# Patient Record
Sex: Female | Born: 1935 | Race: Black or African American | Hispanic: No | Smoking: Never smoker
Health system: Southern US, Community
[De-identification: ages and names within clinical notes are randomized; demographics above are authoritative.]

## PROBLEM LIST (undated history)

## (undated) DIAGNOSIS — I4891 Unspecified atrial fibrillation: Principal | ICD-10-CM

## (undated) DIAGNOSIS — I10 Essential (primary) hypertension: Secondary | ICD-10-CM

## (undated) DIAGNOSIS — E7800 Pure hypercholesterolemia, unspecified: Secondary | ICD-10-CM

## (undated) DIAGNOSIS — H409 Unspecified glaucoma: Secondary | ICD-10-CM

## (undated) DIAGNOSIS — I739 Peripheral vascular disease, unspecified: Secondary | ICD-10-CM

## (undated) DIAGNOSIS — I5022 Chronic systolic (congestive) heart failure: Secondary | ICD-10-CM

## (undated) DIAGNOSIS — E119 Type 2 diabetes mellitus without complications: Secondary | ICD-10-CM

## (undated) HISTORY — PX: EYE SURGERY: SHX253

## (undated) MED ORDER — ZOLEDRONIC ACID 5 MG/100ML IV SOLN
5.0000 mg | Freq: Once | INTRAVENOUS | Status: AC
Start: 2022-11-11 — End: 2022-11-11

## (undated) MED ORDER — SODIUM CHLORIDE 0.9 % IV BOLUS
250.0000 mL | INJECTION | Freq: Once | INTRAVENOUS | Status: AC
Start: 2022-11-11 — End: 2022-11-11

---

## 2012-07-04 ENCOUNTER — Encounter (INDEPENDENT_AMBULATORY_CARE_PROVIDER_SITE_OTHER): Payer: Medicare Other | Admitting: Ophthalmology

## 2012-07-04 DIAGNOSIS — H4010X0 Unspecified open-angle glaucoma, stage unspecified: Secondary | ICD-10-CM

## 2012-07-04 DIAGNOSIS — E1165 Type 2 diabetes mellitus with hyperglycemia: Secondary | ICD-10-CM

## 2012-07-04 DIAGNOSIS — E11319 Type 2 diabetes mellitus with unspecified diabetic retinopathy without macular edema: Secondary | ICD-10-CM

## 2012-07-04 DIAGNOSIS — H43819 Vitreous degeneration, unspecified eye: Secondary | ICD-10-CM

## 2012-07-04 DIAGNOSIS — H3581 Retinal edema: Secondary | ICD-10-CM

## 2012-07-04 DIAGNOSIS — I10 Essential (primary) hypertension: Secondary | ICD-10-CM

## 2012-07-04 DIAGNOSIS — H35039 Hypertensive retinopathy, unspecified eye: Secondary | ICD-10-CM

## 2012-11-01 ENCOUNTER — Ambulatory Visit (INDEPENDENT_AMBULATORY_CARE_PROVIDER_SITE_OTHER): Payer: Self-pay | Admitting: Ophthalmology

## 2013-03-30 ENCOUNTER — Emergency Department (HOSPITAL_COMMUNITY): Payer: Medicare Other

## 2013-03-30 ENCOUNTER — Inpatient Hospital Stay (HOSPITAL_COMMUNITY)
Admission: EM | Admit: 2013-03-30 | Discharge: 2013-04-04 | DRG: 308 | Disposition: A | Discharge status: Home Health | Payer: Medicare Other | Attending: Cardiology | Admitting: Cardiology

## 2013-03-30 ENCOUNTER — Encounter (HOSPITAL_COMMUNITY): Payer: Self-pay | Admitting: Emergency Medicine

## 2013-03-30 DIAGNOSIS — I4729 Other ventricular tachycardia: Secondary | ICD-10-CM | POA: Diagnosis present

## 2013-03-30 DIAGNOSIS — I4891 Unspecified atrial fibrillation: Principal | ICD-10-CM | POA: Diagnosis present

## 2013-03-30 DIAGNOSIS — Z87891 Personal history of nicotine dependence: Secondary | ICD-10-CM

## 2013-03-30 DIAGNOSIS — I509 Heart failure, unspecified: Secondary | ICD-10-CM | POA: Diagnosis present

## 2013-03-30 DIAGNOSIS — Z794 Long term (current) use of insulin: Secondary | ICD-10-CM

## 2013-03-30 DIAGNOSIS — E119 Type 2 diabetes mellitus without complications: Secondary | ICD-10-CM | POA: Diagnosis present

## 2013-03-30 DIAGNOSIS — I472 Ventricular tachycardia, unspecified: Secondary | ICD-10-CM | POA: Diagnosis present

## 2013-03-30 DIAGNOSIS — E876 Hypokalemia: Secondary | ICD-10-CM | POA: Diagnosis not present

## 2013-03-30 DIAGNOSIS — I5021 Acute systolic (congestive) heart failure: Secondary | ICD-10-CM

## 2013-03-30 DIAGNOSIS — I429 Cardiomyopathy, unspecified: Secondary | ICD-10-CM

## 2013-03-30 DIAGNOSIS — I10 Essential (primary) hypertension: Secondary | ICD-10-CM | POA: Diagnosis present

## 2013-03-30 DIAGNOSIS — Z79899 Other long term (current) drug therapy: Secondary | ICD-10-CM

## 2013-03-30 DIAGNOSIS — I428 Other cardiomyopathies: Secondary | ICD-10-CM | POA: Diagnosis present

## 2013-03-30 DIAGNOSIS — E785 Hyperlipidemia, unspecified: Secondary | ICD-10-CM | POA: Diagnosis present

## 2013-03-30 DIAGNOSIS — I739 Peripheral vascular disease, unspecified: Secondary | ICD-10-CM | POA: Diagnosis present

## 2013-03-30 DIAGNOSIS — H409 Unspecified glaucoma: Secondary | ICD-10-CM | POA: Diagnosis present

## 2013-03-30 DIAGNOSIS — I4821 Permanent atrial fibrillation: Secondary | ICD-10-CM

## 2013-03-30 HISTORY — DX: Peripheral vascular disease, unspecified: I73.9

## 2013-03-30 HISTORY — DX: Pure hypercholesterolemia, unspecified: E78.00

## 2013-03-30 HISTORY — DX: Unspecified atrial fibrillation: I48.91

## 2013-03-30 HISTORY — DX: Unspecified glaucoma: H40.9

## 2013-03-30 HISTORY — DX: Essential (primary) hypertension: I10

## 2013-03-30 HISTORY — DX: Type 2 diabetes mellitus without complications: E11.9

## 2013-03-30 HISTORY — DX: Chronic systolic (congestive) heart failure: I50.22

## 2013-03-30 LAB — CBC
HCT: 32.6 % — ABNORMAL LOW (ref 36.0–46.0)
MCH: 26.8 pg (ref 26.0–34.0)
MCV: 80.9 fL (ref 78.0–100.0)
Platelets: 213 10*3/uL (ref 150–400)
RBC: 4.03 MIL/uL (ref 3.87–5.11)
WBC: 4.3 10*3/uL (ref 4.0–10.5)

## 2013-03-30 LAB — HEPARIN LEVEL (UNFRACTIONATED): Heparin Unfractionated: 0.51 IU/mL (ref 0.30–0.70)

## 2013-03-30 LAB — TROPONIN I
Troponin I: <0.3 ng/mL (ref <0.30)
Troponin I: <0.3 ng/mL (ref <0.30)

## 2013-03-30 LAB — MAGNESIUM: Magnesium: 0.9 mg/dL — CL (ref 1.5–2.5)

## 2013-03-30 LAB — COMPREHENSIVE METABOLIC PANEL
ALT: 46 U/L — ABNORMAL HIGH (ref 0–35)
AST: 28 U/L (ref 0–37)
Albumin: 3.3 g/dL — ABNORMAL LOW (ref 3.5–5.2)
BUN: 22 mg/dL (ref 6–23)
CO2: 19 mEq/L (ref 19–32)
Calcium: 8.4 mg/dL (ref 8.4–10.5)
Creatinine, Ser: 1.1 mg/dL (ref 0.50–1.10)
GFR calc Af Amer: 53 mL/min — ABNORMAL LOW (ref >90)
GFR calc non Af Amer: 45 mL/min — ABNORMAL LOW (ref >90)
Sodium: 143 mEq/L (ref 135–145)
Total Bilirubin: 0.6 mg/dL (ref 0.3–1.2)
Total Protein: 6.3 g/dL (ref 6.0–8.3)

## 2013-03-30 LAB — GLUCOSE, CAPILLARY: Glucose-Capillary: 110 mg/dL — ABNORMAL HIGH (ref 70–99)

## 2013-03-30 LAB — POCT I-STAT TROPONIN I: Troponin i, poc: 0.01 ng/mL (ref 0.00–0.08)

## 2013-03-30 LAB — PROTIME-INR: INR: 1.1 (ref 0.00–1.49)

## 2013-03-30 LAB — TSH: TSH: 1.19 u[IU]/mL (ref 0.350–4.500)

## 2013-03-30 MED ORDER — ASPIRIN EC 81 MG PO TBEC
81.0000 mg | DELAYED_RELEASE_TABLET | Freq: Every day | ORAL | Status: DC
  Administered 2013-03-30: 17:00:00 81 mg via ORAL
  Filled 2013-03-30 (×3): qty 1

## 2013-03-30 MED ORDER — DEXTROSE 5 % IV SOLN
5.0000 mg/h | INTRAVENOUS | Status: DC
  Administered 2013-03-30: 5 mg/h via INTRAVENOUS
  Filled 2013-03-30: qty 100

## 2013-03-30 MED ORDER — METOPROLOL TARTRATE 50 MG PO TABS
50.0000 mg | ORAL_TABLET | Freq: Two times a day (BID) | ORAL | Status: DC
  Administered 2013-03-30 (×2): 50 mg via ORAL
  Filled 2013-03-30 (×4): qty 1

## 2013-03-30 MED ORDER — FUROSEMIDE 10 MG/ML IJ SOLN
80.0000 mg | Freq: Two times a day (BID) | INTRAMUSCULAR | Status: DC
  Administered 2013-03-30: 80 mg via INTRAVENOUS
  Filled 2013-03-30: qty 8

## 2013-03-30 MED ORDER — LISINOPRIL 20 MG PO TABS
20.0000 mg | ORAL_TABLET | Freq: Every day | ORAL | Status: DC
  Administered 2013-03-30 – 2013-04-04 (×6): 20 mg via ORAL
  Filled 2013-03-30 (×6): qty 1

## 2013-03-30 MED ORDER — ACETAMINOPHEN 325 MG PO TABS: 650.0000 mg | ORAL_TABLET | ORAL | Status: DC | PRN

## 2013-03-30 MED ORDER — HEPARIN BOLUS VIA INFUSION
3200.0000 [IU] | Freq: Once | INTRAVENOUS | Status: AC
  Administered 2013-03-30: 3200 [IU] via INTRAVENOUS
  Filled 2013-03-30: qty 3200

## 2013-03-30 MED ORDER — SIMVASTATIN 20 MG PO TABS
20.0000 mg | ORAL_TABLET | Freq: Every day | ORAL | Status: DC
  Administered 2013-03-30 – 2013-04-03 (×5): 20 mg via ORAL
  Filled 2013-03-30 (×6): qty 1

## 2013-03-30 MED ORDER — GLIMEPIRIDE 1 MG PO TABS
1.0000 mg | ORAL_TABLET | Freq: Every day | ORAL | Status: DC
  Administered 2013-03-31 – 2013-04-04 (×4): 1 mg via ORAL
  Filled 2013-03-30 (×6): qty 1

## 2013-03-30 MED ORDER — FUROSEMIDE 10 MG/ML IJ SOLN
40.0000 mg | Freq: Three times a day (TID) | INTRAMUSCULAR | Status: DC
  Administered 2013-03-30 – 2013-03-31 (×3): 40 mg via INTRAVENOUS
  Filled 2013-03-30 (×6): qty 4

## 2013-03-30 MED ORDER — METOPROLOL TARTRATE 1 MG/ML IV SOLN
2.5000 mg | Freq: Once | INTRAVENOUS | Status: AC
  Administered 2013-03-30: 2.5 mg via INTRAVENOUS
  Filled 2013-03-30: qty 5

## 2013-03-30 MED ORDER — LATANOPROST 0.005 % OP SOLN
1.0000 [drp] | Freq: Every day | OPHTHALMIC | Status: DC
  Administered 2013-03-30 – 2013-04-03 (×4): 1 [drp] via OPHTHALMIC
  Filled 2013-03-30 (×2): qty 2.5

## 2013-03-30 MED ORDER — ASPIRIN 325 MG PO TABS
325.0000 mg | ORAL_TABLET | ORAL | Status: AC
  Administered 2013-03-30: 325 mg via ORAL
  Filled 2013-03-30: qty 1

## 2013-03-30 MED ORDER — INSULIN ASPART 100 UNIT/ML ~~LOC~~ SOLN
0.0000 [IU] | Freq: Three times a day (TID) | SUBCUTANEOUS | Status: DC
Start: 2013-03-30 — End: 2013-04-04
  Administered 2013-03-31: 09:00:00 2 [IU] via SUBCUTANEOUS
  Administered 2013-03-31: 3 [IU] via SUBCUTANEOUS
  Administered 2013-04-01: 09:00:00 2 [IU] via SUBCUTANEOUS
  Administered 2013-04-01: 3 [IU] via SUBCUTANEOUS
  Administered 2013-04-02: 12:00:00 2 [IU] via SUBCUTANEOUS
  Administered 2013-04-03 – 2013-04-04 (×2): 3 [IU] via SUBCUTANEOUS

## 2013-03-30 MED ORDER — HEPARIN (PORCINE) IN NACL 100-0.45 UNIT/ML-% IJ SOLN
700.0000 [IU]/h | INTRAMUSCULAR | Status: AC
  Administered 2013-03-30: 15:00:00 900 [IU]/h via INTRAVENOUS
  Administered 2013-03-31: 12:00:00 800 [IU]/h via INTRAVENOUS
  Administered 2013-04-01: 700 [IU]/h via INTRAVENOUS
  Filled 2013-03-30 (×3): qty 250

## 2013-03-30 MED ORDER — ONDANSETRON HCL 4 MG/2ML IJ SOLN: 4.0000 mg | Freq: Four times a day (QID) | INTRAMUSCULAR | Status: DC | PRN

## 2013-03-30 NOTE — Progress Notes (Signed)
Patient's Diltiazem drip was stopped by ED nurse; PA notified and orders given to restart drip and monitor patient, patient is currently still in atrial fibrillation; will continue to monitor patient. Ruben Im RN

## 2013-03-30 NOTE — ED Notes (Signed)
Cardiology at the bedside.

## 2013-03-30 NOTE — H&P (Signed)
Patient ID: Patricia Randall MRN: [id number], DOB/AGE: 10/15/1928   Admit date: 03/30/2013  Primary Physician: Dr. Arelia Sneddon Primary Cardiologist: new - seen by Einar Crow, MD   Pt. Profile:  79 year old female without prior cardiac history who presented to the ED today with progressive dyspnea and orthopnea has been found to be in atrial fibrillation.  Problem List  Past Medical History  Diagnosis Date  . Diabetes mellitus without complication   . Hypertension   . High cholesterol   . Claudication   . Glaucoma     Past Surgical History  Procedure Laterality Date  . Eye surgery       Allergies  No Known Allergies  HPI  78 year old female without prior cardiac history. Risk factors include advanced age, diabetes, hypertension, and hyperlipidemia. She reports that since the late spring to early summer, she's been experiencing bilateral leg fatigue with walking. She denies pain or burning but has had to change her walking routine secondary to progressive fatigue in her legs. Over the past one to 2 months, she is also been experiencing progressive dyspnea on exertion and over the past 2 weeks, this is been occurring with minimal activity. Also over the past 2 weeks, she has been noting nightly orthopnea , PND, mild lower extremity edema, and orthopnea. Because of these symptoms, she was seen in her primary care provider's office yesterday. Apparently a chest x-ray was performed and she was told she had heart failure as well as cardiomegaly and her home dose of HCTZ was discontinued in favor of furosemide. Last night, she was restless throughout the night secondary to orthopnea and as result came in to the ED this morning. Here, she was found to be in atrial fibrillation with rates in the 1 teens. ProBNP is elevated at 4714 and chest x-ray shows pulmonary edema. She is currently comfortable. She denies ever experiencing chest pain/tightness/pressure, palpitations, presyncope, or syncope.  She  denies noticeable weight gain does not weigh herself regularly. Her close of not having any more tightly.  Home Medications  Prior to Admission medications   Medication Sig Start Date End Date Taking? Authorizing Provider  atenolol (TENORMIN) 100 MG tablet Take 100 mg by mouth daily.   Yes Historical Provider, MD  furosemide (LASIX) 40 MG tablet Take 40 mg by mouth daily.   Yes Historical Provider, MD  glimepiride (AMARYL) 1 MG tablet Take 1 mg by mouth daily before breakfast.   Yes Historical Provider, MD  hydrochlorothiazide (HYDRODIURIL) 25 MG tablet Take 25 mg by mouth daily.   Yes Historical Provider, MD  latanoprost (XALATAN) 0.005 % ophthalmic solution Place 1 drop into both eyes at bedtime.   Yes Historical Provider, MD  lisinopril (PRINIVIL,ZESTRIL) 20 MG tablet Take 20 mg by mouth daily.   Yes Historical Provider, MD  lovastatin (MEVACOR) 20 MG tablet Take 40 mg by mouth at bedtime.   Yes Historical Provider, MD  metFORMIN (GLUCOPHAGE) 1000 MG tablet Take 500-1,000 mg by mouth 2 (two) times daily with a meal. Take 1 tablet every morning and take 1/2 tablet at supper   Yes Historical Provider, MD   Family History  Family History  Problem Relation Age of Onset  . Other Mother     died in her 7's - 'just got sick.'  . Other Father     died @ 32, unknown cause.  . Diabetes Brother   . Other      12 siblings + 7 more 1/2 siblings.   Social History  History   Social History  . Marital Status: Widowed    Spouse Name: N/A    Number of Children: N/A  . Years of Education: N/A   Occupational History  . Not on file.   Social History Main Topics  . Smoking status: Never Smoker   . Smokeless tobacco: Never Used  . Alcohol Use: No  . Drug Use: No  . Sexual Activity: Not on file   Other Topics Concern  . Not on file   Social History Narrative   Lives in [place] with her son.    Review of Systems General:  No chills, fever, night sweats or weight changes.    Cardiovascular:  No chest pain or palpitations, +++ dyspnea on exertion, +++ edema, +++ orthopnea, +++ paroxysmal nocturnal dyspnea. Dermatological: No rash, lesions/masses Respiratory: No cough,+++ dyspnea Urologic: No hematuria, dysuria Abdominal:   No nausea, vomiting, diarrhea, bright red blood per rectum, melena, or hematemesis Neurologic:  No visual changes, wkns, changes in mental status. All other systems reviewed and are otherwise negative except as noted above.  Physical Exam  Blood pressure 132/87, pulse 97, temperature 97.9 F (36.6 C), resp. rate 27, height 5\' 6"  (1.676 m), weight 141 lb (63.957 kg), SpO2 99.00%.  General: Pleasant, NAD Psych: Normal affect. Neuro: Alert and oriented X 3. Moves all extremities spontaneously. HEENT: Normal  Neck: Supple without bruits.  JVP to ear. Lungs:  Resp regular and unlabored, bibasilar crackles. Heart: ir ir no s3, s4, or murmurs. Abdomen: Soft, non-tender, non-distended, BS + x 4.  Extremities: No clubbing, cyanosis.  Trace to 1+ bilat LE edema. DP/PT/Radials 2+ and equal bilaterally.  Labs   Recent Labs  03/30/13 0735  TROPONINI <0.30   Lab Results  Component Value Date   WBC 4.3 03/30/2013   HGB 10.8* 03/30/2013   HCT 32.6* 03/30/2013   MCV 80.9 03/30/2013   PLT 213 03/30/2013   Radiology/Studies  Dg Chest Port 1 View  03/30/2013   CLINICAL DATA:  Short of breath  EXAM: PORTABLE CHEST - 1 VIEW  COMPARISON:  None.  FINDINGS: The cardiac silhouette is enlarged. The mediastinum is normal in contour. No hilar masses.  There is mild central vascular congestion. Coarse reticular opacities at the bases is likely due to atelectasis. No convincing infiltrate. There may be a small left effusion.  No pneumothorax.  IMPRESSION: Cardiomegaly. Vascular congestion without overt pulmonary edema. Mild lung base opacity likely atelectasis. Possible small left effusion. Mild congestive heart failure is suspected.   Electronically  Signed   By: Lajean Manes M.D.   On: 03/30/2013 08:04   ECG  Coarse afib, 112, lad, poor r prog, lvh with repol abnl (st dep V5-6). - no old ecg for comparison.  ASSESSMENT AND PLAN  1.  Acute congestive heart failure: Question systolic versus diastolic. She's been having progressive dyspnea for several weeks with orthopnea, PND, lower extremity edema, and early satiety. She was not told yesterday at her PCPs office that she was tachycardic, irregular, more in A. fib. Question duration and contribution to current symptoms. We'll admit and diuresis. Check 2-D echocardiogram. We'll have IV diltiazem for rate control and switch her home dose of beta blocker from atenolol to metoprolol. Check TSH. Electrolytes pending. His LV function is reduced or troponin elevates, will pursue ischemic evaluation.  2. Atrial fibrillation with rapid ventricular response: Duration unknown though may have come on after her primary care providers visit yesterday. Will initiate rate control with IV diltiazem and continue  beta blocker therapy. Suspect that we will be able to rate control her, thus we may be able to defer cardioversion (provided that she doesn't convert on her own) until after 3 wks of therapeutic anticoagulation.  Will add IV heparin for the time being as it is not clear at this point if she will require invasive cardiac evaluation. She has a CHADS2 of 4 and a CHA2DS2VASc of 6, thus she will need longterm anticoagulation.  She has no bleeding history.  She will likely be a candidate for eliquis and we can initiate this either once it's clear that she doesn't require invasive eval, or after invasive eval.   3.  HTN:  BP elevated in ED.  Follow on IV dilt.  Cont bb/acei.  4.  HL:  Cont statin.  5.  Claudication:  Pt reports a several month h/o progressive exertional leg fatigue w/o pain.  She has good distal pulses.  Will plan outpt ABI's.  6.  Glaucoma:  Cont eye drops.  Signed, Murray Hodgkins,  NP 03/30/2013, 11:19 AM  Patient seen with NP, agree with the above note.  1. Acute CHF: Suspect triggered by atrial fibrillation.  She will need echo today.  Will control rate and diurese with Lasix 40 mg IV every 8 hrs.  Follow BMET closely.  If EF is down or troponin elevates, would consider ischemic evaluation with cardiac cath.  2. Atrial fibrillation: Of uncertain duration.  Given symptoms, could have been for a few weeks.  Mild RVR.  Suspect that atrial fibrillation may be the trigger for her CHF.  - Heparin gtt for now in case cardiac cath is needed. If EF is normal and negative troponin, would stop heparin gtt and use Eliquis or Xarelto.  - HR control for now with diltiazem gtt, will also stop atenolol and use metoprolol instead (can transition to Toprol XL eventually).  - Given CHF triggered by atrial fibrillation, would favor early TEE-guided DCCV.  If cardiac cath is needed, this should be afterwards.   [name] 03/30/2013

## 2013-03-30 NOTE — Progress Notes (Signed)
Patient report received from ED nurse, patient has arrived to unit and CMT notified; will assess and continue to monitor patient. Ruben Im RN

## 2013-03-30 NOTE — Progress Notes (Signed)
ANTICOAGULATION CONSULT NOTE - Initial Consult  Pharmacy Consult for heparin Indication: atrial fibrillation  No Known Allergies  Patient Measurements: Height: 5\' 6"  (167.6 cm) Weight: 141 lb (63.957 kg) IBW/kg (Calculated) : 59.3 Heparin Dosing Weight: 64kg  Vital Signs: Temp: 97.9 F (36.6 C) (10/10 0708) BP: 134/99 mmHg (10/10 1130) Pulse Rate: 114 (10/10 1130)  Labs:  Recent Labs  03/30/13 0735  HGB 10.8*  HCT 32.6*  PLT 213  LABPROT 14.0  INR 1.10  TROPONINI <0.30    CrCl is unknown because no creatinine reading has been taken.   Medical History: Past Medical History  Diagnosis Date  . Diabetes mellitus without complication   . Hypertension   . High cholesterol   . Claudication   . Glaucoma     Medications:  Infusions:  . diltiazem (CARDIZEM) infusion    . heparin    . heparin      Assessment: 35 yof presented to the ED with SOB x 2 weeks. To start IV heparin for afib. Baseline H/H is slightly low at 10.8/32.6 and plts are WNL at 213. She is not on any anticoagulation PTA and INR is WNL.   Goal of Therapy:  Heparin level 0.3-0.7 units/ml Monitor platelets by anticoagulation protocol: Yes   Plan:  1. Heparin bolus 3200 units IV x 1 2. Heparin gtt 900 units/hr 3. Check an 8 hour heparin level 4. Daily heparin level and CBC 5. F/u plans for oral anticoag  Patricia Randall, Rande Lawman 03/30/2013,11:34 AM

## 2013-03-30 NOTE — ED Notes (Signed)
Xray at the bedside.

## 2013-03-30 NOTE — ED Provider Notes (Signed)
CSN: [id number]     Arrival date & time 03/30/13  [id number] History   First MD Initiated Contact with Patient 03/30/13 0654     Chief Complaint  Patient presents with  . Shortness of Breath   (Consider location/radiation/quality/duration/timing/severity/associated sxs/prior Treatment) HPI Comments: 79 year old aggregate female presents emergency bar with chief complaint of shortness of breath. Onset of symptoms was approximately 2 weeks ago. Symptoms are worsened by activity or exertion. Patient was seen by her primary care provider yesterday and her Lasix dose was increased.    Patient denies fever, chills, chest pain, abdominal pain, nausea vomiting diarrhea, neurologic symptoms to include slurred speech facial droop paralysis, she denies recent infection.  She has no known drug allergies, she takes medications for diabetes and hypertension. Patient denies history of atrial fibrillation or abnormal heart rate. She denies history of heart attack. Patient has never been seen in this facility and has limited past medical history in the system.  Patient is a 79 y.o. female presenting with shortness of breath. The history is provided by the patient and the spouse.  Shortness of Breath Severity:  Mild Onset quality:  Gradual Duration:  2 weeks Timing:  Constant Progression:  Waxing and waning Chronicity:  Recurrent Context: activity   Context: not animal exposure, not emotional upset, not fumes, not known allergens, not occupational exposure, not pollens, not smoke exposure, not strong odors, not URI and not weather changes   Relieved by:  Diuretics Worsened by:  Activity Associated symptoms: no abdominal pain, no chest pain, no claudication, no cough, no diaphoresis, no fever, no headaches, no hemoptysis, no neck pain, no PND, no sore throat, no sputum production, no syncope, no swollen glands, no vomiting and no wheezing     Past Medical History  Diagnosis Date  . Diabetes mellitus without  complication   . Hypertension   . High cholesterol    Past Surgical History  Procedure Laterality Date  . Eye surgery     No family history on file. History  Substance Use Topics  . Smoking status: Former Research scientist (life sciences)  . Smokeless tobacco: Never Used  . Alcohol Use: No   OB History   Grav Para Term Preterm Abortions TAB SAB Ect Mult Living                 Review of Systems  Constitutional: Positive for activity change and fatigue. Negative for fever, chills, diaphoresis, appetite change and unexpected weight change.  HENT: Negative.  Negative for sore throat.   Eyes: Negative.   Respiratory: Positive for shortness of breath. Negative for apnea, cough, hemoptysis, sputum production, choking, chest tightness, wheezing and stridor.   Cardiovascular: Negative for chest pain, palpitations, claudication, leg swelling, syncope and PND.  Gastrointestinal: Negative.  Negative for nausea, vomiting, abdominal pain, diarrhea and constipation.  Endocrine: Negative.   Genitourinary: Negative.   Musculoskeletal: Negative.  Negative for neck pain.  Neurological: Negative for dizziness, tremors, seizures, syncope, speech difficulty and headaches.  Hematological: Negative.   Psychiatric/Behavioral: Negative.     Allergies  Review of patient's allergies indicates no known allergies.  Home Medications   Current Outpatient Rx  Name  Route  Sig  Dispense  Refill  . atenolol (TENORMIN) 100 MG tablet   Oral   Take 100 mg by mouth daily.         . furosemide (LASIX) 40 MG tablet   Oral   Take 40 mg by mouth daily.         Marland Kitchen  glimepiride (AMARYL) 1 MG tablet   Oral   Take 1 mg by mouth daily before breakfast.         . hydrochlorothiazide (HYDRODIURIL) 25 MG tablet   Oral   Take 25 mg by mouth daily.         Marland Kitchen latanoprost (XALATAN) 0.005 % ophthalmic solution   Both Eyes   Place 1 drop into both eyes at bedtime.         Marland Kitchen lisinopril (PRINIVIL,ZESTRIL) 20 MG tablet   Oral    Take 20 mg by mouth daily.         Marland Kitchen lovastatin (MEVACOR) 20 MG tablet   Oral   Take 40 mg by mouth at bedtime.         . metFORMIN (GLUCOPHAGE) 1000 MG tablet   Oral   Take 500-1,000 mg by mouth 2 (two) times daily with a meal. Take 1 tablet every morning and take 1/2 tablet at supper          BP 134/84  Pulse 102  Temp(Src) 97.9 F (36.6 C)  Resp 15  Ht 5\' 6"  (1.676 m)  Wt 141 lb (63.957 kg)  BMI 22.77 kg/m2  SpO2 100% Physical Exam  Constitutional: She is oriented to person, place, and time. She appears well-developed and well-nourished. No distress.  HENT:  Head: Normocephalic and atraumatic.  Eyes: Conjunctivae are normal. Right eye exhibits no discharge. Left eye exhibits no discharge.  Neck: Normal range of motion. No JVD present. No tracheal deviation present. No thyromegaly present.  Cardiovascular: An irregular rhythm present.  Extrasystoles are present. Tachycardia present.  PMI is not displaced.  Exam reveals no S3.   Murmur heard.  Systolic murmur is present with a grade of 2/6  Pulmonary/Chest: Effort normal and breath sounds normal. No stridor. No respiratory distress. She has no wheezes. She has no rales. She exhibits no tenderness.  Abdominal: Soft. Bowel sounds are normal. She exhibits no distension and no mass. There is no tenderness. There is no rebound and no guarding.  Musculoskeletal: Normal range of motion. She exhibits no edema and no tenderness.  No clubbing cyanosis, minimal lower extremity edema, moving all extremities, no tenderness to palpation lower extremities, negative Homans  Neurological: She is alert and oriented to person, place, and time. She has normal reflexes. No cranial nerve deficit. Coordination normal.  Skin: Skin is warm and dry. She is not diaphoretic.    ED Course  Procedures (including critical care time) Labs Review Labs Reviewed  CBC - Abnormal; Notable for the following:    Hemoglobin 10.8 (*)    HCT 32.6 (*)    RDW  17.7 (*)    All other components within normal limits  PRO B NATRIURETIC PEPTIDE  TROPONIN I  PROTIME-INR  POCT I-STAT TROPONIN I   Imaging Review Dg Chest Port 1 View  03/30/2013   CLINICAL DATA:  Short of breath  EXAM: PORTABLE CHEST - 1 VIEW  COMPARISON:  None.  FINDINGS: The cardiac silhouette is enlarged. The mediastinum is normal in contour. No hilar masses.  There is mild central vascular congestion. Coarse reticular opacities at the bases is likely due to atelectasis. No convincing infiltrate. There may be a small left effusion.  No pneumothorax.  IMPRESSION: Cardiomegaly. Vascular congestion without overt pulmonary edema. Mild lung base opacity likely atelectasis. Possible small left effusion. Mild congestive heart failure is suspected.   Electronically Signed   By: Lajean Manes M.D.   On: 03/30/2013 08:04  EKG Interpretation   None       Date: 03/30/2013  Rate: 112  Rhythm: atrial fibrillation  QRS Axis: right  Intervals: normal  ST/T Wave abnormalities: normal  Conduction Disutrbances:left anterior fascicular block  Narrative Interpretation: Atrial fibrillation with ventricular rate varying from 90s to 120s, no clear signs of ischemia.  Old EKG Reviewed: none available  CRITICAL CARE Performed by: Curly Rim, DAVID   Total critical care time: 37min  Critical care time was exclusive of separately billable procedures and treating other patients.  Critical care was necessary to treat or prevent imminent or life-threatening deterioration.  Critical care was time spent personally by me on the following activities: development of treatment plan with patient and/or surrogate as well as nursing, discussions with consultants, evaluation of patient's response to treatment, examination of patient, obtaining history from patient or surrogate, ordering and performing treatments and interventions, ordering and review of laboratory studies, ordering and review of radiographic  studies, pulse oximetry and re-evaluation of patient's condition.  MDM  No diagnosis found. 79 year old female presents emergency department with chief complaint of shortness of breath x2 weeks. Patient has a past medical history significant for diabetes mellitus non-insulin-dependent, hypertension, and questionable CHF. Patient is resting comfortably in the bed, has no increased work of breathing, has clear lungs on auscultation. Stat EKG obtained revealing atrial fibrillation. Plan to provide full dose aspirin and perform cardiac workup.     New Onset Atrial Fibrillation Shortness of breath  CHADS score 6; Pt will need c/s to cardiology and likely anticoagulation.  Given ASA in ER.  HR in 90s-110s.  Will give one time dose of lopressor 5mg .    8:20 AM Istat trop negative.  CXR with cardiomegaly w/o overt pulmonary edema.  Consult placed to cardiology. Spoke with cardiology at 8:28 AM who will evaluate in ER.  Hold on anticoagulation until cards evaluates.    1100 - cardiology at bedside, pt in nad  Admit to cardiology.  VSS.  No issues in ER.    Elmer Sow, MD 03/30/13 2216

## 2013-03-30 NOTE — ED Notes (Signed)
Patient presents to ED via POV. Patient states that she has been "short of breath" for about 2 weeks. Shortness of breath is worse with exertion. Pt states that she went to her PCP yesterday for the same symptoms and her PCP told her "its my heart, not my lungs, but I still feel short of breath." Upon arrival to ED patient is A&Ox4. O2 is 99% on RA.

## 2013-03-30 NOTE — Progress Notes (Signed)
Utilization Review Completed.   Lavanda Nevels, RN, BSN Nurse Case Manager  [phone]  

## 2013-03-30 NOTE — Progress Notes (Signed)
Patient education discussed with patient concerning new onset Heart Failure, diet, medication and self care reviewed with patient.  Heart failure booklet given to patient as well as atrial fibrillation handout; will continue to monitor and answer any further questions the patient may have. Ruben Im RN

## 2013-03-30 NOTE — Progress Notes (Signed)
ANTICOAGULATION CONSULT NOTE - Follow up  Pharmacy Consult for heparin Indication: atrial fibrillation  No Known Allergies  Patient Measurements: Height: 5\' 5"  (165.1 cm) Weight: 136 lb 14.5 oz (62.1 kg) IBW/kg (Calculated) : 57 Heparin Dosing Weight: 62kg  Vital Signs: Temp: 97 F (36.1 C) (10/10 2002) Temp src: Oral (10/10 2002) BP: 111/86 mmHg (10/10 2002) Pulse Rate: 78 (10/10 2002)  Labs:  Recent Labs  03/30/13 0735 03/30/13 1218 03/30/13 1824 03/30/13 2000  HGB 10.8*  --   --   --   HCT 32.6*  --   --   --   PLT 213  --   --   --   LABPROT 14.0  --   --   --   INR 1.10  --   --   --   HEPARINUNFRC  --   --   --  0.51  CREATININE  --  1.10  --   --   TROPONINI <0.30 <0.30 <0.30  --     Estimated Creatinine Clearance: 35.5 ml/min (by C-G formula based on Cr of 1.1).   Medical History: Past Medical History  Diagnosis Date  . Hypertension   . High cholesterol   . Claudication   . Glaucoma   . Shortness of breath     "got so it was all the time last couple weekst" (03/30/2013)  . Type II diabetes mellitus   . CHF (congestive heart failure)     acute onset/notes 03/30/2013  . Atrial fibrillation with rapid ventricular response     /notes 03/30/2013    Medications:  Infusions:  . diltiazem (CARDIZEM) infusion 5 mg/hr (03/30/13 1157)  . heparin 900 Units/hr (03/30/13 1458)    Assessment: The 8 hr heparin level is 0.51, therapeutic on IV heparin infusion 900 units/hr for afib in this 79 yo female.  She was not on any anticoagulation PTA and INR is WNL. No bleeding reported.   Goal of Therapy:  Heparin level 0.3-0.7 units/ml Monitor platelets by anticoagulation protocol: Yes   Plan:  Continue IV Heparin gtt 900 units/hr Daily heparin level and CBC F/u plans for oral anticoag  Nicole Cella, RPh Clinical Pharmacist Pager: [phone] 03/30/2013,9:32 PM

## 2013-03-31 ENCOUNTER — Encounter (HOSPITAL_COMMUNITY): Payer: Self-pay | Admitting: Internal Medicine

## 2013-03-31 DIAGNOSIS — I4821 Permanent atrial fibrillation: Secondary | ICD-10-CM

## 2013-03-31 LAB — BASIC METABOLIC PANEL
CO2: 24 mEq/L (ref 19–32)
Calcium: 8.8 mg/dL (ref 8.4–10.5)
Creatinine, Ser: 1.16 mg/dL — ABNORMAL HIGH (ref 0.50–1.10)
Glucose, Bld: 150 mg/dL — ABNORMAL HIGH (ref 70–99)
Sodium: 144 mEq/L (ref 135–145)

## 2013-03-31 LAB — GLUCOSE, CAPILLARY: Glucose-Capillary: 162 mg/dL — ABNORMAL HIGH (ref 70–99)

## 2013-03-31 LAB — LIPID PANEL
Cholesterol: 126 mg/dL (ref 0–200)
HDL: 62 mg/dL (ref >39)
Triglycerides: 64 mg/dL (ref <150)

## 2013-03-31 LAB — CBC
MCV: 78.5 fL (ref 78.0–100.0)
Platelets: 244 10*3/uL (ref 150–400)
RDW: 17.1 % — ABNORMAL HIGH (ref 11.5–15.5)
WBC: 5.4 10*3/uL (ref 4.0–10.5)

## 2013-03-31 LAB — HEPARIN LEVEL (UNFRACTIONATED)
Heparin Unfractionated: 0.71 IU/mL — ABNORMAL HIGH (ref 0.30–0.70)
Heparin Unfractionated: 0.47 IU/mL (ref 0.30–0.70)

## 2013-03-31 MED ORDER — METOPROLOL TARTRATE 50 MG PO TABS
50.0000 mg | ORAL_TABLET | Freq: Three times a day (TID) | ORAL | Status: DC
  Administered 2013-03-31 – 2013-04-02 (×6): 50 mg via ORAL
  Filled 2013-03-31 (×9): qty 1

## 2013-03-31 MED ORDER — FUROSEMIDE 10 MG/ML IJ SOLN
40.0000 mg | Freq: Two times a day (BID) | INTRAMUSCULAR | Status: DC
  Administered 2013-03-31 – 2013-04-01 (×4): 40 mg via INTRAVENOUS
  Filled 2013-03-31 (×4): qty 4

## 2013-03-31 NOTE — Progress Notes (Signed)
ANTICOAGULATION CONSULT NOTE - Follow Up Consult  Pharmacy Consult for Heparin Indication: atrial fibrillation  Labs:  Recent Labs  03/30/13 0735 03/30/13 1218 03/30/13 1824 03/30/13 2000 03/31/13 0245 03/31/13 1325  HGB 10.8*  --   --   --  12.9  --   HCT 32.6*  --   --   --  35.7*  --   PLT 213  --   --   --  244  --   LABPROT 14.0  --   --   --   --   --   INR 1.10  --   --   --   --   --   HEPARINUNFRC  --   --   --  0.51 0.71* 0.47  CREATININE  --  1.10  --   --  1.16*  --   TROPONINI <0.30 <0.30 <0.30  --  <0.30  --     Assessment: 79yo female now therapeutic on heparin for Afib.  No bleeding noted.  Goal of Therapy:  Heparin level 0.3-0.7 units/ml   Plan:  1) Continue heparin at 800 units / hr 2) Follow up AM heparin level, CBC  Thank you. Anette Guarneri, PharmD [phone]  03/31/2013,3:09 PM

## 2013-03-31 NOTE — Progress Notes (Addendum)
Nutrition Brief Note  Patient identified on the Malnutrition Screening Tool (MST) Report for recent weight lost without trying.  Per chart review, patient does not weigh herself regularly.  Wt Readings from Last 15 Encounters:  03/31/13 131 lb 1.6 oz (59.467 kg)    Body mass index is 21.82 kg/(m^2). Patient meets criteria for Normal based on current BMI.   Current diet order is Carbohydrate Modified Medium Calorie, patient is consuming approximately 75% of meals at this time. Labs and medications reviewed.   No nutrition interventions warranted at this time. If nutrition issues arise, please consult RD.   Arthur Holms, RD, LDN Pager #: [phone] After-Hours Pager #: [phone]

## 2013-03-31 NOTE — Progress Notes (Signed)
Patient Name: Patricia Randall      SUBJECTIVE 79 yo female admitted 10/10 with SOB orhtopnea and found to be in afib   Less SOB today  CHADSVAS age-12, HTN, DM  Echo pending;  Tn -X3 Past Medical History  Diagnosis Date  . Hypertension   . High cholesterol   . Claudication   . Glaucoma   . Shortness of breath     "got so it was all the time last couple weekst" (03/30/2013)  . Type II diabetes mellitus   . CHF (congestive heart failure)     acute onset/notes 03/30/2013  . Atrial fibrillation with rapid ventricular response     Archie Endo 03/30/2013  . Atrial fibrillation 03/31/2013    Scheduled Meds:  Scheduled Meds: . aspirin EC  81 mg Oral Daily  . furosemide  40 mg Intravenous Q8H  . glimepiride  1 mg Oral QAC breakfast  . insulin aspart  0-15 Units Subcutaneous TID WC  . latanoprost  1 drop Both Eyes QHS  . lisinopril  20 mg Oral Daily  . metoprolol tartrate  50 mg Oral BID  . simvastatin  20 mg Oral q1800   Continuous Infusions: . diltiazem (CARDIZEM) infusion 5 mg/hr (03/30/13 1157)  . heparin 800 Units/hr (03/31/13 0406)    PHYSICAL EXAM Filed Vitals:   03/30/13 1813 03/30/13 2002 03/31/13 0600 03/31/13 0927  BP: 113/68 111/86 126/72 116/72  Pulse: 63 78 74 62  Temp: 98 F (36.7 C) 97 F (36.1 C) 97 F (36.1 C) 97.8 F (36.6 C)  TempSrc: Oral Oral Oral Oral  Resp: 20 18 18 24   Height:      Weight:   131 lb 1.6 oz (59.467 kg)   SpO2: 99% 100% 100% 100%    General appearance: alert, cooperative and no distress Neck: no JVD and supple, symmetrical, trachea midline Lungs: clear to auscultation bilaterally Heart: irregularly irregular rhythm Abdomen: soft, non-tender; bowel sounds normal; no masses,  no organomegaly Extremities: extremities normal, atraumatic, no cyanosis or edema Skin: Skin color, texture, turgor normal. No rashes or lesions Neurologic: Grossly normal  TELEMETRY: Reviewed telemetry pt in Afib HR 50-130:    Intake/Output Summary  (Last 24 hours) at 03/31/13 0948 Last data filed at 03/31/13 0100  Gross per 24 hour  Intake    480 ml  Output   3150 ml  Net  -2670 ml    LABS: Basic Metabolic Panel:  Recent Labs Lab 03/30/13 1218 03/31/13 0245  NA 143 144  K 3.5 3.6  CL 107 104  CO2 19 24  GLUCOSE 111* 150*  BUN 22 24*  CREATININE 1.10 1.16*  CALCIUM 8.4 8.8  MG 0.9*  --    Cardiac Enzymes:  Recent Labs  03/30/13 1218 03/30/13 1824 03/31/13 0245  TROPONINI <0.30 <0.30 <0.30   CBC:  Recent Labs Lab 03/30/13 0735 03/31/13 0245  WBC 4.3 5.4  HGB 10.8* 12.9  HCT 32.6* 35.7*  MCV 80.9 78.5  PLT 213 244   PROTIME:  Recent Labs  03/30/13 0735  LABPROT 14.0  INR 1.10   Liver Function Tests:  Recent Labs  03/30/13 1218  AST 28  ALT 46*  ALKPHOS 47  BILITOT 0.6  PROT 6.3  ALBUMIN 3.3*   No results found for this basename: LIPASE, AMYLASE,  in the last 72 hours BNP: BNP (last 3 results)  Recent Labs  03/30/13 0735  PROBNP 4714.0*   D-Dimer: No results found for this basename: DDIMER,  in the last  72 hours Hemoglobin A1C: No results found for this basename: HGBA1C,  in the last 72 hours Fasting Lipid Panel:  Recent Labs  03/31/13 0638  CHOL 126  HDL 62  LDLCALC 51  TRIG 64  CHOLHDL 2.0   Thyroid Function Tests:  Recent Labs  03/30/13 1218  TSH 1.190    ASSESSMENT AND PLAN:  Active Problems:   Atrial fibrillation   Acute congestive heart failure  Vigorous diuresis  Continue one more day but will ease off a little with change in Bun/cr D/c dilt  Increase BB Await echo  Signed, Virl Axe MD  03/31/2013

## 2013-03-31 NOTE — Progress Notes (Signed)
ANTICOAGULATION CONSULT NOTE - Follow Up Consult  Pharmacy Consult for heparin Indication: atrial fibrillation  Labs:  Recent Labs  03/30/13 0735 03/30/13 1218 03/30/13 1824 03/30/13 2000 03/31/13 0245  HGB 10.8*  --   --   --  12.9  HCT 32.6*  --   --   --  35.7*  PLT 213  --   --   --  244  LABPROT 14.0  --   --   --   --   INR 1.10  --   --   --   --   HEPARINUNFRC  --   --   --  0.51 0.71*  CREATININE  --  1.10  --   --  1.16*  TROPONINI <0.30 <0.30 <0.30  --  <0.30    Assessment: 79yo female now slightly supratherapeutic on heparin after one level at goal, seems to be accumulating.  Goal of Therapy:  Heparin level 0.3-0.7 units/ml   Plan:  Will decrease heparin gtt slightly to 800 units/hr and check level in [place], PharmD, BCPS  03/31/2013,4:03 AM

## 2013-03-31 NOTE — Progress Notes (Signed)
Cardizem drip d/c'd at 10:15. Pt remains in a-fib with rate 80's-90's.

## 2013-04-01 DIAGNOSIS — I429 Cardiomyopathy, unspecified: Secondary | ICD-10-CM

## 2013-04-01 DIAGNOSIS — I472 Ventricular tachycardia: Secondary | ICD-10-CM

## 2013-04-01 DIAGNOSIS — I369 Nonrheumatic tricuspid valve disorder, unspecified: Secondary | ICD-10-CM

## 2013-04-01 LAB — CBC
HCT: 37 % (ref 36.0–46.0)
MCHC: 35.1 g/dL (ref 30.0–36.0)
MCV: 78.2 fL (ref 78.0–100.0)
Platelets: 250 10*3/uL (ref 150–400)
RDW: 16.7 % — ABNORMAL HIGH (ref 11.5–15.5)

## 2013-04-01 LAB — BASIC METABOLIC PANEL
BUN: 27 mg/dL — ABNORMAL HIGH (ref 6–23)
Calcium: 8.3 mg/dL — ABNORMAL LOW (ref 8.4–10.5)
Chloride: 101 mEq/L (ref 96–112)
Creatinine, Ser: 1.2 mg/dL — ABNORMAL HIGH (ref 0.50–1.10)
GFR calc Af Amer: 47 mL/min — ABNORMAL LOW (ref >90)
GFR calc non Af Amer: 41 mL/min — ABNORMAL LOW (ref >90)
Potassium: 3 mEq/L — ABNORMAL LOW (ref 3.5–5.1)
Sodium: 140 mEq/L (ref 135–145)

## 2013-04-01 LAB — GLUCOSE, CAPILLARY
Glucose-Capillary: 188 mg/dL — ABNORMAL HIGH (ref 70–99)
Glucose-Capillary: 124 mg/dL — ABNORMAL HIGH (ref 70–99)
Glucose-Capillary: 116 mg/dL — ABNORMAL HIGH (ref 70–99)
Glucose-Capillary: 168 mg/dL — ABNORMAL HIGH (ref 70–99)
Glucose-Capillary: 163 mg/dL — ABNORMAL HIGH (ref 70–99)

## 2013-04-01 LAB — HEPARIN LEVEL (UNFRACTIONATED): Heparin Unfractionated: 0.73 IU/mL — ABNORMAL HIGH (ref 0.30–0.70)

## 2013-04-01 MED ORDER — SODIUM CHLORIDE 0.9 % IV SOLN: INTRAVENOUS | Status: DC

## 2013-04-01 MED ORDER — POTASSIUM CHLORIDE CRYS ER 20 MEQ PO TBCR
40.0000 meq | EXTENDED_RELEASE_TABLET | ORAL | Status: AC
  Administered 2013-04-01 (×3): 40 meq via ORAL
  Filled 2013-04-01 (×3): qty 2

## 2013-04-01 MED ORDER — POTASSIUM CHLORIDE CRYS ER 20 MEQ PO TBCR
40.0000 meq | EXTENDED_RELEASE_TABLET | Freq: Once | ORAL | Status: AC
  Administered 2013-04-01: 40 meq via ORAL
  Filled 2013-04-01: qty 2

## 2013-04-01 MED ORDER — MAGNESIUM SULFATE 40 MG/ML IJ SOLN
2.0000 g | Freq: Once | INTRAMUSCULAR | Status: AC
  Administered 2013-04-01: 13:00:00 2 g via INTRAVENOUS
  Filled 2013-04-01: qty 50

## 2013-04-01 MED ORDER — APIXABAN 2.5 MG PO TABS
2.5000 mg | ORAL_TABLET | Freq: Two times a day (BID) | ORAL | Status: DC
  Administered 2013-04-01 – 2013-04-04 (×7): 2.5 mg via ORAL
  Filled 2013-04-01 (×9): qty 1

## 2013-04-01 MED ORDER — DIGOXIN 125 MCG PO TABS
0.1250 mg | ORAL_TABLET | Freq: Every day | ORAL | Status: DC
  Filled 2013-04-01: qty 1

## 2013-04-01 MED ORDER — DIGOXIN 250 MCG PO TABS
0.2500 mg | ORAL_TABLET | Freq: Three times a day (TID) | ORAL | Status: AC
  Administered 2013-04-01 – 2013-04-02 (×2): 0.25 mg via ORAL
  Filled 2013-04-01 (×3): qty 1

## 2013-04-01 NOTE — Progress Notes (Signed)
Pt ambulated down entire hallway(400 ft) without distress or 02

## 2013-04-01 NOTE — Progress Notes (Addendum)
Patient Name: Patricia Randall      SUBJECTIVE 79 yo female admitted 10/10 with SOB orhtopnea and found to be in afib   Less SOB today  CHADSVAS age-35, HTN, DM CHF  Prelim Echo severe LV dysfunction and global to my eye ;  Tn -X3 Past Medical History  Diagnosis Date  . Hypertension   . High cholesterol   . Claudication   . Glaucoma   . Shortness of breath     "got so it was all the time last couple weekst" (03/30/2013)  . Type II diabetes mellitus   . CHF (congestive heart failure)     acute onset/notes 03/30/2013  . Atrial fibrillation 03/31/2013    Scheduled Meds:  Scheduled Meds: . furosemide  40 mg Intravenous BID  . glimepiride  1 mg Oral QAC breakfast  . insulin aspart  0-15 Units Subcutaneous TID WC  . latanoprost  1 drop Both Eyes QHS  . lisinopril  20 mg Oral Daily  . metoprolol tartrate  50 mg Oral Q8H  . simvastatin  20 mg Oral q1800   Continuous Infusions: . heparin 700 Units/hr (04/01/13 0533)    PHYSICAL EXAM Filed Vitals:   03/31/13 0927 03/31/13 1330 03/31/13 2010 04/01/13 0609  BP: 116/72 113/94 101/63 115/71  Pulse: 62 84 86 76  Temp: 97.8 F (36.6 C) 98 F (36.7 C) 98 F (36.7 C) 97.4 F (36.3 C)  TempSrc: Oral Oral Oral Oral  Resp: 24 22 18 18   Height:      Weight:    128 lb 4.9 oz (58.2 kg)  SpO2: 100% 97% 99% 98%    General appearance: alert, cooperative and no distress Neck: no JVD and supple, symmetrical, trachea midline Lungs: clear to auscultation bilaterally Heart: irregularly irregular rhythm 2/6 murmure  pmi displaced Abdomen: soft, non-tender; bowel sounds normal; no masses,  no organomegaly Extremities: extremities normal, atraumatic, no cyanosis or edema Skin: Skin color, texture, turgor normal. No rashes or lesions Neurologic: Grossly normal  TELEMETRY: Reviewed telemetry pt in Afib HR 50-130:    Intake/Output Summary (Last 24 hours) at 04/01/13 1055 Last data filed at 04/01/13 0500  Gross per 24 hour  Intake    480  ml  Output   1425 ml  Net   -945 ml    LABS: Basic Metabolic Panel:  Recent Labs Lab 03/30/13 1218 03/31/13 0245 04/01/13 0425  NA 143 144 140  K 3.5 3.6 3.0*  CL 107 104 101  CO2 19 24 23   GLUCOSE 111* 150* 132*  BUN 22 24* 27*  CREATININE 1.10 1.16* 1.20*  CALCIUM 8.4 8.8 8.3*  MG 0.9*  --   --    Cardiac Enzymes:  Recent Labs  03/30/13 1218 03/30/13 1824 03/31/13 0245  TROPONINI <0.30 <0.30 <0.30   CBC:  Recent Labs Lab 03/30/13 0735 03/31/13 0245 04/01/13 0425  WBC 4.3 5.4 5.8  HGB 10.8* 12.9 13.0  HCT 32.6* 35.7* 37.0  MCV 80.9 78.5 78.2  PLT 213 244 250   PROTIME:  Recent Labs  03/30/13 0735  LABPROT 14.0  INR 1.10   Liver Function Tests:  Recent Labs  03/30/13 1218  AST 28  ALT 46*  ALKPHOS 47  BILITOT 0.6  PROT 6.3  ALBUMIN 3.3*   No results found for this basename: LIPASE, AMYLASE,  in the last 72 hours BNP: BNP (last 3 results)  Recent Labs  03/30/13 0735  PROBNP 4714.0*   D-Dimer: No results found for this basename: DDIMER,  in the last 72 hours Hemoglobin A1C: No results found for this basename: HGBA1C,  in the last 72 hours Fasting Lipid Panel:  Recent Labs  03/31/13 0638  CHOL 126  HDL 62  LDLCALC 51  TRIG 64  CHOLHDL 2.0   Thyroid Function Tests:  Recent Labs  03/30/13 1218  TSH 1.190    ASSESSMENT AND PLAN:  Active Problems:   Atrial fibrillation- still with RVR   Acute congestive heart failure- improved   Hypokalemia   Cardiomyopathy    Ventricular tachycardia -nonsustained   Change diuretics to po Replete K D/c dilt  Increase BB Add dig given LV dysfunction but will wait until after K repletion so as to minimize toxicity issues Will transition to apixoban and anticpate DCCV-TEE in am  Rate control is problematic  With severe LV dysfunction present would pursue TEEDCCV i To pursue SR first and reassess LV function later Follow VT  Mag was very low 2 days ago and i missed it Reviewed with  pt and sister    Signed, Virl Axe MD  04/01/2013

## 2013-04-01 NOTE — Progress Notes (Signed)
Echocardiogram 2D Echocardiogram has been performed.  Joelene Millin 04/01/2013, 11:31 AM

## 2013-04-01 NOTE — Progress Notes (Signed)
ANTICOAGULATION CONSULT NOTE - Follow Up Consult  Pharmacy Consult for apixaban Indication: atrial fibrillation  Labs:  Recent Labs  03/30/13 0735 03/30/13 1218 03/30/13 1824  03/31/13 0245 03/31/13 1325 04/01/13 0425  HGB 10.8*  --   --   --  12.9  --  13.0  HCT 32.6*  --   --   --  35.7*  --  37.0  PLT 213  --   --   --  244  --  250  LABPROT 14.0  --   --   --   --   --   --   INR 1.10  --   --   --   --   --   --   HEPARINUNFRC  --   --   --   < > 0.71* 0.47 0.73*  CREATININE  --  1.10  --   --  1.16*  --  1.20*  TROPONINI <0.30 <0.30 <0.30  --  <0.30  --   --   < > = values in this interval not displayed.  Assessment: 79yo female formerly on a heparin infusion to be transitioned to apixaban.  Goal of Therapy:  Heparin level 0.3-0.7 units/ml   Plan:  Apixaban 2.5mg  po BID (adjusted for age > 67 and weight <60kg) Stop heparin drip after the first apixaban dosage is given.  Heide Guile, PharmD, BCPS Clinical Pharmacist Pager [phone]   04/01/2013,1:17 PM

## 2013-04-01 NOTE — Progress Notes (Signed)
ANTICOAGULATION CONSULT NOTE - Follow Up Consult  Pharmacy Consult for heparin Indication: atrial fibrillation  Labs:  Recent Labs  03/30/13 0735 03/30/13 1218 03/30/13 1824  03/31/13 0245 03/31/13 1325 04/01/13 0425  HGB 10.8*  --   --   --  12.9  --  13.0  HCT 32.6*  --   --   --  35.7*  --  37.0  PLT 213  --   --   --  244  --  250  LABPROT 14.0  --   --   --   --   --   --   INR 1.10  --   --   --   --   --   --   HEPARINUNFRC  --   --   --   < > 0.71* 0.47 0.73*  CREATININE  --  1.10  --   --  1.16*  --   --   TROPONINI <0.30 <0.30 <0.30  --  <0.30  --   --   < > = values in this interval not displayed.  Assessment: 79yo female now slightly supratherapeutic on heparin after one level at goal after rate decrease, seems to be accumulating.  Goal of Therapy:  Heparin level 0.3-0.7 units/ml   Plan:  Will decrease heparin gtt slightly to 700 units/hr and check level in [place], PharmD, BCPS  04/01/2013,5:30 AM

## 2013-04-02 ENCOUNTER — Encounter (HOSPITAL_COMMUNITY)
Admission: EM | Disposition: A | Discharge status: Home Health | Payer: Medicare Other | Source: Home / Self Care | Attending: Cardiology

## 2013-04-02 ENCOUNTER — Encounter (HOSPITAL_COMMUNITY): Payer: Self-pay | Admitting: Certified Registered Nurse Anesthetist

## 2013-04-02 ENCOUNTER — Inpatient Hospital Stay (HOSPITAL_COMMUNITY): Payer: Medicare Other | Admitting: Certified Registered Nurse Anesthetist

## 2013-04-02 ENCOUNTER — Encounter (HOSPITAL_COMMUNITY): Payer: Medicare Other | Admitting: Certified Registered Nurse Anesthetist

## 2013-04-02 DIAGNOSIS — I5021 Acute systolic (congestive) heart failure: Secondary | ICD-10-CM

## 2013-04-02 DIAGNOSIS — I4891 Unspecified atrial fibrillation: Secondary | ICD-10-CM

## 2013-04-02 HISTORY — PX: TEE WITHOUT CARDIOVERSION: SHX5443

## 2013-04-02 HISTORY — PX: CARDIOVERSION: SHX1299

## 2013-04-02 LAB — CBC
HCT: 37.7 % (ref 36.0–46.0)
MCV: 80.7 fL (ref 78.0–100.0)
Platelets: 252 10*3/uL (ref 150–400)
RBC: 4.67 MIL/uL (ref 3.87–5.11)
WBC: 6.7 10*3/uL (ref 4.0–10.5)

## 2013-04-02 LAB — BASIC METABOLIC PANEL
BUN: 30 mg/dL — ABNORMAL HIGH (ref 6–23)
CO2: 20 mEq/L (ref 19–32)
Chloride: 103 mEq/L (ref 96–112)
Creatinine, Ser: 1.24 mg/dL — ABNORMAL HIGH (ref 0.50–1.10)
Sodium: 138 mEq/L (ref 135–145)

## 2013-04-02 LAB — GLUCOSE, CAPILLARY: Glucose-Capillary: 124 mg/dL — ABNORMAL HIGH (ref 70–99)

## 2013-04-02 LAB — MAGNESIUM: Magnesium: 1.5 mg/dL (ref 1.5–2.5)

## 2013-04-02 SURGERY — ECHOCARDIOGRAM, TRANSESOPHAGEAL
Anesthesia: General

## 2013-04-02 MED ORDER — MIDAZOLAM HCL 10 MG/2ML IJ SOLN
INTRAMUSCULAR | Status: DC | PRN
  Administered 2013-04-02: 2 mg via INTRAVENOUS
  Administered 2013-04-02: 1 mg via INTRAVENOUS

## 2013-04-02 MED ORDER — FENTANYL CITRATE 0.05 MG/ML IJ SOLN
INTRAMUSCULAR | Status: AC
  Filled 2013-04-02: qty 2

## 2013-04-02 MED ORDER — METOPROLOL SUCCINATE ER 25 MG PO TB24
25.0000 mg | ORAL_TABLET | Freq: Two times a day (BID) | ORAL | Status: DC
  Administered 2013-04-02 – 2013-04-04 (×4): 25 mg via ORAL
  Filled 2013-04-02 (×6): qty 1

## 2013-04-02 MED ORDER — FENTANYL CITRATE 0.05 MG/ML IJ SOLN
INTRAMUSCULAR | Status: DC | PRN
  Administered 2013-04-02 (×2): 25 ug via INTRAVENOUS

## 2013-04-02 MED ORDER — MAGNESIUM SULFATE 40 MG/ML IJ SOLN
2.0000 g | Freq: Once | INTRAMUSCULAR | Status: AC
  Administered 2013-04-02: 09:00:00 2 g via INTRAVENOUS
  Filled 2013-04-02: qty 50

## 2013-04-02 MED ORDER — SODIUM CHLORIDE 0.9 % IJ SOLN
3.0000 mL | Freq: Two times a day (BID) | INTRAMUSCULAR | Status: DC
  Administered 2013-04-02 – 2013-04-04 (×3): 3 mL via INTRAVENOUS

## 2013-04-02 MED ORDER — SODIUM CHLORIDE 0.9 % IJ SOLN: 3.0000 mL | INTRAMUSCULAR | Status: DC | PRN

## 2013-04-02 MED ORDER — MAGNESIUM SULFATE 50 % IJ SOLN: 2.0000 g | Freq: Once | INTRAMUSCULAR | Status: DC

## 2013-04-02 MED ORDER — METOPROLOL SUCCINATE ER 50 MG PO TB24
50.0000 mg | ORAL_TABLET | Freq: Two times a day (BID) | ORAL | Status: DC
  Filled 2013-04-02 (×3): qty 1

## 2013-04-02 MED ORDER — SODIUM CHLORIDE 0.9 % IV SOLN
INTRAVENOUS | Status: DC
  Administered 2013-04-02: 18:00:00 10 mL via INTRAVENOUS
  Administered 2013-04-03: 250 mL via INTRAVENOUS

## 2013-04-02 MED ORDER — BUTAMBEN-TETRACAINE-BENZOCAINE 2-2-14 % EX AERO
INHALATION_SPRAY | CUTANEOUS | Status: DC | PRN
  Administered 2013-04-02: 14:00:00 2 via TOPICAL

## 2013-04-02 MED ORDER — MIDAZOLAM HCL 5 MG/ML IJ SOLN
INTRAMUSCULAR | Status: AC
  Filled 2013-04-02: qty 2

## 2013-04-02 MED ORDER — SODIUM CHLORIDE 0.9 % IV SOLN
250.0000 mL | INTRAVENOUS | Status: DC
  Administered 2013-04-02: 09:00:00 250 mL via INTRAVENOUS

## 2013-04-02 NOTE — Interval H&P Note (Signed)
History and Physical Interval Note:  04/02/2013 1:42 PM  Patricia Randall  has presented today for surgery, with the diagnosis of a-fib  The various methods of treatment have been discussed with the patient and family. After consideration of risks, benefits and other options for treatment, the patient has consented to  Procedure(s) with comments: TRANSESOPHAGEAL ECHOCARDIOGRAM (TEE) (N/A) CARDIOVERSION (N/A) - Spoke with Tom  as a surgical intervention .  The patient's history has been reviewed, patient examined, no change in status, stable for surgery.  I have reviewed the patient's chart and labs.  Questions were answered to the patient's satisfaction.     Darden Amber.

## 2013-04-02 NOTE — Progress Notes (Signed)
Call received from [facility] in endo requesting travel info.  Also stated pt will be picked up at 1230pm today.  Pt and granddaughter at bedside informed.  Karie Kirks, Therapist, sports.

## 2013-04-02 NOTE — Anesthesia Preprocedure Evaluation (Signed)
Anesthesia Evaluation  Patient identified by MRN, date of birth, ID band Patient awake    Reviewed: Allergy & Precautions, H&P , NPO status , Patient's Chart, lab work & pertinent test results, reviewed documented beta blocker date and time   Airway Mallampati: II TM Distance: >3 FB Neck ROM: full    Dental   Pulmonary shortness of breath and with exertion,  breath sounds clear to auscultation        Cardiovascular hypertension, +CHF + dysrhythmias Atrial Fibrillation Rhythm:regular     Neuro/Psych negative neurological ROS  negative psych ROS   GI/Hepatic negative GI ROS, Neg liver ROS,   Endo/Other  diabetes, Oral Hypoglycemic Agents  Renal/GU negative Renal ROS  negative genitourinary   Musculoskeletal   Abdominal   Peds  Hematology negative hematology ROS (+)   Anesthesia Other Findings See surgeon's H&P   Reproductive/Obstetrics negative OB ROS                           Anesthesia Physical Anesthesia Plan  ASA: III  Anesthesia Plan: General   Post-op Pain Management:    Induction: Intravenous  Airway Management Planned: Mask  Additional Equipment:   Intra-op Plan:   Post-operative Plan:   Informed Consent: I have reviewed the patients History and Physical, chart, labs and discussed the procedure including the risks, benefits and alternatives for the proposed anesthesia with the patient or authorized representative who has indicated his/her understanding and acceptance.   Dental Advisory Given  Plan Discussed with: CRNA and Surgeon  Anesthesia Plan Comments:         Anesthesia Quick Evaluation

## 2013-04-02 NOTE — Progress Notes (Signed)
Instructed by Dr. Aundra Dubin to go ahead and give pt her eliquis now that is due at 1000 am.  Missing med done and pharm. Called to send as per MD instruction.  Awaiting pharm. To send med.  Will continue to monitor.  Karie Kirks, Therapist, sports.

## 2013-04-02 NOTE — Anesthesia Postprocedure Evaluation (Signed)
Anesthesia Post Note  Patient: Patricia Randall  Procedure(s) Performed: Procedure(s) (LRB): TRANSESOPHAGEAL ECHOCARDIOGRAM (TEE) (N/A) CARDIOVERSION (N/A)  Anesthesia type: General  Patient location: PACU  Post pain: Pain level controlled  Post assessment: Patient's Cardiovascular Status Stable  Last Vitals:  Filed Vitals:   04/02/13 1510  BP: 117/70  Pulse: 53  Temp:   Resp: 11    Post vital signs: Reviewed and stable  Level of consciousness: alert  Complications: No apparent anesthesia complications

## 2013-04-02 NOTE — Progress Notes (Signed)
Patient ID: Patricia Randall, female   DOB: April 24, 1929, 79 y.o.   MRN: [id number]   SUBJECTIVE:  Patient remains in atrial fibrillation with controlled rate.  She was diuresed over weekend and breathing is much better.  No dyspnea currently, no longer orthopneic.   Marland Kitchen apixaban  2.5 mg Oral BID  . digoxin  0.25 mg Oral Q8H   Followed by  . [START ON 04/03/2013] digoxin  0.125 mg Oral Daily  . glimepiride  1 mg Oral QAC breakfast  . insulin aspart  0-15 Units Subcutaneous TID WC  . latanoprost  1 drop Both Eyes QHS  . lisinopril  20 mg Oral Daily  . magnesium sulfate 1 - 4 g bolus IVPB  2 g Intravenous Once  . metoprolol succinate  50 mg Oral BID  . simvastatin  20 mg Oral q1800  . sodium chloride  3 mL Intravenous Q12H      Filed Vitals:   04/01/13 1300 04/01/13 2124 04/02/13 0337 04/02/13 0545  BP: 115/84 120/85  116/80  Pulse: 103 97 78 102  Temp: 97.4 F (36.3 C) 98.2 F (36.8 C)  97.8 F (36.6 C)  TempSrc: Oral Oral  Oral  Resp: 20 18  16   Height:      Weight:    59.421 kg (131 lb)  SpO2: 98% 100%  98%    Intake/Output Summary (Last 24 hours) at 04/02/13 0804 Last data filed at 04/02/13 0733  Gross per 24 hour  Intake    720 ml  Output   1500 ml  Net   -780 ml    LABS: Basic Metabolic Panel:  Recent Labs  03/30/13 1218  04/01/13 0425 04/02/13 0405  NA 143  < > 140 138  K 3.5  < > 3.0* 4.8  CL 107  < > 101 103  CO2 19  < > 23 20  GLUCOSE 111*  < > 132* 103*  BUN 22  < > 27* 30*  CREATININE 1.10  < > 1.20* 1.24*  CALCIUM 8.4  < > 8.3* 8.2*  MG 0.9*  --   --  1.5  < > = values in this interval not displayed. Liver Function Tests:  Recent Labs  03/30/13 1218  AST 28  ALT 46*  ALKPHOS 47  BILITOT 0.6  PROT 6.3  ALBUMIN 3.3*   No results found for this basename: LIPASE, AMYLASE,  in the last 72 hours CBC:  Recent Labs  04/01/13 0425 04/02/13 0405  WBC 5.8 6.7  HGB 13.0 13.0  HCT 37.0 37.7  MCV 78.2 80.7  PLT 250 252   Cardiac  Enzymes:  Recent Labs  03/30/13 1218 03/30/13 1824 03/31/13 0245  TROPONINI <0.30 <0.30 <0.30   BNP: No components found with this basename: POCBNP,  D-Dimer: No results found for this basename: DDIMER,  in the last 72 hours Hemoglobin A1C: No results found for this basename: HGBA1C,  in the last 72 hours Fasting Lipid Panel:  Recent Labs  03/31/13 0638  CHOL 126  HDL 62  LDLCALC 51  TRIG 64  CHOLHDL 2.0   Thyroid Function Tests:  Recent Labs  03/30/13 1218  TSH 1.190   Anemia Panel: No results found for this basename: VITAMINB12, FOLATE, FERRITIN, TIBC, IRON, RETICCTPCT,  in the last 72 hours  RADIOLOGY: Dg Chest Port 1 View  03/30/2013   CLINICAL DATA:  Short of breath  EXAM: PORTABLE CHEST - 1 VIEW  COMPARISON:  None.  FINDINGS: The cardiac silhouette is  enlarged. The mediastinum is normal in contour. No hilar masses.  There is mild central vascular congestion. Coarse reticular opacities at the bases is likely due to atelectasis. No convincing infiltrate. There may be a small left effusion.  No pneumothorax.  IMPRESSION: Cardiomegaly. Vascular congestion without overt pulmonary edema. Mild lung base opacity likely atelectasis. Possible small left effusion. Mild congestive heart failure is suspected.   Electronically Signed   By: Lajean Manes M.D.   On: 03/30/2013 08:04    PHYSICAL EXAM General: NAD Neck: No JVD, no thyromegaly or thyroid nodule.  Lungs: Clear to auscultation bilaterally with normal respiratory effort. CV: Nondisplaced PMI.  Heart regular S1/S2, no S3/S4, no murmur.  No peripheral edema.  No carotid bruit.  Normal pedal pulses.  Abdomen: Soft, nontender, no hepatosplenomegaly, no distention.  Neurologic: Alert and oriented x 3.  Psych: Normal affect. Extremities: No clubbing or cyanosis.   TELEMETRY: Reviewed telemetry pt in atrial fibrillation, rate in 80s  ASSESSMENT AND PLAN: 79 yo presented with atrial fibrillation/RVR and acute systolic  CHF.  1. Atrial fibrillation: Rate is now controlled and she is on Eliquis at 2.5 mg bid dosing (age > 13, weight < 60 kg).  Reviewed notes from weekend, planned for TEE-guided DCCV today given decreased LV systolic function (concern for tachy-mediated CMP).  Stop metoprolol tartrate and start Toprol XL 50 mg bid today.  She is on digoxin. Replete Mg again this morning.  2. Acute systolic CHF: EF 0000000 on echo (somewhat difficult as HR elevated when echo was done). Troponin negative x 3.  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  Given negative troponin and lack of chest pain, think reasonable to cardiovert to NSR and anticoagulate for 1 month with repeat echo.  If EF still low, will need cardiac cath. Continue lisinopril and digoxin, will transition from metoprolol tartrate to Toprol XL.  She appears euvolemic this morning and dyspnea has resolved.  With mild creatinine rise, will hold Lasix today and restart it po tomorrow.  3. Disposition: Anticipate home in the morning.   [name] 04/02/2013 8:10 AM

## 2013-04-02 NOTE — Op Note (Signed)
Anesthesia in for cardioversion.

## 2013-04-02 NOTE — Preoperative (Signed)
Beta Blockers   Reason not to administer Beta Blockers:Not Applicable 

## 2013-04-02 NOTE — Progress Notes (Signed)
Echocardiogram Echocardiogram Transesophageal has been performed.  Bachmann, Ava Tangney 04/02/2013, 2:27 PM

## 2013-04-02 NOTE — CV Procedure (Addendum)
    Transesophageal Echocardiogram Note  Chanley Legault [id number] [birth year]  Procedure: Transesophageal Echocardiogram Indications: atrial fibrillation, chronic systolic chf  Procedure Details Consent: Obtained Time Out: Verified patient identification, verified procedure, site/side was marked, verified correct patient position, special equipment/implants available, Radiology Safety Procedures followed,  medications/allergies/relevent history reviewed, required imaging and test results available.  Performed  Medications: Fentanyl: 50 mcg IV Versed: 3 mg IV  Left Ventrical:  Severe LV dysfunction.  EF 30%  Mitral Valve: normal  Aortic Valve: normal  Tricuspid Valve: trace TR  Pulmonic Valve: trivial PI  Left Atrium/ Left atrial appendage: large, no thrombus,  There is extensive "smoke" ( spontaneous contrast ) in the left atrium  Atrial septum: intact by color doppler  Aorta: mild calcification.  There is spontaneous contrast in the descending throacic aorta.    Complications: No apparent complications Patient did tolerate procedure well.   Will continue with cardioversion.     Cardioversion Note  Ashlinn Bean [id number] 1929-01-24  Procedure: DC Cardioversion Indications: atiall fib  Procedure Details Consent: Obtained Time Out: Verified patient identification, verified procedure, site/side was marked, verified correct patient position, special equipment/implants available, Radiology Safety Procedures followed,  medications/allergies/relevent history reviewed, required imaging and test results available.  Performed  The patient has been on adequate anticoagulation.  The patient received mo further medications for sedation.  Synchronous cardioversion was performed at 120  joules.  The cardioversion was successful    Complications: No apparent complications Patient did tolerate procedure well.  She was bradycardic after cardioversion.  Will decrease metoprolol  to 25 bid (from 50 bid)  Thayer Headings, Brooke Bonito., MD, [facility] 04/02/2013, 2:07 PM

## 2013-04-02 NOTE — Progress Notes (Signed)
Noted pt with order 275ml of NS at 39ml/h.  Clarified by Angelica Ran , PA to run IVF at [facility].  Karie Kirks, Therapist, sports.

## 2013-04-02 NOTE — Transfer of Care (Signed)
Immediate Anesthesia Transfer of Care Note  Patient: Patricia Randall  Procedure(s) Performed: Procedure(s) with comments: TRANSESOPHAGEAL ECHOCARDIOGRAM (TEE) (N/A) CARDIOVERSION (N/A) - Spoke with Tom   Patient Location: PACU  Anesthesia Type:MAC  Level of Consciousness: lethargic and responds to stimulation  Airway & Oxygen Therapy: Patient Spontanous Breathing and Patient connected to nasal cannula oxygen  Post-op Assessment: Report given to PACU RN and Post -op Vital signs reviewed and stable  Post vital signs: Reviewed and stable  Complications: No apparent anesthesia complications

## 2013-04-02 NOTE — H&P (View-Only) (Signed)
Patient ID: Patricia Randall, female   DOB: 06/03/1929, 79 y.o.   MRN: [id number]   SUBJECTIVE:  Patient remains in atrial fibrillation with controlled rate.  She was diuresed over weekend and breathing is much better.  No dyspnea currently, no longer orthopneic.   Marland Kitchen apixaban  2.5 mg Oral BID  . digoxin  0.25 mg Oral Q8H   Followed by  . [START ON 04/03/2013] digoxin  0.125 mg Oral Daily  . glimepiride  1 mg Oral QAC breakfast  . insulin aspart  0-15 Units Subcutaneous TID WC  . latanoprost  1 drop Both Eyes QHS  . lisinopril  20 mg Oral Daily  . magnesium sulfate 1 - 4 g bolus IVPB  2 g Intravenous Once  . metoprolol succinate  50 mg Oral BID  . simvastatin  20 mg Oral q1800  . sodium chloride  3 mL Intravenous Q12H      Filed Vitals:   04/01/13 1300 04/01/13 2124 04/02/13 0337 04/02/13 0545  BP: 115/84 120/85  116/80  Pulse: 103 97 78 102  Temp: 97.4 F (36.3 C) 98.2 F (36.8 C)  97.8 F (36.6 C)  TempSrc: Oral Oral  Oral  Resp: 20 18  16   Height:      Weight:    59.421 kg (131 lb)  SpO2: 98% 100%  98%    Intake/Output Summary (Last 24 hours) at 04/02/13 0804 Last data filed at 04/02/13 0733  Gross per 24 hour  Intake    720 ml  Output   1500 ml  Net   -780 ml    LABS: Basic Metabolic Panel:  Recent Labs  03/30/13 1218  04/01/13 0425 04/02/13 0405  NA 143  < > 140 138  K 3.5  < > 3.0* 4.8  CL 107  < > 101 103  CO2 19  < > 23 20  GLUCOSE 111*  < > 132* 103*  BUN 22  < > 27* 30*  CREATININE 1.10  < > 1.20* 1.24*  CALCIUM 8.4  < > 8.3* 8.2*  MG 0.9*  --   --  1.5  < > = values in this interval not displayed. Liver Function Tests:  Recent Labs  03/30/13 1218  AST 28  ALT 46*  ALKPHOS 47  BILITOT 0.6  PROT 6.3  ALBUMIN 3.3*   No results found for this basename: LIPASE, AMYLASE,  in the last 72 hours CBC:  Recent Labs  04/01/13 0425 04/02/13 0405  WBC 5.8 6.7  HGB 13.0 13.0  HCT 37.0 37.7  MCV 78.2 80.7  PLT 250 252   Cardiac  Enzymes:  Recent Labs  03/30/13 1218 03/30/13 1824 03/31/13 0245  TROPONINI <0.30 <0.30 <0.30   BNP: No components found with this basename: POCBNP,  D-Dimer: No results found for this basename: DDIMER,  in the last 72 hours Hemoglobin A1C: No results found for this basename: HGBA1C,  in the last 72 hours Fasting Lipid Panel:  Recent Labs  03/31/13 0638  CHOL 126  HDL 62  LDLCALC 51  TRIG 64  CHOLHDL 2.0   Thyroid Function Tests:  Recent Labs  03/30/13 1218  TSH 1.190   Anemia Panel: No results found for this basename: VITAMINB12, FOLATE, FERRITIN, TIBC, IRON, RETICCTPCT,  in the last 72 hours  RADIOLOGY: Dg Chest Port 1 View  03/30/2013   CLINICAL DATA:  Short of breath  EXAM: PORTABLE CHEST - 1 VIEW  COMPARISON:  None.  FINDINGS: The cardiac silhouette is  enlarged. The mediastinum is normal in contour. No hilar masses.  There is mild central vascular congestion. Coarse reticular opacities at the bases is likely due to atelectasis. No convincing infiltrate. There may be a small left effusion.  No pneumothorax.  IMPRESSION: Cardiomegaly. Vascular congestion without overt pulmonary edema. Mild lung base opacity likely atelectasis. Possible small left effusion. Mild congestive heart failure is suspected.   Electronically Signed   By: Lajean Manes M.D.   On: 03/30/2013 08:04    PHYSICAL EXAM General: NAD Neck: No JVD, no thyromegaly or thyroid nodule.  Lungs: Clear to auscultation bilaterally with normal respiratory effort. CV: Nondisplaced PMI.  Heart regular S1/S2, no S3/S4, no murmur.  No peripheral edema.  No carotid bruit.  Normal pedal pulses.  Abdomen: Soft, nontender, no hepatosplenomegaly, no distention.  Neurologic: Alert and oriented x 3.  Psych: Normal affect. Extremities: No clubbing or cyanosis.   TELEMETRY: Reviewed telemetry pt in atrial fibrillation, rate in 80s  ASSESSMENT AND PLAN: 79 yo presented with atrial fibrillation/RVR and acute systolic  CHF.  1. Atrial fibrillation: Rate is now controlled and she is on Eliquis at 2.5 mg bid dosing (age > 51, weight < 60 kg).  Reviewed notes from weekend, planned for TEE-guided DCCV today given decreased LV systolic function (concern for tachy-mediated CMP).  Stop metoprolol tartrate and start Toprol XL 50 mg bid today.  She is on digoxin. Replete Mg again this morning.  2. Acute systolic CHF: EF 0000000 on echo (somewhat difficult as HR elevated when echo was done). Troponin negative x 3.  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  Given negative troponin and lack of chest pain, think reasonable to cardiovert to NSR and anticoagulate for 1 month with repeat echo.  If EF still low, will need cardiac cath. Continue lisinopril and digoxin, will transition from metoprolol tartrate to Toprol XL.  She appears euvolemic this morning and dyspnea has resolved.  With mild creatinine rise, will hold Lasix today and restart it po tomorrow.  3. Disposition: Anticipate home in the morning.   [name] 04/02/2013 8:10 AM

## 2013-04-02 NOTE — Progress Notes (Signed)
Pt back form endo A&0x3.  Denies pain or discomfort.  Granddaughter  at bedside.  Will continue to monitor.  Karie Kirks, Therapist, sports.

## 2013-04-03 LAB — BASIC METABOLIC PANEL
BUN: 27 mg/dL — ABNORMAL HIGH (ref 6–23)
CO2: 26 mEq/L (ref 19–32)
Chloride: 103 mEq/L (ref 96–112)
Creatinine, Ser: 1.07 mg/dL (ref 0.50–1.10)
Glucose, Bld: 98 mg/dL (ref 70–99)
Potassium: 4.9 mEq/L (ref 3.5–5.1)

## 2013-04-03 LAB — GLUCOSE, CAPILLARY
Glucose-Capillary: 153 mg/dL — ABNORMAL HIGH (ref 70–99)
Glucose-Capillary: 70 mg/dL (ref 70–99)
Glucose-Capillary: 143 mg/dL — ABNORMAL HIGH (ref 70–99)

## 2013-04-03 LAB — CBC
HCT: 37 % (ref 36.0–46.0)
MCH: 27.6 pg (ref 26.0–34.0)
MCHC: 33.8 g/dL (ref 30.0–36.0)
MCV: 81.7 fL (ref 78.0–100.0)
RDW: 17.5 % — ABNORMAL HIGH (ref 11.5–15.5)

## 2013-04-03 MED ORDER — FUROSEMIDE 20 MG PO TABS
20.0000 mg | ORAL_TABLET | Freq: Every day | ORAL | Status: DC
  Administered 2013-04-03 – 2013-04-04 (×2): 20 mg via ORAL
  Filled 2013-04-03 (×3): qty 1

## 2013-04-03 NOTE — Care Management Note (Addendum)
  Page 2 of 2   04/03/2013     1:54:06 PM   CARE MANAGEMENT NOTE 04/03/2013  Patient:  Patricia Randall, Patricia Randall   Account Number:  [id number]  Date Initiated:  04/03/2013  Documentation initiated by:  Llana Aliment  Subjective/Objective Assessment:   79yo female admitted with AFIB.  Pt. lives with son.     Action/Plan:   discharge planning   Anticipated DC Date:  04/03/2013   Anticipated DC Plan:  [place]  CM consult      [facility] Choice  HOME HEALTH   Choice offered to / List presented to:  C-1 Patient        [place] arranged  HH-1 RN      [place].   Status of service:  Completed, signed off Medicare Important Message given?   (If response is "NO", the following Medicare IM given date fields will be blank) Date Medicare IM given:   Date Additional Medicare IM given:    Discharge Disposition:  [name]  Per UR Regulation:  Reviewed for med. necessity/level of care/duration of stay  If discussed at [place] of Stay Meetings, dates discussed:    Comments:  04/03/13 1330 In to speak with pt. about home health services.  After looking at list, pt. chose [name].  TC to [place], with [facility], to give referral for [facility] RN. Pt. to dc home today with son. Llana Aliment, RN, BSN NCM [phone]   In addition, pt. Co-pay for Eliquis is $45.00, and does NOT require a prior authorization. Llana Aliment, RN, BSN NCM

## 2013-04-03 NOTE — Progress Notes (Signed)
The patient did not have any complaints overnight other than sneezing.  She states that she is feeling well this morning and remained in SB-NSR overnight.  Her HR ranged from the 50s-70s.

## 2013-04-03 NOTE — Progress Notes (Addendum)
Pt had 8 beats run of VT and asymptomatic.  BP 96/60 P 62.  Marcello Fennel informed and also mentioned that pt stated that her son is unable to pick her up today if d/c, until tomorrow.  No new orders given.    Karie Kirks, Therapist, sports.

## 2013-04-03 NOTE — Progress Notes (Signed)
Patient ID: Patricia Randall, female   DOB: Jun 04, 1929, 79 y.o.   MRN: [id number]    SUBJECTIVE:  Patient was cardioverted yesterday, now in NSR.  No complaints this morning, no dyspnea.   Marland Kitchen apixaban  2.5 mg Oral BID  . furosemide  20 mg Oral Daily  . glimepiride  1 mg Oral QAC breakfast  . insulin aspart  0-15 Units Subcutaneous TID WC  . latanoprost  1 drop Both Eyes QHS  . lisinopril  20 mg Oral Daily  . metoprolol succinate  25 mg Oral BID  . simvastatin  20 mg Oral q1800  . sodium chloride  3 mL Intravenous Q12H      Filed Vitals:   04/02/13 1600 04/02/13 1746 04/02/13 2059 04/03/13 0500  BP: 106/62 110/60 111/49 124/80  Pulse: 55 61 61 68  Temp:   97.8 F (36.6 C) 97.4 F (36.3 C)  TempSrc:   Oral Oral  Resp: 18 18 20 20   Height:      Weight:    132 lb 15 oz (60.3 kg)  SpO2:   100% 100%    Intake/Output Summary (Last 24 hours) at 04/03/13 0735 Last data filed at 04/03/13 0707  Gross per 24 hour  Intake    770 ml  Output   1100 ml  Net   -330 ml    LABS: Basic Metabolic Panel:  Recent Labs  04/02/13 0405 04/03/13 0350  NA 138 139  K 4.8 4.9  CL 103 103  CO2 20 26  GLUCOSE 103* 98  BUN 30* 27*  CREATININE 1.24* 1.07  CALCIUM 8.2* 8.4  MG 1.5 2.1   Liver Function Tests: No results found for this basename: AST, ALT, ALKPHOS, BILITOT, PROT, ALBUMIN,  in the last 72 hours No results found for this basename: LIPASE, AMYLASE,  in the last 72 hours CBC:  Recent Labs  04/02/13 0405 04/03/13 0350  WBC 6.7 6.4  HGB 13.0 12.5  HCT 37.7 37.0  MCV 80.7 81.7  PLT 252 248   Cardiac Enzymes: No results found for this basename: CKTOTAL, CKMB, CKMBINDEX, TROPONINI,  in the last 72 hours BNP: No components found with this basename: POCBNP,  D-Dimer: No results found for this basename: DDIMER,  in the last 72 hours Hemoglobin A1C: No results found for this basename: HGBA1C,  in the last 72 hours Fasting Lipid Panel: No results found for this basename: CHOL,  HDL, LDLCALC, TRIG, CHOLHDL, LDLDIRECT,  in the last 72 hours Thyroid Function Tests: No results found for this basename: TSH, T4TOTAL, FREET3, T3FREE, THYROIDAB,  in the last 72 hours Anemia Panel: No results found for this basename: VITAMINB12, FOLATE, FERRITIN, TIBC, IRON, RETICCTPCT,  in the last 72 hours  RADIOLOGY: Dg Chest Port 1 View  03/30/2013   CLINICAL DATA:  Short of breath  EXAM: PORTABLE CHEST - 1 VIEW  COMPARISON:  None.  FINDINGS: The cardiac silhouette is enlarged. The mediastinum is normal in contour. No hilar masses.  There is mild central vascular congestion. Coarse reticular opacities at the bases is likely due to atelectasis. No convincing infiltrate. There may be a small left effusion.  No pneumothorax.  IMPRESSION: Cardiomegaly. Vascular congestion without overt pulmonary edema. Mild lung base opacity likely atelectasis. Possible small left effusion. Mild congestive heart failure is suspected.   Electronically Signed   By: Lajean Manes M.D.   On: 03/30/2013 08:04    PHYSICAL EXAM General: NAD Neck: No JVD, no thyromegaly or thyroid nodule.  Lungs:  Clear to auscultation bilaterally with normal respiratory effort. CV: Nondisplaced PMI.  Heart regular S1/S2, no S3/S4, no murmur.  No peripheral edema.  No carotid bruit.  Normal pedal pulses.  Abdomen: Soft, nontender, no hepatosplenomegaly, no distention.  Neurologic: Alert and oriented x 3.  Psych: Normal affect. Extremities: No clubbing or cyanosis.   TELEMETRY: Reviewed telemetry pt in NSR  ASSESSMENT AND PLAN: 79 yo presented with atrial fibrillation/RVR and acute systolic CHF.  1. Atrial fibrillation: Patient was cardioverted yesterday to NSR.  She is on Eliquis at 2.5 mg bid dosing (age > 80, weight < 60 kg).  I think that we can stop digoxin now that she is in NSR, continue Toprol XL.  2. Acute systolic CHF: EF 0000000 on echo (somewhat difficult as HR elevated when echo was done) and 30% on TEE. Troponin  negative x 3.  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  Given negative troponin and lack of chest pain, think reasonable to cardiovert to NSR and anticoagulate for 1 month with repeat echo.  If EF still low, will need cardiac cath. Continue lisinopril.  Toprol XL decreased to 25 mg bid yesterday given HR in 50s at times.  She appears euvolemic this morning and dyspnea has resolved.  Restart Lasix at 20 mg daily.   3. Disposition: Home today.  Will get home health to follow her.  Needs appointment in CHF clinic on a day that I am there in about 1 week with BMET. Meds: Toprol XL 25 mg bid, apixaban 2.5 mg bid,  Lisinopril 20 daily, Lasix 20 daily, Zocor 20 daily.   [name] 04/03/2013 7:35 AM

## 2013-04-03 NOTE — Progress Notes (Signed)
ANTICOAGULATION CONSULT NOTE - Follow Up Consult  Pharmacy Consult for apixaban Indication: atrial fibrillation  Labs:  Recent Labs  03/31/13 1325  04/01/13 0425 04/02/13 0405 04/03/13 0350  HGB  --   < > 13.0 13.0 12.5  HCT  --   --  37.0 37.7 37.0  PLT  --   --  250 252 248  HEPARINUNFRC 0.47  --  0.73*  --   --   CREATININE  --   --  1.20* 1.24* 1.07  < > = values in this interval not displayed.  Assessment: 79yo female formerly on a heparin infusion transitioned to apixaban. CBC stable on current apixaban dose. Dose not likely to change since weight and age were determining factors for lower dose.   Goal of Therapy:  Stroke prevention    Plan:  Apixaban 2.5mg  po BID (adjusted for age > 29 and weight <60kg) Pharmacy to sign off Will monitor patient CBC daily   Albertina Parr, PharmD.  [place] License [id number] Application for [place] reciprocity pending  Clinical Pharmacist Pager [phone]

## 2013-04-03 NOTE — Progress Notes (Signed)
Agree with the above assessment and plan. No adjustments needed for apixaban, will continue to follow peripherally. Discharge education has been completed.  Erin Hearing PharmD., BCPS Clinical Pharmacist Pager [phone] 04/03/2013 11:46 AM

## 2013-04-04 ENCOUNTER — Encounter (HOSPITAL_COMMUNITY): Payer: Self-pay | Admitting: Cardiovascular Disease

## 2013-04-04 LAB — BASIC METABOLIC PANEL
CO2: 24 mEq/L (ref 19–32)
Chloride: 103 mEq/L (ref 96–112)
GFR calc Af Amer: 56 mL/min — ABNORMAL LOW (ref >90)
Potassium: 4.2 mEq/L (ref 3.5–5.1)
Sodium: 136 mEq/L (ref 135–145)

## 2013-04-04 LAB — GLUCOSE, CAPILLARY
Glucose-Capillary: 151 mg/dL — ABNORMAL HIGH (ref 70–99)
Glucose-Capillary: 107 mg/dL — ABNORMAL HIGH (ref 70–99)

## 2013-04-04 MED ORDER — APIXABAN 2.5 MG PO TABS: 2.5000 mg | ORAL_TABLET | Freq: Two times a day (BID) | ORAL | Status: DC

## 2013-04-04 MED ORDER — METOPROLOL SUCCINATE ER 25 MG PO TB24: 25.0000 mg | ORAL_TABLET | Freq: Two times a day (BID) | ORAL | Status: DC

## 2013-04-04 MED ORDER — FUROSEMIDE 20 MG PO TABS: 20.0000 mg | ORAL_TABLET | Freq: Every day | ORAL | Status: DC

## 2013-04-04 NOTE — Progress Notes (Signed)
Patient ID: Patricia Randall, female   DOB: [birth year], 79 y.o.   MRN: [id number]    SUBJECTIVE:  Patient remains in NSR.  Unable to get ride home yesterday.   Marland Kitchen apixaban  2.5 mg Oral BID  . furosemide  20 mg Oral Daily  . glimepiride  1 mg Oral QAC breakfast  . insulin aspart  0-15 Units Subcutaneous TID WC  . latanoprost  1 drop Both Eyes QHS  . lisinopril  20 mg Oral Daily  . metoprolol succinate  25 mg Oral BID  . simvastatin  20 mg Oral q1800  . sodium chloride  3 mL Intravenous Q12H      Filed Vitals:   04/03/13 1454 04/03/13 1837 04/03/13 2032 04/04/13 0629  BP: 111/68 104/60 108/52 129/68  Pulse: 67 62 60 53  Temp: 98.6 F (37 C)  98 F (36.7 C)   TempSrc: Oral  Oral   Resp: 20  20 20   Height:      Weight:    60.646 kg (133 lb 11.2 oz)  SpO2: 100%  99% 100%    Intake/Output Summary (Last 24 hours) at 04/04/13 0749 Last data filed at 04/04/13 0653  Gross per 24 hour  Intake 842.33 ml  Output   2023 ml  Net -1180.67 ml    LABS: Basic Metabolic Panel:  Recent Labs  04/02/13 0405 04/03/13 0350 04/04/13 0510  NA 138 139 136  K 4.8 4.9 4.2  CL 103 103 103  CO2 20 26 24   GLUCOSE 103* 98 120*  BUN 30* 27* 26*  CREATININE 1.24* 1.07 1.05  CALCIUM 8.2* 8.4 8.7  MG 1.5 2.1  --    Liver Function Tests: No results found for this basename: AST, ALT, ALKPHOS, BILITOT, PROT, ALBUMIN,  in the last 72 hours No results found for this basename: LIPASE, AMYLASE,  in the last 72 hours CBC:  Recent Labs  04/02/13 0405 04/03/13 0350  WBC 6.7 6.4  HGB 13.0 12.5  HCT 37.7 37.0  MCV 80.7 81.7  PLT 252 248   Cardiac Enzymes: No results found for this basename: CKTOTAL, CKMB, CKMBINDEX, TROPONINI,  in the last 72 hours BNP: No components found with this basename: POCBNP,  D-Dimer: No results found for this basename: DDIMER,  in the last 72 hours Hemoglobin A1C: No results found for this basename: HGBA1C,  in the last 72 hours Fasting Lipid Panel: No results found  for this basename: CHOL, HDL, LDLCALC, TRIG, CHOLHDL, LDLDIRECT,  in the last 72 hours Thyroid Function Tests: No results found for this basename: TSH, T4TOTAL, FREET3, T3FREE, THYROIDAB,  in the last 72 hours Anemia Panel: No results found for this basename: VITAMINB12, FOLATE, FERRITIN, TIBC, IRON, RETICCTPCT,  in the last 72 hours  RADIOLOGY: Dg Chest Port 1 View  03/30/2013   CLINICAL DATA:  Short of breath  EXAM: PORTABLE CHEST - 1 VIEW  COMPARISON:  None.  FINDINGS: The cardiac silhouette is enlarged. The mediastinum is normal in contour. No hilar masses.  There is mild central vascular congestion. Coarse reticular opacities at the bases is likely due to atelectasis. No convincing infiltrate. There may be a small left effusion.  No pneumothorax.  IMPRESSION: Cardiomegaly. Vascular congestion without overt pulmonary edema. Mild lung base opacity likely atelectasis. Possible small left effusion. Mild congestive heart failure is suspected.   Electronically Signed   By: Lajean Manes M.D.   On: 03/30/2013 08:04    PHYSICAL EXAM General: NAD Neck: No JVD, no thyromegaly  or thyroid nodule.  Lungs: Clear to auscultation bilaterally with normal respiratory effort. CV: Nondisplaced PMI.  Heart regular S1/S2, no S3/S4, no murmur.  No peripheral edema.  No carotid bruit.  Normal pedal pulses.  Abdomen: Soft, nontender, no hepatosplenomegaly, no distention.  Neurologic: Alert and oriented x 3.  Psych: Normal affect. Extremities: No clubbing or cyanosis.   TELEMETRY: Reviewed telemetry pt in NSR  ASSESSMENT AND PLAN: 79 yo presented with atrial fibrillation/RVR and acute systolic CHF.  1. Atrial fibrillation: Patient was cardioverted to NSR.  She is on Eliquis at 2.5 mg bid dosing (age > 80, weight < 60 kg).  I think that we can stop digoxin now that she is in NSR, continue Toprol XL.  2. Acute systolic CHF: EF 0000000 on echo (somewhat difficult as HR elevated when echo was done) and 30% on TEE.  Troponin negative x 3.  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  Given negative troponin and lack of chest pain, think reasonable to cardiovert to NSR and anticoagulate for 1 month with repeat echo.  If EF still low, will need cardiac cath. Continue lisinopril.  Toprol XL decreased to 25 mg bid given HR in 50s at times.  She appears euvolemic this morning and dyspnea has resolved.  Restarted Lasix at 20 mg daily.   3. Disposition: Home today.  Will get home health to follow her.  Needs appointment in CHF clinic on a day that I am there in about 1 week with BMET. Meds: Toprol XL 25 mg bid, apixaban 2.5 mg bid,  Lisinopril 20 daily, Lasix 20 daily, Zocor 20 daily.   [name] 04/04/2013 7:49 AM

## 2013-04-04 NOTE — Discharge Summary (Signed)
Discharge Summary   Patient ID: Patricia Randall,  MRN: [id number], DOB/AGE: [birth year] 79 y.o.  Admit date: 03/30/2013 Discharge date: 04/04/2013  Primary Care Provider: Leonard Downing Primary Cardiologist: Einar Crow, MD ([facility])  Discharge Diagnoses Principal Problem:   Atrial fibrillation  **s/p TEE and DCCV this admission.  Active Problems:   Cardiomyopathy (presumably non-ischemic and possibly tachycardia mediated)  **EF 20-25% on echo   Acute systolic CHF (congestive heart failure), NYHA class 2  **net negative diuresis of 6.975 Liters with reduction in weight from 141 lbs on admission to 133 lbs at discharge.   Hypokalemia   Ventricular tachycardia, non-sustained   Hypomagnesemia   Type II DM Allergies No Known Allergies  Procedures  2D Echocardiogram 10.12.2014  Study Conclusions  - Study data: Interpretation is limited by tachycardia and   irregular rhythm. - Left ventricle: The cavity size was normal. Wall thickness   was normal. The estimated ejection fraction was in the   range of 20% to 25%. Systolic function may be   underestimated in the setting of severe tachycardia and an   irregular rhythm. Diffuse hypokinesis. The study is not   technically sufficient to allow evaluation of LV diastolic   function. - Regional wall motion abnormality: Akinesis of the mid   anterior, mid anteroseptal, mid inferoseptal, and apical   septal myocardium. - Aortic valve: Mildly calcified annulus. Trileaflet; mildly   thickened leaflets. Valve area: 1.99cm^2(VTI). Valve area:   2.52cm^2 (Vmax). - Mitral valve: Mild to moderate regurgitation. The MR vena   contracta is 0.4 cm. - Left atrium: The atrium was severely dilated. - Right ventricle: The cavity size was mildly dilated.   Systolic function was mildly reduced. RV TAPSE is 1.4 cm. - Right atrium: The atrium was severely dilated. - Tricuspid valve: Moderate regurgitation. The TR vena   contracta is 0.4 cm  . - Pulmonary arteries: Systolic pressure was moderately   increased. PA peak pressure: 25mm Hg (S). _____________   Transesophageal Echocardiogram and Cardioversion 10.13.2014  Procedure: Transesophageal Echocardiogram Indications: atrial fibrillation, chronic systolic chf  Procedure Details  Left Ventrical:  Severe LV dysfunction.  EF 30% Mitral Valve: normal Aortic Valve: normal Tricuspid Valve: trace TR Pulmonic Valve: trivial PI Left Atrium/ Left atrial appendage: large, no thrombus,  There is extensive "smoke" ( spontaneous contrast ) in the left atrium Atrial septum: intact by color doppler Aorta: mild calcification.  There is spontaneous contrast in the descending throacic aorta.    Cardioversion Note  Procedure Details  Synchronous cardioversion was performed at 120  joules. _____________   History of Present Illness  79 year old female without prior cardiac history. She was in her usual state of health until approximately 2-3 months prior to admission when she began to experience progressive dyspnea on exertion that became acutely worse over a two-week period prior to admission. She also began to experience PND, orthopnea, and mild lower extremity edema. She was told by her primary care provider that she had evidence of congestive heart failure and was started on Lasix therapy. On the evening prior to admission, she had significant dyspnea and orthopnea along with restlessness, which prompted [facility]. There, she was found to be in atrial fibrillation with rapid ventricular response and also was noted to have evidence of volume overload and pulmonary edema on chest x-ray. She was admitted for further evaluation and diuresis.  Hospital Course  Patient ruled out for myocardial infarction. With IV Lasix,  she had brisk diuresis and for this admission, had a net negative diuresis of 6.9 L with 8 pound weight loss. A  2-D echocardiogram was undertaken on October 12 and this showed systolic dysfunction with an EF of 20-25%. She remained in atrial fibrillation and rates were somewhat difficult to control. In the setting of reduced LV function, intravenous diltiazem was discontinued in favor of oral beta blocker therapy. Oral digoxin therapy was also added. She was initially placed on heparin therapy in case she were to require any invasive procedures but in the absence of chest pain and with normal cardiac markers, heparin was transitioned to eliquis 2.5mg  bid.  In light of LV dysfunction, decision was made to pursue transesophageal echocardiogram and cardioversion. This was performed on October 13 with transesophageal echocardiogram showing no evidence of left atrial appendage thrombus. She was then successfully cardioverted and has since maintained sinus rhythm. Her Lasix has been transitioned from IV to by mouth and even required holding secondary to a mild bump in creatinine. We have resumed Lasix 20 mg daily. In the setting of diuresis, she did have intermittent hypokalemia, hypomagnesemia, and was also noted to have asymptomatic nonsustained ventricular tachycardia. With supplementation of electrolytes and beta blocker therapy, she has not had any further ventricular arrhythmias.  [name] will be discharged home today in good condition. We have arranged for followup next week in congestive heart failure clinic at which time a basic metabolic panel will be reevaluated. We will plan to reevaluate LV function in the next month and if his ejection fraction remains depressed, we will likely plan diagnostic catheterization to evaluate for ischemic causes of LV dysfunction.  Discharge Vitals Blood pressure 113/75, pulse 62, temperature 98 F (36.7 C), temperature source Oral, resp. rate 18, height 5\' 5"  (1.651 m), weight 133 lb 11.2 oz (60.646 kg), SpO2 100.00%.  Filed Weights   04/02/13 0545 04/03/13 0500 04/04/13 0629    Weight: 131 lb (59.421 kg) 132 lb 15 oz (60.3 kg) 133 lb 11.2 oz (60.646 kg)   Labs  CBC  Recent Labs  04/02/13 0405 04/03/13 0350  WBC 6.7 6.4  HGB 13.0 12.5  HCT 37.7 37.0  MCV 80.7 81.7  PLT 252 [id number]   Basic Metabolic Panel  Recent Labs  04/02/13 0405 04/03/13 0350 04/04/13 0510  NA 138 139 136  K 4.8 4.9 4.2  CL 103 103 103  CO2 20 26 24   GLUCOSE 103* 98 120*  BUN 30* 27* 26*  CREATININE 1.24* 1.07 1.05  CALCIUM 8.2* 8.4 8.7  MG 1.5 2.1  --    Liver Function Tests Lab Results  Component Value Date   ALT 46* 03/30/2013   AST 28 03/30/2013   ALKPHOS 47 03/30/2013   BILITOT 0.6 03/30/2013   Cardiac Enzymes Lab Results  Component Value Date   TROPONINI <0.30 03/31/2013   Fasting Lipid Panel Lab Results  Component Value Date   CHOL 126 03/31/2013   HDL 62 03/31/2013   LDLCALC 51 03/31/2013   TRIG 64 03/31/2013   CHOLHDL 2.0 03/31/2013   Thyroid Function Tests Lab Results  Component Value Date   TSH 1.190 03/30/2013    Disposition  Pt is being discharged home today in good condition.  Follow-up Plans & Appointments      Follow-up Information   Follow up with [facility] On 04/12/2013. (Dr. Aundra Dubin - 3:40 Garage Code: [id number])    Contact information:   [address] [place] [address] [phone]  Follow up with Leonard Downing, MD. (as scheduled.)    Specialty:  [facility]   Contact information:   [place] [address] [phone]      Discharge Medications    Medication List    STOP taking these medications       atenolol 100 MG tablet  Commonly known as:  TENORMIN     hydrochlorothiazide 25 MG tablet  Commonly known as:  HYDRODIURIL      TAKE these medications       apixaban 2.5 MG Tabs tablet  Commonly known as:  ELIQUIS  Take 1 tablet (2.5 mg total) by mouth 2 (two) times daily.     furosemide 20 MG tablet  Commonly known as:  LASIX  Take 1 tablet (20 mg total)  by mouth daily.     glimepiride 1 MG tablet  Commonly known as:  AMARYL  Take 1 mg by mouth daily before breakfast.     latanoprost 0.005 % ophthalmic solution  Commonly known as:  XALATAN  Place 1 drop into both eyes at bedtime.     lisinopril 20 MG tablet  Commonly known as:  PRINIVIL,ZESTRIL  Take 20 mg by mouth daily.     lovastatin 20 MG tablet  Commonly known as:  MEVACOR  Take 40 mg by mouth at bedtime.     metFORMIN 1000 MG tablet  Commonly known as:  GLUCOPHAGE  Take 500-1,000 mg by mouth 2 (two) times daily with a meal. Take 1 tablet every morning and take 1/2 tablet at supper     metoprolol succinate 25 MG 24 hr tablet  Commonly known as:  TOPROL-XL  Take 1 tablet (25 mg total) by mouth 2 (two) times daily.       Outstanding Labs/Studies  bmet upon follow-up in CHF clinic.  Duration of Discharge Encounter   Greater than 30 minutes including physician time.  Signed, Murray Hodgkins NP 04/04/2013, 11:05 AM

## 2013-04-12 ENCOUNTER — Ambulatory Visit (HOSPITAL_COMMUNITY)
Admission: RE | Admit: 2013-04-12 | Discharge: 2013-04-12 | Disposition: A | Discharge status: Home / Self Care | Payer: Medicare Other | Source: Ambulatory Visit | Attending: Internal Medicine | Admitting: Internal Medicine

## 2013-04-12 VITALS — BP 152/84 | HR 50 | Wt 139.2 lb

## 2013-04-12 DIAGNOSIS — I509 Heart failure, unspecified: Secondary | ICD-10-CM | POA: Insufficient documentation

## 2013-04-12 DIAGNOSIS — I5032 Chronic diastolic (congestive) heart failure: Secondary | ICD-10-CM | POA: Insufficient documentation

## 2013-04-12 DIAGNOSIS — I4891 Unspecified atrial fibrillation: Secondary | ICD-10-CM | POA: Insufficient documentation

## 2013-04-12 DIAGNOSIS — I5022 Chronic systolic (congestive) heart failure: Secondary | ICD-10-CM | POA: Insufficient documentation

## 2013-04-12 MED ORDER — LISINOPRIL 20 MG PO TABS: ORAL_TABLET | ORAL | Status: DC

## 2013-04-12 NOTE — Patient Instructions (Signed)
Increase Lisinopril to 20 mg (1 tab) in AM and 10 mg (1/2 tab) in PM  Your physician recommends that you schedule a follow-up appointment in: 1 month with echocardiogram

## 2013-04-12 NOTE — Progress Notes (Signed)
Patient ID: Patricia Randall, female   DOB: 1928/10/05, 79 y.o.   MRN: [id number] PCP: Dr. Arelia Sneddon  79 yo with history of DM and HTN was admitted earlier this month with atrial fibrillation/RVR and acute systolic CHF.  Patient had been short of breath and orthopneic for several days prior to the admission.  She did not feel tachypalptitations.  In the ER, she was noted to be in atrial fibrillation with RVR.  Rate was controlled and she was diuresed.  EF 20-25% on echo with anterior/anteroseptal wall motion abnormalities.  She did not have chest pain and troponin was not elevated.  She started on apixaban and TEE-guided DCCV was done.  She remains in NSR today.   Since getting home, she has been doing well.  She feels back to normal.  No orthopnea or PND.  No dyspnea: she is able to walk around her house and in stores without problems.  No lightheadedness/syncope.  No tachypalpitations.  No chest pain.  HR is mildly decreased today, around 50 bpm.   ECG: Sinus brady, left axis deviation, inferior and anterolateral TWIs  Labs (10/14): K 4.2, creatinine 1.05, LDL 57, HDL 62  PMH: 1. Type II diabetes 2. HTN 3. Hyperlipidemia 4. Atrial fibrillation: First noted in 10/14 (admitted with CHF, afib RVR).  TEE-guided DCCV back to NSR.   5. Chronic systolic CHF: CHF exacerbation in 10/14 in setting of atrial fibrillation with RVR.  Troponin negative.  Echo (10/14) with EF 20-25% in setting of tachycardia, mid-apical anterior and anteroseptal akinesis, mild to moderate MR, mildly dilated RV with mildly decreased RV systolic function, moderate TR, PA systolic pressure 41 mmHg.  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   SH: Widow, lives with son in [place], nonsmoker.   FH: Diabetes.  ROS: All systems reviewed and negative except as per HPI.   Current Outpatient Prescriptions  Medication Sig Dispense Refill  . apixaban (ELIQUIS) 2.5 MG TABS tablet Take 1 tablet (2.5 mg total) by mouth 2 (two) times daily.  60  tablet  6  . furosemide (LASIX) 20 MG tablet Take 1 tablet (20 mg total) by mouth daily.  30 tablet  6  . glimepiride (AMARYL) 1 MG tablet Take 1 mg by mouth daily before breakfast.      . latanoprost (XALATAN) 0.005 % ophthalmic solution Place 1 drop into both eyes at bedtime.      Marland Kitchen lisinopril (PRINIVIL,ZESTRIL) 20 MG tablet Take 1 tab in AM and 1/2 tab in PM  45 tablet  3  . lovastatin (MEVACOR) 20 MG tablet Take 40 mg by mouth at bedtime.      . metFORMIN (GLUCOPHAGE) 1000 MG tablet Take 1 tablet every morning and take 1/2 tablet at supper      . metoprolol succinate (TOPROL-XL) 25 MG 24 hr tablet Take 1 tablet (25 mg total) by mouth 2 (two) times daily.  60 tablet  6   No current facility-administered medications for this encounter.    BP 152/84  Pulse 50  Wt 139 lb 4 oz (63.163 kg)  BMI 23.17 kg/m2  SpO2 100% General: NAD Neck: No JVD, no thyromegaly or thyroid nodule.  Lungs: Clear to auscultation bilaterally with normal respiratory effort. CV: Nondisplaced PMI.  Heart regular S1/S2, no S3/S4, no murmur.  No peripheral edema.  No carotid bruit.  Normal pedal pulses.  Abdomen: Soft, nontender, no hepatosplenomegaly, no distention.  Skin: Intact without lesions or rashes.  Neurologic: Alert and oriented x 3.  Psych: Normal affect. Extremities: No  clubbing or cyanosis.   Assessment/Plan: 1. Atrial fibrillation: She remains in NSR after cardioversion.  Continue low dose Toprol XL.  Will not titrate up due to bradycardia.  Continue reduced dose apixaban (advanced age and low weight).  2. Chronic systolic CHF: EF 0000000 on echo while in afib with RVR.  It is possible that this was a tachy-mediated cardiomyopathy.  No MI-like event in the past, troponin was negative in the hospital.  However, she did have regional wall motion abnormalities.  Currently NYHA class II and euvolemic.   - Continue current Toprol XL - Increase lisinopril to 20 qpm, 10 qpm with BMET in 10 days.  - Continue  current Lasix dose.  - Repeat echo in 1 month while in NSR.  If EF back to normal, probably no further workup.  If EF depressed and no ischemic symptoms, would do Cardiolite.  If EF depressed with ischemic symptoms, cardiac cath.   [name] 04/12/2013 1:43 PM

## 2013-04-13 NOTE — Addendum Note (Signed)
Encounter addended by: Evalee Mutton on: 04/13/2013  8:36 AM<BR>     Documentation filed: Charges VN

## 2013-05-14 ENCOUNTER — Other Ambulatory Visit (HOSPITAL_COMMUNITY): Payer: Self-pay | Admitting: Internal Medicine

## 2013-05-14 ENCOUNTER — Ambulatory Visit (HOSPITAL_COMMUNITY)
Admission: RE | Admit: 2013-05-14 | Discharge: 2013-05-14 | Disposition: A | Discharge status: Home / Self Care | Payer: Medicare Other | Source: Ambulatory Visit | Attending: Cardiology | Admitting: Cardiology

## 2013-05-14 ENCOUNTER — Ambulatory Visit (HOSPITAL_BASED_OUTPATIENT_CLINIC_OR_DEPARTMENT_OTHER)
Admission: RE | Admit: 2013-05-14 | Discharge: 2013-05-14 | Disposition: A | Discharge status: Home / Self Care | Payer: Medicare Other | Source: Ambulatory Visit | Attending: Internal Medicine | Admitting: Internal Medicine

## 2013-05-14 VITALS — BP 150/88 | HR 58 | Wt 144.0 lb

## 2013-05-14 DIAGNOSIS — I509 Heart failure, unspecified: Secondary | ICD-10-CM | POA: Insufficient documentation

## 2013-05-14 DIAGNOSIS — I4891 Unspecified atrial fibrillation: Secondary | ICD-10-CM | POA: Insufficient documentation

## 2013-05-14 DIAGNOSIS — Z79899 Other long term (current) drug therapy: Secondary | ICD-10-CM | POA: Insufficient documentation

## 2013-05-14 DIAGNOSIS — I5022 Chronic systolic (congestive) heart failure: Secondary | ICD-10-CM

## 2013-05-14 DIAGNOSIS — I059 Rheumatic mitral valve disease, unspecified: Secondary | ICD-10-CM

## 2013-05-14 DIAGNOSIS — E785 Hyperlipidemia, unspecified: Secondary | ICD-10-CM | POA: Insufficient documentation

## 2013-05-14 DIAGNOSIS — I10 Essential (primary) hypertension: Secondary | ICD-10-CM | POA: Insufficient documentation

## 2013-05-14 DIAGNOSIS — E119 Type 2 diabetes mellitus without complications: Secondary | ICD-10-CM | POA: Insufficient documentation

## 2013-05-14 LAB — BASIC METABOLIC PANEL
CO2: 28 mEq/L (ref 19–32)
Calcium: 9.9 mg/dL (ref 8.4–10.5)
Creatinine, Ser: 1.1 mg/dL (ref 0.50–1.10)
GFR calc Af Amer: 53 mL/min — ABNORMAL LOW (ref >90)
GFR calc non Af Amer: 45 mL/min — ABNORMAL LOW (ref >90)
Glucose, Bld: 70 mg/dL (ref 70–99)

## 2013-05-14 MED ORDER — CARVEDILOL 6.25 MG PO TABS: 6.2500 mg | ORAL_TABLET | Freq: Two times a day (BID) | ORAL | Status: DC

## 2013-05-14 NOTE — Progress Notes (Signed)
Echo Lab  2D Echocardiogram completed.  Destine Zirkle L Carroll Ranney, RDCS 05/14/2013 11:18 AM

## 2013-05-14 NOTE — Progress Notes (Signed)
Patient ID: Patricia Randall, female   DOB: [birth year], 79 y.o.   MRN: [id number] PCP: Dr. Arelia Sneddon  79 yo with history of DM and HTN was admitted earlier this month with atrial fibrillation/RVR and acute systolic CHF.  Patient had been short of breath and orthopneic for several days prior to the admission.  She did not feel tachypalptitations.  In the ER, she was noted to be in atrial fibrillation with RVR.  Rate was controlled and she was diuresed.  EF 20-25% on echo with anterior/anteroseptal wall motion abnormalities.  She did not have chest pain and troponin was not elevated.  She started on apixaban and 04/02/13 TEE-guided DCCV was done.    She returns for follow up. Last visit lisinopril was increased to 20 mg in am and  Continued on 10 mg in pm. Feels much better.  Denies CP/SOB/PND/Orthopnea or palpitations. Able to walk up steps.  Weight at 142-143 pounds. Taking all medications. Having a hard time paying for Toprol XL and eliquis.    ECHO 05/14/13 EF 25-30% Lateral wall moves well. All other hypokinetic  Labs (10/14): K 4.2, creatinine 1.05, LDL 57, HDL 62 Labs (04/04/13) K 4.2 Creatinine 1.0   PMH: 1. Type II diabetes 2. HTN 3. Hyperlipidemia 4. Atrial fibrillation: First noted in 10/14 (admitted with CHF, afib RVR).  TEE-guided DCCV back to NSR.   5. Chronic systolic CHF: CHF exacerbation in 10/14 in setting of atrial fibrillation with RVR.  Troponin negative.  Echo (10/14) with EF 20-25% in setting of tachycardia, mid-apical anterior and anteroseptal akinesis, mild to moderate MR, mildly dilated RV with mildly decreased RV systolic function, moderate TR, PA systolic pressure 41 mmHg.  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   SH: Widow, lives with son in [place], nonsmoker.   FH: Diabetes.  ROS: All systems reviewed and negative except as per HPI.   Current Outpatient Prescriptions  Medication Sig Dispense Refill  . apixaban (ELIQUIS) 2.5 MG TABS tablet Take 1 tablet (2.5 mg total) by  mouth 2 (two) times daily.  60 tablet  6  . furosemide (LASIX) 20 MG tablet Take 1 tablet (20 mg total) by mouth daily.  30 tablet  6  . glimepiride (AMARYL) 1 MG tablet Take 1 mg by mouth daily before breakfast.      . latanoprost (XALATAN) 0.005 % ophthalmic solution Place 1 drop into both eyes at bedtime.      Marland Kitchen lisinopril (PRINIVIL,ZESTRIL) 20 MG tablet Take 1 tab in AM and 1/2 tab in PM  45 tablet  3  . lovastatin (MEVACOR) 20 MG tablet Take 40 mg by mouth at bedtime.      . metFORMIN (GLUCOPHAGE) 1000 MG tablet Take 1 tablet every morning and take 1/2 tablet at supper      . metoprolol succinate (TOPROL-XL) 25 MG 24 hr tablet Take 1 tablet (25 mg total) by mouth 2 (two) times daily.  60 tablet  6   No current facility-administered medications for this encounter.    Wt 144 lb (65.318 kg) General: NAD Neck: No JVD, no thyromegaly or thyroid nodule.  Lungs: Clear to auscultation bilaterally with normal respiratory effort. CV: Nondisplaced PMI.  Heart regular S1/S2, no S3/S4, no murmur.  No peripheral edema.  No carotid bruit.  Normal pedal pulses.  Abdomen: Soft, nontender, no hepatosplenomegaly, no distention.  Skin: Intact without lesions or rashes.  Neurologic: Alert and oriented x 3.  Psych: Normal affect. Extremities: No clubbing or cyanosis.   Assessment/Plan: 1. Atrial fibrillation: She  remains in NSR after cardioversion.   Will need to switch from Toprol XL 25 mg twice a day to carvedilol 6.25 mg twice a day due to cost. Continue reduced dose apixaban (advanced age and low weight).  2. Chronic systolic CHF: EF 0000000 on echo while in afib with RVR.  ? tachy-mediated cardiomyopathy.  No MI-like event in the past, troponin was negative in the hospital. ECHO today EF remains 25-30% . Would benefit from will need LHC but she prefers stress test. to evaluated coronaries.  NYHA I.  -Volume status stable.  Continue lasix 20 mg daily  - Switch to carvedilol due to cost. Start carvedilol  6.25 mg twice a day.  - Continue lisinopril to 20 mg in am and 10 mg in pm.    Follow up 1 month.    CLEGG,[name] 05/14/2013 11:16 AM   Patient seen and examined with Darrick Grinder, NP. We discussed all aspects of the encounter. I agree with the assessment and plan as stated above.   She is much improved symptomatically with restoration of NSR. Volume status looks good. Echo reviewed personally. EF still depressed with multiple wall motion abnormalities. Given long h/o DM2 she will need evaluation for underlying CAD. I have suggested cardiac cath and we discussed the risks and indications but she says she is not interested in this. Will risk stratify with Lexiscan Myoview.  Can switch Toprol to carvedilol due to cost. Continue Eliquis.   Kalai Baca,MD 12:05 PM

## 2013-05-14 NOTE — Patient Instructions (Signed)
Follow up in 1 month  Stop taking toprol   Take carvedilol 6.25 mg twice a day   Do the following things EVERYDAY: 1) Weigh yourself in the morning before breakfast. Write it down and keep it in a log. 2) Take your medicines as prescribed 3) Eat low salt foods-Limit salt (sodium) to 2000 mg per day.  4) Stay as active as you can everyday 5) Limit all fluids for the day to less than 2 liters

## 2013-05-22 ENCOUNTER — Telehealth: Payer: Self-pay | Admitting: Physician Assistant

## 2013-05-22 NOTE — Telephone Encounter (Signed)
Patient w/ question re: stress test tomorrow and medications to take. Advised to hold BB. She is going to hold AM meds altogether so as to not get confused. Nuclear stress planned. Advised to avoid cigarettes, food and caffeine after midnight. She understood and was appreciative.    Jacquelynn Cree, PA-C 05/22/2013 7:55 PM

## 2013-05-23 ENCOUNTER — Ambulatory Visit (HOSPITAL_COMMUNITY): Payer: Medicare Other | Attending: Internal Medicine | Admitting: Radiology

## 2013-05-23 ENCOUNTER — Encounter: Payer: Self-pay | Admitting: Cardiology

## 2013-05-23 VITALS — BP 151/76 | Ht 62.0 in | Wt 142.0 lb

## 2013-05-23 DIAGNOSIS — R0602 Shortness of breath: Secondary | ICD-10-CM | POA: Insufficient documentation

## 2013-05-23 DIAGNOSIS — R0609 Other forms of dyspnea: Secondary | ICD-10-CM | POA: Insufficient documentation

## 2013-05-23 DIAGNOSIS — E785 Hyperlipidemia, unspecified: Secondary | ICD-10-CM | POA: Insufficient documentation

## 2013-05-23 DIAGNOSIS — R002 Palpitations: Secondary | ICD-10-CM | POA: Insufficient documentation

## 2013-05-23 DIAGNOSIS — I4891 Unspecified atrial fibrillation: Secondary | ICD-10-CM | POA: Insufficient documentation

## 2013-05-23 DIAGNOSIS — R0989 Other specified symptoms and signs involving the circulatory and respiratory systems: Secondary | ICD-10-CM | POA: Insufficient documentation

## 2013-05-23 DIAGNOSIS — I5022 Chronic systolic (congestive) heart failure: Secondary | ICD-10-CM

## 2013-05-23 DIAGNOSIS — E119 Type 2 diabetes mellitus without complications: Secondary | ICD-10-CM | POA: Insufficient documentation

## 2013-05-23 DIAGNOSIS — I10 Essential (primary) hypertension: Secondary | ICD-10-CM | POA: Insufficient documentation

## 2013-05-23 MED ORDER — TECHNETIUM TC 99M SESTAMIBI GENERIC - CARDIOLITE
30.0000 | Freq: Once | INTRAVENOUS | Status: AC | PRN
  Administered 2013-05-23: 30 via INTRAVENOUS

## 2013-05-23 MED ORDER — REGADENOSON 0.4 MG/5ML IV SOLN
0.4000 mg | Freq: Once | INTRAVENOUS | Status: AC
  Administered 2013-05-23: 0.4 mg via INTRAVENOUS

## 2013-05-23 MED ORDER — TECHNETIUM TC 99M SESTAMIBI GENERIC - CARDIOLITE
10.0000 | Freq: Once | INTRAVENOUS | Status: AC | PRN
  Administered 2013-05-23: 10 via INTRAVENOUS

## 2013-05-23 NOTE — Progress Notes (Signed)
[facility] [address] [place], [place] [address] [phone]    Cardiology Nuclear Med Study  Patricia Randall is a 79 y.o. female     MRN : [id number]     DOB: [birth year]  Procedure Date: 05/23/2013  Nuclear Med Background Indication for Stress Test:  Evaluation for Ischemia History:   AFIB 05/14/13 ECHO: EF: 25-30% Cardiac Risk Factors: Hypertension, Lipids and NIDDM  Symptoms: DOE, Palpitations and SOB   Nuclear Pre-Procedure Caffeine/Decaff Intake:  None > 12 hrs NPO After: 10:00pm   Lungs:  clear O2 Sat: 97% on room air. IV 0.9% NS with Angio Cath:  22g  IV Site: R Antecubital x , tolerated well IV Started by:  Irven Baltimore, RN  Chest Size (in):  36 Cup Size: C  Height: 5\' 2"  (1.575 m)  Weight:  142 lb (64.411 kg)  BMI:  Body mass index is 25.97 kg/(m^2). Tech Comments:  Held coreg this am; no medications today    Nuclear Med Study 1 or 2 day study: 1 day  Stress Test Type:  Lexiscan  Reading MD: Darlin Coco, MD  Order Authorizing Provider:  Glori Bickers, MD  Resting Radionuclide: Technetium 25m Sestamibi  Resting Radionuclide Dose: 11.0 mCi   Stress Radionuclide:  Technetium 1m Sestamibi  Stress Radionuclide Dose: 33.0 mCi           Stress Protocol Rest HR: 60 Stress HR: 76  Rest BP: 151/76 Stress BP: 152/76  Exercise Time (min): n/a METS: n/a   Predicted Max HR: 138 bpm % Max HR: 67.39 bpm Rate Pressure Product: 14229   Dose of Adenosine (mg):  n/a Dose of Lexiscan: 0.4 mg  Dose of Atropine (mg): n/a Dose of Dobutamine: n/a mcg/kg/min (at max HR)  Stress Test Technologist: Perrin Maltese, EMT-P  Nuclear Technologist:  Jennelle Human, CNMT     Rest Procedure:  Myocardial perfusion imaging was performed at rest 45 minutes following the intravenous administration of Technetium 76m Sestamibi. Rest ECG: NSR with deep inferolateral T wave inversions  Stress Procedure:  The patient received IV Lexiscan 0.4 mg over  15-seconds.  Technetium 45m Sestamibi injected at 30-seconds. This patient was sob and abdominal pain with the Lexiscan injection. Quantitative spect images were obtained after a 45 minute delay. Stress ECG: No significant change from baseline ECG  QPS Raw Data Images:  Normal; no motion artifact; normal heart/lung ratio. Stress Images:  There is mild apical thinning with normal uptake in other regions. Rest Images:  There is mild apical thinning with normal uptake in other regions. Subtraction (SDS):  No reversibility is appreciated. Transient Ischemic Dilatation (Normal <1.22):  1.10 Lung/Heart Ratio (Normal <0.45):  0.28  Quantitative Gated Spect Images QGS EDV:  146 ml QGS ESV:  84 ml  Impression Exercise Capacity:  Lexiscan with no exercise. BP Response:  Normal blood pressure response. Clinical Symptoms:  No chest pain. ECG Impression: No change from resting EKG Comparison with Prior Nuclear Study: No previous nuclear study performed  Overall Impression:  Low risk stress nuclear study.  There is a small apical defect seen on stress and rest images. This appears to represent physiologic apical thinning although a small old apical infarct cannot be excluded. There is no reversible ischemia. LV systolic function is depressed with EF 42%. Prior echo 05/14/13 had shown EF 25-30 %.  LV Ejection Fraction: 42%.  LV Wall Motion:  Mild global hypokinesis.  Darlin Coco

## 2013-05-29 ENCOUNTER — Ambulatory Visit (HOSPITAL_COMMUNITY)
Admission: RE | Admit: 2013-05-29 | Discharge: 2013-05-29 | Disposition: A | Discharge status: Home / Self Care | Payer: Medicare Other | Source: Ambulatory Visit | Attending: Internal Medicine | Admitting: Internal Medicine

## 2013-05-29 VITALS — BP 142/78 | HR 54 | Wt 144.2 lb

## 2013-05-29 DIAGNOSIS — I5022 Chronic systolic (congestive) heart failure: Secondary | ICD-10-CM

## 2013-05-29 DIAGNOSIS — I509 Heart failure, unspecified: Secondary | ICD-10-CM | POA: Insufficient documentation

## 2013-05-29 DIAGNOSIS — I4891 Unspecified atrial fibrillation: Secondary | ICD-10-CM

## 2013-05-29 NOTE — Progress Notes (Signed)
Patient ID: Patricia Randall, female   DOB: [birth year], 79 y.o.   MRN: [id number] PCP: Dr. Arelia Sneddon  79 yo with history of DM and HTN was admitted in October 2014  with atrial fibrillation/RVR and acute systolic CHF.  October 2014 EF 20-25% on echo with anterior/anteroseptal wall motion abnormalities. She was started on apixaban and had 04/02/13 TEE-guided DCCV.     She returns for follow up. Last visit Toprol XL was switched to carvedilol due to cost. Denies SOB/PND/Orthopnea/CP. No bleeding problems. Able to walk up steps. Weight at home has been 139-141 pounds. Taking all medications.   ECHO 05/14/13 EF 25-30% Lateral wall moves well. All other hypokinetic  Myoview: Low risk stress test  Small apical defect. EF 42% Mild global hypokinesis  Labs (10/14): K 4.2, creatinine 1.05, LDL 57, HDL 62 Labs (04/04/13) K 4.2 Creatinine 1.0  Labs (05/14/13) K 4.9 Creatinine 1.1  PMH: 1. Type II diabetes 2. HTN 3. Hyperlipidemia 4. Atrial fibrillation: First noted in 10/14 (admitted with CHF, afib RVR).  TEE-guided DCCV back to NSR.   5. Chronic systolic CHF: CHF exacerbation in 10/14 in setting of atrial fibrillation with RVR.  Troponin negative.  Echo (10/14) with EF 20-25% in setting of tachycardia, mid-apical anterior and anteroseptal akinesis, mild to moderate MR, mildly dilated RV with mildly decreased RV systolic function, moderate TR, PA systolic pressure 41 mmHg.  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   SH: Widow, lives with son in [place], nonsmoker.   FH: Diabetes.  ROS: All systems reviewed and negative except as per HPI.   Current Outpatient Prescriptions  Medication Sig Dispense Refill  . apixaban (ELIQUIS) 2.5 MG TABS tablet Take 1 tablet (2.5 mg total) by mouth 2 (two) times daily.  60 tablet  6  . carvedilol (COREG) 6.25 MG tablet Take 1 tablet (6.25 mg total) by mouth 2 (two) times daily with a meal.  60 tablet  6  . furosemide (LASIX) 20 MG tablet Take 1 tablet (20 mg total) by mouth  daily.  30 tablet  6  . glimepiride (AMARYL) 1 MG tablet Take 1 mg by mouth daily before breakfast.      . latanoprost (XALATAN) 0.005 % ophthalmic solution Place 1 drop into both eyes at bedtime.      Marland Kitchen lisinopril (PRINIVIL,ZESTRIL) 20 MG tablet Take 1 tab in AM and 1/2 tab in PM  45 tablet  3  . lovastatin (MEVACOR) 20 MG tablet Take 40 mg by mouth at bedtime.      . metFORMIN (GLUCOPHAGE) 1000 MG tablet Take 1 tablet every morning and take 1/2 tablet at supper       No current facility-administered medications for this encounter.    BP 142/78  Pulse 54  Wt 144 lb 4 oz (65.431 kg)  SpO2 100% General: NAD Neck: No JVD, no thyromegaly or thyroid nodule.  Lungs: Clear to auscultation bilaterally with normal respiratory effort. CV: Nondisplaced PMI.  Heart regular S1/S2, no S3/S4, no murmur.  No peripheral edema.  No carotid bruit.  Normal pedal pulses.  Abdomen: Soft, nontender, no hepatosplenomegaly, no distention.  Skin: Intact without lesions or rashes.  Neurologic: Alert and oriented x 3.  Psych: Normal affect. Extremities: No clubbing or cyanosis.   Assessment/Plan: 1. Atrial fibrillation: Maintaining SR after DC-CV. Continue carvedilol 6.25 mg twice a day.  Continue reduced dose apixaban (advanced age and low weight).   2. Chronic systolic CHF: ECHO EF 0000000 on echo while in afib with RVR.  ? tachy-mediated cardiomyopathy.  Myoview :  low risk EF 42%    NYHA I.  -Volume status stable.  Continue lasix 20 mg daily  - Continue  carvedilol 6.25 mg twice a day will not increase due to HR < 60  - Continue lisinopril to 20 mg in am and 10 mg in pm. Will not increase increase with recent elevated poptassium noted on last BMET 05/14/13  Follow up 2-3 months     CLEGG,[name]  NP-C  05/29/2013 10:26 AM  Patient seen and examined with Darrick Grinder, NP. We discussed all aspects of the encounter. I agree with the assessment and plan as stated above.   Much improved after cardioversion. EF  improving on Myoview - which was low risk. Suspect tachy-induced CM. NYHA I. Volume status looks good. Unable to titrate meds at this point. Tolerating apixaban without bleeding.   Selmer Adduci,MD 11:45 AM

## 2013-05-29 NOTE — Patient Instructions (Signed)
Follow up in 2-3 months  Do the following things EVERYDAY: 1. Weigh yourself in the morning before breakfast. Write it down and keep it in a log. 2. Take your medicines as prescribed 3. Eat low salt foods-Limit salt (sodium) to 2000 mg per day.  4. Stay as active as you can everyday 5. Limit all fluids for the day to less than 2 liters 

## 2013-08-25 ENCOUNTER — Other Ambulatory Visit (HOSPITAL_COMMUNITY): Payer: Self-pay | Admitting: Cardiology

## 2013-09-18 ENCOUNTER — Encounter (HOSPITAL_COMMUNITY): Payer: Self-pay | Admitting: Cardiology

## 2013-09-27 ENCOUNTER — Telehealth: Payer: Self-pay | Admitting: *Deleted

## 2013-09-27 ENCOUNTER — Other Ambulatory Visit: Payer: Self-pay | Admitting: *Deleted

## 2013-09-27 ENCOUNTER — Ambulatory Visit (HOSPITAL_COMMUNITY)
Admission: RE | Admit: 2013-09-27 | Discharge: 2013-09-27 | Disposition: A | Discharge status: Home / Self Care | Payer: Medicare Other | Source: Ambulatory Visit | Attending: Internal Medicine | Admitting: Internal Medicine

## 2013-09-27 VITALS — BP 124/66 | HR 78 | Wt 148.0 lb

## 2013-09-27 DIAGNOSIS — I509 Heart failure, unspecified: Secondary | ICD-10-CM

## 2013-09-27 DIAGNOSIS — E785 Hyperlipidemia, unspecified: Secondary | ICD-10-CM | POA: Insufficient documentation

## 2013-09-27 DIAGNOSIS — I5022 Chronic systolic (congestive) heart failure: Secondary | ICD-10-CM

## 2013-09-27 DIAGNOSIS — I4891 Unspecified atrial fibrillation: Secondary | ICD-10-CM

## 2013-09-27 LAB — LIPID PANEL
CHOL/HDL RATIO: 2 ratio
CHOLESTEROL: 120 mg/dL (ref 0–200)
HDL: 60 mg/dL (ref >39)
LDL Cholesterol: 44 mg/dL (ref 0–99)
Triglycerides: 79 mg/dL (ref <150)
VLDL: 16 mg/dL (ref 0–40)

## 2013-09-27 LAB — BASIC METABOLIC PANEL
BUN: 33 mg/dL — ABNORMAL HIGH (ref 6–23)
CO2: 23 meq/L (ref 19–32)
CREATININE: 1.3 mg/dL — AB (ref 0.50–1.10)
Calcium: 9.7 mg/dL (ref 8.4–10.5)
Chloride: 102 mEq/L (ref 96–112)
GFR calc Af Amer: 43 mL/min — ABNORMAL LOW (ref >90)
GFR calc non Af Amer: 37 mL/min — ABNORMAL LOW (ref >90)
Glucose, Bld: 64 mg/dL — ABNORMAL LOW (ref 70–99)
Potassium: 4.2 mEq/L (ref 3.7–5.3)
Sodium: 142 mEq/L (ref 137–147)

## 2013-09-27 LAB — PRO B NATRIURETIC PEPTIDE: PRO B NATRI PEPTIDE: 24.2 pg/mL (ref 0–450)

## 2013-09-27 MED ORDER — RIVAROXABAN 15 MG PO TABS: 15.0000 mg | ORAL_TABLET | Freq: Two times a day (BID) | ORAL | Status: DC

## 2013-09-27 MED ORDER — RIVAROXABAN 15 MG PO TABS: 15.0000 mg | ORAL_TABLET | Freq: Every day | ORAL | Status: DC

## 2013-09-27 MED ORDER — CARVEDILOL 6.25 MG PO TABS: 9.3750 mg | ORAL_TABLET | Freq: Two times a day (BID) | ORAL | Status: DC

## 2013-09-27 NOTE — Telephone Encounter (Signed)
PA to [facility] rx for xarelto

## 2013-09-27 NOTE — Patient Instructions (Signed)
Increase Carvedilol to 9.375 mg (1 & 1/2 tabs) Twice daily   Start Xarelto 15 mg daily (blood thinnner) please call and activate the card we gave you today, it should cost you any money, if it does and you can not afford medication please call us back  Labs today  Your physician has requested that you have an echocardiogram. Echocardiography is a painless test that uses sound waves to create images of your heart. It provides your doctor with information about the size and shape of your heart and how well your heart's chambers and valves are working. This procedure takes approximately one hour. There are no restrictions for this procedure.  We will contact you in 3 months to schedule your next appointment.

## 2013-09-27 NOTE — Telephone Encounter (Signed)
PA to [facility] for xarelto

## 2013-09-29 NOTE — Progress Notes (Signed)
Patient ID: Patricia Randall, female   DOB: December 02, 1928, 80 y.o.   MRN: [id number] PCP: Dr. Arelia Sneddon  80 yo with history of DM and HTN was admitted in October 2014  with atrial fibrillation/RVR and acute systolic CHF.  October 2014 EF 20-25% on echo with anterior/anteroseptal wall motion abnormalities. She was started on apixaban and had 04/02/13 TEE-guided DCCV.     ECHO 05/14/13 EF 25-30% Lateral wall moves well. All other hypokinetic  Cardiolite 12/14: Low risk stress test  Small apical defect. EF 42% Mild global hypokinesis  Since last appointment, she has been doing well.  She says that she could walk a mile without problems.  She can walk up a flight of steps.  No chest pain.  No tachypalpitations. She is not taking Eliquis because it is too expensive.  She is in NSR today.   Labs (10/14): K 4.2, creatinine 1.05, LDL 57, HDL 62 Labs (04/04/13) K 4.2 Creatinine 1.0  Labs (05/14/13) K 4.9 Creatinine 1.1  PMH: 1. Type II diabetes 2. HTN 3. Hyperlipidemia 4. Atrial fibrillation: First noted in 10/14 (admitted with CHF, afib RVR).  TEE-guided DCCV back to NSR.   5. Chronic systolic CHF: CHF exacerbation in 10/14 in setting of atrial fibrillation with RVR.  Troponin negative.  Echo (10/14) with EF 20-25% in setting of tachycardia, mid-apical anterior and anteroseptal akinesis, mild to moderate MR, mildly dilated RV with mildly decreased RV systolic function, moderate TR, PA systolic pressure 41 mmHg.  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  Echo (11/14) with EF 25-30%, all walls except lateral were hypokinetic.  Lexiscan Cardiolite (12/14): EF 42%, global hypokinesis, small fixed apical defect with no ischemia (low risk).   SH: Widow, lives with son in [place], nonsmoker.   FH: Diabetes.  ROS: All systems reviewed and negative except as per HPI.   Current Outpatient Prescriptions  Medication Sig Dispense Refill  . carvedilol (COREG) 6.25 MG tablet Take 1.5 tablets (9.375 mg total) by mouth 2  (two) times daily with a meal.  90 tablet  6  . furosemide (LASIX) 20 MG tablet Take 1 tablet (20 mg total) by mouth daily.  30 tablet  6  . glimepiride (AMARYL) 1 MG tablet Take 1 mg by mouth daily before breakfast.      . latanoprost (XALATAN) 0.005 % ophthalmic solution Place 1 drop into both eyes at bedtime.      Marland Kitchen lisinopril (PRINIVIL,ZESTRIL) 20 MG tablet TAKE 1 TABLET EVERY MORNING AND 1/2 TABLET EVERY EVENING  45 tablet  3  . lovastatin (MEVACOR) 20 MG tablet Take 40 mg by mouth at bedtime.      . metFORMIN (GLUCOPHAGE) 1000 MG tablet Take 1 tablet every morning and take 1/2 tablet at supper      . Rivaroxaban (XARELTO) 15 MG TABS tablet Take 1 tablet (15 mg total) by mouth daily with supper.  30 tablet  6   No current facility-administered medications for this encounter.    BP 124/66  Pulse 78  Wt 148 lb (67.132 kg)  SpO2 99% General: NAD Neck: No JVD, no thyromegaly or thyroid nodule.  Lungs: Clear to auscultation bilaterally with normal respiratory effort. CV: Nondisplaced PMI.  Heart regular S1/S2, no S3/S4, no murmur.  No peripheral edema.  No carotid bruit.  Normal pedal pulses.  Abdomen: Soft, nontender, no hepatosplenomegaly, no distention.  Skin: Intact without lesions or rashes.  Neurologic: Alert and oriented x 3.  Psych: Normal affect. Extremities: No clubbing or cyanosis.   Assessment/Plan: 1. Atrial  fibrillation: Maintaining SR after DC-CV in 10/14. Continue carvedilol 6.25 mg twice a day.  She needs to be anticoagulated.  Eliquis is going to be too expensive.  We are going to transition her to Xarelto 15 mg daily.  2. 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  NYHA I-II symptoms.  EF 25-30% on echo in 11/14, EF 42% on Cardiolite in 12/14.  She looks euvolemic.  No ischemia on Cardiolite.  - Continue lasix 20 mg daily  - Increase Coreg to 9.375 mg bid - Continue lisinopril to 20 mg in am and 10 mg in pm.  - BMET/BNP today.  - Repeat  echocardiogram to see if EF has recovered in NSR.  3. Hyperlipidemia: Will check lipids on lovastatin.   Larey Dresser 09/29/2013

## 2013-10-01 NOTE — Telephone Encounter (Signed)
[facility] approved xarelto 15 mg tablet through 09/27/2014, PA PO:[id number]

## 2013-10-04 ENCOUNTER — Ambulatory Visit (INDEPENDENT_AMBULATORY_CARE_PROVIDER_SITE_OTHER): Payer: Medicare Other | Admitting: Ophthalmology

## 2013-10-04 DIAGNOSIS — E1139 Type 2 diabetes mellitus with other diabetic ophthalmic complication: Secondary | ICD-10-CM

## 2013-10-04 DIAGNOSIS — H2510 Age-related nuclear cataract, unspecified eye: Secondary | ICD-10-CM

## 2013-10-04 DIAGNOSIS — E11319 Type 2 diabetes mellitus with unspecified diabetic retinopathy without macular edema: Secondary | ICD-10-CM

## 2013-10-04 DIAGNOSIS — H43819 Vitreous degeneration, unspecified eye: Secondary | ICD-10-CM

## 2013-10-04 DIAGNOSIS — I10 Essential (primary) hypertension: Secondary | ICD-10-CM

## 2013-10-04 DIAGNOSIS — H35039 Hypertensive retinopathy, unspecified eye: Secondary | ICD-10-CM

## 2013-10-04 DIAGNOSIS — E1165 Type 2 diabetes mellitus with hyperglycemia: Secondary | ICD-10-CM

## 2013-10-10 ENCOUNTER — Ambulatory Visit (HOSPITAL_COMMUNITY)
Admission: RE | Admit: 2013-10-10 | Discharge: 2013-10-10 | Disposition: A | Discharge status: Home / Self Care | Payer: Medicare Other | Source: Ambulatory Visit | Attending: Internal Medicine | Admitting: Internal Medicine

## 2013-10-10 DIAGNOSIS — I428 Other cardiomyopathies: Secondary | ICD-10-CM | POA: Insufficient documentation

## 2013-10-10 DIAGNOSIS — I5022 Chronic systolic (congestive) heart failure: Secondary | ICD-10-CM

## 2013-10-10 DIAGNOSIS — I517 Cardiomegaly: Secondary | ICD-10-CM | POA: Insufficient documentation

## 2013-10-10 DIAGNOSIS — I509 Heart failure, unspecified: Secondary | ICD-10-CM | POA: Insufficient documentation

## 2013-10-10 DIAGNOSIS — I4891 Unspecified atrial fibrillation: Secondary | ICD-10-CM | POA: Insufficient documentation

## 2013-10-10 NOTE — Progress Notes (Signed)
*  PRELIMINARY RESULTS* Echocardiogram 2D Echocardiogram has been performed.  Elvia Collum 10/10/2013, 10:33 AM

## 2013-12-21 ENCOUNTER — Other Ambulatory Visit (HOSPITAL_COMMUNITY): Payer: Self-pay | Admitting: Cardiology

## 2013-12-21 ENCOUNTER — Telehealth (HOSPITAL_COMMUNITY): Payer: Self-pay | Admitting: Anesthesiology

## 2013-12-21 NOTE — Telephone Encounter (Signed)
Called patient about ECHO. EF is much improved and is normal 60-65% with no abnormalities.   Junie Bame B NP-C 9:45 AM

## 2014-01-18 ENCOUNTER — Encounter (HOSPITAL_COMMUNITY): Payer: Self-pay | Admitting: Vascular Surgery

## 2014-03-01 ENCOUNTER — Other Ambulatory Visit (HOSPITAL_COMMUNITY): Payer: Self-pay | Admitting: Cardiology

## 2014-03-04 ENCOUNTER — Other Ambulatory Visit (HOSPITAL_COMMUNITY): Payer: Self-pay | Admitting: Cardiology

## 2014-03-06 ENCOUNTER — Other Ambulatory Visit (HOSPITAL_COMMUNITY): Payer: Self-pay | Admitting: Cardiology

## 2014-03-13 ENCOUNTER — Telehealth (HOSPITAL_COMMUNITY): Payer: Self-pay | Admitting: Cardiology

## 2014-03-13 ENCOUNTER — Ambulatory Visit (HOSPITAL_COMMUNITY)
Admission: RE | Admit: 2014-03-13 | Discharge: 2014-03-13 | Disposition: A | Discharge status: Home / Self Care | Payer: Medicare Other | Source: Ambulatory Visit | Attending: Internal Medicine | Admitting: Internal Medicine

## 2014-03-13 VITALS — BP 168/76 | HR 54 | Wt 138.5 lb

## 2014-03-13 DIAGNOSIS — I4891 Unspecified atrial fibrillation: Secondary | ICD-10-CM | POA: Insufficient documentation

## 2014-03-13 DIAGNOSIS — I509 Heart failure, unspecified: Secondary | ICD-10-CM | POA: Insufficient documentation

## 2014-03-13 DIAGNOSIS — Z79899 Other long term (current) drug therapy: Secondary | ICD-10-CM | POA: Insufficient documentation

## 2014-03-13 DIAGNOSIS — Z91199 Patient's noncompliance with other medical treatment and regimen due to unspecified reason: Secondary | ICD-10-CM | POA: Diagnosis not present

## 2014-03-13 DIAGNOSIS — I5022 Chronic systolic (congestive) heart failure: Secondary | ICD-10-CM | POA: Diagnosis not present

## 2014-03-13 DIAGNOSIS — Z9119 Patient's noncompliance with other medical treatment and regimen: Secondary | ICD-10-CM | POA: Diagnosis not present

## 2014-03-13 DIAGNOSIS — E785 Hyperlipidemia, unspecified: Secondary | ICD-10-CM | POA: Insufficient documentation

## 2014-03-13 DIAGNOSIS — I480 Paroxysmal atrial fibrillation: Secondary | ICD-10-CM

## 2014-03-13 DIAGNOSIS — E119 Type 2 diabetes mellitus without complications: Secondary | ICD-10-CM | POA: Diagnosis not present

## 2014-03-13 DIAGNOSIS — I10 Essential (primary) hypertension: Secondary | ICD-10-CM | POA: Diagnosis not present

## 2014-03-13 MED ORDER — AMLODIPINE BESYLATE 5 MG PO TABS: 5.0000 mg | ORAL_TABLET | Freq: Every day | ORAL | Status: DC

## 2014-03-13 NOTE — Telephone Encounter (Signed)
Message copied by Jalyiah Shelley, Sharlot Gowda on Wed Mar 13, 2014  3:26 PM ------      Message from: Glori Bickers R      Created: Wed Mar 13, 2014 12:58 PM       pls fax to Dr. Arelia Sneddon office ------

## 2014-03-13 NOTE — Patient Instructions (Addendum)
Follow up   Start coumadin per Dr Welton Flakes.   Take 5 mg amlodipine daily

## 2014-03-13 NOTE — Progress Notes (Signed)
Patient ID: Patricia Randall, female   DOB: Aug 27, 1928, 80 y.o.   MRN: [id number] PCP: Dr. Arelia Sneddon  80 yo with history of DM and HTN was admitted in October 2014  with atrial fibrillation/RVR and acute systolic CHF.  October 2014 EF 20-25% on echo with anterior/anteroseptal wall motion abnormalities. She was started on apixaban and had 04/02/13 TEE-guided DCCV.     She returns for follow up. Last visit carvedilol 9.375 mg twice a day. ECHO improved to 60-65%.Has not been taking Xarelto or Eliquis  due to cost. Denies SOB/PND/Orthopnea. Not weighing at home. Able to walk 1/2 mile about once a week.    ECHO 10/10/13 EF 60-65%  ECHO 05/14/13 EF 25-30% Lateral wall moves well. All other hypokinetic  Cardiolite 12/14: Low risk stress test  Small apical defect. EF 42% Mild global hypokinesis  Labs (10/14): K 4.2, creatinine 1.05, LDL 57, HDL 62 Labs (04/04/13) K 4.2 Creatinine 1.0  Labs (05/14/13) K 4.9 Creatinine 1.1 Labs (03/07/14) Creatinine 1.5  PMH: 1. Type II diabetes 2. HTN 3. Hyperlipidemia 4. Atrial fibrillation: CHADS = 4. (HTN, AGE, HF, DM) First noted in 10/14 (admitted with CHF, afib RVR).  TEE-guided DCCV back to NSR.   5. Chronic systolic CHF: CHF exacerbation in 10/14 in setting of atrial fibrillation with RVR.  Troponin negative.  Echo (10/14) with EF 20-25% in setting of tachycardia, mid-apical anterior and anteroseptal akinesis, mild to moderate MR, mildly dilated RV with mildly decreased RV systolic function, moderate TR, PA systolic pressure 41 mmHg.  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  Echo (11/14) with EF 25-30%, all walls except lateral were hypokinetic.  Lexiscan Cardiolite (12/14): EF 42%, global hypokinesis, small fixed apical defect with no ischemia (low risk).   SH: Widow, lives with son in [place], nonsmoker.   FH: Diabetes.  ROS: All systems reviewed and negative except as per HPI.   Current Outpatient Prescriptions  Medication Sig Dispense Refill  . carvedilol  (COREG) 6.25 MG tablet Take 1.5 tablets (9.375 mg total) by mouth 2 (two) times daily with a meal.  90 tablet  6  . furosemide (LASIX) 40 MG tablet Take 20 mg by mouth daily.      Marland Kitchen glimepiride (AMARYL) 1 MG tablet Take 1 mg by mouth daily before breakfast.      . latanoprost (XALATAN) 0.005 % ophthalmic solution Place 1 drop into both eyes at bedtime.      Marland Kitchen lisinopril (PRINIVIL,ZESTRIL) 20 MG tablet TAKE 1 TABLET EVERY MORNING AND 1/2 TABLET EVERY EVENING  45 tablet  1  . lovastatin (MEVACOR) 20 MG tablet Take 40 mg by mouth at bedtime.       No current facility-administered medications for this encounter.    BP 168/76  Pulse 54  Wt 138 lb 8 oz (62.823 kg)  SpO2 100% General: NAD Neck: No JVD, no thyromegaly or thyroid nodule.  Lungs: Clear to auscultation bilaterally with normal respiratory effort. CV: Nondisplaced PMI.  Heart regular S1/S2, no S3/S4, no murmur.  No peripheral edema.  No carotid bruit.  Normal pedal pulses.  Abdomen: Soft, nontender, no hepatosplenomegaly, no distention.  Skin: Intact without lesions or rashes.  Neurologic: Alert and oriented x 3.  Psych: Normal affect. Extremities: No clubbing or cyanosis.   Assessment/Plan: 1. Atrial fibrillation: Maintaining SR after DC-CV in 10/14. On carvedilol 9.375 mg twice a day. Not taking Xarelto due to cost. Chads Vasc Score 4 with 8.5 % risk of CVA. She needs to be on anticoagulant. Will start coumadin. Refer  to coumadin clinic 2. 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 No ischemia on Cardiolite.  09/2013 EF 60-65%. NYHA I.  She looks euvolemic.  - Continue lasix 20 mg daily  - Continue  Coreg to 9.375 mg bid - Continue lisinopril to 20 mg in am and 10 mg in pm.  Reviewed BMEt from Dr Welton Flakes 02/2014   3. Hyperlipidemia: Will check lipids on lovastatin.   4. HTN: Elevated. Will add amlodipine 5 daily. Continue current dose of coreg and lisinopril.  5. DMII: Per Dr Welton Flakes. Metoformin stopped per  Dr Welton Flakes. On glimepride  CLEGG,[name] NP-C  03/13/2014   Patient seen and examined with Darrick Grinder, NP. We discussed all aspects of the encounter. I agree with the assessment and plan as stated above. She is doing well. EF recovered. Maintaining NSR. However BP remains elevated. Also unable to afford NOAC. Will start amlodipine 5mg  daily for HTN. Discussed risk of stroke with CHADS = 4 (8.5% per year). Have advised starting warfarin. We contacted Dr. Arelia Sneddon and he will initiate. Will see back 4 months.  Daniel Bensimhon,MD 12:57 PM

## 2014-03-13 NOTE — Telephone Encounter (Signed)
As requested Fax/routing sent to [place] office

## 2014-05-06 ENCOUNTER — Other Ambulatory Visit (HOSPITAL_COMMUNITY): Payer: Self-pay | Admitting: Cardiology

## 2014-05-15 ENCOUNTER — Other Ambulatory Visit (HOSPITAL_COMMUNITY): Payer: Self-pay | Admitting: Cardiology

## 2014-05-30 ENCOUNTER — Emergency Department (HOSPITAL_COMMUNITY)
Admission: EM | Admit: 2014-05-30 | Discharge: 2014-05-30 | Disposition: A | Discharge status: Home / Self Care | Payer: Medicare Other | Attending: Emergency Medicine | Admitting: Emergency Medicine

## 2014-05-30 ENCOUNTER — Emergency Department (HOSPITAL_COMMUNITY): Payer: Medicare Other

## 2014-05-30 DIAGNOSIS — Y998 Other external cause status: Secondary | ICD-10-CM | POA: Insufficient documentation

## 2014-05-30 DIAGNOSIS — R52 Pain, unspecified: Secondary | ICD-10-CM

## 2014-05-30 DIAGNOSIS — S99812A Other specified injuries of left ankle, initial encounter: Secondary | ICD-10-CM | POA: Diagnosis not present

## 2014-05-30 DIAGNOSIS — X58XXXA Exposure to other specified factors, initial encounter: Secondary | ICD-10-CM | POA: Insufficient documentation

## 2014-05-30 DIAGNOSIS — I10 Essential (primary) hypertension: Secondary | ICD-10-CM | POA: Diagnosis not present

## 2014-05-30 DIAGNOSIS — E780 Pure hypercholesterolemia: Secondary | ICD-10-CM | POA: Insufficient documentation

## 2014-05-30 DIAGNOSIS — E119 Type 2 diabetes mellitus without complications: Secondary | ICD-10-CM | POA: Insufficient documentation

## 2014-05-30 DIAGNOSIS — I5022 Chronic systolic (congestive) heart failure: Secondary | ICD-10-CM | POA: Insufficient documentation

## 2014-05-30 DIAGNOSIS — Z79899 Other long term (current) drug therapy: Secondary | ICD-10-CM | POA: Diagnosis not present

## 2014-05-30 DIAGNOSIS — Y929 Unspecified place or not applicable: Secondary | ICD-10-CM | POA: Diagnosis not present

## 2014-05-30 DIAGNOSIS — Y9389 Activity, other specified: Secondary | ICD-10-CM | POA: Insufficient documentation

## 2014-05-30 DIAGNOSIS — M79672 Pain in left foot: Secondary | ICD-10-CM

## 2014-05-30 DIAGNOSIS — Z8669 Personal history of other diseases of the nervous system and sense organs: Secondary | ICD-10-CM | POA: Diagnosis not present

## 2014-05-30 LAB — CBG MONITORING, ED: GLUCOSE-CAPILLARY: 166 mg/dL — AB (ref 70–99)

## 2014-05-30 MED ORDER — TRAMADOL HCL 50 MG PO TABS: 50.0000 mg | ORAL_TABLET | Freq: Four times a day (QID) | ORAL | Status: DC | PRN

## 2014-05-30 NOTE — ED Notes (Signed)
Twisted left ankle last night while carrying wood in to the house. Unable to bear weight on ankle. Swollen on top of foot and malleolus . No other injuries.

## 2014-05-30 NOTE — Discharge Instructions (Signed)
Call for a follow up appointment with a Family or Primary Care Provider. If symptoms persists call an orthopedic specialist for further evaluation of your foot pain, and possible repeated X-ray.  Return if Symptoms worsen.   Take medication as prescribed.

## 2014-05-30 NOTE — ED Provider Notes (Signed)
Medical screening examination/treatment/procedure(s) were conducted as a shared visit with non-physician practitioner(s) and myself.  I personally evaluated the patient during the encounter.   EKG Interpretation None      Results for orders placed or performed during the hospital encounter of [id number]  Basic metabolic panel  Result Value Ref Range   Sodium 142 137 - 147 mEq/L   Potassium 4.2 3.7 - 5.3 mEq/L   Chloride 102 96 - 112 mEq/L   CO2 23 19 - 32 mEq/L   Glucose, Bld 64 (L) 70 - 99 mg/dL   BUN 33 (H) 6 - 23 mg/dL   Creatinine, Ser 1.30 (H) 0.50 - 1.10 mg/dL   Calcium 9.7 8.4 - 10.5 mg/dL   GFR calc non Af Amer 37 (L) >90 mL/min   GFR calc Af Amer 43 (L) >90 mL/min  Pro b natriuretic peptide  Result Value Ref Range   Pro B Natriuretic peptide (BNP) 24.2 0 - 450 pg/mL  Lipid panel  Result Value Ref Range   Cholesterol 120 0 - 200 mg/dL   Triglycerides 79 <150 mg/dL   HDL 60 >39 mg/dL   Total CHOL/HDL Ratio 2.0 RATIO   VLDL 16 0 - 40 mg/dL   LDL Cholesterol 44 0 - 99 mg/dL   Dg Foot Complete Left  05/30/2014   CLINICAL DATA:  Left foot pain. Symptoms x1 day. No known injury. Initial evaluation.  EXAM: LEFT FOOT - COMPLETE 3+ VIEW  COMPARISON:  None.  FINDINGS: Vascular calcification consistent with atherosclerotic vascular disease. Diffuse degenerative change and osteopenia. No acute abnormality identified. No evidence of fracture or dislocation.  IMPRESSION: Diffuse osteopenia and degenerative change.  No acute abnormality.   Electronically Signed   By: Marcello Moores  Register   On: 05/30/2014 11:41    Patient seen by me. Patient without any direct injury to her ankle. Patient denies any twisting. Was carrying wood into the house. Develop pain and swelling on the top of her left foot. No swelling or tenderness to the malleolus. No proximal fibular tenderness. Cap refill to the toes is 1 second. Sensation is intact. X-ray of the foot is negative for any bony injury.  Most likely  an inflammatory type pain response based on the the redness and increased warmth over the top of the forefoot. However stress fracture not completely ruled out. If symptoms persist repeat x-ray in 2 weeks would be important to identify stress fracture. Patient will be treated with tramadol.  Fredia Sorrow, MD 05/30/14 1200

## 2014-05-30 NOTE — ED Provider Notes (Signed)
CSN: [id number]     Arrival date & time 05/30/14  1003 History   First MD Initiated Contact with Patient 05/30/14 1014     Chief Complaint  Patient presents with  . Ankle Injury     (Consider location/radiation/quality/duration/timing/severity/associated sxs/prior Treatment) HPI Comments: The patient is a 80 year old female past mental history of hypertension, claudication, type 2 diabetes presenting to emergency room chief complaint of left foot pain since last night. Patient reports wearing snug shoes last night, after she noticed pain to the dorsum worsened with pressure.  Patient denies injury, fall, dropping heavy object on foot. Denies history of gout.  Patient is a 80 y.o. female presenting with lower extremity injury. The history is provided by the patient. No language interpreter was used.  Ankle Injury Associated symptoms include arthralgias and joint swelling.    Past Medical History  Diagnosis Date  . Hypertension   . High cholesterol   . Claudication   . Glaucoma   . Type II diabetes mellitus   . Chronic systolic CHF (congestive heart failure)     a. 03/2013 Echo: EF 20-25%, diff HK, mild to mod MR, sev dil LA, mild RV dysfxn, sev dil RA, mod TR.  Marland Kitchen Atrial fibrillation     a. 03/2013 s/p TEE/DCCV;  b. 03/2013 Eliquis initiated.   Past Surgical History  Procedure Laterality Date  . Eye surgery Right     "had blood in it" (03/30/2013)  . Tee without cardioversion N/A 04/02/2013    Procedure: TRANSESOPHAGEAL ECHOCARDIOGRAM (TEE);  Surgeon: Thayer Headings, MD;  Location: [facility];  Service: Cardiovascular;  Laterality: N/A;  . Cardioversion N/A 04/02/2013    Procedure: CARDIOVERSION;  Surgeon: Thayer Headings, MD;  Location: [facility] ENDOSCOPY;  Service: Cardiovascular;  Laterality: N/A;  Spoke with Tom    Family History  Problem Relation Age of Onset  . Other Mother     died in her 28's - 'just got sick.'  . Other Father     died @ 24, unknown cause.  . Diabetes  Brother   . Other      12 siblings + 7 more 1/2 siblings.   History  Substance Use Topics  . Smoking status: Never Smoker   . Smokeless tobacco: Never Used  . Alcohol Use: No   OB History    No data available     Review of Systems  Musculoskeletal: Positive for joint swelling, arthralgias and gait problem.  Skin: Negative for color change.      Allergies  Review of patient's allergies indicates no known allergies.  Home Medications   Prior to Admission medications   Medication Sig Start Date End Date Taking? Authorizing Provider  amLODipine (NORVASC) 5 MG tablet Take 1 tablet (5 mg total) by mouth daily. 03/13/14   Jolaine Artist, MD  carvedilol (COREG) 6.25 MG tablet TAKE 1 AND 1/2 TABLETS BY MOUTH TWICE A DAY WITH A MEAL 05/17/14   Larey Dresser, MD  furosemide (LASIX) 40 MG tablet Take 20 mg by mouth daily.    Historical Provider, MD  glimepiride (AMARYL) 1 MG tablet Take 1 mg by mouth daily before breakfast.    Historical Provider, MD  latanoprost (XALATAN) 0.005 % ophthalmic solution Place 1 drop into both eyes at bedtime.    Historical Provider, MD  lisinopril (PRINIVIL,ZESTRIL) 20 MG tablet TAKE 1 TABLET BY MOUTH EVERY MORNING AND 1/2 TABLET EVERY EVENING 05/06/14   Jolaine Artist, MD  lovastatin (MEVACOR) 20 MG tablet  Take 40 mg by mouth at bedtime.    Historical Provider, MD   BP 152/73 mmHg  Pulse 54  Temp(Src) 97.6 F (36.4 C) (Oral)  Resp 16  Wt 150 lb (68.04 kg)  SpO2 100% Physical Exam  Constitutional: She is oriented to person, place, and time. She appears well-developed and well-nourished. No distress.  HENT:  Head: Normocephalic and atraumatic.  Neck: Neck supple.  Pulmonary/Chest: Effort normal. No respiratory distress.  Musculoskeletal:       Left ankle: She exhibits swelling. Tenderness.       Feet:  Left dorsum of foot, tender to palpation, minimal swelling, no obvious skin lesion, no ecchymosis, no erythema.   Neurological: She is  alert and oriented to person, place, and time.  Skin: Skin is warm and dry. She is not diaphoretic.  Psychiatric: She has a normal mood and affect. Her behavior is normal.  Nursing note and vitals reviewed.   ED Course  Procedures (including critical care time) Labs Review Labs Reviewed - No data to display  Imaging Review Dg Foot Complete Left  05/30/2014   CLINICAL DATA:  Left foot pain. Symptoms x1 day. No known injury. Initial evaluation.  EXAM: LEFT FOOT - COMPLETE 3+ VIEW  COMPARISON:  None.  FINDINGS: Vascular calcification consistent with atherosclerotic vascular disease. Diffuse degenerative change and osteopenia. No acute abnormality identified. No evidence of fracture or dislocation.  IMPRESSION: Diffuse osteopenia and degenerative change.  No acute abnormality.   Electronically Signed   By: Marcello Moores  Register   On: 05/30/2014 11:41     EKG Interpretation None      MDM   Final diagnoses:  Pain  Left foot pain   Patient presents with nontraumatic pain to left foot. X-ray negative for acute findings. No obvious sign of infection. Plan to discharge home with walker, pain medication, orthopedics follow-up as needed. Dr. Rogene Houston also evaluated the patient in this encounter Reevaluation patient sleeping in room discussed imaging results, and treatment plan with the patient. Return precautions given. Reports understanding and no other concerns at this time.  Patient is stable for discharge at this time.  Meds given in ED:  Medications - No data to display  Discharge Medication List as of 05/30/2014 12:36 PM    START taking these medications   Details  traMADol (ULTRAM) 50 MG tablet Take 1 tablet (50 mg total) by mouth every 6 (six) hours as needed., Starting 05/30/2014, Until Discontinued, Print           Harvie Heck, PA-C 05/30/14 1537

## 2014-09-30 ENCOUNTER — Other Ambulatory Visit (HOSPITAL_COMMUNITY): Payer: Self-pay | Admitting: Internal Medicine

## 2014-10-14 ENCOUNTER — Ambulatory Visit (INDEPENDENT_AMBULATORY_CARE_PROVIDER_SITE_OTHER): Payer: Medicare Other | Admitting: Ophthalmology

## 2014-10-14 DIAGNOSIS — E11319 Type 2 diabetes mellitus with unspecified diabetic retinopathy without macular edema: Secondary | ICD-10-CM

## 2014-10-14 DIAGNOSIS — I10 Essential (primary) hypertension: Secondary | ICD-10-CM

## 2014-10-14 DIAGNOSIS — H35033 Hypertensive retinopathy, bilateral: Secondary | ICD-10-CM

## 2014-10-14 DIAGNOSIS — H43813 Vitreous degeneration, bilateral: Secondary | ICD-10-CM

## 2014-10-14 DIAGNOSIS — H2513 Age-related nuclear cataract, bilateral: Secondary | ICD-10-CM | POA: Diagnosis not present

## 2014-10-14 DIAGNOSIS — E11329 Type 2 diabetes mellitus with mild nonproliferative diabetic retinopathy without macular edema: Secondary | ICD-10-CM

## 2014-11-01 ENCOUNTER — Other Ambulatory Visit (HOSPITAL_COMMUNITY): Payer: Self-pay | Admitting: Internal Medicine

## 2014-11-14 ENCOUNTER — Other Ambulatory Visit (HOSPITAL_COMMUNITY): Payer: Self-pay | Admitting: Internal Medicine

## 2014-12-19 ENCOUNTER — Other Ambulatory Visit (HOSPITAL_COMMUNITY): Payer: Self-pay | Admitting: Cardiology

## 2014-12-27 ENCOUNTER — Other Ambulatory Visit (HOSPITAL_COMMUNITY): Payer: Self-pay | Admitting: Internal Medicine

## 2015-03-30 ENCOUNTER — Other Ambulatory Visit (HOSPITAL_COMMUNITY): Payer: Self-pay | Admitting: Internal Medicine

## 2015-07-03 ENCOUNTER — Encounter (HOSPITAL_COMMUNITY): Payer: Self-pay | Admitting: Emergency Medicine

## 2015-07-03 ENCOUNTER — Inpatient Hospital Stay (HOSPITAL_COMMUNITY)
Admission: EM | Admit: 2015-07-03 | Discharge: 2015-07-05 | DRG: 683 | Disposition: A | Discharge status: Home / Self Care | Payer: Medicare Other | Attending: Family Medicine | Admitting: Family Medicine

## 2015-07-03 DIAGNOSIS — Z7901 Long term (current) use of anticoagulants: Secondary | ICD-10-CM

## 2015-07-03 DIAGNOSIS — I130 Hypertensive heart and chronic kidney disease with heart failure and stage 1 through stage 4 chronic kidney disease, or unspecified chronic kidney disease: Secondary | ICD-10-CM | POA: Diagnosis not present

## 2015-07-03 DIAGNOSIS — I5042 Chronic combined systolic (congestive) and diastolic (congestive) heart failure: Secondary | ICD-10-CM | POA: Diagnosis not present

## 2015-07-03 DIAGNOSIS — N183 Chronic kidney disease, stage 3 unspecified: Secondary | ICD-10-CM | POA: Diagnosis present

## 2015-07-03 DIAGNOSIS — I739 Peripheral vascular disease, unspecified: Secondary | ICD-10-CM | POA: Diagnosis present

## 2015-07-03 DIAGNOSIS — E1165 Type 2 diabetes mellitus with hyperglycemia: Secondary | ICD-10-CM | POA: Diagnosis present

## 2015-07-03 DIAGNOSIS — E43 Unspecified severe protein-calorie malnutrition: Secondary | ICD-10-CM | POA: Diagnosis present

## 2015-07-03 DIAGNOSIS — D631 Anemia in chronic kidney disease: Secondary | ICD-10-CM | POA: Diagnosis present

## 2015-07-03 DIAGNOSIS — Z7984 Long term (current) use of oral hypoglycemic drugs: Secondary | ICD-10-CM | POA: Diagnosis not present

## 2015-07-03 DIAGNOSIS — I480 Paroxysmal atrial fibrillation: Secondary | ICD-10-CM | POA: Diagnosis present

## 2015-07-03 DIAGNOSIS — E1122 Type 2 diabetes mellitus with diabetic chronic kidney disease: Secondary | ICD-10-CM | POA: Diagnosis present

## 2015-07-03 DIAGNOSIS — D638 Anemia in other chronic diseases classified elsewhere: Secondary | ICD-10-CM | POA: Diagnosis present

## 2015-07-03 DIAGNOSIS — Z2239 Carrier of other specified bacterial diseases: Secondary | ICD-10-CM | POA: Diagnosis not present

## 2015-07-03 DIAGNOSIS — I959 Hypotension, unspecified: Secondary | ICD-10-CM

## 2015-07-03 DIAGNOSIS — N179 Acute kidney failure, unspecified: Secondary | ICD-10-CM | POA: Diagnosis not present

## 2015-07-03 DIAGNOSIS — E785 Hyperlipidemia, unspecified: Secondary | ICD-10-CM | POA: Diagnosis present

## 2015-07-03 DIAGNOSIS — R112 Nausea with vomiting, unspecified: Secondary | ICD-10-CM | POA: Diagnosis present

## 2015-07-03 DIAGNOSIS — R4702 Dysphasia: Secondary | ICD-10-CM | POA: Diagnosis present

## 2015-07-03 DIAGNOSIS — R1310 Dysphagia, unspecified: Secondary | ICD-10-CM

## 2015-07-03 DIAGNOSIS — N189 Chronic kidney disease, unspecified: Secondary | ICD-10-CM | POA: Diagnosis present

## 2015-07-03 LAB — URINALYSIS, ROUTINE W REFLEX MICROSCOPIC
Glucose, UA: NEGATIVE mg/dL
KETONES UR: 15 mg/dL — AB
Nitrite: NEGATIVE
PROTEIN: 100 mg/dL — AB
Specific Gravity, Urine: 1.016 (ref 1.005–1.030)
pH: 5 (ref 5.0–8.0)

## 2015-07-03 LAB — COMPREHENSIVE METABOLIC PANEL
ALK PHOS: 54 U/L (ref 38–126)
ALT: 18 U/L (ref 14–54)
AST: 24 U/L (ref 15–41)
Albumin: 2.9 g/dL — ABNORMAL LOW (ref 3.5–5.0)
Anion gap: 15 (ref 5–15)
BUN: 57 mg/dL — ABNORMAL HIGH (ref 6–20)
CALCIUM: 9 mg/dL (ref 8.9–10.3)
CO2: 24 mmol/L (ref 22–32)
CREATININE: 4.44 mg/dL — AB (ref 0.44–1.00)
Chloride: 101 mmol/L (ref 101–111)
GFR calc non Af Amer: 8 mL/min — ABNORMAL LOW (ref >60)
GFR, EST AFRICAN AMERICAN: 10 mL/min — AB (ref >60)
Glucose, Bld: 283 mg/dL — ABNORMAL HIGH (ref 65–99)
Potassium: 4 mmol/L (ref 3.5–5.1)
Sodium: 140 mmol/L (ref 135–145)
Total Bilirubin: 0.8 mg/dL (ref 0.3–1.2)
Total Protein: 6.8 g/dL (ref 6.5–8.1)

## 2015-07-03 LAB — CBC
HCT: 34.4 % — ABNORMAL LOW (ref 36.0–46.0)
Hemoglobin: 11.1 g/dL — ABNORMAL LOW (ref 12.0–15.0)
MCH: 27.3 pg (ref 26.0–34.0)
MCHC: 32.3 g/dL (ref 30.0–36.0)
MCV: 84.7 fL (ref 78.0–100.0)
PLATELETS: 286 10*3/uL (ref 150–400)
RBC: 4.06 MIL/uL (ref 3.87–5.11)
RDW: 14.5 % (ref 11.5–15.5)
WBC: 6 10*3/uL (ref 4.0–10.5)

## 2015-07-03 LAB — URINE MICROSCOPIC-ADD ON

## 2015-07-03 LAB — LIPASE, BLOOD: LIPASE: 37 U/L (ref 11–51)

## 2015-07-03 LAB — GLUCOSE, CAPILLARY: GLUCOSE-CAPILLARY: 147 mg/dL — AB (ref 65–99)

## 2015-07-03 LAB — CBG MONITORING, ED: Glucose-Capillary: 247 mg/dL — ABNORMAL HIGH (ref 65–99)

## 2015-07-03 LAB — PROTIME-INR
INR: 3.93 — ABNORMAL HIGH (ref 0.00–1.49)
PROTHROMBIN TIME: 37.5 s — AB (ref 11.6–15.2)

## 2015-07-03 MED ORDER — BOOST / RESOURCE BREEZE PO LIQD: 1.0000 | Freq: Three times a day (TID) | ORAL | Status: DC

## 2015-07-03 MED ORDER — WARFARIN - PHARMACIST DOSING INPATIENT: Freq: Every day | Status: DC

## 2015-07-03 MED ORDER — INSULIN ASPART 100 UNIT/ML ~~LOC~~ SOLN
0.0000 [IU] | Freq: Three times a day (TID) | SUBCUTANEOUS | Status: DC
  Administered 2015-07-04: 2 [IU] via SUBCUTANEOUS
  Administered 2015-07-05 (×2): 1 [IU] via SUBCUTANEOUS

## 2015-07-03 MED ORDER — ONDANSETRON HCL 4 MG/2ML IJ SOLN
4.0000 mg | Freq: Once | INTRAMUSCULAR | Status: AC
  Administered 2015-07-03: 4 mg via INTRAVENOUS
  Filled 2015-07-03: qty 2

## 2015-07-03 MED ORDER — INSULIN ASPART 100 UNIT/ML ~~LOC~~ SOLN: 0.0000 [IU] | Freq: Every day | SUBCUTANEOUS | Status: DC

## 2015-07-03 MED ORDER — ONDANSETRON 4 MG PO TBDP: 8.0000 mg | ORAL_TABLET | Freq: Three times a day (TID) | ORAL | Status: DC | PRN

## 2015-07-03 MED ORDER — SODIUM CHLORIDE 0.9 % IV SOLN
INTRAVENOUS | Status: DC
Start: 2015-07-03 — End: 2015-07-05
  Administered 2015-07-03 – 2015-07-05 (×4): via INTRAVENOUS

## 2015-07-03 MED ORDER — SODIUM CHLORIDE 0.9 % IV BOLUS (SEPSIS)
1000.0000 mL | Freq: Once | INTRAVENOUS | Status: AC
  Administered 2015-07-03: 1000 mL via INTRAVENOUS

## 2015-07-03 MED ORDER — CARVEDILOL 6.25 MG PO TABS
6.2500 mg | ORAL_TABLET | Freq: Two times a day (BID) | ORAL | Status: DC
Start: 2015-07-04 — End: 2015-07-05
  Administered 2015-07-04 – 2015-07-05 (×3): 6.25 mg via ORAL
  Filled 2015-07-03 (×3): qty 1

## 2015-07-03 MED ORDER — SODIUM CHLORIDE 0.9 % IV SOLN
INTRAVENOUS | Status: DC
  Administered 2015-07-04: 02:00:00 via INTRAVENOUS

## 2015-07-03 MED ORDER — LATANOPROST 0.005 % OP SOLN
1.0000 [drp] | Freq: Every day | OPHTHALMIC | Status: DC
  Administered 2015-07-03 – 2015-07-04 (×2): 1 [drp] via OPHTHALMIC
  Filled 2015-07-03 (×2): qty 2.5

## 2015-07-03 NOTE — Progress Notes (Signed)
ANTICOAGULATION CONSULT NOTE - Initial Consult   Pharmacy Consult for warfarin Indication: atrial fibrillation  No Known Allergies  Patient Measurements: Total Body Weight: 68kg  Vital Signs: Temp: 98 F (36.7 C) (01/12 1712) Temp Source: Oral (01/12 1712) BP: 100/75 mmHg (01/12 1845) Pulse Rate: 61 (01/12 1845)  Labs:  Recent Labs  07/03/15 1425  HGB 11.1*  HCT 34.4*  PLT 286  LABPROT 37.5*  INR 3.93*  CREATININE 4.44*    CrCl cannot be calculated (Unknown ideal weight.).   Medical History: Past Medical History  Diagnosis Date  . Hypertension   . High cholesterol   . Claudication ([place])   . Glaucoma   . Type II diabetes mellitus ([place])   . Chronic systolic CHF (congestive heart failure) ([place])     a. 03/2013 Echo: EF 20-25%, diff HK, mild to mod MR, sev dil LA, mild RV dysfxn, sev dil RA, mod TR.  Marland Kitchen Atrial fibrillation ([place])     a. 03/2013 s/p TEE/DCCV;  b. 03/2013 Eliquis initiated.    Medications:   (Not in a hospital admission) Scheduled:   Infusions:  . sodium chloride 125 mL/hr at 07/03/15 1838    Assessment: 82yo F w/ hx of Afib and N/V. Presents w/ sickness over last 5 days, everytime she eats gets sick and vomits, fatigue due to unable to eat or drink. Pharmacy consulted to dose warfarin for Afib. sCr 4.44, CrCl ~13, Hgb 11.1, Plts 286, No s/sx of bleeding INR 3.93 (supratherapeutic)  PTA: 4mg  SMTWFS, 2mg  Th (last dose "in past month")  Goal of Therapy:  INR 2-3 Monitor platelets by anticoagulation protocol: Yes   Plan:  Hold dose of warfarin today Follow up daily INR, H&H, monitor s/sx of bleeding,   Georgiann Mohs, PharmD Student

## 2015-07-03 NOTE — ED Provider Notes (Signed)
CSN: [id number]     Arrival date & time 07/03/15  1328 History   First MD Initiated Contact with Patient 07/03/15 1823     Chief Complaint  Patient presents with  . Emesis  . Fatigue     (Consider location/radiation/quality/duration/timing/severity/associated sxs/prior Treatment) The history is provided by the patient.     Patricia Randall is a 82 y.o. female who presents for evaluation of nausea and vomiting. Vomiting occurs immediately after eating. Symptoms present for 5 days. She had a small bowel movement, today. Denies fever, chills, back pain, abdominal pain, weakness or dizziness. No prior similar problem. She saw her PCP, last week, for the problem, was treated with Zofran, without relief.   Past Medical History  Diagnosis Date  . Hypertension   . High cholesterol   . Claudication ([place])   . Glaucoma   . Type II diabetes mellitus ([place])   . Chronic systolic CHF (congestive heart failure) ([place])     a. 03/2013 Echo: EF 20-25%, diff HK, mild to mod MR, sev dil LA, mild RV dysfxn, sev dil RA, mod TR.  Marland Kitchen Atrial fibrillation ([place])     a. 03/2013 s/p TEE/DCCV;  b. 03/2013 Eliquis initiated.   Past Surgical History  Procedure Laterality Date  . Eye surgery Right     "had blood in it" (03/30/2013)  . Tee without cardioversion N/A 04/02/2013    Procedure: TRANSESOPHAGEAL ECHOCARDIOGRAM (TEE);  Surgeon: Thayer Headings, MD;  Location: [facility];  Service: Cardiovascular;  Laterality: N/A;  . Cardioversion N/A 04/02/2013    Procedure: CARDIOVERSION;  Surgeon: Thayer Headings, MD;  Location: [facility] ENDOSCOPY;  Service: Cardiovascular;  Laterality: N/A;  Spoke with Tom    Family History  Problem Relation Age of Onset  . Other Mother     died in her 58's - 'just got sick.'  . Other Father     died @ 29, unknown cause.  . Diabetes Brother   . Other      12 siblings + 7 more 1/2 siblings.   Social History  Substance Use Topics  . Smoking status: Never Smoker   . Smokeless tobacco:  Never Used  . Alcohol Use: No   OB History    No data available     Review of Systems  All other systems reviewed and are negative.     Allergies  Review of patient's allergies indicates no known allergies.  Home Medications   Prior to Admission medications   Medication Sig Start Date End Date Taking? Authorizing Provider  amLODipine (NORVASC) 5 MG tablet TAKE 1 TABLET BY MOUTH EVERY DAY 04/01/15   Jolaine Artist, MD  carvedilol (COREG) 6.25 MG tablet TAKE 1 AND 1/2 TABLETS BY MOUTH TWICE A DAY WITH A MEAL 12/19/14   Larey Dresser, MD  furosemide (LASIX) 20 MG tablet Take 20 mg by mouth.    Historical Provider, MD  furosemide (LASIX) 40 MG tablet Take 20 mg by mouth daily.    Historical Provider, MD  glimepiride (AMARYL) 1 MG tablet Take 1 mg by mouth daily before breakfast.    Historical Provider, MD  latanoprost (XALATAN) 0.005 % ophthalmic solution Place 1 drop into both eyes at bedtime.    Historical Provider, MD  lisinopril (PRINIVIL,ZESTRIL) 20 MG tablet TAKE 1 TABLET BY MOUTH EVERY MORNING AND 1/2 TABLET EVERY EVENING 11/01/14   Jolaine Artist, MD  lovastatin (MEVACOR) 20 MG tablet Take 40 mg by mouth at bedtime.    Historical  Provider, MD  lovastatin (MEVACOR) 40 MG tablet Take 40 mg by mouth at bedtime.    Historical Provider, MD  traMADol (ULTRAM) 50 MG tablet Take 1 tablet (50 mg total) by mouth every 6 (six) hours as needed. 05/30/14   Harvie Heck, PA-C  warfarin (COUMADIN) 2 MG tablet Take 2-4 mg by mouth daily. Take 1 to 2 tablets every other dayalternating from 1 tablet, then 2 tablets.....    Historical Provider, MD   BP 99/53 mmHg  Pulse 80  Temp(Src) 98 F (36.7 C) (Oral)  Resp 18  SpO2 98% Physical Exam  Constitutional: She is oriented to person, place, and time. She appears well-developed and well-nourished.  HENT:  Head: Normocephalic and atraumatic.  Right Ear: External ear normal.  Left Ear: External ear normal.  Eyes: Conjunctivae and EOM  are normal. Pupils are equal, round, and reactive to light.  Neck: Normal range of motion and phonation normal. Neck supple.  Cardiovascular: Normal rate, regular rhythm and normal heart sounds.   Pulmonary/Chest: Effort normal and breath sounds normal. She exhibits no bony tenderness.  Abdominal: Soft. She exhibits no distension. There is tenderness (epigastric, mild). There is no rebound and no guarding.  Musculoskeletal: Normal range of motion. She exhibits no edema or tenderness.  Neurological: She is alert and oriented to person, place, and time. No cranial nerve deficit or sensory deficit. She exhibits normal muscle tone. Coordination normal.  Skin: Skin is warm, dry and intact.  Psychiatric: She has a normal mood and affect. Her behavior is normal. Judgment and thought content normal.  Nursing note and vitals reviewed.   ED Course  Procedures (including critical care time)  Medications  0.9 %  sodium chloride infusion ( Intravenous New Bag/Given 07/03/15 1838)  sodium chloride 0.9 % bolus 1,000 mL (1,000 mLs Intravenous New Bag/Given 07/03/15 1838)    Patient Vitals for the past 24 hrs:  BP Temp Temp src Pulse Resp SpO2  07/03/15 1830 97/56 mmHg - - 69 20 100 %  07/03/15 1712 (!) 99/53 mmHg 98 F (36.7 C) Oral 80 18 98 %  07/03/15 1500 104/64 mmHg - - 80 18 98 %  07/03/15 1417 98/65 mmHg - - - - -  07/03/15 1412 (!) 92/49 mmHg 98.5 F (36.9 C) Oral 75 18 98 %   6:50 PM-Consult complete with admitting physician. Patient case explained and discussed. He agrees to admit patient for further evaluation and treatment. Call ended at [place] - Abnormal; Notable for the following:    Glucose, Bld 283 (*)    BUN 57 (*)    Creatinine, Ser 4.44 (*)    Albumin 2.9 (*)    GFR calc non Af Amer 8 (*)    GFR calc Af Amer 10 (*)    All other components within normal limits  CBC - Abnormal; Notable for the following:    Hemoglobin  11.1 (*)    HCT 34.4 (*)    All other components within normal limits  PROTIME-INR - Abnormal; Notable for the following:    Prothrombin Time 37.5 (*)    INR 3.93 (*)    All other components within normal limits  CBG MONITORING, ED - Abnormal; Notable for the following:    Glucose-Capillary 247 (*)    All other components within normal limits  LIPASE, BLOOD  URINALYSIS, ROUTINE W REFLEX MICROSCOPIC (NOT AT [facility])    Imaging Review No results found. I have  personally reviewed and evaluated these images and lab results as part of my medical decision-making.   EKG Interpretation None         MDM   Final diagnoses:  Nausea and vomiting, vomiting of unspecified type  AKI (acute kidney injury) (HCC)  Hypotension, unspecified hypotension type    Nonspecific, nausea and vomiting with marked AKI. Patient also hypotensive. Suspect peptic ulcer disease, as source for vomiting. She is alert, lucid, and nontoxic. No indicators for requiring vasopressors at this time.  Nursing Notes Reviewed/ Care Coordinated, and agree without changes. Applicable Imaging Reviewed.  Interpretation of Laboratory Data incorporated into ED treatment  Plan: Admit    Daleen Bo, MD 07/04/15 1253

## 2015-07-03 NOTE — H&P (Signed)
[facility] Admission History and Physical Service Pager: [phone]  Patient name: Patricia Randall Medical record number: [id number] Date of birth: December 19, 1928 Age: 82 y.o. Gender: female  Primary Care Provider: Leonard Downing, MD Consultants: None  Code Status: Full  Chief Complaint: emesis   Assessment and Plan: Patricia Randall is a 82 y.o. female presenting with nausea vomiting . PMH is significant for for paroxysmal atrial fibrillation, hypertension, hyperlipidemia, type 2 diabetes, history of systolic heart failure now with normal ejection fraction and chronic kidney disease.  Acute on CKD: most likely secondary to her nausea and vomiting. Scr 4.44 with a baseline of 1.1. She reports still having normal voids. Urinalysis showing large leukocytes and moderate hemoglobin. Denies any dysuria and no fevers or chills. Doesn't appear to be the result of an infection and not trauma as a collection was not the result of in and out cath. - Admit to med-surg, Dr. Mingo Amber attending - Urine culture  - calculate FeNa  - Normal saline at 125 mL per hour - If creatinine fails consider Renal US  Nausea/vomiting: Denies any prior abdominal history. She doesn't report any drooling, cough, or difficulty initiating a swallow. Reports that is more like food getting stuck. Concern for structure abnormality versus motility disorder versus inflammatory process. - Consult GI for EGD plus or minus barium swallow. -Nothing by mouth at midnight  Chronic systolic CHF: history of systolic but 123456 EF on most recent ECHO (April 2015)  -  Continue Coreg - Holding Lasix  DM2: Hyperglycemic upon admission despite reporting limited by mouth intake - Clears until midnight. - Sensitive and daily at bedtime correction  HTN: presenting with lower blood pressures. Will hold home medications for know.  - hold amlodipine , lisinopril  Paroxysmal AFib: Chronically anticoagulated with  warfarin. CHADS=4. INR is supratheraputic upon admission - continue coumadin per pharmacy  HLD: stable  - Holding statin  FEN/GI: Carb modified/NS @100  mL/hr  Prophylaxis: Coumadin  Disposition: admitted to med surg for AKI   History of Present Illness:  Patricia Randall is a 82 y.o. female presenting with nausea and vomiting. Her symptoms started 4-5 days ago. With every time that she ate she would have an episode of emesis. This episode came right after eating or drinking. She has been unable to take her medications regularly during this time. Her last bowel movement was yesterday morning and her last void was this morning. She denies any fever, chills, chest pain, shortness of breath, abdominal pain, dysuria, or rashes. She has never had this problem before. She denies any travel, sick contacts, or any strange foods. She feels like the food gets stuck in her throat when she swallows it and he comes back up. She has no history of abdominal surgeries. She was feeling weak and so that is the reason they brought her into the emergency department today.  Review Of Systems: Per HPI with the following additions: See HPI  Otherwise the remainder of the systems were negative.  Patient Active Problem List   Diagnosis Date Noted  . AKI (acute kidney injury) ([place]) 07/03/2015  . Essential hypertension, benign 03/13/2014  . Chronic systolic CHF (congestive heart failure) ([place]) 04/12/2013  . Acute systolic CHF (congestive heart failure), NYHA class 2 ([place]) 04/04/2013  . Hypomagnesemia 04/04/2013  . Hypokalemia 04/01/2013  . Cardiomyopathy ([place]) 04/01/2013  . Ventricular tachycardia, non-sustained ([place]) 04/01/2013  . Atrial fibrillation ([place]) 03/31/2013  . Acute congestive heart failure ([place]) 03/31/2013    Past Medical History:  Past Medical History  Diagnosis Date  . Hypertension   . High cholesterol   . Claudication ([place])   . Glaucoma   . Type II diabetes mellitus ([place])   . Chronic systolic CHF  (congestive heart failure) ([place])     a. 03/2013 Echo: EF 20-25%, diff HK, mild to mod MR, sev dil LA, mild RV dysfxn, sev dil RA, mod TR.  Marland Kitchen Atrial fibrillation ([place])     a. 03/2013 s/p TEE/DCCV;  b. 03/2013 Eliquis initiated.    Past Surgical History: Past Surgical History  Procedure Laterality Date  . Eye surgery Right     "had blood in it" (03/30/2013)  . Tee without cardioversion N/A 04/02/2013    Procedure: TRANSESOPHAGEAL ECHOCARDIOGRAM (TEE);  Surgeon: Thayer Headings, MD;  Location: [place];  Service: Cardiovascular;  Laterality: N/A;  . Cardioversion N/A 04/02/2013    Procedure: CARDIOVERSION;  Surgeon: Thayer Headings, MD;  Location: [place];  Service: Cardiovascular;  Laterality: N/A;  Spoke with Tom     Social History: Social History  Substance Use Topics  . Smoking status: Never Smoker   . Smokeless tobacco: Never Used  . Alcohol Use: No   Additional social history: No prior history of tobacco, alcohol or illicit drug use Please also refer to relevant sections of EMR.  Family History: Family History  Problem Relation Age of Onset  . Other Mother     died in her 65's - 'just got sick.'  . Other Father     died @ 64, unknown cause.  . Diabetes Brother   . Other      12 siblings + 7 more 1/2 siblings.    Allergies and Medications: No Known Allergies No current facility-administered medications on file prior to encounter.   Current Outpatient Prescriptions on File Prior to Encounter  Medication Sig Dispense Refill  . amLODipine (NORVASC) 5 MG tablet TAKE 1 TABLET BY MOUTH EVERY DAY 30 tablet 3  . carvedilol (COREG) 6.25 MG tablet TAKE 1 AND 1/2 TABLETS BY MOUTH TWICE A DAY WITH A MEAL 90 tablet 6  . furosemide (LASIX) 40 MG tablet Take 20 mg by mouth daily.    Marland Kitchen glimepiride (AMARYL) 1 MG tablet Take 1 mg by mouth daily before breakfast.    . latanoprost (XALATAN) 0.005 % ophthalmic solution Place 1 drop into both eyes at bedtime.    . lovastatin  (MEVACOR) 40 MG tablet Take 40 mg by mouth at bedtime.    Marland Kitchen warfarin (COUMADIN) 2 MG tablet Take 2-4 mg by mouth daily. Take two of the 2mg  tablet by mouth every day except on Thursdays take one of the 2mg  tablet per patient    . lisinopril (PRINIVIL,ZESTRIL) 20 MG tablet TAKE 1 TABLET BY MOUTH EVERY MORNING AND 1/2 TABLET EVERY EVENING (Patient not taking: Reported on 07/03/2015) 45 tablet 1  . lovastatin (MEVACOR) 20 MG tablet Take 40 mg by mouth at bedtime. Reported on 07/03/2015    . traMADol (ULTRAM) 50 MG tablet Take 1 tablet (50 mg total) by mouth every 6 (six) hours as needed. (Patient not taking: Reported on 07/03/2015) 15 tablet 0    Objective: BP 100/75 mmHg  Pulse 61  Temp(Src) 98 F (36.7 C) (Oral)  Resp 15  SpO2 99% Exam: General: Frail-appearing elderly female Eyes: Extraocular movements intact, pupils equal and reactive ENTM: Tacky mucous membranes, no cervical lymphadenopathy Neck: Supple Cardiovascular: S1-S2, regular rate and rhythm, no murmurs, rubs or gallops Respiratory: Clear to auscultation bilaterally,  normal effort, no wheezes or crackles Abdomen: Soft, nontender, nondistended, positive bowel sounds, no rebound or guarding MSK: Moves all extremities freely Skin: No rashes Neuro: No gross deficits Psych: Alert and oriented  Labs and Imaging: CBC BMET   Recent Labs Lab 07/03/15 1425  WBC 6.0  HGB 11.1*  HCT 34.4*  PLT 286    Recent Labs Lab 07/03/15 1425  NA 140  K 4.0  CL 101  CO2 24  BUN 57*  CREATININE 4.44*  GLUCOSE 283*  CALCIUM 9.0     Urinalysis    Component Value Date/Time   COLORURINE AMBER* 07/03/2015 1830   APPEARANCEUR CLOUDY* 07/03/2015 1830   LABSPEC 1.016 07/03/2015 1830   PHURINE 5.0 07/03/2015 1830   GLUCOSEU NEGATIVE 07/03/2015 1830   HGBUR MODERATE* 07/03/2015 1830   BILIRUBINUR MODERATE* 07/03/2015 1830   KETONESUR 15* 07/03/2015 1830   PROTEINUR 100* 07/03/2015 1830   NITRITE NEGATIVE 07/03/2015 1830    LEUKOCYTESUR LARGE* 07/03/2015 1830     Rosemarie Ax, MD 07/03/2015, 7:17 PM PGY-3, [place] Intern pager: [phone], text pages welcome

## 2015-07-03 NOTE — ED Notes (Signed)
Pt states for the last 5 days every time she eats she gets sick to her stomach and vomits. Pt states over the last 5 days she has just felt tired because she hasn't been able to eat or drink a lot. Pt denies any pain.

## 2015-07-04 ENCOUNTER — Inpatient Hospital Stay (HOSPITAL_COMMUNITY): Payer: Medicare Other

## 2015-07-04 DIAGNOSIS — N183 Chronic kidney disease, stage 3 unspecified: Secondary | ICD-10-CM | POA: Diagnosis present

## 2015-07-04 DIAGNOSIS — R1310 Dysphagia, unspecified: Secondary | ICD-10-CM

## 2015-07-04 DIAGNOSIS — N179 Acute kidney failure, unspecified: Principal | ICD-10-CM

## 2015-07-04 DIAGNOSIS — R112 Nausea with vomiting, unspecified: Secondary | ICD-10-CM

## 2015-07-04 DIAGNOSIS — I959 Hypotension, unspecified: Secondary | ICD-10-CM

## 2015-07-04 LAB — PHOSPHORUS: PHOSPHORUS: 3.5 mg/dL (ref 2.5–4.6)

## 2015-07-04 LAB — BASIC METABOLIC PANEL
ANION GAP: 7 (ref 5–15)
BUN: 58 mg/dL — ABNORMAL HIGH (ref 6–20)
CALCIUM: 7.8 mg/dL — AB (ref 8.9–10.3)
CO2: 24 mmol/L (ref 22–32)
CREATININE: 3.5 mg/dL — AB (ref 0.44–1.00)
Chloride: 110 mmol/L (ref 101–111)
GFR, EST AFRICAN AMERICAN: 13 mL/min — AB (ref >60)
GFR, EST NON AFRICAN AMERICAN: 11 mL/min — AB (ref >60)
Glucose, Bld: 121 mg/dL — ABNORMAL HIGH (ref 65–99)
Potassium: 3.7 mmol/L (ref 3.5–5.1)
Sodium: 141 mmol/L (ref 135–145)

## 2015-07-04 LAB — CBC
HCT: 28.9 % — ABNORMAL LOW (ref 36.0–46.0)
Hemoglobin: 9.3 g/dL — ABNORMAL LOW (ref 12.0–15.0)
MCH: 27.2 pg (ref 26.0–34.0)
MCHC: 32.2 g/dL (ref 30.0–36.0)
MCV: 84.5 fL (ref 78.0–100.0)
PLATELETS: 249 10*3/uL (ref 150–400)
RBC: 3.42 MIL/uL — AB (ref 3.87–5.11)
RDW: 14.7 % (ref 11.5–15.5)
WBC: 4.9 10*3/uL (ref 4.0–10.5)

## 2015-07-04 LAB — GLUCOSE, CAPILLARY
GLUCOSE-CAPILLARY: 111 mg/dL — AB (ref 65–99)
Glucose-Capillary: 124 mg/dL — ABNORMAL HIGH (ref 65–99)
Glucose-Capillary: 190 mg/dL — ABNORMAL HIGH (ref 65–99)
Glucose-Capillary: 190 mg/dL — ABNORMAL HIGH (ref 65–99)

## 2015-07-04 LAB — TROPONIN I: Troponin I: <0.03 ng/mL (ref <0.031)

## 2015-07-04 LAB — MAGNESIUM: MAGNESIUM: 1.9 mg/dL (ref 1.7–2.4)

## 2015-07-04 LAB — SODIUM, URINE, RANDOM: SODIUM UR: 60 mmol/L

## 2015-07-04 LAB — PROTIME-INR
INR: 4.35 — AB (ref 0.00–1.49)
PROTHROMBIN TIME: 40.4 s — AB (ref 11.6–15.2)

## 2015-07-04 MED ORDER — GLUCERNA SHAKE PO LIQD
237.0000 mL | Freq: Two times a day (BID) | ORAL | Status: DC
  Administered 2015-07-05 (×2): 237 mL via ORAL

## 2015-07-04 MED ORDER — AMLODIPINE BESYLATE 5 MG PO TABS
5.0000 mg | ORAL_TABLET | Freq: Every day | ORAL | Status: DC
  Administered 2015-07-04 – 2015-07-05 (×2): 5 mg via ORAL
  Filled 2015-07-04 (×2): qty 1

## 2015-07-04 NOTE — Progress Notes (Signed)
ANTICOAGULATION CONSULT NOTE  Pharmacy Consult for warfarin Indication: atrial fibrillation  No Known Allergies  Patient Measurements: Total Body Weight: 68kg  Vital Signs: Temp: 97.8 F (36.6 C) (01/13 0941) Temp Source: Oral (01/13 0941) BP: 129/64 mmHg (01/13 0941) Pulse Rate: 55 (01/13 0941)  Labs:  Recent Labs  07/03/15 1425 07/04/15 0550 07/04/15 0915  HGB 11.1* 9.3*  --   HCT 34.4* 28.9*  --   PLT 286 249  --   LABPROT 37.5* 40.4*  --   INR 3.93* 4.35*  --   CREATININE 4.44* 3.50*  --   TROPONINI  --   --  <0.03    CrCl cannot be calculated (Unknown ideal weight.).   Assessment: 82yo F w/ hx of Afib and N/V. Presents w/ sickness over last 5 days, everytime she eats gets sick and vomits, fatigue due to unable to eat or drink. Pharmacy consulted to dose warfarin for Afib. sCr 4.44, CrCl ~13, Hgb 11.1, Plts 286, No s/sx of bleeding INR 3.93 on admission  INR today still elevated  PTA: 4mg  SMTWFS, 2mg  Th (last dose "in past month")  Goal of Therapy:  INR 2-3 Monitor platelets by anticoagulation protocol: Yes   Plan:  Hold dose of warfarin today Follow up daily INR, H&H, monitor s/sx of bleeding,    Thank you Anette Guarneri, PharmD [phone]

## 2015-07-04 NOTE — Progress Notes (Signed)
Family Medicine Teaching Service Daily Progress Note Intern Pager: [phone]  Patient name: Patricia Randall Medical record number: [id number] Date of birth: 10/08/1928 Age: 82 y.o. Gender: female  Primary Care Provider: Leonard Downing, MD Consultants: none Code Status: full  Pt Overview and Major Events to Date:  01/12-admitted with AoCKD and N/V  Assessment and Plan: Zhoie Mckee is a 82 y.o. female presenting with nausea vomiting . PMH is significant for for paroxysmal atrial fibrillation, hypertension, hyperlipidemia, type 2 diabetes, history of systolic heart failure now with normal ejection fraction and chronic kidney disease.  Acute on CKD: improved. most likely secondary to her N/V. Scr 4.44 (baseline of 1.1). She reports still having normal voids. Urinalysis showing large leukocytes and moderate hemoglobin. Denies any dysuria and no fevers or chills. Doesn't appear to be the result of an infection and not trauma as a collection was not the result of in and out cath. - Urine culture  - calculate FeNa  - Normal saline at 125 mL per hour - If creatinine fails consider Renal US  Nausea/vomiting for 5 days: nausea resolved. She reports spiting up some this morning. NBNB. She reported dysphagia like food getting stuck. However, she denies dysphagia this morning although she is NPO -will advance diet as tolerated. -f/u chest x-ray -consider consulting GI if dysphagia  HFpEF on most recent Echo in April 2015: stable. No sign of fluid overload, chest pain or shortness of breath. EKG sinus brady (unchanged from baseline). -Continue Coreg - Holding Lasix for AKI  DM2: no A1c in system. hyperglycemic upon admission despite poor by mouth admission. Also hasn't taken her meds recently. On glimipride 1 mg at home. - f/u A1c  - Will advance diet as tolerated. - Sensitive and daily at bedtime correction  HTN: hypotensive on presenation. Held home meds. Normotensive this morning. - resume  amlodipine - hold lisinopril and lasix for aki  Paroxysmal AFib: Chronically anticoagulated with warfarin. CHADS=4. INR supratheraputic at 4.35 - continue coumadin per pharmacy  Anemia of chronic diesease: Hgb 11.1 on admission (13 in 2014). MCV 84. Hgb 9.3 this morning. Likely hemodilution.  HLD: stable  - Holding statin  FEN/GI:  -Carb modified/NS @100  mL/hr   Prophylaxis: on coumadin for Afib.  Disposition: home pending improvement in AKI, N/V. Lives with her son.  Subjective:  Reports feeling better this morning. Denies nausea. Reports spitting a little this morning. NBNB. Denies dysphagia, abdominal pain, diarrhea, chest pain, shortness of breath, fever and dysuria.  Objective: Temp:  [97.9 F (36.6 C)-98.5 F (36.9 C)] 97.9 F (36.6 C) (01/13 0500) Pulse Rate:  [61-80] 61 (01/13 0500) Resp:  [15-20] 18 (01/13 0500) BP: (92-153)/(49-75) 153/57 mmHg (01/13 0500) SpO2:  [98 %-100 %] 98 % (01/13 0500) Weight:  [151 lb 7.3 oz (68.7 kg)] 151 lb 7.3 oz (68.7 kg) (01/12 2031) Physical Exam: Gen: appears well, no acute distress, pleasant Oropharynx: clear, moist CV: bradycardia, S1 & S2 audible, no edema Resp: no apparent work of breathing, clear to auscultation bilaterally. GI: bowel sounds normal, no tenderness to palpation, no rebound or guarding, no mass.  GU: no suprapubic tenderness, no CVA Skin: no lesion Neuro: AAOx4  Laboratory:  Recent Labs Lab 07/03/15 1425 07/04/15 0550  WBC 6.0 4.9  HGB 11.1* 9.3*  HCT 34.4* 28.9*  PLT 286 249    Recent Labs Lab 07/03/15 1425 07/04/15 0550  NA 140 141  K 4.0 3.7  CL 101 110  CO2 24 24  BUN 57* 58*  CREATININE  4.44* 3.50*  CALCIUM 9.0 7.8*  PROT 6.8  --   BILITOT 0.8  --   ALKPHOS 54  --   ALT 18  --   AST 24  --   GLUCOSE 283* 121*     Imaging/Diagnostic Tests: No results found.   Mercy Riding, MD 07/04/2015, 7:25 AM PGY-1, [place] Intern pager: [phone], text pages  welcome

## 2015-07-04 NOTE — Discharge Instructions (Addendum)
You were admitted for an infection in your intestines that caused diarrhea and vomiting. You were dehydrated and this caused some damage to your kidneys which has improved with IV fluids. You should continue to drink plenty of water. Please call your family doctor on Monday as you will need to follow-up with him next week to monitor your kidneys and blood thinner. Do not take your lisinopril or lasix until after you see him and he says it's ok to restart them.   Information on my medicine - Coumadin   (Warfarin)  This medication education was reviewed with me or my healthcare representative as part of my discharge preparation.   Why was Coumadin prescribed for you? Coumadin was prescribed for you because you have a blood clot or a medical condition that can cause an increased risk of forming blood clots. Blood clots can cause serious health problems by blocking the flow of blood to the heart, lung, or brain. Coumadin can prevent harmful blood clots from forming. As a reminder your indication for Coumadin is:   Stroke Prevention Because Of Atrial Fibrillation  What test will check on my response to Coumadin? While on Coumadin (warfarin) you will need to have an INR test regularly to ensure that your dose is keeping you in the desired range. The INR (international normalized ratio) number is calculated from the result of the laboratory test called prothrombin time (PT).  If an INR APPOINTMENT HAS NOT ALREADY BEEN MADE FOR YOU please schedule an appointment to have this lab work done by your health care provider within 7 days. Your INR goal is usually a number between:  2 to 3 or your provider may give you a more narrow range like 2-2.5.  Ask your health care provider during an office visit what your goal INR is.  What  do you need to  know  About  COUMADIN? Take Coumadin (warfarin) exactly as prescribed by your healthcare provider about the same time each day.  DO NOT stop taking without talking to  the doctor who prescribed the medication.  Stopping without other blood clot prevention medication to take the place of Coumadin may increase your risk of developing a new clot or stroke.  Get refills before you run out.  What do you do if you miss a dose? If you miss a dose, take it as soon as you remember on the same day then continue your regularly scheduled regimen the next day.  Do not take two doses of Coumadin at the same time.  Important Safety Information A possible side effect of Coumadin (Warfarin) is an increased risk of bleeding. You should call your healthcare provider right away if you experience any of the following: ? Bleeding from an injury or your nose that does not stop. ? Unusual colored urine (red or dark [place]) or unusual colored stools (red or black). ? Unusual bruising for unknown reasons. ? A serious fall or if you hit your head (even if there is no bleeding).  Some foods or medicines interact with Coumadin (warfarin) and might alter your response to warfarin. To help avoid this: ? Eat a balanced diet, maintaining a consistent amount of Vitamin K. ? Notify your provider about major diet changes you plan to make. ? Avoid alcohol or limit your intake to 1 drink for women and 2 drinks for men per day. (1 drink is 5 oz. wine, 12 oz. beer, or 1.5 oz. liquor.)  Make sure that ANY health care provider who  prescribes medication for you knows that you are taking Coumadin (warfarin).  Also make sure the healthcare provider who is monitoring your Coumadin knows when you have started a new medication including herbals and non-prescription products.  Coumadin (Warfarin)  Major Drug Interactions  Increased Warfarin Effect Decreased Warfarin Effect  Alcohol (large quantities) Antibiotics (esp. Septra/Bactrim, Flagyl, Cipro) Amiodarone (Cordarone) Aspirin (ASA) Cimetidine (Tagamet) Megestrol (Megace) NSAIDs (ibuprofen, naproxen, etc.) Piroxicam (Feldene) Propafenone (Rythmol  SR) Propranolol (Inderal) Isoniazid (INH) Posaconazole (Noxafil) Barbiturates (Phenobarbital) Carbamazepine (Tegretol) Chlordiazepoxide (Librium) Cholestyramine (Questran) Griseofulvin Oral Contraceptives Rifampin Sucralfate (Carafate) Vitamin K   Coumadin (Warfarin) Major Herbal Interactions  Increased Warfarin Effect Decreased Warfarin Effect  Garlic Ginseng Ginkgo biloba Coenzyme Q10 Green tea St. Johns wort    Coumadin (Warfarin) FOOD Interactions  Eat a consistent number of servings per week of foods HIGH in Vitamin K (1 serving =  cup)  Collards (cooked, or boiled & drained) Kale (cooked, or boiled & drained) Mustard greens (cooked, or boiled & drained) Parsley *serving size only =  cup Spinach (cooked, or boiled & drained) Swiss chard (cooked, or boiled & drained) Turnip greens (cooked, or boiled & drained)  Eat a consistent number of servings per week of foods MEDIUM-HIGH in Vitamin K (1 serving = 1 cup)  Asparagus (cooked, or boiled & drained) Broccoli (cooked, boiled & drained, or raw & chopped) Brussel sprouts (cooked, or boiled & drained) *serving size only =  cup Lettuce, raw (green leaf, endive, romaine) Spinach, raw Turnip greens, raw & chopped   These websites have more information on Coumadin (warfarin):  FailFactory.se; VeganReport.com.au;

## 2015-07-04 NOTE — Progress Notes (Signed)
Initial Nutrition Assessment  DOCUMENTATION CODES:   Severe malnutrition in context of acute illness/injury  INTERVENTION:  Discontinue Boost Breeze.   Provide Glucerna Shake po BID, each supplement provides 220 kcal and 10 grams of protein  Encourage adequate PO intake.   NUTRITION DIAGNOSIS:   Malnutrition related to acute illness as evidenced by energy intake < or equal to 50% for > or equal to 5 days, percent weight loss.  GOAL:   Patient will meet greater than or equal to 90% of their needs  MONITOR:   PO intake, Supplement acceptance, Weight trends, Labs, I & O's  REASON FOR ASSESSMENT:   Consult Poor PO  ASSESSMENT:   82 y.o. female presenting with nausea vomiting . PMH is significant for for paroxysmal atrial fibrillation, hypertension, hyperlipidemia, type 2 diabetes, history of systolic heart failure now with normal ejection fraction and chronic kidney disease.  Pt reports appetite has been improving. Diet has been advanced to a soft diet. Pt reports poor po intake over the past 1 week. Pt has been only able to take bites out of food at meals. Pt reports usual body weight of ~159 lbs. Pt with a 5% weight loss in 1 week. Pt currently has Boost Breeze ordered, however reports she would rather have Glucerna. RD to modify orders. Pt was encouraged to eat her food at meals.   Pt with no observed significant fat or muscle mass loss.   Labs and medications reviewed.   Diet Order:  DIET SOFT Room service appropriate?: Yes; Fluid consistency:: Thin  Skin:  Reviewed, no issues  Last BM:  1/10  Height:   Ht Readings from Last 1 Encounters:  05/23/13 5\' 2"  (1.575 m)    Weight:   Wt Readings from Last 1 Encounters:  07/03/15 151 lb 7.3 oz (68.7 kg)    Ideal Body Weight:  50 kg  BMI:  Body mass index is 27.69 kg/(m^2).  Estimated Nutritional Needs:   Kcal:  1700-1850  Protein:  80-90 grams  Fluid:  Per MD  EDUCATION NEEDS:   No education needs  identified at this time  Corrin Parker, MS, RD, LDN Pager # [phone] After hours/ weekend pager # [phone]

## 2015-07-05 DIAGNOSIS — E43 Unspecified severe protein-calorie malnutrition: Secondary | ICD-10-CM | POA: Diagnosis present

## 2015-07-05 LAB — BASIC METABOLIC PANEL
Anion gap: 11 (ref 5–15)
BUN: 34 mg/dL — ABNORMAL HIGH (ref 6–20)
CALCIUM: 8.3 mg/dL — AB (ref 8.9–10.3)
CO2: 21 mmol/L — ABNORMAL LOW (ref 22–32)
CREATININE: 1.65 mg/dL — AB (ref 0.44–1.00)
Chloride: 113 mmol/L — ABNORMAL HIGH (ref 101–111)
GFR, EST AFRICAN AMERICAN: 32 mL/min — AB (ref >60)
GFR, EST NON AFRICAN AMERICAN: 27 mL/min — AB (ref >60)
Glucose, Bld: 134 mg/dL — ABNORMAL HIGH (ref 65–99)
Potassium: 3.9 mmol/L (ref 3.5–5.1)
SODIUM: 145 mmol/L (ref 135–145)

## 2015-07-05 LAB — GLUCOSE, CAPILLARY
GLUCOSE-CAPILLARY: 146 mg/dL — AB (ref 65–99)
GLUCOSE-CAPILLARY: 124 mg/dL — AB (ref 65–99)

## 2015-07-05 LAB — PROTIME-INR
INR: 3.44 — AB (ref 0.00–1.49)
PROTHROMBIN TIME: 34 s — AB (ref 11.6–15.2)

## 2015-07-05 LAB — HEMOGLOBIN A1C
Hgb A1c MFr Bld: 8.8 % — ABNORMAL HIGH (ref 4.8–5.6)
MEAN PLASMA GLUCOSE: 206 mg/dL

## 2015-07-05 LAB — UREA NITROGEN, URINE: Urea Nitrogen, Ur: 215 mg/dL

## 2015-07-05 MED ORDER — TRAMADOL HCL 50 MG PO TABS: 50.0000 mg | ORAL_TABLET | Freq: Four times a day (QID) | ORAL | Status: DC | PRN

## 2015-07-05 NOTE — Discharge Summary (Signed)
[facility] Discharge Summary  Patient name: Patricia Randall Medical record number: [id number] Date of birth: 08-Jun-1929 Age: 82 y.o. Gender: female Date of Admission: 07/03/2015  Date of Discharge: 07/05/2015 Admitting Physician: Alveda Reasons, MD  Primary Care Provider: Leonard Downing, MD Consultants: none  Indication for Hospitalization: nausea and vomiting  Discharge Diagnoses/Problem List:  CKD, HFpEF, hypertension, DIABETES-2, PAFib  Disposition: home  Discharge Condition: improved and stable  Discharge Exam: Temp: [97.6 F (36.4 C)-98.5 F (36.9 C)] 97.6 F (36.4 C) (01/14 0850) Pulse Rate: [54-74] 54 (01/14 0850) Resp: [16-20] 18 (01/14 0850) BP: (124-176)/(62-68) 176/62 mmHg (01/14 0850) SpO2: [95 %-100 %] 100 % (01/14 0850) Weight: [157 lb 3 oz (71.3 kg)] 157 lb 3 oz (71.3 kg) (01/13 2029)  Physical Exam: Gen: appears well, no acute distress, pleasant Oropharynx: clear, moist CV: bradycardia, S1 & S2 audible, no edema Resp: no increased work of breathing, clear to auscultation bilaterally. GI: bowel sounds normal, no tenderness to palpation, no rebound or guarding, no mass.  GU: no suprapubic tenderness, no CVA tenderness Skin: no lesion Neuro: AAOx4  Brief Hospital Course:  Patricia Randall is a 82 y.o. female admitted with nausea and vomiting for 5 days. PMH is significant for for paroxysmal atrial fibrillation, hypertension, hyperlipidemia, type 2 diabetes, history of systolic heart failure now with normal ejection fraction and chronic kidney disease.  Nausea/vomiting: resolved. Emesis was NBNB. She reported dysphagia like food getting stuck on admission. As a result of this she was kept NPO. However, she denied dysphagia the follow morning. Nausea and vomiting also resolved. She ate her meals without any problem.   Acute on CKD: improved. most likely secondary to her nausea and vomiting. She was hypotensive on presentation.  Serum creatinine 4.44 on admission; trended down to 1.65 (baseline of 1.1) with intravenous fluid. Urinalysis with many bacteria, large leukocytes and moderate hemoglobin. Urine culture with pansensitive E. Coli. This is likely colonization vs UTI in the absence of dysuria, fevers, chills or glucosuria. Recommend treating with 5 days of Keflex if she endorses symptom at follow up.  HFpEF on most recent Echo in April 2015: stable. No sign of fluid overload, chest pain or shortness of breath. EKG sinus brady (unchanged from baseline). Held home lasix while in house and resumed at discharge.  Paroxysmal AFib: Chronically anticoagulated with warfarin. CHADS=4. INR supratheraputic at 4.35 on admission and 3.44 on discharge.  Issues for Follow Up:  1. Acute on chronic kidney disease: recommend check BMP at follow up 2. Colonization: urine culture with pansensitive E. Coli, >=100,000 colonies/mL but patient without symptoms. Consider treating if symptomatic at follow up 3. Paroxysmal AFib: INR 3.44 (4.35 on admission). Recheck INR  Significant Procedures: none  Significant Labs and Imaging:   Recent Labs Lab 07/03/15 1425 07/04/15 0550  WBC 6.0 4.9  HGB 11.1* 9.3*  HCT 34.4* 28.9*  PLT 286 249    Recent Labs Lab 07/03/15 1425 07/04/15 0550 07/05/15 0633  NA 140 141 145  K 4.0 3.7 3.9  CL 101 110 113*  CO2 24 24 21*  GLUCOSE 283* 121* 134*  BUN 57* 58* 34*  CREATININE 4.44* 3.50* 1.65*  CALCIUM 9.0 7.8* 8.3*  MG  --  1.9  --   PHOS  --  3.5  --   ALKPHOS 54  --   --   AST 24  --   --   ALT 18  --   --   ALBUMIN 2.9*  --   --  Results/Tests Pending at Time of Discharge: none  Discharge Medications:    Medication List    STOP taking these medications        furosemide 40 MG tablet  Commonly known as:  LASIX     lisinopril 20 MG tablet  Commonly known as:  PRINIVIL,ZESTRIL     ondansetron 8 MG disintegrating tablet  Commonly known as:  ZOFRAN-ODT      TAKE  these medications        amLODipine 5 MG tablet  Commonly known as:  NORVASC  TAKE 1 TABLET BY MOUTH EVERY DAY     carvedilol 6.25 MG tablet  Commonly known as:  COREG  TAKE 1 AND 1/2 TABLETS BY MOUTH TWICE A DAY WITH A MEAL     glimepiride 1 MG tablet  Commonly known as:  AMARYL  Take 1 mg by mouth daily before breakfast.     latanoprost 0.005 % ophthalmic solution  Commonly known as:  XALATAN  Place 1 drop into both eyes at bedtime.     lovastatin 40 MG tablet  Commonly known as:  MEVACOR  Take 40 mg by mouth at bedtime.     traMADol 50 MG tablet  Commonly known as:  ULTRAM  Take 1 tablet (50 mg total) by mouth every 6 (six) hours as needed.     traMADol 50 MG tablet  Commonly known as:  ULTRAM  Take 1 tablet (50 mg total) by mouth every 6 (six) hours as needed.     warfarin 2 MG tablet  Commonly known as:  COUMADIN  Take 2-4 mg by mouth daily. Take two of the 2mg  tablet by mouth every day except on Thursdays take one of the 2mg  tablet per patient        Discharge Instructions: Please refer to Patient Instructions section of EMR for full details.  Patient was counseled important signs and symptoms that should prompt return to medical care, changes in medications, dietary instructions, activity restrictions, and follow up appointments.   Follow-Up Appointments: Follow-up Information    Follow up with Leonard Downing, MD. Schedule an appointment as soon as possible for a visit in 3 days.   Specialty:  Family Medicine   Why:  Tuesday January 17th at 10:30am   Contact information:   [place] [id number] [phone]       Mercy Riding, MD 07/06/2015, 8:03 PM PGY-1, [place]

## 2015-07-05 NOTE — Progress Notes (Signed)
ANTICOAGULATION CONSULT NOTE  Pharmacy Consult for warfarin Indication: atrial fibrillation  No Known Allergies  Patient Measurements: Total Body Weight: 68kg  Vital Signs: Temp: 97.6 F (36.4 C) (01/14 0850) Temp Source: Oral (01/14 0850) BP: 176/62 mmHg (01/14 0850) Pulse Rate: 54 (01/14 0850)  Labs:  Recent Labs  07/03/15 1425 07/04/15 0550 07/04/15 0915 07/05/15 0633  HGB 11.1* 9.3*  --   --   HCT 34.4* 28.9*  --   --   PLT 286 249  --   --   LABPROT 37.5* 40.4*  --  34.0*  INR 3.93* 4.35*  --  3.44*  CREATININE 4.44* 3.50*  --  1.65*  TROPONINI  --   --  <0.03  --     Estimated Creatinine Clearance: 23.5 mL/min (by C-G formula based on Cr of 1.65).   Assessment: 82yo F w/ hx of Afib and N/V. Presents w/ sickness over last 5 days, everytime she eats gets sick and vomits, fatigue due to unable to eat or drink. Pharmacy consulted to dose warfarin for Afib.  No s/sx bleeding noted. INR remains supratherapeutic at 3.44. Hgb 9.3, Plts 249 (on 1/13)  PTA: 4mg  SMTWFS, 2mg  Th (last dose "in past month")  Goal of Therapy:  INR 2-3 Monitor platelets by anticoagulation protocol: Yes   Plan:  Hold dose of warfarin today Follow up daily INR, H&H, monitor s/sx of bleeding,    Bennye Alm, PharmD Pharmacy Resident [phone]

## 2015-07-05 NOTE — Progress Notes (Signed)
Family Medicine Teaching Service Daily Progress Note Intern Pager: [phone]  Patient name: Patricia Randall Medical record number: [id number] Date of birth: 1929-03-25 Age: 82 y.o. Gender: female  Primary Care Provider: Leonard Downing, MD Consultants: none Code Status: full  Pt Overview and Major Events to Date:  01/12-admitted with AoCKD and N/V  Assessment and Plan: Patricia Randall is a 82 y.o. female presenting with nausea vomiting . PMH is significant for for paroxysmal atrial fibrillation, hypertension, hyperlipidemia, type 2 diabetes, history of systolic heart failure now with normal ejection fraction and chronic kidney disease.  Acute on CKD: improved. most likely secondary to her N/V. Scr 4.44 (baseline of 1.1). She reports still having normal voids. Urinalysis showing large leukocytes and moderate hemoglobin. Denies any dysuria and no fevers or chills - Urine culture pending - SLIV - encourage PO hydration and hold ACE and loop until PCP f/u  Nausea/vomiting for 5 days: nausea resolved. She denies dysphagia this morning -tolerating normal diet  HFpEF on most recent Echo in April 2015: stable. No sign of fluid overload, chest pain or shortness of breath. EKG sinus brady (unchanged from baseline). -Continue Coreg - Holding Lasix for AKI  DM2: A1c 8.8. hyperglycemic upon admission despite poor by mouth admission. Also hasn't taken her meds recently. On glimiperide 1 mg at home.  - Will advance diet as tolerated. - Sensitive SSI and bedtime correction - restart glimepiride on d/c  HTN: Normotensive this morning. - continue amlodipine, coreg - holding lisinopril and lasix for aki  Paroxysmal AFib: Chronically anticoagulated with warfarin. CHADS=4. INR supratheraputic - continue coumadin per pharmacy - f/u with PCP next week for INR check and adjustment  HLD: stable  - Holding statin, restart on d/c  FEN/GI:  -Carb modified, SLIV  Prophylaxis: on coumadin for  Afib.  Disposition: home today Lives with her son.  Subjective:  Reports feeling better this morning. Denies nausea. Denies dysphagia, abdominal pain, diarrhea, chest pain, shortness of breath, fever and dysuria. Feels ready to go home and has son and granddaughter to help  Objective: Temp:  [97.6 F (36.4 C)-98.5 F (36.9 C)] 97.6 F (36.4 C) (01/14 0850) Pulse Rate:  [54-74] 54 (01/14 0850) Resp:  [16-20] 18 (01/14 0850) BP: (124-176)/(62-68) 176/62 mmHg (01/14 0850) SpO2:  [95 %-100 %] 100 % (01/14 0850) Weight:  [157 lb 3 oz (71.3 kg)] 157 lb 3 oz (71.3 kg) (01/13 2029) Physical Exam: Gen: appears well, no acute distress, pleasant Oropharynx: clear, moist CV: bradycardia, S1 & S2 audible, no edema Resp: no increased work of breathing, clear to auscultation bilaterally. GI: bowel sounds normal, no tenderness to palpation, no rebound or guarding, no mass.  GU: no suprapubic tenderness, no CVA tenderness Skin: no lesion Neuro: AAOx4  Laboratory:  Recent Labs Lab 07/03/15 1425 07/04/15 0550  WBC 6.0 4.9  HGB 11.1* 9.3*  HCT 34.4* 28.9*  PLT 286 249    Recent Labs Lab 07/03/15 1425 07/04/15 0550 07/05/15 0633  NA 140 141 145  K 4.0 3.7 3.9  CL 101 110 113*  CO2 24 24 21*  BUN 57* 58* 34*  CREATININE 4.44* 3.50* 1.65*  CALCIUM 9.0 7.8* 8.3*  PROT 6.8  --   --   BILITOT 0.8  --   --   ALKPHOS 54  --   --   ALT 18  --   --   AST 24  --   --   GLUCOSE 283* 121* 134*   A1c 8.8   07/03/2015 14:25  07/04/2015 05:50 07/05/2015 06:33  Prothrombin Time 37.5 (H) 40.4 (H) 34.0 (H)  INR 3.93 (H) 4.35 (H) 3.44 (H)   Imaging/Diagnostic Tests: Dg Chest 2 View  07/04/2015  CLINICAL DATA:  Cough, dysphagia, and postprandial vomiting. Acute renal insufficiency, history of CHF and diabetes EXAM: CHEST  2 VIEW COMPARISON:  Portable chest x-ray of March 30, 2013 FINDINGS: The lungs are well-expanded. There is no focal infiltrate. The cardiac silhouette is mildly enlarged but  is less conspicuous than on the previous study. The pulmonary vascularity is prominent centrally but cephalization has decreased. The pulmonary interstitial edema seen previously is also less conspicuous. There is no pleural effusion or alveolar pneumonia. The mediastinum is normal in width. There is tortuosity of the descending thoracic aorta. The bony thorax exhibits no acute abnormality. IMPRESSION: Low-grade CHF with mild pulmonary interstitial edema. The findings have improved since the previous study. There is no alveolar pneumonia. Electronically Signed   By: David  Martinique M.D.   On: 07/04/2015 09:40     Frazier Richards, MD 07/05/2015, 9:11 AM PGY-3, [place] Intern pager: [phone], text pages welcome

## 2015-07-06 LAB — URINE CULTURE

## 2015-08-06 ENCOUNTER — Other Ambulatory Visit (HOSPITAL_COMMUNITY): Payer: Self-pay | Admitting: Internal Medicine

## 2015-08-24 ENCOUNTER — Other Ambulatory Visit (HOSPITAL_COMMUNITY): Payer: Self-pay | Admitting: Cardiology

## 2015-10-15 ENCOUNTER — Ambulatory Visit (INDEPENDENT_AMBULATORY_CARE_PROVIDER_SITE_OTHER): Payer: Medicare Other | Admitting: Ophthalmology

## 2015-11-19 ENCOUNTER — Other Ambulatory Visit (HOSPITAL_COMMUNITY): Payer: Self-pay | Admitting: Internal Medicine

## 2016-02-05 ENCOUNTER — Other Ambulatory Visit (HOSPITAL_COMMUNITY): Payer: Self-pay | Admitting: Cardiology

## 2016-03-26 ENCOUNTER — Other Ambulatory Visit (HOSPITAL_COMMUNITY): Payer: Self-pay | Admitting: Internal Medicine

## 2016-05-08 ENCOUNTER — Other Ambulatory Visit (HOSPITAL_COMMUNITY): Payer: Self-pay | Admitting: Cardiology

## 2016-05-24 ENCOUNTER — Encounter (HOSPITAL_COMMUNITY): Payer: Self-pay

## 2016-05-24 ENCOUNTER — Inpatient Hospital Stay (HOSPITAL_COMMUNITY): Payer: Medicare Other

## 2016-05-24 ENCOUNTER — Inpatient Hospital Stay (HOSPITAL_COMMUNITY)
Admission: EM | Admit: 2016-05-24 | Discharge: 2016-05-27 | DRG: 378 | Disposition: A | Discharge status: Home / Self Care | Payer: Medicare Other | Attending: Family Medicine | Admitting: Family Medicine

## 2016-05-24 DIAGNOSIS — Z7901 Long term (current) use of anticoagulants: Secondary | ICD-10-CM

## 2016-05-24 DIAGNOSIS — K921 Melena: Secondary | ICD-10-CM | POA: Diagnosis present

## 2016-05-24 DIAGNOSIS — I130 Hypertensive heart and chronic kidney disease with heart failure and stage 1 through stage 4 chronic kidney disease, or unspecified chronic kidney disease: Secondary | ICD-10-CM | POA: Diagnosis present

## 2016-05-24 DIAGNOSIS — Z79899 Other long term (current) drug therapy: Secondary | ICD-10-CM | POA: Diagnosis not present

## 2016-05-24 DIAGNOSIS — K222 Esophageal obstruction: Secondary | ICD-10-CM | POA: Diagnosis present

## 2016-05-24 DIAGNOSIS — N183 Chronic kidney disease, stage 3 unspecified: Secondary | ICD-10-CM | POA: Diagnosis present

## 2016-05-24 DIAGNOSIS — K449 Diaphragmatic hernia without obstruction or gangrene: Secondary | ICD-10-CM | POA: Diagnosis present

## 2016-05-24 DIAGNOSIS — E43 Unspecified severe protein-calorie malnutrition: Secondary | ICD-10-CM

## 2016-05-24 DIAGNOSIS — K210 Gastro-esophageal reflux disease with esophagitis: Secondary | ICD-10-CM | POA: Diagnosis present

## 2016-05-24 DIAGNOSIS — D649 Anemia, unspecified: Secondary | ICD-10-CM | POA: Diagnosis not present

## 2016-05-24 DIAGNOSIS — I10 Essential (primary) hypertension: Secondary | ICD-10-CM | POA: Diagnosis not present

## 2016-05-24 DIAGNOSIS — R55 Syncope and collapse: Secondary | ICD-10-CM | POA: Diagnosis present

## 2016-05-24 DIAGNOSIS — N184 Chronic kidney disease, stage 4 (severe): Secondary | ICD-10-CM

## 2016-05-24 DIAGNOSIS — I5042 Chronic combined systolic (congestive) and diastolic (congestive) heart failure: Secondary | ICD-10-CM | POA: Diagnosis present

## 2016-05-24 DIAGNOSIS — I5032 Chronic diastolic (congestive) heart failure: Secondary | ICD-10-CM

## 2016-05-24 DIAGNOSIS — E7800 Pure hypercholesterolemia, unspecified: Secondary | ICD-10-CM | POA: Diagnosis present

## 2016-05-24 DIAGNOSIS — I482 Chronic atrial fibrillation: Secondary | ICD-10-CM | POA: Diagnosis present

## 2016-05-24 DIAGNOSIS — D68318 Other hemorrhagic disorder due to intrinsic circulating anticoagulants, antibodies, or inhibitors: Secondary | ICD-10-CM | POA: Diagnosis present

## 2016-05-24 DIAGNOSIS — D62 Acute posthemorrhagic anemia: Secondary | ICD-10-CM | POA: Diagnosis present

## 2016-05-24 DIAGNOSIS — K922 Gastrointestinal hemorrhage, unspecified: Secondary | ICD-10-CM | POA: Diagnosis present

## 2016-05-24 DIAGNOSIS — Z833 Family history of diabetes mellitus: Secondary | ICD-10-CM | POA: Diagnosis not present

## 2016-05-24 DIAGNOSIS — K259 Gastric ulcer, unspecified as acute or chronic, without hemorrhage or perforation: Secondary | ICD-10-CM | POA: Diagnosis present

## 2016-05-24 DIAGNOSIS — E1151 Type 2 diabetes mellitus with diabetic peripheral angiopathy without gangrene: Secondary | ICD-10-CM | POA: Diagnosis present

## 2016-05-24 DIAGNOSIS — E1122 Type 2 diabetes mellitus with diabetic chronic kidney disease: Secondary | ICD-10-CM | POA: Diagnosis present

## 2016-05-24 DIAGNOSIS — I4821 Permanent atrial fibrillation: Secondary | ICD-10-CM | POA: Diagnosis present

## 2016-05-24 LAB — CBC WITH DIFFERENTIAL/PLATELET
BASOS PCT: 0 %
Basophils Absolute: 0 10*3/uL (ref 0.0–0.1)
Eosinophils Absolute: 0 10*3/uL (ref 0.0–0.7)
Eosinophils Relative: 0 %
HEMATOCRIT: 20.3 % — AB (ref 36.0–46.0)
HEMOGLOBIN: 6.5 g/dL — AB (ref 12.0–15.0)
LYMPHS PCT: 16 %
Lymphs Abs: 1.2 10*3/uL (ref 0.7–4.0)
MCH: 26.5 pg (ref 26.0–34.0)
MCHC: 32 g/dL (ref 30.0–36.0)
MCV: 82.9 fL (ref 78.0–100.0)
MONOS PCT: 4 %
Monocytes Absolute: 0.3 10*3/uL (ref 0.1–1.0)
NEUTROS ABS: 5.7 10*3/uL (ref 1.7–7.7)
NEUTROS PCT: 80 %
Platelets: 201 10*3/uL (ref 150–400)
RBC: 2.45 MIL/uL — ABNORMAL LOW (ref 3.87–5.11)
RDW: 15.9 % — ABNORMAL HIGH (ref 11.5–15.5)
WBC: 7.2 10*3/uL (ref 4.0–10.5)

## 2016-05-24 LAB — GLUCOSE, CAPILLARY
GLUCOSE-CAPILLARY: 106 mg/dL — AB (ref 65–99)
GLUCOSE-CAPILLARY: 124 mg/dL — AB (ref 65–99)

## 2016-05-24 LAB — RETICULOCYTES
RBC.: 2.7 MIL/uL — ABNORMAL LOW (ref 3.87–5.11)
RETIC CT PCT: 2.9 % (ref 0.4–3.1)
Retic Count, Absolute: 78.3 10*3/uL (ref 19.0–186.0)

## 2016-05-24 LAB — PROTIME-INR
INR: 4.16
Prothrombin Time: 41.6 seconds — ABNORMAL HIGH (ref 11.4–15.2)

## 2016-05-24 LAB — IRON AND TIBC
IRON: 151 ug/dL (ref 28–170)
Saturation Ratios: 57 % — ABNORMAL HIGH (ref 10.4–31.8)
TIBC: 266 ug/dL (ref 250–450)
UIBC: 115 ug/dL

## 2016-05-24 LAB — COMPREHENSIVE METABOLIC PANEL
ALBUMIN: 2.8 g/dL — AB (ref 3.5–5.0)
ALK PHOS: 36 U/L — AB (ref 38–126)
ALT: 9 U/L — ABNORMAL LOW (ref 14–54)
AST: 14 U/L — AB (ref 15–41)
Anion gap: 8 (ref 5–15)
BILIRUBIN TOTAL: 0.3 mg/dL (ref 0.3–1.2)
BUN: 63 mg/dL — AB (ref 6–20)
CALCIUM: 8.5 mg/dL — AB (ref 8.9–10.3)
CO2: 25 mmol/L (ref 22–32)
Chloride: 109 mmol/L (ref 101–111)
Creatinine, Ser: 1.87 mg/dL — ABNORMAL HIGH (ref 0.44–1.00)
GFR calc Af Amer: 27 mL/min — ABNORMAL LOW (ref >60)
GFR, EST NON AFRICAN AMERICAN: 23 mL/min — AB (ref >60)
GLUCOSE: 156 mg/dL — AB (ref 65–99)
Potassium: 3.9 mmol/L (ref 3.5–5.1)
Sodium: 142 mmol/L (ref 135–145)
TOTAL PROTEIN: 5.5 g/dL — AB (ref 6.5–8.1)

## 2016-05-24 LAB — VITAMIN B12: VITAMIN B 12: 299 pg/mL (ref 180–914)

## 2016-05-24 LAB — CK: Total CK: 45 U/L (ref 38–234)

## 2016-05-24 LAB — ABO/RH: ABO/RH(D): O POS

## 2016-05-24 LAB — FOLATE: Folate: 19.2 ng/mL (ref >5.9)

## 2016-05-24 LAB — CBG MONITORING, ED: Glucose-Capillary: 145 mg/dL — ABNORMAL HIGH (ref 65–99)

## 2016-05-24 LAB — FERRITIN: Ferritin: 20 ng/mL (ref 11–307)

## 2016-05-24 LAB — PREPARE RBC (CROSSMATCH)

## 2016-05-24 MED ORDER — ONDANSETRON HCL 4 MG PO TABS: 4.0000 mg | ORAL_TABLET | Freq: Four times a day (QID) | ORAL | Status: DC | PRN

## 2016-05-24 MED ORDER — SODIUM CHLORIDE 0.9 % IV SOLN
80.0000 mg | Freq: Once | INTRAVENOUS | Status: AC
  Administered 2016-05-24: 80 mg via INTRAVENOUS
  Filled 2016-05-24: qty 80

## 2016-05-24 MED ORDER — VITAMIN K1 10 MG/ML IJ SOLN
10.0000 mg | Freq: Once | INTRAVENOUS | Status: DC
  Filled 2016-05-24: qty 1

## 2016-05-24 MED ORDER — LATANOPROST 0.005 % OP SOLN
1.0000 [drp] | Freq: Every day | OPHTHALMIC | Status: DC
  Administered 2016-05-24 – 2016-05-26 (×3): 1 [drp] via OPHTHALMIC
  Filled 2016-05-24: qty 2.5

## 2016-05-24 MED ORDER — ACETAMINOPHEN 500 MG PO TABS
1000.0000 mg | ORAL_TABLET | Freq: Once | ORAL | Status: AC
  Administered 2016-05-24: 1000 mg via ORAL
  Filled 2016-05-24: qty 2

## 2016-05-24 MED ORDER — PRAVASTATIN SODIUM 40 MG PO TABS
40.0000 mg | ORAL_TABLET | Freq: Every day | ORAL | Status: DC
  Administered 2016-05-24 – 2016-05-26 (×3): 40 mg via ORAL
  Filled 2016-05-24 (×3): qty 1

## 2016-05-24 MED ORDER — PANTOPRAZOLE SODIUM 40 MG IV SOLR
40.0000 mg | Freq: Two times a day (BID) | INTRAVENOUS | Status: DC
  Filled 2016-05-24: qty 40

## 2016-05-24 MED ORDER — VITAMIN K1 10 MG/ML IJ SOLN
5.0000 mg | Freq: Once | INTRAVENOUS | Status: AC
  Administered 2016-05-24: 5 mg via INTRAVENOUS
  Filled 2016-05-24: qty 0.5

## 2016-05-24 MED ORDER — ACETAMINOPHEN 650 MG RE SUPP
650.0000 mg | Freq: Four times a day (QID) | RECTAL | Status: DC | PRN
Start: 2016-05-24 — End: 2016-05-27

## 2016-05-24 MED ORDER — ONDANSETRON HCL 4 MG/2ML IJ SOLN: 4.0000 mg | Freq: Four times a day (QID) | INTRAMUSCULAR | Status: DC | PRN

## 2016-05-24 MED ORDER — LISINOPRIL 20 MG PO TABS
20.0000 mg | ORAL_TABLET | Freq: Every day | ORAL | Status: DC
Start: 2016-05-25 — End: 2016-05-27
  Administered 2016-05-25 – 2016-05-27 (×3): 20 mg via ORAL
  Filled 2016-05-24 (×3): qty 1

## 2016-05-24 MED ORDER — INSULIN ASPART 100 UNIT/ML ~~LOC~~ SOLN
0.0000 [IU] | SUBCUTANEOUS | Status: DC
  Administered 2016-05-26: 1 [IU] via SUBCUTANEOUS

## 2016-05-24 MED ORDER — AMLODIPINE BESYLATE 5 MG PO TABS
5.0000 mg | ORAL_TABLET | Freq: Every day | ORAL | Status: DC
  Administered 2016-05-25: 5 mg via ORAL
  Filled 2016-05-24 (×2): qty 1

## 2016-05-24 MED ORDER — ACETAMINOPHEN 325 MG PO TABS: 650.0000 mg | ORAL_TABLET | Freq: Four times a day (QID) | ORAL | Status: DC | PRN

## 2016-05-24 MED ORDER — SODIUM CHLORIDE 0.9 % IV SOLN
INTRAVENOUS | Status: DC
  Administered 2016-05-24 – 2016-05-26 (×4): via INTRAVENOUS

## 2016-05-24 MED ORDER — SODIUM CHLORIDE 0.9 % IV SOLN
10.0000 mL/h | Freq: Once | INTRAVENOUS | Status: AC
  Administered 2016-05-24: 10 mL/h via INTRAVENOUS

## 2016-05-24 MED ORDER — CARVEDILOL 6.25 MG PO TABS
9.3750 mg | ORAL_TABLET | Freq: Two times a day (BID) | ORAL | Status: DC
  Administered 2016-05-25 – 2016-05-27 (×5): 9.375 mg via ORAL
  Filled 2016-05-24 (×5): qty 1

## 2016-05-24 NOTE — ED Notes (Signed)
Informed consent signed at bedside.

## 2016-05-24 NOTE — ED Notes (Signed)
Pt CBG was 145 notified Erin [employer] banker, corporate)

## 2016-05-24 NOTE — ED Notes (Signed)
EDP made aware of critical hgb of 6.5

## 2016-05-24 NOTE — ED Notes (Signed)
Pt presented to ER in wheelchair with husband. Registration called RN for unresponsive patient. She stated patient was wheeled up with husband and started shouting "oh oh oh" Upon RN arrival patient head and neck was shaking- patient not responding to verbal or painful stimuli. Patient radial pulse present. Patient wheeled back to stretcher and then became responsive. Patient states I just felt weak- patient states she recollects events at registration and states I just didn't feel like talking cause I was weak." Primary RN Junie Panning made aware.

## 2016-05-24 NOTE — H&P (Signed)
History and Physical  Patient Name: Patricia Randall     OMV:[id number]    DOB: [birth year]    DOA: 05/24/2016 PCP: Leonard Downing, MD   Patient coming from: Home  Chief Complaint: Leg weakness  HPI: Patricia Randall is a 82 y.o. female with a past medical history significant for Afib on warfarin, HFpEF, NIDDM, HTN, and CKD IV who presents with leg weakness.  The patient was in her usual state of health until this weekend when she felt vague malaise, "like something was coming on". She was getting ready for church yesterday, her legs gave out, and she was too weak to go so she stayed home. She did have an episode of black sticky bowel movements, but no fever, chills, cough, vomiting, syncope.  Today she still felt weak, so she came to the ER.  ED course: -Afebrile, heart rate 80, respirations and pulse ox normal, blood pressure 160/93 -Na 142, K 3.9, Cr 1.8 (baseline 1.3-1.6), WBC 7.2 K, Hgb 6.5 -INR 4.16 -CK normal -There was melena reportedly on exam -She was given vitamin K to reverse her INR -The case was discussed with TR[facility] who asked for CT head, it appears that then a bed request was placed before the patient was accepted for admission    She denies NSAID use. She's never had a GI bleed.  INR was low last week, so she had increased her warfarin per her PCP.     ROS: Review of Systems  Constitutional: Positive for malaise/fatigue.  Gastrointestinal: Positive for melena.  Neurological: Positive for weakness.  All other systems reviewed and are negative.         Past Medical History:  Diagnosis Date  . Atrial fibrillation ([place])    a. 03/2013 s/p TEE/DCCV;  b. 03/2013 Eliquis initiated.  . Chronic systolic CHF (congestive heart failure) ([place])    a. 03/2013 Echo: EF 20-25%, diff HK, mild to mod MR, sev dil LA, mild RV dysfxn, sev dil RA, mod TR.  . Claudication ([place])   . Glaucoma   . High cholesterol   . Hypertension   . Type II diabetes mellitus ([place])     Past Surgical  History:  Procedure Laterality Date  . CARDIOVERSION N/A 04/02/2013   Procedure: CARDIOVERSION;  Surgeon: Thayer Headings, MD;  Location: [facility];  Service: Cardiovascular;  Laterality: N/A;  Spoke with Tom   . EYE SURGERY Right    "had blood in it"   . TEE WITHOUT CARDIOVERSION N/A 04/02/2013   Procedure: TRANSESOPHAGEAL ECHOCARDIOGRAM (TEE);  Surgeon: Thayer Headings, MD;  Location: [facility];  Service: Cardiovascular;  Laterality: N/A;    Social History: Patient lives with her son.  The patient walks unassisted.  She used to work for [employer]  She did not smoke or use alcohol.    No Known Allergies  Family history: family history includes Diabetes in her brother; Other in her father and mother.  Prior to Admission medications   Medication Sig Start Date End Date Taking? Authorizing Provider  amLODipine (NORVASC) 5 MG tablet TAKE 1 TABLET BY MOUTH EVERY DAY 03/26/16  Yes Jolaine Artist, MD  Biotin 10 MG CAPS Take 10 mg by mouth daily.   Yes Historical Provider, MD  carvedilol (COREG) 6.25 MG tablet TAKE 1 AND 1/2 TABLETS BY MOUTH TWICE A DAY WITH A MEAL 05/10/16  Yes Larey Dresser, MD  cholecalciferol (VITAMIN D) 1000 units tablet Take 1,000 Units by mouth daily.   Yes Historical Provider, MD  glimepiride (AMARYL) 1 MG tablet Take 1 mg by mouth daily before breakfast.   Yes Historical Provider, MD  latanoprost (XALATAN) 0.005 % ophthalmic solution Place 1 drop into both eyes at bedtime.   Yes Historical Provider, MD  lisinopril (PRINIVIL,ZESTRIL) 20 MG tablet Take 20 mg by mouth daily.   Yes Historical Provider, MD  lovastatin (MEVACOR) 40 MG tablet Take 40 mg by mouth at bedtime.   Yes Historical Provider, MD  warfarin (COUMADIN) 2 MG tablet Take 2-4 mg by mouth daily. 4 mg everyday. Except on Sunday patient takes 2 mg   Yes Historical Provider, MD  traMADol (ULTRAM) 50 MG tablet Take 1 tablet (50 mg total) by mouth every 6 (six) hours as needed. Patient not  taking: Reported on 05/24/2016 05/30/14   Harvie Heck, PA-C  traMADol (ULTRAM) 50 MG tablet Take 1 tablet (50 mg total) by mouth every 6 (six) hours as needed. Patient not taking: Reported on 05/24/2016 07/05/15   Veatrice Bourbon, MD       Physical Exam: BP 128/63 (BP Location: Right Arm)   Pulse 80   Temp 98.4 F (36.9 C) (Oral)   Resp 18   Ht 5\' 5"  (1.651 m)   Wt 69.6 kg (153 lb 8 oz)   SpO2 100%   BMI 25.54 kg/m  General appearance: Small elderly adult female, alert and in no acute distress.   Eyes: Anicteric, conjunctiva pink, lids and lashes normal. PERRL.    ENT: No nasal deformity, discharge, epistaxis.  Hearing normal. OP moist without lesions.   Neck: No neck masses.  Trachea midline.  No thyromegaly/tenderness. Lymph: No cervical or supraclavicular lymphadenopathy. Skin: Warm and dry.  No jaundice.  No suspicious rashes or lesions. Cardiac: RRR, nl S1-S2, no murmurs appreciated.  Capillary refill is brisk.  JVP normal.  No LE edema.  Radial and DP pulses 2+ and symmetric. Respiratory: Normal respiratory rate and rhythm.  CTAB without rales or wheezes. Abdomen: Abdomen soft.  No TTP. No ascites, distension, hepatosplenomegaly.   MSK: No deformities or effusions.  No cyanosis or clubbing. Neuro: Cranial nerves normal.  Sensation intact to light touch. Speech is fluent.  Muscle strength normal.    Psych: Sensorium intact and responding to questions, attention normal.  Behavior appropriate.  Affect normal.  Judgment and insight appear normal.     Labs on Admission:  I have personally reviewed following labs and imaging studies: CBC:  Recent Labs Lab 05/24/16 1503  WBC 7.2  NEUTROABS 5.7  HGB 6.5*  HCT 20.3*  MCV 82.9  PLT 989   Basic Metabolic Panel:  Recent Labs Lab 05/24/16 1503  NA 142  K 3.9  CL 109  CO2 25  GLUCOSE 156*  BUN 63*  CREATININE 1.87*  CALCIUM 8.5*   GFR: Estimated Creatinine Clearance: 21.5 mL/min (by C-G formula based on SCr of  1.87 mg/dL (H)).  Liver Function Tests:  Recent Labs Lab 05/24/16 1503  AST 14*  ALT 9*  ALKPHOS 36*  BILITOT 0.3  PROT 5.5*  ALBUMIN 2.8*   No results for input(s): LIPASE, AMYLASE in the last 168 hours. No results for input(s): AMMONIA in the last 168 hours. Coagulation Profile:  Recent Labs Lab 05/24/16 1503  INR 4.16*   Cardiac Enzymes:  Recent Labs Lab 05/24/16 1503  CKTOTAL 45   BNP (last 3 results) No results for input(s): PROBNP in the last 8760 hours. HbA1C: No results for input(s): HGBA1C in the last 72 hours. CBG:  Recent  Labs Lab 05/24/16 1528 05/24/16 2101  GLUCAP 145* 124*   Lipid Profile: No results for input(s): CHOL, HDL, LDLCALC, TRIG, CHOLHDL, LDLDIRECT in the last 72 hours. Thyroid Function Tests: No results for input(s): TSH, T4TOTAL, FREET4, T3FREE, THYROIDAB in the last 72 hours. Anemia Panel:  Recent Labs  05/24/16 2057  RETICCTPCT 2.9   Sepsis Labs: Invalid input(s): PROCALCITONIN, LACTICIDVEN No results found for this or any previous visit (from the past 240 hour(s)).       Radiological Exams on Admission: Personally reviewed: Ct Head Wo Contrast  Result Date: 05/24/2016 CLINICAL DATA:  Ct head wo, hx of belly bleed/seizure , eval for bleed EXAM: CT HEAD WITHOUT CONTRAST TECHNIQUE: Contiguous axial images were obtained from the base of the skull through the vertex without intravenous contrast. COMPARISON:  None. FINDINGS: Brain: No evidence of acute infarction, hemorrhage, hydrocephalus, extra-axial collection or mass lesion/mass effect. There is mild volume loss, normal for age. Vascular: No hyperdense vessel or unexpected calcification. Skull: Normal. Negative for fracture or focal lesion. Sinuses/Orbits: Visualize globes and orbits are unremarkable. Visualized sinuses and mastoid air cells are clear. Other: None. IMPRESSION: 1. No acute findings.  No intracranial hemorrhage. Electronically Signed   By: Lajean Manes M.D.   On:  05/24/2016 18:33    EKG: Independently reviewed. Rate 87, QTc 446, LAFB old.  TW flattening in lateral leads.  Echocardiogram 2015: EF 60-65%     Assessment/Plan  1. GI bleed:  Supratherapeutic INR, melena reported this weekend.  No NSAID use, liver disease, alcohol.  No previous GI bleed.   Vitamin K 5 mg IV given in ER.  -IV PPI BID -NPO and MIVF -Consult to GI, appreciate cares -Hold warfarin     2. Symptomatic anemia:  Transfused 1 unit in ER.  Currently hemodynamically stable. -Repeat H/H now -Transfusion threshold 7 g/dL -Monitor for ongoing bleeding, repeat CBC in AM  3. Atrial fibrillation:  Chronic.  On warfarin.  CHADS2Vasc 5. -Hold warfarin given GI bleed -Continue BB  4. HTN:  -Continue amlodipine, BB, lisinopril  5. NIDDM:  -Hold Amaryl -SSI q4hrs  6. CKD IV:  At baseline.  -Avoid nephrotoxins     DVT prophylaxis: SCDs  Code Status: FULL  Family Communication: None present  Disposition Plan: Anticipate NPO after midnight and consult to GI tomorrow. Consults called: GI, Dr. Loletha Carrow Admission status: INPATIENT, med surg       Medical decision making: Patient seen at 9:47 PM on 05/24/2016.  The patient was discussed with Dr. Loletha Carrow.  What exists of the patient's chart was reviewed in depth and summarized above.  Clinical condition: stable at present.        Edwin Dada [facility] Pager [phone]

## 2016-05-24 NOTE — ED Notes (Signed)
EDP made aware of critical INR 4.16

## 2016-05-24 NOTE — ED Notes (Addendum)
EKG given to Dr. Laneta Simmers

## 2016-05-24 NOTE — ED Provider Notes (Signed)
[facility] DEPT Provider Note   CSN: [id number] Arrival date & time: 05/24/16  1337     History   Chief Complaint Chief Complaint  Patient presents with  . Seizures    HPI Patricia Randall is a 82 y.o. female.  HPI Patient is a 82 year old female past medical history significant for atrial fibrillation on Coumadin, type 2 diabetes, hypertension, hypercholesterolemia, chronic systolic heart failure who presents complaining of bilateral foot pain causing difficulty walking over the past few days. Patient reports she feels like her feet are giving out when she walks. While is triage, patient had an episode of altered mental status witnessed by nursing where she reportedly had some head bobbing and was unresponsive to verbal stimuli. This lasted less than 1 minute and patient rapidly returned to baseline. Patient reports she has had some nasal congestion and mild cough over the past week. Denies fevers, chills, chest pain, shortness of breath, abdominal pain, nausea, vomiting, diarrhea.  Past Medical History:  Diagnosis Date  . Atrial fibrillation ([place])    a. 03/2013 s/p TEE/DCCV;  b. 03/2013 Eliquis initiated.  . Chronic systolic CHF (congestive heart failure) ([place])    a. 03/2013 Echo: EF 20-25%, diff HK, mild to mod MR, sev dil LA, mild RV dysfxn, sev dil RA, mod TR.  . Claudication ([place])   . Glaucoma   . High cholesterol   . Hypertension   . Type II diabetes mellitus [facility])     Patient Active Problem List   Diagnosis Date Noted  . Symptomatic anemia 05/24/2016  . Protein-calorie malnutrition, severe 07/05/2015  . Dysphagia   . Arterial hypotension   . Emesis   . AKI (acute kidney injury) ([place]) 07/03/2015  . Essential hypertension, benign 03/13/2014  . Chronic systolic CHF (congestive heart failure) ([place]) 04/12/2013  . Acute systolic CHF (congestive heart failure), NYHA class 2 ([place]) 04/04/2013  . Hypomagnesemia 04/04/2013  . Hypokalemia 04/01/2013  . Cardiomyopathy ([place])  04/01/2013  . Ventricular tachycardia, non-sustained ([place]) 04/01/2013  . Atrial fibrillation ([place]) 03/31/2013  . Acute congestive heart failure ([place]) 03/31/2013    Past Surgical History:  Procedure Laterality Date  . CARDIOVERSION N/A 04/02/2013   Procedure: CARDIOVERSION;  Surgeon: Thayer Headings, MD;  Location: [facility];  Service: Cardiovascular;  Laterality: N/A;  Spoke with Tom   . EYE SURGERY Right    "had blood in it"   . TEE WITHOUT CARDIOVERSION N/A 04/02/2013   Procedure: TRANSESOPHAGEAL ECHOCARDIOGRAM (TEE);  Surgeon: Thayer Headings, MD;  Location: [facility];  Service: Cardiovascular;  Laterality: N/A;    OB History    No data available       Home Medications    Prior to Admission medications   Medication Sig Start Date End Date Taking? Authorizing Provider  amLODipine (NORVASC) 5 MG tablet TAKE 1 TABLET BY MOUTH EVERY DAY 03/26/16  Yes Jolaine Artist, MD  Biotin 10 MG CAPS Take 10 mg by mouth daily.   Yes Historical Provider, MD  carvedilol (COREG) 6.25 MG tablet TAKE 1 AND 1/2 TABLETS BY MOUTH TWICE A DAY WITH A MEAL 05/10/16  Yes Larey Dresser, MD  cholecalciferol (VITAMIN D) 1000 units tablet Take 1,000 Units by mouth daily.   Yes Historical Provider, MD  glimepiride (AMARYL) 1 MG tablet Take 1 mg by mouth daily before breakfast.   Yes Historical Provider, MD  latanoprost (XALATAN) 0.005 % ophthalmic solution Place 1 drop into both eyes at bedtime.   Yes Historical Provider, MD  lisinopril (  PRINIVIL,ZESTRIL) 20 MG tablet Take 20 mg by mouth daily.   Yes Historical Provider, MD  lovastatin (MEVACOR) 40 MG tablet Take 40 mg by mouth at bedtime.   Yes Historical Provider, MD  warfarin (COUMADIN) 2 MG tablet Take 2-4 mg by mouth daily. 4 mg everyday. Except on "Sunday patient takes 2 mg   Yes Historical Provider, MD  traMADol (ULTRAM) 50 MG tablet Take 1 tablet (50 mg total) by mouth every 6 (six) hours as needed. Patient not taking: Reported on 05/24/2016  05/30/14   Lauren Parker, PA-C  traMADol (ULTRAM) 50 MG tablet Take 1 tablet (50 mg total) by mouth every 6 (six) hours as needed. Patient not taking: Reported on 05/24/2016 07/05/15   Alyssa A Haney, MD    Family History Family History  Problem Relation Age of Onset  . Other Mother     died in her 70's - 'just got sick.'  . Other Father     died @ 84, unknown cause.  . Diabetes Brother   . Other      12"  siblings + 7 more 1/2 siblings.    Social History Social History  Substance Use Topics  . Smoking status: Never Smoker  . Smokeless tobacco: Never Used  . Alcohol use No     Allergies   Patient has no known allergies.   Review of Systems Review of Systems   Physical Exam Updated Vital Signs BP 107/62   Pulse 90   Temp 99.4 F (37.4 C)   Resp 17   Wt 71.2 kg   SpO2 100%   BMI 28.72 kg/m   Physical Exam  Constitutional: She is oriented to person, place, and time. She appears well-developed and well-nourished.  Poor hygiene  HENT:  Head: Normocephalic and atraumatic.  Nasal congestion  Eyes: Conjunctivae are normal.  Neck: Neck supple.  Cardiovascular: Normal rate and regular rhythm.   No murmur heard. Pulmonary/Chest: Effort normal and breath sounds normal. No respiratory distress. She has no wheezes. She has no rales.  Abdominal: Soft. There is no tenderness.  Genitourinary:  Genitourinary Comments: Gross melena on digital rectal exam  Musculoskeletal: She exhibits no edema.  Neurological: She is alert and oriented to person, place, and time. She has normal strength. She displays normal reflexes. No cranial nerve deficit or sensory deficit. She exhibits normal muscle tone. Coordination normal. GCS eye subscore is 4. GCS verbal subscore is 5. GCS motor subscore is 6.  Skin: Skin is warm and dry.  Psychiatric: She has a normal mood and affect.  Nursing note and vitals reviewed.    ED Treatments / Results  Labs (all labs ordered are listed, but only  abnormal results are displayed) Labs Reviewed  CBC WITH DIFFERENTIAL/PLATELET - Abnormal; Notable for the following:       Result Value   RBC 2.45 (*)    Hemoglobin 6.5 (*)    HCT 20.3 (*)    RDW 15.9 (*)    All other components within normal limits  COMPREHENSIVE METABOLIC PANEL - Abnormal; Notable for the following:    Glucose, Bld 156 (*)    BUN 63 (*)    Creatinine, Ser 1.87 (*)    Calcium 8.5 (*)    Total Protein 5.5 (*)    Albumin 2.8 (*)    AST 14 (*)    ALT 9 (*)    Alkaline Phosphatase 36 (*)    GFR calc non Af Amer 23 (*)    GFR calc Af  Amer 27 (*)    All other components within normal limits  PROTIME-INR - Abnormal; Notable for the following:    Prothrombin Time 41.6 (*)    INR 4.16 (*)    All other components within normal limits  CBG MONITORING, ED - Abnormal; Notable for the following:    Glucose-Capillary 145 (*)    All other components within normal limits  CK  VITAMIN B12  FOLATE  IRON AND TIBC  FERRITIN  RETICULOCYTES  I-STAT TROPOININ, ED  TYPE AND SCREEN  PREPARE RBC (CROSSMATCH)  ABO/RH    EKG  EKG Interpretation  Date/Time:  Monday May 24 2016 15:31:20 EST Ventricular Rate:  87 PR Interval:    QRS Duration: 101 QT Interval:  370 QTC Calculation: 446 R Axis:   -65 Text Interpretation:  Sinus rhythm Left anterior fascicular block Abnormal R-wave progression, late transition Left ventricular hypertrophy Borderline T abnormalities, lateral leads New since previous tracing Confirmed by KNOTT MD, DANIEL [phone]) on 05/24/2016 3:56:37 PM       Radiology Ct Head Wo Contrast  Result Date: 05/24/2016 CLINICAL DATA:  Ct head wo, hx of belly bleed/seizure , eval for bleed EXAM: CT HEAD WITHOUT CONTRAST TECHNIQUE: Contiguous axial images were obtained from the base of the skull through the vertex without intravenous contrast. COMPARISON:  None. FINDINGS: Brain: No evidence of acute infarction, hemorrhage, hydrocephalus, extra-axial collection or  mass lesion/mass effect. There is mild volume loss, normal for age. Vascular: No hyperdense vessel or unexpected calcification. Skull: Normal. Negative for fracture or focal lesion. Sinuses/Orbits: Visualize globes and orbits are unremarkable. Visualized sinuses and mastoid air cells are clear. Other: None. IMPRESSION: 1. No acute findings.  No intracranial hemorrhage. Electronically Signed   By: Lajean Manes M.D.   On: 05/24/2016 18:33    Procedures Procedures (including critical care time)  Medications Ordered in ED Medications  phytonadione (VITAMIN K) 5 mg in dextrose 5 % 50 mL IVPB (5 mg Intravenous New Bag/Given 05/24/16 1818)  0.9 %  sodium chloride infusion (0 mL/hr Intravenous Stopped 05/24/16 1755)  acetaminophen (TYLENOL) tablet 1,000 mg (1,000 mg Oral Given 05/24/16 1825)     Initial Impression / Assessment and Plan / ED Course  I have reviewed the triage vital signs and the nursing notes.  Pertinent labs & imaging results that were available during my care of the patient were reviewed by me and considered in my medical decision making (see chart for details).  Clinical Course    Patient is a 82 year old female past medical history as above who presents with multiple vague complaints including bilateral foot pain and difficulty walking along with possible syncopal episodes. On presentation patient is afebrile, normotensive without tachycardia or hypoxia. No focal neuro deficits identified. Patient had rapid return to baseline after her episodes of altered mental status doubt seizure activity. More likely atypical syncope. Initial blood glucose 145. EKG, CBC, CMP, CK, coags ordered.  EKG shows some nonspecific T-wave changes in lateral leads but otherwise no acute changes.  Labs significant for anemia with hemoglobin 6.5. Likely source of patients syncope and other symptoms. On reevaluation, patient admits to having some dark stools over the past week. Digital rectal exam performed  and reveals melena. Hemoccult obtained and type and screen sent. Remains HDS. Transfused 1 unit pRBCs. INR supratherapeutic, IV Vitamin K given.   Hospitalist consulted for admission and triage of patient for further management of symptomatic anemia. CT head ordered at request of admitting team to r/o bleed.  Patient  care supervised by Dr. Laneta Simmers, ED attending   Final Clinical Impressions(s) / ED Diagnoses   Final diagnoses:  Syncope, unspecified syncope type  Anemia, unspecified type    New Prescriptions New Prescriptions   No medications on file     Gibson Ramp, MD 05/24/16 1905    Leo Grosser, MD 05/24/16 1956

## 2016-05-24 NOTE — ED Triage Notes (Signed)
Pt. Coming from home via POV for seizure-like activity. Pt. Had another episode in triage with titubation/shaking of head. Pt. At baseline upon arrival to hallway bed. Pt. sts 'I feel better now that i'm back here next to a room'.

## 2016-05-25 DIAGNOSIS — D649 Anemia, unspecified: Secondary | ICD-10-CM

## 2016-05-25 DIAGNOSIS — Z7901 Long term (current) use of anticoagulants: Secondary | ICD-10-CM

## 2016-05-25 DIAGNOSIS — I482 Chronic atrial fibrillation: Secondary | ICD-10-CM

## 2016-05-25 DIAGNOSIS — K921 Melena: Principal | ICD-10-CM

## 2016-05-25 LAB — CBC
HCT: 23 % — ABNORMAL LOW (ref 36.0–46.0)
HCT: 21.8 % — ABNORMAL LOW (ref 36.0–46.0)
HEMOGLOBIN: 7.4 g/dL — AB (ref 12.0–15.0)
Hemoglobin: 7.6 g/dL — ABNORMAL LOW (ref 12.0–15.0)
MCH: 27.4 pg (ref 26.0–34.0)
MCH: 27.9 pg (ref 26.0–34.0)
MCHC: 33 g/dL (ref 30.0–36.0)
MCHC: 33.9 g/dL (ref 30.0–36.0)
MCV: 83 fL (ref 78.0–100.0)
MCV: 82.3 fL (ref 78.0–100.0)
PLATELETS: 189 10*3/uL (ref 150–400)
Platelets: 170 10*3/uL (ref 150–400)
RBC: 2.77 MIL/uL — ABNORMAL LOW (ref 3.87–5.11)
RBC: 2.65 MIL/uL — ABNORMAL LOW (ref 3.87–5.11)
RDW: 15.1 % (ref 11.5–15.5)
RDW: 15 % (ref 11.5–15.5)
WBC: 6.3 10*3/uL (ref 4.0–10.5)
WBC: 5.9 10*3/uL (ref 4.0–10.5)

## 2016-05-25 LAB — BASIC METABOLIC PANEL
Anion gap: 3 — ABNORMAL LOW (ref 5–15)
BUN: 60 mg/dL — ABNORMAL HIGH (ref 6–20)
CALCIUM: 7.9 mg/dL — AB (ref 8.9–10.3)
CHLORIDE: 113 mmol/L — AB (ref 101–111)
CO2: 26 mmol/L (ref 22–32)
CREATININE: 1.71 mg/dL — AB (ref 0.44–1.00)
GFR calc non Af Amer: 26 mL/min — ABNORMAL LOW (ref >60)
GFR, EST AFRICAN AMERICAN: 30 mL/min — AB (ref >60)
Glucose, Bld: 144 mg/dL — ABNORMAL HIGH (ref 65–99)
Potassium: 3.8 mmol/L (ref 3.5–5.1)
SODIUM: 142 mmol/L (ref 135–145)

## 2016-05-25 LAB — PREPARE RBC (CROSSMATCH)

## 2016-05-25 LAB — GLUCOSE, CAPILLARY
GLUCOSE-CAPILLARY: 91 mg/dL (ref 65–99)
GLUCOSE-CAPILLARY: 145 mg/dL — AB (ref 65–99)
GLUCOSE-CAPILLARY: 119 mg/dL — AB (ref 65–99)
GLUCOSE-CAPILLARY: 120 mg/dL — AB (ref 65–99)
Glucose-Capillary: 132 mg/dL — ABNORMAL HIGH (ref 65–99)
Glucose-Capillary: 96 mg/dL (ref 65–99)

## 2016-05-25 MED ORDER — SODIUM CHLORIDE 0.9 % IV SOLN
Freq: Once | INTRAVENOUS | Status: AC
  Administered 2016-05-25: 06:00:00 via INTRAVENOUS

## 2016-05-25 MED ORDER — DEXTROSE 5 % IV SOLN
5.0000 mg | Freq: Once | INTRAVENOUS | Status: AC
  Administered 2016-05-25: 5 mg via INTRAVENOUS
  Filled 2016-05-25: qty 0.5

## 2016-05-25 MED ORDER — SODIUM CHLORIDE 0.9 % IV SOLN: INTRAVENOUS | Status: DC

## 2016-05-25 MED ORDER — PANTOPRAZOLE SODIUM 40 MG PO TBEC
40.0000 mg | DELAYED_RELEASE_TABLET | Freq: Two times a day (BID) | ORAL | Status: DC
  Administered 2016-05-25 – 2016-05-27 (×5): 40 mg via ORAL
  Filled 2016-05-25 (×5): qty 1

## 2016-05-25 NOTE — Consult Note (Signed)
[facility] Consult: 8:10 AM 05/25/2016  LOS: 1 day    Referring Provider: Dr Verlon Au  Primary Care Physician:  Leonard Downing, MD Primary Gastroenterologist:  Dr. Loletha Carrow     Reason for Consultation:  anemia   HPI: Patricia Randall is a 82 y.o. female.  Lives in [place], [place].  [place] a fib on Coumadin.  2014 TEE/DCCV.  CKD stage 4.  CHF, EF 20 to 25%.  DM 2 on oral agents.  Patient has never undergone colonoscopy or any endoscopic procedures.   Admitted last night with symptomatic anemia in setting of supratherapeutic INR.  Had felt weakness at home for a couple of days but it got most severe on 12/2.  She had no chest pain or sense of palpitations.  In ED developed 1 minute of AMS, syncope.  Head CT and cardiac enzymes unrevealing. Hgb 6.5, MCV 82, to 7.6 after PRBC x 1, 2nd unit transfusing.  Hgb in 06/2015: 11.1 to 9.3.  Ferritin is 20 but iron/TIBC normal and iron sat elevated at 57.  Coags 41/4.1.   "Gross melena" on rectal exam per ED resident physician's rectal exam. She's been started on twice daily IV Protonix. She received a single dose of 5 mg IV vitamin K. We did not have a follow-up INR today.  Patient has noticed isolated incidences of looser than normal, black, but not grossly bloody stool.  The last time she noticed this was on 12/2 and previously it had occurred 3 weeks or more ago.  She hasn't had any abdominal pain. Generally over several weeks, perhaps several months, her appetite has diminished. There is no nausea. She's had infrequent episodes of choking while eating and drinking.  Her weight is stable.    Past Medical History:  Diagnosis Date  . Atrial fibrillation ([place])    a. 03/2013 s/p TEE/DCCV;  b. 03/2013 Eliquis initiated.  . Chronic systolic CHF (congestive heart failure) ([place])    a.  03/2013 Echo: EF 20-25%, diff HK, mild to mod MR, sev dil LA, mild RV dysfxn, sev dil RA, mod TR.  . Claudication ([place])   . Glaucoma   . High cholesterol   . Hypertension   . Type II diabetes mellitus ([place])     Past Surgical History:  Procedure Laterality Date  . CARDIOVERSION N/A 04/02/2013   Procedure: CARDIOVERSION;  Surgeon: Thayer Headings, MD;  Location: [facility];  Service: Cardiovascular;  Laterality: N/A;  Spoke with Tom   . EYE SURGERY Right    "had blood in it"   . TEE WITHOUT CARDIOVERSION N/A 04/02/2013   Procedure: TRANSESOPHAGEAL ECHOCARDIOGRAM (TEE);  Surgeon: Thayer Headings, MD;  Location: [facility];  Service: Cardiovascular;  Laterality: N/A;    Prior to Admission medications   Medication Sig Start Date End Date Taking? Authorizing Provider  amLODipine (NORVASC) 5 MG tablet TAKE 1 TABLET BY MOUTH EVERY DAY 03/26/16  Yes Jolaine Artist, MD  Biotin 10 MG CAPS Take 10 mg by mouth daily.   Yes Historical Provider, MD  carvedilol (COREG) 6.25 MG tablet  TAKE 1 AND 1/2 TABLETS BY MOUTH TWICE A DAY WITH A MEAL 05/10/16  Yes Larey Dresser, MD  cholecalciferol (VITAMIN D) 1000 units tablet Take 1,000 Units by mouth daily.   Yes Historical Provider, MD  glimepiride (AMARYL) 1 MG tablet Take 1 mg by mouth daily before breakfast.   Yes Historical Provider, MD  latanoprost (XALATAN) 0.005 % ophthalmic solution Place 1 drop into both eyes at bedtime.   Yes Historical Provider, MD  lisinopril (PRINIVIL,ZESTRIL) 20 MG tablet Take 20 mg by mouth daily.   Yes Historical Provider, MD  lovastatin (MEVACOR) 40 MG tablet Take 40 mg by mouth at bedtime.   Yes Historical Provider, MD  warfarin (COUMADIN) 2 MG tablet Take 2-4 mg by mouth daily. 4 mg everyday. Except on "Sunday patient takes 2 mg   Yes Historical Provider, MD  traMADol (ULTRAM) 50 MG tablet Take 1 tablet (50 mg total) by mouth every 6 (six) hours as needed. Patient not taking: Reported on 05/24/2016 05/30/14   Lauren  Parker, PA-C  traMADol (ULTRAM) 50 MG tablet Take 1 tablet (50 mg total) by mouth every 6 (six) hours as needed. Patient not taking: Reported on 05/24/2016 07/05/15   Alyssa A Haney, MD    Scheduled Meds: . amLODipine  5 mg Oral Daily  . carvedilol  9.375 mg Oral BID WC  . insulin aspart  0-9 Units Subcutaneous Q4H  . latanoprost  1 drop Both Eyes QHS  . lisinopril  20 mg Oral Daily  . pantoprazole  40 mg Intravenous Q12H  . pravastatin  40 mg Oral q1800   Infusions: . sodium chloride 125 mL/hr at 05/24/16 2218   PRN Meds: acetaminophen **OR** acetaminophen, ondansetron **OR** ondansetron (ZOFRAN) IV   Allergies as of 05/24/2016  . (No Known Allergies)    Family History  Problem Relation Age of Onset  . Other Mother     died in her 70's - 'just got sick.'  . Other Father     died @ 84, unknown cause.  . Diabetes Brother   . Other      12"  siblings + 7 more 1/2 siblings.    Social History   Social History  . Marital status: Widowed    Spouse name: N/A  . Number of children: N/A  . Years of education: N/A   Occupational History  . Not on file.   Social History Main Topics  . Smoking status: Never Smoker  . Smokeless tobacco: Never Used  . Alcohol use No  . Drug use: No  . Sexual activity: No   Other Topics Concern  . Not on file   Social History Narrative   Lives in [place] with her son.    REVIEW OF SYSTEMS: Constitutional:  Generally, for her age she is active. She is able to sleep her porch without limitations. However in the day or 2 prior to coming to the emergency room she did have the weakness. ENT:  + nasal congestion. No nose bleeds Pulm:  + cough and congestion CV:  No palpitations, no LE edema.  GU:  No hematuria, no frequency GI:  Per HPI Heme:  Never previously had issues with anemia or need for iron supplementation. She does not have excessive bleeding or bruising.   Transfusions:  She had never had transfusions before last night. Neuro:   No headaches, no peripheral tingling or numbness.  No blurry vision Derm:  No itching, no rash or sores.  Endocrine:  No sweats or  chills.  No polyuria or dysuria Immunization:  Did not inquire as to recent immunizations. Travel:  None beyond local counties in last few months.    PHYSICAL EXAM: Vital signs in last 24 hours: Vitals:   05/25/16 0635 05/25/16 0650  BP: (!) 121/53 (!) 125/52  Pulse: 76 67  Resp: 18 18  Temp: 98.2 F (36.8 C) 97.9 F (36.6 C)   Wt Readings from Last 3 Encounters:  05/25/16 69.5 kg (153 lb 3.2 oz)  07/04/15 71.3 kg (157 lb 3 oz)  05/30/14 68 kg (150 lb)    General: Pleasant, overall well appearing, alert and engaged elderly AAF. Head:  No asymmetry or swelling.  Eyes:  Conjunctiva is pale. No scleral icterus. Ears:  Not obviously hard of hearing.  Nose:  No congestion or discharge. Mouth:  Few remaining teeth. Mucosa is moist and clear. Tongue is midline. Neck:  No JVD, no thyromegaly, no masses. Lungs:  Clear bilaterally. No cough, no labored breathing. Heart: RRR. No MRG. S1, S2 present. Sinus rhythm on telemetry monitor. Abdomen:  Soft. Not tender or distended. No HSM or masses..   Rectal: Grossly melenic stool in the ED. Patient declined my offer to repeat the rectal exam   Musc/Skeltl: No obvious joint deformities, swelling or redness. Extremities:  No CCE.  Neurologic:  No tremor. No limb weakness. Moves all 4 limbs easily. Grossly neurologically unremarkable. Skin:  No rashes or sores. Nodes:  No cervical adenopathy   Psych:  Pleasant, calm, cooperative.  Intake/Output from previous day: 12/04 0701 - 12/05 0700 In: 1091.5 [I.V.:752.5; Blood:289; IV Piggyback:50] Out: 800 [Urine:800] Intake/Output this shift: No intake/output data recorded.  LAB RESULTS:  Recent Labs  05/24/16 1503 05/24/16 2057  WBC 7.2 6.3  HGB 6.5* 7.6*  HCT 20.3* 23.0*  PLT 201 189   BMET Lab Results  Component Value Date   NA 142 05/24/2016   NA  145 07/05/2015   NA 141 07/04/2015   K 3.9 05/24/2016   K 3.9 07/05/2015   K 3.7 07/04/2015   CL 109 05/24/2016   CL 113 (H) 07/05/2015   CL 110 07/04/2015   CO2 25 05/24/2016   CO2 21 (L) 07/05/2015   CO2 24 07/04/2015   GLUCOSE 156 (H) 05/24/2016   GLUCOSE 134 (H) 07/05/2015   GLUCOSE 121 (H) 07/04/2015   BUN 63 (H) 05/24/2016   BUN 34 (H) 07/05/2015   BUN 58 (H) 07/04/2015   CREATININE 1.87 (H) 05/24/2016   CREATININE 1.65 (H) 07/05/2015   CREATININE 3.50 (H) 07/04/2015   CALCIUM 8.5 (L) 05/24/2016   CALCIUM 8.3 (L) 07/05/2015   CALCIUM 7.8 (L) 07/04/2015   LFT  Recent Labs  05/24/16 1503  PROT 5.5*  ALBUMIN 2.8*  AST 14*  ALT 9*  ALKPHOS 36*  BILITOT 0.3   PT/INR Lab Results  Component Value Date   INR 4.16 (HH) 05/24/2016   INR 3.44 (H) 07/05/2015   INR 4.35 (H) 07/04/2015   Hepatitis Panel No results for input(s): HEPBSAG, HCVAB, HEPAIGM, HEPBIGM in the last 72 hours. C-Diff No components found for: CDIFF Lipase     Component Value Date/Time   LIPASE 37 07/03/2015 1425    Drugs of Abuse  No results found for: LABOPIA, COCAINSCRNUR, LABBENZ, AMPHETMU, THCU, LABBARB   RADIOLOGY STUDIES: Ct Head Wo Contrast  Result Date: 05/24/2016 CLINICAL DATA:  Ct head wo, hx of belly bleed/seizure , eval for bleed EXAM: CT HEAD WITHOUT CONTRAST TECHNIQUE: Contiguous axial images were obtained  from the base of the skull through the vertex without intravenous contrast. COMPARISON:  None. FINDINGS: Brain: No evidence of acute infarction, hemorrhage, hydrocephalus, extra-axial collection or mass lesion/mass effect. There is mild volume loss, normal for age. Vascular: No hyperdense vessel or unexpected calcification. Skull: Normal. Negative for fracture or focal lesion. Sinuses/Orbits: Visualize globes and orbits are unremarkable. Visualized sinuses and mastoid air cells are clear. Other: None. IMPRESSION: 1. No acute findings.  No intracranial hemorrhage. Electronically  Signed   By: Lajean Manes M.D.   On: 05/24/2016 18:33    IMPRESSION:   *  Normocytic anemia.  Symptomatic with syncopal spell and melena.  *  Chronic Coumadin for pt with [place] Vasc Score 4: CHF, hx a fib converted to SR after DCCV  *  CKD stage 4.  Current BUN/creat actually better than in 06/2015.     PLAN:     *  Plan EGD tomorrow. We will switch her to oral Protonix and allow her to have full liquids for the remainder of the day. Check INR in the morning. It should be near 1.8 in order to proceed with upper endoscopy. Of note patient is not very interested in pursuing a colonoscopy.   Azucena Freed  05/25/2016, 8:10 AM Pager: [phone]     Attending physician's note   I have taken a history, examined the patient and reviewed the chart. I agree with the Advanced Practitioner's note, impression and recommendations. Melena and ABL anemia insetting of supratherpuetic Coumadin anticoagulation. Afib and CHF with EF=20-25%. Hold Coumadin and reverse anticoagulation. EGD tentatively planned for tomorrow presuming INR < 2.0, ideally INR <1.7. PPI bid for now. Transfusion to keep Hb > 8.   Lucio Edward, MD [facility] [phone] Mon-Fri 8a-5p [phone] after 5p, weekends, holidays

## 2016-05-25 NOTE — Progress Notes (Addendum)
Second unit of blood started at 6.35am, during the first 15 min no any side effects noted, Vitals stable so far, Oxygen sat is maintained @100 % room air, will continue to monitor  Palma Holter, RN

## 2016-05-25 NOTE — Progress Notes (Addendum)
New pt admission from ED. Pt brought to the floor in stable condition. Vitals taken. Initial Assessment done. All immediate pertinent needs to patient addressed. Patient Guide given to patient. Important safety instructions relating to hospitalization reviewed with patient. Patient verbalized understanding. Pt is in NPO at this point for possible GI consult and EGD tomorrow am, IV drip going @125cc /hr . Will continue to monitor pt.  Palma Holter, RN

## 2016-05-25 NOTE — Progress Notes (Signed)
Patricia Randall BHA:[id number] DOB: 06-13-1929 DOA: 05/24/2016 PCP: Leonard Downing, MD  Brief narrative:  82 y/o ? P Afib, chad2vasc2=4-5 on coumadin  Sp tee/dccv 03/2013 HFprEF echo 09/2013 htn Dm ty ii + Nephropahty CKD ii-iii  Admitted with dark tarry bm 05/24/16   Past medical history-As per Problem list Chart reviewed as below-   Consultants:  gi  Procedures:    Antibiotics:     Subjective   alert pleasant in nad Felt "woobly" after church and thought needed to get checked out   Objective    Interim History:   Telemetry: nsr   Objective: Vitals:   05/25/16 0635 05/25/16 0650 05/25/16 0823 05/25/16 0915  BP: (!) 121/53 (!) 125/52 (!) 122/54 (!) 90/48  Pulse: 76 67 65 60  Resp: 18 18 18 18   Temp: 98.2 F (36.8 C) 97.9 F (36.6 C) 97.9 F (36.6 C) 98 F (36.7 C)  TempSrc: Oral Oral Oral Oral  SpO2: 100% 100% 100% 100%  Weight:      Height:        Intake/Output Summary (Last 24 hours) at 05/25/16 1248 Last data filed at 05/25/16 0915  Gross per 24 hour  Intake             1484 ml  Output              800 ml  Net              684 ml    Exam:  General: eomi ncat wig Cardiovascular: s1 s 2no m/r/g Respiratory: clear no added sound Abdomen:  Soft nt nd no reboudn Skin trace le edema Neuro intaqct  Data Reviewed: Basic Metabolic Panel:  Recent Labs Lab 05/24/16 1503  NA 142  K 3.9  CL 109  CO2 25  GLUCOSE 156*  BUN 63*  CREATININE 1.87*  CALCIUM 8.5*   Liver Function Tests:  Recent Labs Lab 05/24/16 1503  AST 14*  ALT 9*  ALKPHOS 36*  BILITOT 0.3  PROT 5.5*  ALBUMIN 2.8*   No results for input(s): LIPASE, AMYLASE in the last 168 hours. No results for input(s): AMMONIA in the last 168 hours. CBC:  Recent Labs Lab 05/24/16 1503 05/24/16 2057  WBC 7.2 6.3  NEUTROABS 5.7  --   HGB 6.5* 7.6*  HCT 20.3* 23.0*  MCV 82.9 83.0  PLT 201 189   Cardiac Enzymes:  Recent Labs Lab 05/24/16 1503  CKTOTAL 45    BNP: Invalid input(s): POCBNP CBG:  Recent Labs Lab 05/24/16 2101 05/24/16 2342 05/25/16 0405 05/25/16 0825 05/25/16 1132  GLUCAP 124* 106* 91 132* 145*    No results found for this or any previous visit (from the past 240 hour(s)).   Studies:              All Imaging reviewed and is as per above notation   Scheduled Meds: . amLODipine  5 mg Oral Daily  . carvedilol  9.375 mg Oral BID WC  . insulin aspart  0-9 Units Subcutaneous Q4H  . latanoprost  1 drop Both Eyes QHS  . lisinopril  20 mg Oral Daily  . pantoprazole  40 mg Oral BID  . pravastatin  40 mg Oral q1800   Continuous Infusions: . sodium chloride 125 mL/hr at 05/24/16 2218  . sodium chloride       Assessment/Plan:  1. ? UGIB-clear liquids, Endoscope am?  Vit K given x 2.  Rpt INr this pm.  Cont Protonix 40 po  bid 2. Anemia of acute gi blood loss-given 2 U PRBC-reversing coumadin.  Hb 6.5-->7.4 3. Afib CHad2Vasc2 score=5.  Hold coumadin. Cont coreg 9.375 as is in NSR can d/c tele in am if all stable 4. Htn-cont amlodipine 5 daily, lisinopril 20 qd 5. hld-hold statin pravachol 40 for now 6. DM ty ii with nephropathy-Bun elevated disprop raising concern for UGIB.  Stable.  Sugars 91-145  Inpatient No family full code  Verneita Griffes, MD  [facility] Pager [phone] 05/25/2016, 12:48 PM    LOS: 1 day

## 2016-05-26 ENCOUNTER — Inpatient Hospital Stay (HOSPITAL_COMMUNITY): Payer: Medicare Other | Admitting: Anesthesiology

## 2016-05-26 ENCOUNTER — Encounter (HOSPITAL_COMMUNITY)
Admission: EM | Disposition: A | Discharge status: Home / Self Care | Payer: Self-pay | Source: Home / Self Care | Attending: Family Medicine

## 2016-05-26 ENCOUNTER — Encounter (HOSPITAL_COMMUNITY): Payer: Self-pay | Admitting: *Deleted

## 2016-05-26 HISTORY — PX: ESOPHAGOGASTRODUODENOSCOPY: SHX5428

## 2016-05-26 LAB — CBC
HCT: 19.4 % — ABNORMAL LOW (ref 36.0–46.0)
HEMOGLOBIN: 6.5 g/dL — AB (ref 12.0–15.0)
MCH: 27.9 pg (ref 26.0–34.0)
MCHC: 33.5 g/dL (ref 30.0–36.0)
MCV: 83.3 fL (ref 78.0–100.0)
Platelets: 156 10*3/uL (ref 150–400)
RBC: 2.33 MIL/uL — AB (ref 3.87–5.11)
RDW: 15.5 % (ref 11.5–15.5)
WBC: 5.5 10*3/uL (ref 4.0–10.5)

## 2016-05-26 LAB — CBC WITH DIFFERENTIAL/PLATELET
BASOS ABS: 0 10*3/uL (ref 0.0–0.1)
Basophils Relative: 0 %
EOS ABS: 0.1 10*3/uL (ref 0.0–0.7)
Eosinophils Relative: 2 %
HCT: 23.9 % — ABNORMAL LOW (ref 36.0–46.0)
HEMOGLOBIN: 8.1 g/dL — AB (ref 12.0–15.0)
LYMPHS ABS: 1.8 10*3/uL (ref 0.7–4.0)
LYMPHS PCT: 29 %
MCH: 28.5 pg (ref 26.0–34.0)
MCHC: 33.9 g/dL (ref 30.0–36.0)
MCV: 84.2 fL (ref 78.0–100.0)
Monocytes Absolute: 0.4 10*3/uL (ref 0.1–1.0)
Monocytes Relative: 7 %
NEUTROS PCT: 62 %
Neutro Abs: 3.9 10*3/uL (ref 1.7–7.7)
Platelets: 162 10*3/uL (ref 150–400)
RBC: 2.84 MIL/uL — AB (ref 3.87–5.11)
RDW: 15.1 % (ref 11.5–15.5)
WBC: 6.3 10*3/uL (ref 4.0–10.5)

## 2016-05-26 LAB — GLUCOSE, CAPILLARY
GLUCOSE-CAPILLARY: 92 mg/dL (ref 65–99)
GLUCOSE-CAPILLARY: 117 mg/dL — AB (ref 65–99)
GLUCOSE-CAPILLARY: 91 mg/dL (ref 65–99)
Glucose-Capillary: 111 mg/dL — ABNORMAL HIGH (ref 65–99)
Glucose-Capillary: 122 mg/dL — ABNORMAL HIGH (ref 65–99)

## 2016-05-26 LAB — HEMOGLOBIN A1C
Hgb A1c MFr Bld: 6.2 % — ABNORMAL HIGH (ref 4.8–5.6)
MEAN PLASMA GLUCOSE: 131 mg/dL

## 2016-05-26 LAB — PROTIME-INR
INR: 1.38
PROTHROMBIN TIME: 17.1 s — AB (ref 11.4–15.2)

## 2016-05-26 LAB — PREPARE RBC (CROSSMATCH)

## 2016-05-26 SURGERY — EGD (ESOPHAGOGASTRODUODENOSCOPY)
Anesthesia: Monitor Anesthesia Care

## 2016-05-26 MED ORDER — SODIUM CHLORIDE 0.9 % IV SOLN: Freq: Once | INTRAVENOUS | Status: DC

## 2016-05-26 MED ORDER — LACTATED RINGERS IV SOLN
INTRAVENOUS | Status: DC
  Administered 2016-05-26 (×2): via INTRAVENOUS

## 2016-05-26 MED ORDER — PROPOFOL 500 MG/50ML IV EMUL
INTRAVENOUS | Status: DC | PRN
  Administered 2016-05-26: 100 ug/kg/min via INTRAVENOUS

## 2016-05-26 NOTE — Progress Notes (Addendum)
Blood bank just called at 6.52am regarding blood is ready to be transfused, report will be provided to the next shift.  Palma Holter, RN

## 2016-05-26 NOTE — Progress Notes (Signed)
Pt's Hb this am is 6.5 (critical lab value received from Lab) Informed to MD At this point pt is NPO for EGD this morning, slept well overnight, IV fluid running @125  Will continue to monitor  Palma Holter, RN

## 2016-05-26 NOTE — Anesthesia Preprocedure Evaluation (Signed)
Anesthesia Evaluation  Patient identified by MRN, date of birth, ID band Patient awake    Reviewed: Allergy & Precautions, H&P , NPO status , Patient's Chart, lab work & pertinent test results  Airway Mallampati: II   Neck ROM: full    Dental   Pulmonary neg pulmonary ROS,    breath sounds clear to auscultation       Cardiovascular hypertension, +CHF  + dysrhythmias Atrial Fibrillation  Rhythm:regular Rate:Normal     Neuro/Psych    GI/Hepatic   Endo/Other  diabetes, Type 2  Renal/GU Renal InsufficiencyRenal disease     Musculoskeletal   Abdominal   Peds  Hematology  (+) anemia ,   Anesthesia Other Findings   Reproductive/Obstetrics                             Anesthesia Physical Anesthesia Plan  ASA: III  Anesthesia Plan: MAC   Post-op Pain Management:    Induction: Intravenous  Airway Management Planned: Nasal Cannula  Additional Equipment:   Intra-op Plan:   Post-operative Plan:   Informed Consent: I have reviewed the patients History and Physical, chart, labs and discussed the procedure including the risks, benefits and alternatives for the proposed anesthesia with the patient or authorized representative who has indicated his/her understanding and acceptance.     Plan Discussed with: CRNA, Anesthesiologist and Surgeon  Anesthesia Plan Comments:         Anesthesia Quick Evaluation

## 2016-05-26 NOTE — Interval H&P Note (Signed)
History and Physical Interval Note:  05/26/2016 9:42 AM  Patricia Randall  has presented today for surgery, with the diagnosis of Anemia, GI bleed.  The various methods of treatment have been discussed with the patient and family. After consideration of risks, benefits and other options for treatment, the patient has consented to  Procedure(s): ESOPHAGOGASTRODUODENOSCOPY (EGD) (N/A) as a surgical intervention .  The patient's history has been reviewed, patient examined, no change in status, stable for surgery.  I have reviewed the patient's chart and labs.  Questions were answered to the patient's satisfaction.     Pricilla Riffle. [place] Plan

## 2016-05-26 NOTE — H&P (View-Only) (Signed)
[facility] Consult: 8:10 AM 05/25/2016  LOS: 1 day    Referring Provider: Dr Verlon Au  Primary Care Physician:  Leonard Downing, MD Primary Gastroenterologist:  Dr. Loletha Carrow     Reason for Consultation:  anemia   HPI: Patricia Randall is a 82 y.o. female.  Lives in [place], [place].  [place] a fib on Coumadin.  2014 TEE/DCCV.  CKD stage 4.  CHF, EF 20 to 25%.  DM 2 on oral agents.  Patient has never undergone colonoscopy or any endoscopic procedures.   Admitted last night with symptomatic anemia in setting of supratherapeutic INR.  Had felt weakness at home for a couple of days but it got most severe on 12/2.  She had no chest pain or sense of palpitations.  In ED developed 1 minute of AMS, syncope.  Head CT and cardiac enzymes unrevealing. Hgb 6.5, MCV 82, to 7.6 after PRBC x 1, 2nd unit transfusing.  Hgb in 06/2015: 11.1 to 9.3.  Ferritin is 20 but iron/TIBC normal and iron sat elevated at 57.  Coags 41/4.1.   "Gross melena" on rectal exam per ED resident physician's rectal exam. She's been started on twice daily IV Protonix. She received a single dose of 5 mg IV vitamin K. We did not have a follow-up INR today.  Patient has noticed isolated incidences of looser than normal, black, but not grossly bloody stool.  The last time she noticed this was on 12/2 and previously it had occurred 3 weeks or more ago.  She hasn't had any abdominal pain. Generally over several weeks, perhaps several months, her appetite has diminished. There is no nausea. She's had infrequent episodes of choking while eating and drinking.  Her weight is stable.    Past Medical History:  Diagnosis Date  . Atrial fibrillation ([place])    a. 03/2013 s/p TEE/DCCV;  b. 03/2013 Eliquis initiated.  . Chronic systolic CHF (congestive heart failure) ([place])    a.  03/2013 Echo: EF 20-25%, diff HK, mild to mod MR, sev dil LA, mild RV dysfxn, sev dil RA, mod TR.  . Claudication ([place])   . Glaucoma   . High cholesterol   . Hypertension   . Type II diabetes mellitus ([place])     Past Surgical History:  Procedure Laterality Date  . CARDIOVERSION N/A 04/02/2013   Procedure: CARDIOVERSION;  Surgeon: Thayer Headings, MD;  Location: [facility];  Service: Cardiovascular;  Laterality: N/A;  Spoke with Tom   . EYE SURGERY Right    "had blood in it"   . TEE WITHOUT CARDIOVERSION N/A 04/02/2013   Procedure: TRANSESOPHAGEAL ECHOCARDIOGRAM (TEE);  Surgeon: Thayer Headings, MD;  Location: [facility];  Service: Cardiovascular;  Laterality: N/A;    Prior to Admission medications   Medication Sig Start Date End Date Taking? Authorizing Provider  amLODipine (NORVASC) 5 MG tablet TAKE 1 TABLET BY MOUTH EVERY DAY 03/26/16  Yes Jolaine Artist, MD  Biotin 10 MG CAPS Take 10 mg by mouth daily.   Yes Historical Provider, MD  carvedilol (COREG) 6.25 MG tablet  TAKE 1 AND 1/2 TABLETS BY MOUTH TWICE A DAY WITH A MEAL 05/10/16  Yes Larey Dresser, MD  cholecalciferol (VITAMIN D) 1000 units tablet Take 1,000 Units by mouth daily.   Yes Historical Provider, MD  glimepiride (AMARYL) 1 MG tablet Take 1 mg by mouth daily before breakfast.   Yes Historical Provider, MD  latanoprost (XALATAN) 0.005 % ophthalmic solution Place 1 drop into both eyes at bedtime.   Yes Historical Provider, MD  lisinopril (PRINIVIL,ZESTRIL) 20 MG tablet Take 20 mg by mouth daily.   Yes Historical Provider, MD  lovastatin (MEVACOR) 40 MG tablet Take 40 mg by mouth at bedtime.   Yes Historical Provider, MD  warfarin (COUMADIN) 2 MG tablet Take 2-4 mg by mouth daily. 4 mg everyday. Except on "Sunday patient takes 2 mg   Yes Historical Provider, MD  traMADol (ULTRAM) 50 MG tablet Take 1 tablet (50 mg total) by mouth every 6 (six) hours as needed. Patient not taking: Reported on 05/24/2016 05/30/14   Lauren  Parker, PA-C  traMADol (ULTRAM) 50 MG tablet Take 1 tablet (50 mg total) by mouth every 6 (six) hours as needed. Patient not taking: Reported on 05/24/2016 07/05/15   Alyssa A Haney, MD    Scheduled Meds: . amLODipine  5 mg Oral Daily  . carvedilol  9.375 mg Oral BID WC  . insulin aspart  0-9 Units Subcutaneous Q4H  . latanoprost  1 drop Both Eyes QHS  . lisinopril  20 mg Oral Daily  . pantoprazole  40 mg Intravenous Q12H  . pravastatin  40 mg Oral q1800   Infusions: . sodium chloride 125 mL/hr at 05/24/16 2218   PRN Meds: acetaminophen **OR** acetaminophen, ondansetron **OR** ondansetron (ZOFRAN) IV   Allergies as of 05/24/2016  . (No Known Allergies)    Family History  Problem Relation Age of Onset  . Other Mother     died in her 70's - 'just got sick.'  . Other Father     died @ 84, unknown cause.  . Diabetes Brother   . Other      12"  siblings + 7 more 1/2 siblings.    Social History   Social History  . Marital status: Widowed    Spouse name: N/A  . Number of children: N/A  . Years of education: N/A   Occupational History  . Not on file.   Social History Main Topics  . Smoking status: Never Smoker  . Smokeless tobacco: Never Used  . Alcohol use No  . Drug use: No  . Sexual activity: No   Other Topics Concern  . Not on file   Social History Narrative   Lives in [place] with her son.    REVIEW OF SYSTEMS: Constitutional:  Generally, for her age she is active. She is able to sleep her porch without limitations. However in the day or 2 prior to coming to the emergency room she did have the weakness. ENT:  + nasal congestion. No nose bleeds Pulm:  + cough and congestion CV:  No palpitations, no LE edema.  GU:  No hematuria, no frequency GI:  Per HPI Heme:  Never previously had issues with anemia or need for iron supplementation. She does not have excessive bleeding or bruising.   Transfusions:  She had never had transfusions before last night. Neuro:   No headaches, no peripheral tingling or numbness.  No blurry vision Derm:  No itching, no rash or sores.  Endocrine:  No sweats or  chills.  No polyuria or dysuria Immunization:  Did not inquire as to recent immunizations. Travel:  None beyond local counties in last few months.    PHYSICAL EXAM: Vital signs in last 24 hours: Vitals:   05/25/16 0635 05/25/16 0650  BP: (!) 121/53 (!) 125/52  Pulse: 76 67  Resp: 18 18  Temp: 98.2 F (36.8 C) 97.9 F (36.6 C)   Wt Readings from Last 3 Encounters:  05/25/16 69.5 kg (153 lb 3.2 oz)  07/04/15 71.3 kg (157 lb 3 oz)  05/30/14 68 kg (150 lb)    General: Pleasant, overall well appearing, alert and engaged elderly AAF. Head:  No asymmetry or swelling.  Eyes:  Conjunctiva is pale. No scleral icterus. Ears:  Not obviously hard of hearing.  Nose:  No congestion or discharge. Mouth:  Few remaining teeth. Mucosa is moist and clear. Tongue is midline. Neck:  No JVD, no thyromegaly, no masses. Lungs:  Clear bilaterally. No cough, no labored breathing. Heart: RRR. No MRG. S1, S2 present. Sinus rhythm on telemetry monitor. Abdomen:  Soft. Not tender or distended. No HSM or masses..   Rectal: Grossly melenic stool in the ED. Patient declined my offer to repeat the rectal exam   Musc/Skeltl: No obvious joint deformities, swelling or redness. Extremities:  No CCE.  Neurologic:  No tremor. No limb weakness. Moves all 4 limbs easily. Grossly neurologically unremarkable. Skin:  No rashes or sores. Nodes:  No cervical adenopathy   Psych:  Pleasant, calm, cooperative.  Intake/Output from previous day: 12/04 0701 - 12/05 0700 In: 1091.5 [I.V.:752.5; Blood:289; IV Piggyback:50] Out: 800 [Urine:800] Intake/Output this shift: No intake/output data recorded.  LAB RESULTS:  Recent Labs  05/24/16 1503 05/24/16 2057  WBC 7.2 6.3  HGB 6.5* 7.6*  HCT 20.3* 23.0*  PLT 201 189   BMET Lab Results  Component Value Date   NA 142 05/24/2016   NA  145 07/05/2015   NA 141 07/04/2015   K 3.9 05/24/2016   K 3.9 07/05/2015   K 3.7 07/04/2015   CL 109 05/24/2016   CL 113 (H) 07/05/2015   CL 110 07/04/2015   CO2 25 05/24/2016   CO2 21 (L) 07/05/2015   CO2 24 07/04/2015   GLUCOSE 156 (H) 05/24/2016   GLUCOSE 134 (H) 07/05/2015   GLUCOSE 121 (H) 07/04/2015   BUN 63 (H) 05/24/2016   BUN 34 (H) 07/05/2015   BUN 58 (H) 07/04/2015   CREATININE 1.87 (H) 05/24/2016   CREATININE 1.65 (H) 07/05/2015   CREATININE 3.50 (H) 07/04/2015   CALCIUM 8.5 (L) 05/24/2016   CALCIUM 8.3 (L) 07/05/2015   CALCIUM 7.8 (L) 07/04/2015   LFT  Recent Labs  05/24/16 1503  PROT 5.5*  ALBUMIN 2.8*  AST 14*  ALT 9*  ALKPHOS 36*  BILITOT 0.3   PT/INR Lab Results  Component Value Date   INR 4.16 (HH) 05/24/2016   INR 3.44 (H) 07/05/2015   INR 4.35 (H) 07/04/2015   Hepatitis Panel No results for input(s): HEPBSAG, HCVAB, HEPAIGM, HEPBIGM in the last 72 hours. C-Diff No components found for: CDIFF Lipase     Component Value Date/Time   LIPASE 37 07/03/2015 1425    Drugs of Abuse  No results found for: LABOPIA, COCAINSCRNUR, LABBENZ, AMPHETMU, THCU, LABBARB   RADIOLOGY STUDIES: Ct Head Wo Contrast  Result Date: 05/24/2016 CLINICAL DATA:  Ct head wo, hx of belly bleed/seizure , eval for bleed EXAM: CT HEAD WITHOUT CONTRAST TECHNIQUE: Contiguous axial images were obtained  from the base of the skull through the vertex without intravenous contrast. COMPARISON:  None. FINDINGS: Brain: No evidence of acute infarction, hemorrhage, hydrocephalus, extra-axial collection or mass lesion/mass effect. There is mild volume loss, normal for age. Vascular: No hyperdense vessel or unexpected calcification. Skull: Normal. Negative for fracture or focal lesion. Sinuses/Orbits: Visualize globes and orbits are unremarkable. Visualized sinuses and mastoid air cells are clear. Other: None. IMPRESSION: 1. No acute findings.  No intracranial hemorrhage. Electronically  Signed   By: Lajean Manes M.D.   On: 05/24/2016 18:33    IMPRESSION:   *  Normocytic anemia.  Symptomatic with syncopal spell and melena.  *  Chronic Coumadin for pt with Chads Vasc Score 4: CHF, hx a fib converted to SR after DCCV  *  CKD stage 4.  Current BUN/creat actually better than in 06/2015.     PLAN:     *  Plan EGD tomorrow. We will switch her to oral Protonix and allow her to have full liquids for the remainder of the day. Check INR in the morning. It should be near 1.8 in order to proceed with upper endoscopy. Of note patient is not very interested in pursuing a colonoscopy.   Azucena Freed  05/25/2016, 8:10 AM Pager: [phone]     Attending physician's note   I have taken a history, examined the patient and reviewed the chart. I agree with the Advanced Practitioner's note, impression and recommendations. Melena and ABL anemia insetting of supratherpuetic Coumadin anticoagulation. Afib and CHF with EF=20-25%. Hold Coumadin and reverse anticoagulation. EGD tentatively planned for tomorrow presuming INR < 2.0, ideally INR <1.7. PPI bid for now. Transfusion to keep Hb > 8.   Lucio Edward, MD [facility] [phone] Mon-Fri 8a-5p [phone] after 5p, weekends, holidays

## 2016-05-26 NOTE — Progress Notes (Signed)
Patricia Randall SEG:[id number] DOB: 1928-12-09 DOA: 05/24/2016 PCP: Leonard Downing, MD  Brief narrative:  82 y/o ? P Afib, chad2vasc2=4-5 on coumadin  Sp tee/dccv 03/2013 HFprEF echo 09/2013 htn Dm ty ii + Nephropahty CKD ii-iii  Admitted with dark tarry bm 05/24/16   Past medical history-As per Problem list Chart reviewed as below-   Consultants:  gi  Procedures: Endo 12/6 Impression:               - LA Grade C reflux esophagitis.                           - Benign-appearing esophageal stenosis.                           - Non-bleeding linear erosive gastropathy, Cameron                            erosions.                           - A single non-bleeding angiodysplastic lesion in                            the stomach. Treated with argon plasma coagulation                            (APC).                           - Medium-sized hiatal hernia.                           - A single non-bleeding angiodysplastic lesion in                            the duodenum. Treated with argon plasma coagulation                            (APC).                           - Normal second portion of the duodenum.                            - No specimens collected.  Antibiotics:     Subjective   alert pleasant in nad Just back from endo No sob cp No further bleed awaiting more blood   Objective    Interim History:   Telemetry: nsr   Objective: Vitals:   05/26/16 1000 05/26/16 1010 05/26/16 1020 05/26/16 1047  BP: (!) 88/47 (!) 97/50 (!) 101/44 (!) 117/51  Pulse: 62 64 60 (!) 58  Resp: 16 18 17 18   Temp: 97.8 F (36.6 C)   97.9 F (36.6 C)  TempSrc: Oral   Oral  SpO2: 100% 100% 100% 95%  Weight:      Height:        Intake/Output Summary (Last 24 hours) at 05/26/16 1050 Last data filed at 05/26/16 0954  Gross per 24 hour  Intake  0 ml  Output             1800 ml  Net            -1800 ml    Exam:  General: eomi ncat  wig Cardiovascular: s1 s 2no m/r/g Respiratory: clear no added sound Abdomen:  Soft nt nd no reboudn Skin trace le edema Neuro intaqct  Data Reviewed: Basic Metabolic Panel:  Recent Labs Lab 05/24/16 1503 05/25/16 1155  NA 142 142  K 3.9 3.8  CL 109 113*  CO2 25 26  GLUCOSE 156* 144*  BUN 63* 60*  CREATININE 1.87* 1.71*  CALCIUM 8.5* 7.9*   Liver Function Tests:  Recent Labs Lab 05/24/16 1503  AST 14*  ALT 9*  ALKPHOS 36*  BILITOT 0.3  PROT 5.5*  ALBUMIN 2.8*   No results for input(s): LIPASE, AMYLASE in the last 168 hours. No results for input(s): AMMONIA in the last 168 hours. CBC:  Recent Labs Lab 05/24/16 1503 05/24/16 2057 05/25/16 1155 05/26/16 0409  WBC 7.2 6.3 5.9 5.5  NEUTROABS 5.7  --   --   --   HGB 6.5* 7.6* 7.4* 6.5*  HCT 20.3* 23.0* 21.8* 19.4*  MCV 82.9 83.0 82.3 83.3  PLT 201 189 170 156   Cardiac Enzymes:  Recent Labs Lab 05/24/16 1503  CKTOTAL 45   BNP: Invalid input(s): POCBNP CBG:  Recent Labs Lab 05/25/16 1658 05/25/16 1943 05/26/16 0001 05/26/16 0416 05/26/16 0825  GLUCAP 119* 120* 96 92 111*    No results found for this or any previous visit (from the past 240 hour(s)).   Studies:              All Imaging reviewed and is as per above notation   Scheduled Meds: . sodium chloride   Intravenous Once  . amLODipine  5 mg Oral Daily  . carvedilol  9.375 mg Oral BID WC  . insulin aspart  0-9 Units Subcutaneous Q4H  . latanoprost  1 drop Both Eyes QHS  . lisinopril  20 mg Oral Daily  . pantoprazole  40 mg Oral BID  . pravastatin  40 mg Oral q1800   Continuous Infusions: . sodium chloride 125 mL/hr at 05/26/16 0501  . lactated ringers 10 mL/hr at 05/26/16 0912     Assessment/Plan:  1. ? UGIB-clear liquids, Endoscope performed 05/26/16 showing erosive gastritis/gerd/H hernia.  Vit K given x 2.  inr 1.3  Cont Protonix 40 po bid indef.  CLD today and grad diet 12/7 2. Anemia of acute gi blood loss-given 2 U  PRBC-reversing coumadin.  Hb 6.5-->7.4-->6.5-2 more units pending to trasnfuse 12/6 3. Afib CHad2Vasc2 score=5.  Hold coumadin. Cont coreg 9.375 as is in NSR can d/c tele in am if all stable.  Coumadin to resume 05/30/16 4. Htn-cont amlodipine 5 daily, lisinopril 20 qd--might adjust if prn 5. hld-hold statin pravachol 40 for now 6. DM ty ii with nephropathy-Bun elevated disprop raising concern for UGIB.  Stable.  Sugars 92-111  Inpatient No family , talked with son on 12/6 full code  Verneita Griffes, MD  [facility] Pager [phone] 05/26/2016, 10:50 AM    LOS: 2 days

## 2016-05-26 NOTE — Op Note (Addendum)
[facility] Patient Name: Patricia Randall Procedure Date : 05/26/2016 MRN: [id number] Attending MD: Ladene Artist , MD Date of Birth: January 11, 1929 CSN: [id number] Age: 82 Admit Type: Inpatient Procedure:                Upper GI endoscopy Indications:              Melena Providers:                Pricilla Riffle. Fuller Plan, MD, Cleda Daub, RN, Cherylynn Ridges, Technician, Judeth Cornfield, CRNA Referring MD:             [facility] Medicines:                Monitored Anesthesia Care Complications:            No immediate complications. Estimated Blood Loss:     Estimated blood loss was minimal. Procedure:                Pre-Anesthesia Assessment:                           - Prior to the procedure, a History and Physical                            was performed, and patient medications and                            allergies were reviewed. The patient's tolerance of                            previous anesthesia was also reviewed. The risks                            and benefits of the procedure and the sedation                            options and risks were discussed with the patient.                            All questions were answered, and informed consent                            was obtained. Prior Anticoagulants: The patient has                            taken Coumadin (warfarin), last dose was 2 days                            prior to procedure. ASA Grade Assessment: III - A                            patient with severe systemic disease. After  reviewing the risks and benefits, the patient was                            deemed in satisfactory condition to undergo the                            procedure.                           After obtaining informed consent, the endoscope was                            passed under direct vision. Throughout the                            procedure, the patient's blood  pressure, pulse, and                            oxygen saturations were monitored continuously. The                            EG-[id number] ([id number]) scope was introduced through the                            mouth, and advanced to the second part of duodenum.                            The upper GI endoscopy was accomplished without                            difficulty. The patient tolerated the procedure                            well. Scope In: Scope Out: Findings:      LA Grade C (one or more mucosal breaks continuous between tops of 2 or       more mucosal folds, less than 75% circumference) esophagitis with no       bleeding was found in the distal esophagus.      One mild benign-appearing, intrinsic stenosis was found at the       gastroesophageal junction. This measured 1.3 cm (inner diameter) and was       traversed.      The exam of the esophagus was otherwise normal.      A few localized, small, linear, non-bleeding erosions were found in the       gastric fundus associated the the hiatal hernia. There were no stigmata       of recent bleeding.      A single 3 mm no bleeding angiodysplastic lesion was found in the       gastric body. Coagulation for hemostasis using argon plasma was       successful.      A medium-sized hiatal hernia was present.      The exam of the stomach was otherwise normal.      A single 3 mm angiodysplastic lesion without bleeding was found in the  duodenal bulb. Coagulation for hemostasis using argon plasma was       successful.      The second portion of the duodenum was normal. Impression:               - LA Grade C reflux esophagitis.                           - Benign-appearing esophageal stenosis.                           - Non-bleeding linear erosive gastropathy, Cameron                            erosions.                           - A single non-bleeding angiodysplastic lesion in                            the stomach. Treated with  argon plasma coagulation                            (APC).                           - Medium-sized hiatal hernia.                           - A single non-bleeding angiodysplastic lesion in                            the duodenum. Treated with argon plasma coagulation                            (APC).                           - Normal second portion of the duodenum.                           - No specimens collected. Moderate Sedation:      none/MAC Recommendation:           - Return patient to hospital ward for ongoing care.                           - Ok to resume Coumadin (warfarin) at prior dose in                            4 days as indicated. Refer to managing physician                            for further adjustment of therapy.                           - Protonix (pantoprazole) 40 mg PO BID indefinitely.                           -  GI signing off. Outpatient GI follow up with Dr.                            Loletha Carrow in 1 month.                           - Avoid ASA/NSAIDs long term                           - Clear liquid diet. Procedure Code(s):        --- Professional ---                           [phone], Esophagogastroduodenoscopy, flexible,                            transoral; with control of bleeding, any method Diagnosis Code(s):        --- Professional ---                           K21.0, Gastro-esophageal reflux disease with                            esophagitis                           K22.2, Esophageal obstruction                           K31.89, Other diseases of stomach and duodenum                           K31.819, Angiodysplasia of stomach and duodenum                            without bleeding                           K44.9, Diaphragmatic hernia without obstruction or                            gangrene                           K92.1, Melena (includes Hematochezia) CPT copyright 2016 American Medical Association. All rights reserved. The codes  documented in this report are preliminary and upon coder review may  be revised to meet current compliance requirements. Ladene Artist, MD 05/26/2016 10:05:41 AM This report has been signed electronically. Number of Addenda: 0

## 2016-05-26 NOTE — Progress Notes (Signed)
Per order, pt ambulated 939 ft without complication.

## 2016-05-26 NOTE — Transfer of Care (Signed)
Immediate Anesthesia Transfer of Care Note  Patient: Patricia Randall  Procedure(s) Performed: Procedure(s): ESOPHAGOGASTRODUODENOSCOPY (EGD) (N/A)  Patient Location: PACU and Endoscopy Unit  Anesthesia Type:MAC  Level of Consciousness: awake, patient cooperative and responds to stimulation  Airway & Oxygen Therapy: Patient Spontanous Breathing and Patient connected to nasal cannula oxygen  Post-op Assessment: Report given to RN and Post -op Vital signs reviewed and stable  Post vital signs: Reviewed and stable  Last Vitals:  Vitals:   05/26/16 0420 05/26/16 0910  BP: (!) 130/56 (!) 155/54  Pulse: 73 65  Resp: 18 17  Temp: 36.7 C 36.8 C    Last Pain:  Vitals:   05/26/16 0910  TempSrc: Oral  PainSc:          Complications: No apparent anesthesia complications

## 2016-05-26 NOTE — Progress Notes (Signed)
PT Cancellation Note  Patient Details Name: [name] MRN: [id number] DOB: 06-Jul-1928   Cancelled Treatment:    Reason Eval/Treat Not Completed: Medical issues which prohibited therapy. Pt's hemoglobin at 6.5 this AM. PT will continue to f/u with pt for PT evaluation as appropriate and medically stable.  Clearnce Sorrel Trevionne Advani 05/26/2016, [place], PT, DPT [phone]

## 2016-05-27 ENCOUNTER — Encounter (HOSPITAL_COMMUNITY): Payer: Self-pay | Admitting: Gastroenterology

## 2016-05-27 LAB — CBC
HEMATOCRIT: 21.7 % — AB (ref 36.0–46.0)
Hemoglobin: 7.3 g/dL — ABNORMAL LOW (ref 12.0–15.0)
MCH: 28.2 pg (ref 26.0–34.0)
MCHC: 33.6 g/dL (ref 30.0–36.0)
MCV: 83.8 fL (ref 78.0–100.0)
PLATELETS: 171 10*3/uL (ref 150–400)
RBC: 2.59 MIL/uL — AB (ref 3.87–5.11)
RDW: 15.1 % (ref 11.5–15.5)
WBC: 6.4 10*3/uL (ref 4.0–10.5)

## 2016-05-27 LAB — PROTIME-INR
INR: 1.37
Prothrombin Time: 17 seconds — ABNORMAL HIGH (ref 11.4–15.2)

## 2016-05-27 LAB — GLUCOSE, CAPILLARY
GLUCOSE-CAPILLARY: 117 mg/dL — AB (ref 65–99)
GLUCOSE-CAPILLARY: 104 mg/dL — AB (ref 65–99)
Glucose-Capillary: 84 mg/dL (ref 65–99)

## 2016-05-27 MED ORDER — FERROUS SULFATE 325 (65 FE) MG PO TABS: 325.0000 mg | ORAL_TABLET | Freq: Every day | ORAL | 0 refills | Status: DC

## 2016-05-27 MED ORDER — WARFARIN SODIUM 2 MG PO TABS: 2.0000 mg | ORAL_TABLET | Freq: Every day | ORAL | 0 refills | Status: DC

## 2016-05-27 NOTE — Anesthesia Postprocedure Evaluation (Signed)
Anesthesia Post Note  Patient: Patricia Randall  Procedure(s) Performed: Procedure(s) (LRB): ESOPHAGOGASTRODUODENOSCOPY (EGD) (N/A)  Patient location during evaluation: PACU Anesthesia Type: MAC Level of consciousness: awake and alert Pain management: pain level controlled Vital Signs Assessment: post-procedure vital signs reviewed and stable Respiratory status: spontaneous breathing, nonlabored ventilation, respiratory function stable and patient connected to nasal cannula oxygen Cardiovascular status: stable and blood pressure returned to baseline Anesthetic complications: no    Last Vitals:  Vitals:   05/26/16 2009 05/27/16 0430  BP: 127/62 (!) 153/52  Pulse: 65 (!) 59  Resp: 16 16  Temp: 36.9 C 36.7 C    Last Pain:  Vitals:   05/27/16 0430  TempSrc: Oral  PainSc:                  Marathon S

## 2016-05-27 NOTE — Progress Notes (Signed)
Orders received for pt discharge.  Discharge summary printed and reviewed with pt.  Explained warafarin medication regimen, and issued educational materials at discharge.  Explained medication regimen, and pt had no further questions at this time.  IV removed and site remains clean, dry, intact.  Telemetry removed.  Pt in stable condition and awaiting transport.

## 2016-05-27 NOTE — Discharge Summary (Signed)
Physician Discharge Summary  Patricia Randall GMW:[id number] DOB: 06-07-1929 DOA: 05/24/2016  PCP: Leonard Downing, MD  Admit date: 05/24/2016 Discharge date: 05/27/2016  Time spent: 25 minutes  Recommendations for Outpatient Follow-up:  1. Needs to resume coumadin 12/11-not before as risk for bleed 2. Iron started this admission 3. Needs CBc in about 1-2 weeks and consider iron studies  Discharge Diagnoses:  Principal Problem:   Symptomatic anemia Active Problems:   Permanent atrial fibrillation (HCC)   Chronic diastolic congestive heart failure (HCC)   Essential hypertension, benign   Chronic kidney disease, stage IV (severe) (HCC)   Protein-calorie malnutrition, severe   GI bleed   Discharge Condition: good  Diet recommendation: regular  Filed Weights   05/25/16 0412 05/26/16 0420 05/27/16 0430  Weight: 69.5 kg (153 lb 3.2 oz) 71.5 kg (157 lb 11.2 oz) 72 kg (158 lb 12.8 oz)    History of present illness:  82 y/o ? P Afib, chad2vasc2=4-5 on coumadin             Sp tee/dccv 03/2013 HFprEF echo 09/2013 htn Dm ty ii + Nephropahty CKD ii-iii  Admitted with dark tarry bm 05/24/16  Hospital Course:  1. UGIB- Endoscope performed 05/26/16 showing erosive gastritis/gerd/H hernia.  Vit K given x 2.  inr 1.3  Cont Protonix 40 po bid indef.  tolerated well grad diet 12/7 2. Anemia of acute gi blood loss-given 2 U PRBC-reversing coumadin.  Hb 6.5-->7.4-->6.5-2 more units pending to trasnfuse 12/6--Hemoglobin on d/c was 7.3 3. Afib CHad2Vasc2 score=5.  Hold coumadin. Cont coreg 9.375 as is in NSR can d/c tele in am if all stable.  Coumadin to resume 05/30/16 4. Htn-cont amlodipine 5 daily, lisinopril 20 qd--might adjust if prn 5. hld-hold statin pravachol 40 for now 6. DM ty ii with nephropathy-Bun elevated disprop raising concern for UGIB.  Stable.  Sugars stable in hosptial   Discharge Exam: Vitals:   05/26/16 2009 05/27/16 0430  BP: 127/62 (!) 153/52  Pulse: 65 (!) 59   Resp: 16 16  Temp: 98.4 F (36.9 C) 98.1 F (36.7 C)    General: eomi ncat Cardiovascular: s1 s2 no m/r/g Respiratory: clear no added sound abd soft nt nd no rebound no gaurd  Discharge Instructions   Discharge Instructions    Diet - low sodium heart healthy    Complete by:  As directed    Discharge instructions    Complete by:  As directed    Do not take coumadin until 12.11.17 recheck ur blood count in about 2 weeks Start iron tablets and take regularily Follow up with regular MD 2-3 weeks   Increase activity slowly    Complete by:  As directed      Current Discharge Medication List    START taking these medications   Details  ferrous sulfate 325 (65 FE) MG tablet Take 1 tablet (325 mg total) by mouth daily with breakfast. Qty: 30 tablet, Refills: 0      CONTINUE these medications which have CHANGED   Details  warfarin (COUMADIN) 2 MG tablet Take 1 tablet (2 mg total) by mouth daily. 4 mg everyday. Except on Sunday patient takes 2 mg Qty: 4 tablet, Refills: 0      CONTINUE these medications which have NOT CHANGED   Details  amLODipine (NORVASC) 5 MG tablet TAKE 1 TABLET BY MOUTH EVERY DAY Qty: 30 tablet, Refills: 3    Biotin 10 MG CAPS Take 10 mg by mouth daily.    carvedilol (COREG)  6.25 MG tablet TAKE 1 AND 1/2 TABLETS BY MOUTH TWICE A DAY WITH A MEAL Qty: 90 tablet, Refills: 2    cholecalciferol (VITAMIN D) 1000 units tablet Take 1,000 Units by mouth daily.    glimepiride (AMARYL) 1 MG tablet Take 1 mg by mouth daily before breakfast.    latanoprost (XALATAN) 0.005 % ophthalmic solution Place 1 drop into both eyes at bedtime.    lisinopril (PRINIVIL,ZESTRIL) 20 MG tablet Take 20 mg by mouth daily.    lovastatin (MEVACOR) 40 MG tablet Take 40 mg by mouth at bedtime.    !! traMADol (ULTRAM) 50 MG tablet Take 1 tablet (50 mg total) by mouth every 6 (six) hours as needed. Qty: 15 tablet, Refills: 0    !! traMADol (ULTRAM) 50 MG tablet Take 1 tablet  (50 mg total) by mouth every 6 (six) hours as needed. Qty: 30 tablet, Refills: 0     !! - Potential duplicate medications found. Please discuss with provider.     No Known Allergies    The results of significant diagnostics from this hospitalization (including imaging, microbiology, ancillary and laboratory) are listed below for reference.    Significant Diagnostic Studies: Ct Head Wo Contrast  Result Date: 05/24/2016 CLINICAL DATA:  Ct head wo, hx of belly bleed/seizure , eval for bleed EXAM: CT HEAD WITHOUT CONTRAST TECHNIQUE: Contiguous axial images were obtained from the base of the skull through the vertex without intravenous contrast. COMPARISON:  None. FINDINGS: Brain: No evidence of acute infarction, hemorrhage, hydrocephalus, extra-axial collection or mass lesion/mass effect. There is mild volume loss, normal for age. Vascular: No hyperdense vessel or unexpected calcification. Skull: Normal. Negative for fracture or focal lesion. Sinuses/Orbits: Visualize globes and orbits are unremarkable. Visualized sinuses and mastoid air cells are clear. Other: None. IMPRESSION: 1. No acute findings.  No intracranial hemorrhage. Electronically Signed   By: Lajean Manes M.D.   On: 05/24/2016 18:33    Microbiology: No results found for this or any previous visit (from the past 240 hour(s)).   Labs: Basic Metabolic Panel:  Recent Labs Lab 05/24/16 1503 05/25/16 1155  NA 142 142  K 3.9 3.8  CL 109 113*  CO2 25 26  GLUCOSE 156* 144*  BUN 63* 60*  CREATININE 1.87* 1.71*  CALCIUM 8.5* 7.9*   Liver Function Tests:  Recent Labs Lab 05/24/16 1503  AST 14*  ALT 9*  ALKPHOS 36*  BILITOT 0.3  PROT 5.5*  ALBUMIN 2.8*   No results for input(s): LIPASE, AMYLASE in the last 168 hours. No results for input(s): AMMONIA in the last 168 hours. CBC:  Recent Labs Lab 05/24/16 1503 05/24/16 2057 05/25/16 1155 05/26/16 0409 05/26/16 1549 05/27/16 0256  WBC 7.2 6.3 5.9 5.5 6.3 6.4   NEUTROABS 5.7  --   --   --  3.9  --   HGB 6.5* 7.6* 7.4* 6.5* 8.1* 7.3*  HCT 20.3* 23.0* 21.8* 19.4* 23.9* 21.7*  MCV 82.9 83.0 82.3 83.3 84.2 83.8  PLT 201 189 170 156 162 171   Cardiac Enzymes:  Recent Labs Lab 05/24/16 1503  CKTOTAL 45   BNP: BNP (last 3 results) No results for input(s): BNP in the last 8760 hours.  ProBNP (last 3 results) No results for input(s): PROBNP in the last 8760 hours.  CBG:  Recent Labs Lab 05/26/16 1632 05/26/16 2006 05/27/16 0020 05/27/16 0426 05/27/16 0758  GLUCAP 117* 91 84 117* 104*       Signed:  Nita Sells MD  [facility] 05/27/2016, 9:53 AM

## 2016-05-28 LAB — TYPE AND SCREEN
ABO/RH(D): O POS
ANTIBODY SCREEN: NEGATIVE
UNIT DIVISION: 0
Unit division: 0
Unit division: 0
Unit division: 0

## 2016-07-21 ENCOUNTER — Other Ambulatory Visit (HOSPITAL_COMMUNITY): Payer: Self-pay | Admitting: Cardiology

## 2016-08-12 ENCOUNTER — Other Ambulatory Visit (HOSPITAL_COMMUNITY): Payer: Self-pay | Admitting: Internal Medicine

## 2016-10-01 ENCOUNTER — Other Ambulatory Visit: Payer: Self-pay | Admitting: *Deleted

## 2016-10-05 ENCOUNTER — Other Ambulatory Visit (HOSPITAL_COMMUNITY): Payer: Self-pay | Admitting: Cardiology

## 2016-10-24 ENCOUNTER — Other Ambulatory Visit (HOSPITAL_COMMUNITY): Payer: Self-pay | Admitting: Cardiology

## 2016-11-19 ENCOUNTER — Other Ambulatory Visit (HOSPITAL_COMMUNITY): Payer: Self-pay | Admitting: Cardiology

## 2017-02-28 ENCOUNTER — Other Ambulatory Visit (HOSPITAL_COMMUNITY): Payer: Self-pay | Admitting: Internal Medicine

## 2017-05-05 ENCOUNTER — Other Ambulatory Visit (HOSPITAL_COMMUNITY): Payer: Self-pay | Admitting: Internal Medicine

## 2017-07-05 ENCOUNTER — Encounter (HOSPITAL_COMMUNITY): Payer: Self-pay

## 2017-07-05 ENCOUNTER — Emergency Department (HOSPITAL_COMMUNITY)
Admission: EM | Admit: 2017-07-05 | Discharge: 2017-07-05 | Disposition: A | Discharge status: Home / Self Care | Payer: Medicare Other | Attending: Emergency Medicine | Admitting: Emergency Medicine

## 2017-07-05 ENCOUNTER — Other Ambulatory Visit: Payer: Self-pay

## 2017-07-05 ENCOUNTER — Emergency Department (HOSPITAL_COMMUNITY): Payer: Medicare Other

## 2017-07-05 DIAGNOSIS — Z7984 Long term (current) use of oral hypoglycemic drugs: Secondary | ICD-10-CM | POA: Insufficient documentation

## 2017-07-05 DIAGNOSIS — R0602 Shortness of breath: Secondary | ICD-10-CM | POA: Diagnosis present

## 2017-07-05 DIAGNOSIS — I130 Hypertensive heart and chronic kidney disease with heart failure and stage 1 through stage 4 chronic kidney disease, or unspecified chronic kidney disease: Secondary | ICD-10-CM | POA: Diagnosis not present

## 2017-07-05 DIAGNOSIS — Z79899 Other long term (current) drug therapy: Secondary | ICD-10-CM | POA: Diagnosis not present

## 2017-07-05 DIAGNOSIS — N184 Chronic kidney disease, stage 4 (severe): Secondary | ICD-10-CM | POA: Insufficient documentation

## 2017-07-05 DIAGNOSIS — I509 Heart failure, unspecified: Secondary | ICD-10-CM | POA: Diagnosis not present

## 2017-07-05 DIAGNOSIS — Z7901 Long term (current) use of anticoagulants: Secondary | ICD-10-CM | POA: Diagnosis not present

## 2017-07-05 DIAGNOSIS — E1122 Type 2 diabetes mellitus with diabetic chronic kidney disease: Secondary | ICD-10-CM | POA: Insufficient documentation

## 2017-07-05 LAB — PROTIME-INR
INR: 1.99
Prothrombin Time: 22.4 seconds — ABNORMAL HIGH (ref 11.4–15.2)

## 2017-07-05 LAB — CBC WITH DIFFERENTIAL/PLATELET
BASOS ABS: 0 10*3/uL (ref 0.0–0.1)
Basophils Relative: 1 %
EOS ABS: 0.1 10*3/uL (ref 0.0–0.7)
EOS PCT: 4 %
HEMATOCRIT: 39.2 % (ref 36.0–46.0)
Hemoglobin: 12.8 g/dL (ref 12.0–15.0)
Lymphocytes Relative: 39 %
Lymphs Abs: 1.3 10*3/uL (ref 0.7–4.0)
MCH: 27.2 pg (ref 26.0–34.0)
MCHC: 32.7 g/dL (ref 30.0–36.0)
MCV: 83.2 fL (ref 78.0–100.0)
MONO ABS: 0.4 10*3/uL (ref 0.1–1.0)
MONOS PCT: 10 %
NEUTROS ABS: 1.6 10*3/uL — AB (ref 1.7–7.7)
Neutrophils Relative %: 46 %
RBC: 4.71 MIL/uL (ref 3.87–5.11)
RDW: 17.1 % — AB (ref 11.5–15.5)
WBC: 3.4 10*3/uL — ABNORMAL LOW (ref 4.0–10.5)

## 2017-07-05 LAB — BASIC METABOLIC PANEL
Anion gap: 10 (ref 5–15)
BUN: 28 mg/dL — AB (ref 6–20)
CALCIUM: 8.9 mg/dL (ref 8.9–10.3)
CHLORIDE: 107 mmol/L (ref 101–111)
CO2: 23 mmol/L (ref 22–32)
CREATININE: 1.58 mg/dL — AB (ref 0.44–1.00)
GFR, EST AFRICAN AMERICAN: 33 mL/min — AB (ref >60)
GFR, EST NON AFRICAN AMERICAN: 28 mL/min — AB (ref >60)
Glucose, Bld: 150 mg/dL — ABNORMAL HIGH (ref 65–99)
Potassium: 4.2 mmol/L (ref 3.5–5.1)
Sodium: 140 mmol/L (ref 135–145)

## 2017-07-05 LAB — BRAIN NATRIURETIC PEPTIDE: B NATRIURETIC PEPTIDE 5: 524.5 pg/mL — AB (ref 0.0–100.0)

## 2017-07-05 MED ORDER — FUROSEMIDE 20 MG PO TABS: 20.0000 mg | ORAL_TABLET | Freq: Every day | ORAL | 0 refills | Status: DC

## 2017-07-05 MED ORDER — FUROSEMIDE 20 MG PO TABS
20.0000 mg | ORAL_TABLET | Freq: Once | ORAL | Status: AC
  Administered 2017-07-05: 20 mg via ORAL
  Filled 2017-07-05: qty 1

## 2017-07-05 MED ORDER — FUROSEMIDE 10 MG/ML IJ SOLN: 20.0000 mg | Freq: Once | INTRAMUSCULAR | Status: DC

## 2017-07-05 MED ORDER — ALBUTEROL SULFATE (2.5 MG/3ML) 0.083% IN NEBU: 5.0000 mg | INHALATION_SOLUTION | Freq: Once | RESPIRATORY_TRACT | Status: DC

## 2017-07-05 NOTE — ED Notes (Signed)
Pt stable, ambulatory, states understanding of discharge instructions 

## 2017-07-05 NOTE — ED Triage Notes (Signed)
Pt reports she has had SOB X 1 week, she reports increasing SOB when lying flat. She states it "didn't bother me at all last night but I had a ride today so I thought I better get checked out".

## 2017-07-05 NOTE — ED Notes (Signed)
Got patient undress on the monitor patient is resting with family at bedside

## 2017-07-05 NOTE — ED Provider Notes (Addendum)
[facility] EMERGENCY DEPARTMENT Provider Note  CSN: [id number] Arrival date & time: 07/05/17 1024  Chief Complaint(s) Shortness of Breath  HPI Patricia Randall is a 84 y.o. female h/o CHF (initial EF 20-25%; last ECHO in 2015 showed EF of 60-65%)  The history is provided by the patient.  Shortness of Breath  This is a recurrent problem. The average episode lasts 2 weeks. The problem occurs continuously.The problem has not changed since onset.Associated symptoms include orthopnea and leg swelling. Pertinent negatives include no fever, no rhinorrhea, no cough, no sputum production, no chest pain, no abdominal pain and no leg pain. The treatment provided moderate relief. Associated medical issues include heart failure.   She denies DOE. States that the orthopnea is the most concerning part.    Past Medical History Past Medical History:  Diagnosis Date  . Atrial fibrillation ([place])    a. 03/2013 s/p TEE/DCCV;  b. 03/2013 Eliquis initiated.  . Chronic systolic CHF (congestive heart failure) ([place])    a. 03/2013 Echo: EF 20-25%, diff HK, mild to mod MR, sev dil LA, mild RV dysfxn, sev dil RA, mod TR.  . Claudication ([place])   . Glaucoma   . High cholesterol   . Hypertension   . Type II diabetes mellitus [facility])    Patient Active Problem List   Diagnosis Date Noted  . Symptomatic anemia 05/24/2016  . GI bleed 05/24/2016  . Protein-calorie malnutrition, severe 07/05/2015  . Chronic kidney disease, stage IV (severe) ([place])   . Essential hypertension, benign 03/13/2014  . Chronic diastolic congestive heart failure ([place]) 04/12/2013  . Cardiomyopathy ([place]) 04/01/2013  . Ventricular tachycardia, non-sustained ([place]) 04/01/2013  . Permanent atrial fibrillation ([place]) 03/31/2013   Home Medication(s) Prior to Admission medications   Medication Sig Start Date End Date Taking? Authorizing Provider  amLODipine (NORVASC) 5 MG tablet TAKE 1 TABLET BY MOUTH EVERY DAY 03/26/16   Bensimhon,  Shaune Pascal, MD  Biotin 10 MG CAPS Take 10 mg by mouth daily.    [provider]  carvedilol (COREG) 6.25 MG tablet TAKE 1 AND 1/2 TABLETS BY MOUTH TWICE A DAY WITH A MEAL 03/01/17   Bensimhon, Shaune Pascal, MD  cholecalciferol (VITAMIN D) 1000 units tablet Take 1,000 Units by mouth daily.    [provider]  ferrous sulfate 325 (65 FE) MG tablet Take 1 tablet (325 mg total) by mouth daily with breakfast. 05/27/16   Nita Sells, MD  furosemide (LASIX) 20 MG tablet Take 1 tablet (20 mg total) by mouth daily. 07/05/17   Ota Ebersole, Grayce Sessions, MD  glimepiride (AMARYL) 1 MG tablet Take 1 mg by mouth daily before breakfast.    [provider]  latanoprost (XALATAN) 0.005 % ophthalmic solution Place 1 drop into both eyes at bedtime.    [provider]  lisinopril (PRINIVIL,ZESTRIL) 20 MG tablet Take 20 mg by mouth daily.    [provider]  lovastatin (MEVACOR) 40 MG tablet Take 40 mg by mouth at bedtime.    [provider]  traMADol (ULTRAM) 50 MG tablet Take 1 tablet (50 mg total) by mouth every 6 (six) hours as needed. Patient not taking: Reported on 05/24/2016 05/30/14   Harvie Heck, PA-C  traMADol (ULTRAM) 50 MG tablet Take 1 tablet (50 mg total) by mouth every 6 (six) hours as needed. Patient not taking: Reported on 05/24/2016 07/05/15   Veatrice Bourbon, MD  warfarin (COUMADIN) 2 MG tablet Take 1 tablet (2 mg total) by mouth daily. 05/31/16  Nita Sells, MD                                                                                                                                    Past Surgical History Past Surgical History:  Procedure Laterality Date  . CARDIOVERSION N/A 04/02/2013   Procedure: CARDIOVERSION;  Surgeon: Thayer Headings, MD;  Location: [facility];  Service: Cardiovascular;  Laterality: N/A;  Spoke with Tom   . ESOPHAGOGASTRODUODENOSCOPY N/A 05/26/2016   Procedure: ESOPHAGOGASTRODUODENOSCOPY (EGD);  Surgeon:  Ladene Artist, MD;  Location: [facility] ENDOSCOPY;  Service: Endoscopy;  Laterality: N/A;  . EYE SURGERY Right    "had blood in it"   . TEE WITHOUT CARDIOVERSION N/A 04/02/2013   Procedure: TRANSESOPHAGEAL ECHOCARDIOGRAM (TEE);  Surgeon: Thayer Headings, MD;  Location: [facility] ENDOSCOPY;  Service: Cardiovascular;  Laterality: N/A;   Family History Family History  Problem Relation Age of Onset  . Other Mother        died in her 77's - 'just got sick.'  . Other Father        died @ 22, unknown cause.  . Diabetes Brother   . Other Unknown        12 siblings + 7 more 1/2 siblings.    Social History Social History   Tobacco Use  . Smoking status: Never Smoker  . Smokeless tobacco: Never Used  Substance Use Topics  . Alcohol use: No  . Drug use: No   Allergies Patient has no known allergies.  Review of Systems Review of Systems  Constitutional: Negative for fever.  HENT: Negative for rhinorrhea.   Respiratory: Positive for shortness of breath. Negative for cough and sputum production.   Cardiovascular: Positive for orthopnea and leg swelling. Negative for chest pain.  Gastrointestinal: Negative for abdominal pain.   All other systems are reviewed and are negative for acute change except as noted in the HPI  Physical Exam Vital Signs  I have reviewed the triage vital signs BP (!) 140/91   Pulse 77   Temp 98 F (36.7 C) (Oral)   Resp (!) 22   Ht 5\' 2"  (1.575 m)   Wt 71.7 kg (158 lb)   SpO2 100%   BMI 28.90 kg/m   Physical Exam  Constitutional: She is oriented to person, place, and time. She appears well-developed and well-nourished. No distress.  HENT:  Head: Normocephalic and atraumatic.  Nose: Nose normal.  Eyes: Conjunctivae and EOM are normal. Pupils are equal, round, and reactive to light. Right eye exhibits no discharge. Left eye exhibits no discharge. No scleral icterus.  Neck: Normal range of motion. Neck supple.  Cardiovascular: Normal rate and regular rhythm.  Exam reveals no gallop and no friction rub.  No murmur heard. Pulmonary/Chest: Effort normal and breath sounds normal. No stridor. No respiratory distress. She has no rales.  Abdominal: Soft. She exhibits no distension. There is no tenderness.  Musculoskeletal:  She exhibits no edema or tenderness.  2+ BLE pitting edema  Neurological: She is alert and oriented to person, place, and time.  Skin: Skin is warm and dry. No rash noted. She is not diaphoretic. No erythema.  Psychiatric: She has a normal mood and affect.  Vitals reviewed.   ED Results and Treatments Labs (all labs ordered are listed, but only abnormal results are displayed) Labs Reviewed  CBC WITH DIFFERENTIAL/PLATELET - Abnormal; Notable for the following components:      Result Value   WBC 3.4 (*)    RDW 17.1 (*)    Neutro Abs 1.6 (*)    All other components within normal limits  BRAIN NATRIURETIC PEPTIDE - Abnormal; Notable for the following components:   B Natriuretic Peptide 524.5 (*)    All other components within normal limits  BASIC METABOLIC PANEL - Abnormal; Notable for the following components:   Glucose, Bld 150 (*)    BUN 28 (*)    Creatinine, Ser 1.58 (*)    GFR calc non Af Amer 28 (*)    GFR calc Af Amer 33 (*)    All other components within normal limits  PROTIME-INR - Abnormal; Notable for the following components:   Prothrombin Time 22.4 (*)    All other components within normal limits                                                                                                                         EKG  EKG Interpretation  Date/Time:  Tuesday July 05 2017 10:51:56 EST Ventricular Rate:  79 PR Interval:    QRS Duration: 102 QT Interval:  406 QTC Calculation: 465 R Axis:   -76 Text Interpretation:  Atrial fibrillation Left axis deviation Incomplete right bundle branch block Anteroseptal infarct , age undetermined T wave abnormality, consider inferolateral ischemia Abnormal ECG Otherwise  no significant change Confirmed by Addison Lank [phone]) on 07/05/2017 3:36:31 PM      Radiology Dg Chest 2 View  Result Date: 07/05/2017 CLINICAL DATA:  One week of shortness of breath made worse when supine. History of hypertension, CHF, atrial fibrillation. EXAM: CHEST  2 VIEW COMPARISON:  Chest x-ray of July 04, 2015 FINDINGS: The lungs are adequately inflated. The interstitial markings are increased. The cardiac silhouette is enlarged. There is a trace of pleural fluid blunting the costophrenic angles. There is no alveolar infiltrate. There calcification in the wall of the aortic arch. There is multilevel degenerative disc disease of the thoracic spine. IMPRESSION: CHF with mild interstitial edema.  No acute pneumonia. Thoracic aortic atherosclerosis. Electronically Signed   By: David  Martinique M.D.   On: 07/05/2017 11:55   Pertinent labs & imaging results that were available during my care of the patient were reviewed by me and considered in my medical decision making (see chart for details).  Medications Ordered in ED Medications  furosemide (LASIX) tablet 20 mg (20 mg Oral Given 07/05/17 1554)  Procedures Procedures  (including critical care time)  Medical Decision Making / ED Course I have reviewed the nursing notes for this encounter and the patient's prior records (if available in EHR or on provided paperwork).    Presentation is consistent with heart failure exacerbation.  Patient has not had an echocardiogram in several years.  She is well-appearing in no acute distress, satting 98% on room air.  She is denying any exertional dyspnea and only complains of orthopnea.  Chest x-ray does show some mild pulmonary edema. 98% pulse ox with ambulation. I offered admission vs close follow up with PCP to the patient and she decided to follow up closely with  PCP.  I spoke with the patient's primary care office who will be able to follow-up with the patient in the next 2-3 days to reassess kidney function and obtain a repeat echocardiogram.  We will start the patient on 20 mg of p.o. Lasix.  The patient appears reasonably screened and/or stabilized for discharge and I doubt any other medical condition or other [facility] requiring further screening, evaluation, or treatment in the ED at this time prior to discharge.  The patient is safe for discharge with strict return precautions.   Final Clinical Impression(s) / ED Diagnoses Final diagnoses:  Acute on chronic congestive heart failure, unspecified heart failure type ([place])   Disposition: Discharge  Condition: Good  I have discussed the results, Dx and Tx plan with the patient who expressed understanding and agree(s) with the plan. Discharge instructions discussed at great length. The patient was given strict return precautions who verbalized understanding of the instructions. No further questions at time of discharge.    ED Discharge Orders        Ordered    furosemide (LASIX) 20 MG tablet  Daily     07/05/17 1633       Follow Up: Leonard Downing, MD [place] [address] [phone]  On 07/08/2017 to follow up to recheck your renal function and have a repeat ECHO to assess heart function      This chart was dictated using voice recognition software.  Despite best efforts to proofread,  errors can occur which can change the documentation meaning.     Fatima Blank, MD 07/05/17 [phone]

## 2017-09-02 ENCOUNTER — Encounter (HOSPITAL_COMMUNITY): Payer: Self-pay | Admitting: Emergency Medicine

## 2017-09-02 ENCOUNTER — Emergency Department (HOSPITAL_COMMUNITY)
Admission: EM | Admit: 2017-09-02 | Discharge: 2017-09-02 | Disposition: A | Discharge status: Home / Self Care | Payer: Medicare Other | Attending: Emergency Medicine | Admitting: Emergency Medicine

## 2017-09-02 ENCOUNTER — Emergency Department (HOSPITAL_BASED_OUTPATIENT_CLINIC_OR_DEPARTMENT_OTHER)
Admit: 2017-09-02 | Discharge: 2017-09-02 | Disposition: A | Discharge status: Home / Self Care | Payer: Medicare Other | Attending: Emergency Medicine | Admitting: Emergency Medicine

## 2017-09-02 DIAGNOSIS — M79609 Pain in unspecified limb: Secondary | ICD-10-CM | POA: Diagnosis not present

## 2017-09-02 DIAGNOSIS — L03115 Cellulitis of right lower limb: Secondary | ICD-10-CM | POA: Insufficient documentation

## 2017-09-02 DIAGNOSIS — I110 Hypertensive heart disease with heart failure: Secondary | ICD-10-CM | POA: Diagnosis not present

## 2017-09-02 DIAGNOSIS — Z7901 Long term (current) use of anticoagulants: Secondary | ICD-10-CM | POA: Diagnosis not present

## 2017-09-02 DIAGNOSIS — Z7984 Long term (current) use of oral hypoglycemic drugs: Secondary | ICD-10-CM | POA: Diagnosis not present

## 2017-09-02 DIAGNOSIS — I5022 Chronic systolic (congestive) heart failure: Secondary | ICD-10-CM | POA: Insufficient documentation

## 2017-09-02 DIAGNOSIS — E119 Type 2 diabetes mellitus without complications: Secondary | ICD-10-CM | POA: Insufficient documentation

## 2017-09-02 DIAGNOSIS — M79604 Pain in right leg: Secondary | ICD-10-CM | POA: Diagnosis present

## 2017-09-02 DIAGNOSIS — Z79899 Other long term (current) drug therapy: Secondary | ICD-10-CM | POA: Diagnosis not present

## 2017-09-02 LAB — BASIC METABOLIC PANEL WITH GFR
Anion gap: 12 (ref 5–15)
BUN: 44 mg/dL — ABNORMAL HIGH (ref 6–20)
CO2: 23 mmol/L (ref 22–32)
Calcium: 9.2 mg/dL (ref 8.9–10.3)
Chloride: 106 mmol/L (ref 101–111)
Creatinine, Ser: 1.79 mg/dL — ABNORMAL HIGH (ref 0.44–1.00)
GFR calc Af Amer: 28 mL/min — ABNORMAL LOW
GFR calc non Af Amer: 25 mL/min — ABNORMAL LOW
Glucose, Bld: 148 mg/dL — ABNORMAL HIGH (ref 65–99)
Potassium: 4 mmol/L (ref 3.5–5.1)
Sodium: 141 mmol/L (ref 135–145)

## 2017-09-02 LAB — URINALYSIS, ROUTINE W REFLEX MICROSCOPIC
Bilirubin Urine: NEGATIVE
Glucose, UA: NEGATIVE mg/dL
Hgb urine dipstick: NEGATIVE
Ketones, ur: NEGATIVE mg/dL
Leukocytes, UA: NEGATIVE
Nitrite: NEGATIVE
Protein, ur: NEGATIVE mg/dL
Specific Gravity, Urine: 1.01 (ref 1.005–1.030)
pH: 5 (ref 5.0–8.0)

## 2017-09-02 LAB — CBC WITH DIFFERENTIAL/PLATELET
Basophils Absolute: 0 10*3/uL (ref 0.0–0.1)
Basophils Relative: 0 %
Eosinophils Absolute: 0.1 10*3/uL (ref 0.0–0.7)
Eosinophils Relative: 2 %
HCT: 41.7 % (ref 36.0–46.0)
Hemoglobin: 13.5 g/dL (ref 12.0–15.0)
Lymphocytes Relative: 35 %
Lymphs Abs: 1.3 10*3/uL (ref 0.7–4.0)
MCH: 26.7 pg (ref 26.0–34.0)
MCHC: 32.4 g/dL (ref 30.0–36.0)
MCV: 82.6 fL (ref 78.0–100.0)
Monocytes Absolute: 0.3 10*3/uL (ref 0.1–1.0)
Monocytes Relative: 9 %
Neutro Abs: 1.9 10*3/uL (ref 1.7–7.7)
Neutrophils Relative %: 54 %
Platelets: 155 10*3/uL (ref 150–400)
RBC: 5.05 MIL/uL (ref 3.87–5.11)
RDW: 16.7 % — ABNORMAL HIGH (ref 11.5–15.5)
WBC: 3.6 10*3/uL — ABNORMAL LOW (ref 4.0–10.5)

## 2017-09-02 MED ORDER — CEPHALEXIN 500 MG PO CAPS: 500.0000 mg | ORAL_CAPSULE | Freq: Two times a day (BID) | ORAL | 0 refills | Status: AC

## 2017-09-02 MED ORDER — CEPHALEXIN 250 MG PO CAPS
500.0000 mg | ORAL_CAPSULE | Freq: Once | ORAL | Status: AC
  Administered 2017-09-02: 500 mg via ORAL
  Filled 2017-09-02: qty 2

## 2017-09-02 MED ORDER — SULFAMETHOXAZOLE-TRIMETHOPRIM 800-160 MG PO TABS: 1.0000 | ORAL_TABLET | Freq: Once | ORAL | Status: DC

## 2017-09-02 NOTE — Progress Notes (Signed)
*  Preliminary Results* Right lower extremity venous duplex completed. Right lower extremity is negative for deep vein thrombosis. There is no evidence of right Baker's cyst.  09/02/2017 5:21 PM  Maudry Mayhew, BS, RVT, RDCS, RDMS

## 2017-09-02 NOTE — ED Provider Notes (Signed)
[facility] DEPARTMENT Provider Note   CSN: [id number] Arrival date & time: 09/02/17  1149     History   Chief Complaint Chief Complaint  Patient presents with  . Leg Pain    HPI Patricia Randall is a 84 y.o. female.  HPI Presents with concern of pain in her leg. His focally in the posterior of the right distal calf, where there is an area of skin lesion. She notes that since suffering it about a week ago she has had increasing pain, discoloration, discomfort in the area. No dyspnea, no chest pain, no fever, no chills, no nausea, no vomiting. With concern for DVT she was referred here for evaluation. She notes that she has been  using topical antibiotic, though without appreciable change in her condition. Past Medical History:  Diagnosis Date  . Atrial fibrillation ([place])    a. 03/2013 s/p TEE/DCCV;  b. 03/2013 Eliquis initiated.  . Chronic systolic CHF (congestive heart failure) ([place])    a. 03/2013 Echo: EF 20-25%, diff HK, mild to mod MR, sev dil LA, mild RV dysfxn, sev dil RA, mod TR.  . Claudication ([place])   . Glaucoma   . High cholesterol   . Hypertension   . Type II diabetes mellitus [facility])     Patient Active Problem List   Diagnosis Date Noted  . Symptomatic anemia 05/24/2016  . GI bleed 05/24/2016  . Protein-calorie malnutrition, severe 07/05/2015  . Chronic kidney disease, stage IV (severe) ([place])   . Essential hypertension, benign 03/13/2014  . Chronic diastolic congestive heart failure ([place]) 04/12/2013  . Cardiomyopathy ([place]) 04/01/2013  . Ventricular tachycardia, non-sustained ([place]) 04/01/2013  . Permanent atrial fibrillation ([place]) 03/31/2013    Past Surgical History:  Procedure Laterality Date  . CARDIOVERSION N/A 04/02/2013   Procedure: CARDIOVERSION;  Surgeon: Thayer Headings, MD;  Location: [place];  Service: Cardiovascular;  Laterality: N/A;  Spoke with Tom   . ESOPHAGOGASTRODUODENOSCOPY N/A 05/26/2016   Procedure:  ESOPHAGOGASTRODUODENOSCOPY (EGD);  Surgeon: Ladene Artist, MD;  Location: [facility] ENDOSCOPY;  Service: Endoscopy;  Laterality: N/A;  . EYE SURGERY Right    "had blood in it"   . TEE WITHOUT CARDIOVERSION N/A 04/02/2013   Procedure: TRANSESOPHAGEAL ECHOCARDIOGRAM (TEE);  Surgeon: Thayer Headings, MD;  Location: [place];  Service: Cardiovascular;  Laterality: N/A;    OB History    No data available       Home Medications    Prior to Admission medications   Medication Sig Start Date End Date Taking? Authorizing Provider  amLODipine (NORVASC) 5 MG tablet TAKE 1 TABLET BY MOUTH EVERY DAY 03/26/16   Bensimhon, Shaune Pascal, MD  Biotin 10 MG CAPS Take 10 mg by mouth daily.    [provider]  carvedilol (COREG) 6.25 MG tablet TAKE 1 AND 1/2 TABLETS BY MOUTH TWICE A DAY WITH A MEAL 03/01/17   Bensimhon, Shaune Pascal, MD  cephALEXin (KEFLEX) 500 MG capsule Take 1 capsule (500 mg total) by mouth 2 (two) times daily for 5 days. 09/02/17 09/07/17  Carmin Muskrat, MD  cholecalciferol (VITAMIN D) 1000 units tablet Take 1,000 Units by mouth daily.    [provider]  ferrous sulfate 325 (65 FE) MG tablet Take 1 tablet (325 mg total) by mouth daily with breakfast. 05/27/16   Nita Sells, MD  furosemide (LASIX) 20 MG tablet Take 1 tablet (20 mg total) by mouth daily. 07/05/17   Fatima Blank, MD  glimepiride (AMARYL) 1 MG tablet  Take 1 mg by mouth daily before breakfast.    [provider]  latanoprost (XALATAN) 0.005 % ophthalmic solution Place 1 drop into both eyes at bedtime.    [provider]  lisinopril (PRINIVIL,ZESTRIL) 20 MG tablet Take 20 mg by mouth daily.    [provider]  lovastatin (MEVACOR) 40 MG tablet Take 40 mg by mouth at bedtime.    [provider]  traMADol (ULTRAM) 50 MG tablet Take 1 tablet (50 mg total) by mouth every 6 (six) hours as needed. Patient not taking: Reported on 05/24/2016 05/30/14   Harvie Heck, PA-C    traMADol (ULTRAM) 50 MG tablet Take 1 tablet (50 mg total) by mouth every 6 (six) hours as needed. Patient not taking: Reported on 05/24/2016 07/05/15   Veatrice Bourbon, MD  warfarin (COUMADIN) 2 MG tablet Take 1 tablet (2 mg total) by mouth daily. 05/31/16   Nita Sells, MD    Family History Family History  Problem Relation Age of Onset  . Other Mother        died in her 68's - 'just got sick.'  . Other Father        died @ 59, unknown cause.  . Diabetes Brother   . Other Unknown        12 siblings + 7 more 1/2 siblings.    Social History Social History   Tobacco Use  . Smoking status: Never Smoker  . Smokeless tobacco: Never Used  Substance Use Topics  . Alcohol use: No  . Drug use: No     Allergies   Patient has no known allergies.   Review of Systems Review of Systems  Constitutional:       Per HPI, otherwise negative  HENT:       Per HPI, otherwise negative  Respiratory:       Per HPI, otherwise negative  Cardiovascular:       Per HPI, otherwise negative  Gastrointestinal: Negative for vomiting.  Endocrine:       Negative aside from HPI  Genitourinary:       Neg aside from HPI   Musculoskeletal:       Per HPI, otherwise negative  Skin: Positive for wound.  Allergic/Immunologic: Positive for immunocompromised state.       Diabetic  Neurological: Negative for syncope.     Physical Exam Updated Vital Signs BP (!) 120/95 (BP Location: Left Arm)   Pulse 72   Temp 98.3 F (36.8 C) (Oral)   Resp 18   SpO2 99%   Physical Exam  Constitutional: She is oriented to person, place, and time. She appears well-developed and well-nourished. No distress.  HENT:  Head: Normocephalic and atraumatic.  Eyes: Conjunctivae and EOM are normal.  Cardiovascular: Normal rate and regular rhythm.  Pulmonary/Chest: Effort normal and breath sounds normal. No stridor. No respiratory distress.  Abdominal: She exhibits no distension.  Musculoskeletal: She exhibits  no edema or deformity.       Right ankle: Normal.       Left ankle: Normal.  Neurological: She is alert and oriented to person, place, and time. No cranial nerve deficit.  Skin: Skin is warm and dry.     Psychiatric: She has a normal mood and affect.  Nursing note and vitals reviewed.    ED Treatments / Results  Labs (all labs ordered are listed, but only abnormal results are displayed) Labs Reviewed  CBC WITH DIFFERENTIAL/PLATELET - Abnormal; Notable for the following components:  Result Value   WBC 3.6 (*)    RDW 16.7 (*)    All other components within normal limits  BASIC METABOLIC PANEL - Abnormal; Notable for the following components:   Glucose, Bld 148 (*)    BUN 44 (*)    Creatinine, Ser 1.79 (*)    GFR calc non Af Amer 25 (*)    GFR calc Af Amer 28 (*)    All other components within normal limits  URINALYSIS, ROUTINE W REFLEX MICROSCOPIC     Procedures Procedures (including critical care time)  Medications Ordered in ED Medications  cephALEXin (KEFLEX) capsule 500 mg (not administered)     Initial Impression / Assessment and Plan / ED Course  I have reviewed the triage vital signs and the nursing notes.  Pertinent labs & imaging results that were available during my care of the patient were reviewed by me and considered in my medical decision making (see chart for details).   This well-appearing female with diabetes presents with concern of a lesion on her right posterior calf. Some swelling, though minimal. Patient had ultrasound to exclude DVT, labs reassuring, with only mild glucose elevation, no evidence of bacteremia, sepsis.  Under suspicion for cellulitis. Patient use of topical salve was not healing the wound, patient started on Keflex, discharged in stable condition with ongoing antibiotic therapy and Epson salt baths.   Final Clinical Impressions(s) / ED Diagnoses   Final diagnoses:  Cellulitis of right lower extremity    ED Discharge  Orders        Ordered    cephALEXin (KEFLEX) 500 MG capsule  2 times daily     09/02/17 1927       Carmin Muskrat, MD 09/02/17 1934

## 2017-09-02 NOTE — ED Triage Notes (Signed)
Pt reports she has a mark to her right ankle, the patient has redness streaking up her ankle. When vitals were being collected the tech noted her HR to be fluctuating, EKG obtained and showed to provider. Denies any strange feelings to chest.

## 2017-09-02 NOTE — ED Provider Notes (Signed)
Patient placed in Quick Look pathway, seen and evaluated   Chief Complaint: Patient here with complaint of right calf and leg pain  HPI:   Patient has had a sore on the back of her leg for about 1 month.  She saw her PCP 3 weeks ago who offered an ointment and bandaging.  Over the past week she has had worsening pain redness and swelling in the leg.  ROS: Positive for pain and swelling in the right calf and ankle.  Negative for shortness of breath, chest pain, fevers or chills (one)  Physical Exam:   Gen: No distress  Neuro: Awake and Alert  Skin: Warm    Focused Exam: Patient with well-appearing ulceration to the posterior leg, the right leg is a bit more swollen than the left with some erythema and tenderness.  Calf is tender.   Initiation of care has begun. The patient has been counseled on the process, plan, and necessity for staying for the completion/evaluation, and the remainder of the medical screening examination    Margarita Mail, Hershal Coria 09/02/17 1717    Carmin Muskrat, MD 09/02/17 [phone]

## 2017-09-02 NOTE — Discharge Instructions (Addendum)
As discussed, today's evaluation has been generally reassuring. In addition to the prescribed antibiotic, please use Epson salt soaks for healing.  Be sure to follow-up with your physician or return here for concerning changes.

## 2017-09-17 ENCOUNTER — Encounter (HOSPITAL_COMMUNITY): Payer: Self-pay | Admitting: Student

## 2017-09-17 ENCOUNTER — Emergency Department (HOSPITAL_BASED_OUTPATIENT_CLINIC_OR_DEPARTMENT_OTHER): Admit: 2017-09-17 | Discharge: 2017-09-17 | Disposition: A | Discharge status: Home / Self Care | Payer: Medicare Other

## 2017-09-17 ENCOUNTER — Emergency Department (HOSPITAL_COMMUNITY)
Admission: EM | Admit: 2017-09-17 | Discharge: 2017-09-18 | Disposition: A | Discharge status: Home / Self Care | Payer: Medicare Other | Attending: Emergency Medicine | Admitting: Emergency Medicine

## 2017-09-17 DIAGNOSIS — L03115 Cellulitis of right lower limb: Secondary | ICD-10-CM | POA: Diagnosis not present

## 2017-09-17 DIAGNOSIS — R2241 Localized swelling, mass and lump, right lower limb: Secondary | ICD-10-CM | POA: Diagnosis present

## 2017-09-17 DIAGNOSIS — Z7901 Long term (current) use of anticoagulants: Secondary | ICD-10-CM | POA: Insufficient documentation

## 2017-09-17 DIAGNOSIS — E119 Type 2 diabetes mellitus without complications: Secondary | ICD-10-CM | POA: Diagnosis not present

## 2017-09-17 DIAGNOSIS — Z79899 Other long term (current) drug therapy: Secondary | ICD-10-CM | POA: Insufficient documentation

## 2017-09-17 DIAGNOSIS — Z23 Encounter for immunization: Secondary | ICD-10-CM | POA: Diagnosis not present

## 2017-09-17 DIAGNOSIS — R609 Edema, unspecified: Secondary | ICD-10-CM | POA: Diagnosis not present

## 2017-09-17 DIAGNOSIS — Z7984 Long term (current) use of oral hypoglycemic drugs: Secondary | ICD-10-CM | POA: Insufficient documentation

## 2017-09-17 DIAGNOSIS — I110 Hypertensive heart disease with heart failure: Secondary | ICD-10-CM | POA: Diagnosis not present

## 2017-09-17 DIAGNOSIS — I5022 Chronic systolic (congestive) heart failure: Secondary | ICD-10-CM | POA: Insufficient documentation

## 2017-09-17 LAB — BASIC METABOLIC PANEL
Anion gap: 10 (ref 5–15)
BUN: 44 mg/dL — ABNORMAL HIGH (ref 6–20)
CHLORIDE: 106 mmol/L (ref 101–111)
CO2: 24 mmol/L (ref 22–32)
Calcium: 8.8 mg/dL — ABNORMAL LOW (ref 8.9–10.3)
Creatinine, Ser: 1.84 mg/dL — ABNORMAL HIGH (ref 0.44–1.00)
GFR calc Af Amer: 27 mL/min — ABNORMAL LOW (ref >60)
GFR calc non Af Amer: 24 mL/min — ABNORMAL LOW (ref >60)
GLUCOSE: 114 mg/dL — AB (ref 65–99)
POTASSIUM: 4 mmol/L (ref 3.5–5.1)
Sodium: 140 mmol/L (ref 135–145)

## 2017-09-17 LAB — CBC WITH DIFFERENTIAL/PLATELET
Basophils Absolute: 0 10*3/uL (ref 0.0–0.1)
Basophils Relative: 0 %
Eosinophils Absolute: 0.1 10*3/uL (ref 0.0–0.7)
Eosinophils Relative: 2 %
HEMATOCRIT: 40.5 % (ref 36.0–46.0)
HEMOGLOBIN: 12.9 g/dL (ref 12.0–15.0)
LYMPHS ABS: 1.5 10*3/uL (ref 0.7–4.0)
LYMPHS PCT: 36 %
MCH: 26.5 pg (ref 26.0–34.0)
MCHC: 31.9 g/dL (ref 30.0–36.0)
MCV: 83.3 fL (ref 78.0–100.0)
Monocytes Absolute: 0.3 10*3/uL (ref 0.1–1.0)
Monocytes Relative: 7 %
Neutro Abs: 2.2 10*3/uL (ref 1.7–7.7)
Neutrophils Relative %: 55 %
Platelets: 188 10*3/uL (ref 150–400)
RBC: 4.86 MIL/uL (ref 3.87–5.11)
RDW: 16.9 % — ABNORMAL HIGH (ref 11.5–15.5)
WBC: 4.1 10*3/uL (ref 4.0–10.5)

## 2017-09-17 LAB — PROTIME-INR
INR: 3.09
Prothrombin Time: 31.6 seconds — ABNORMAL HIGH (ref 11.4–15.2)

## 2017-09-17 MED ORDER — VANCOMYCIN HCL IN DEXTROSE 1-5 GM/200ML-% IV SOLN
1000.0000 mg | Freq: Once | INTRAVENOUS | Status: AC
  Administered 2017-09-18: 1000 mg via INTRAVENOUS
  Filled 2017-09-17: qty 200

## 2017-09-17 MED ORDER — ONDANSETRON HCL 4 MG/2ML IJ SOLN
4.0000 mg | Freq: Once | INTRAMUSCULAR | Status: AC
  Administered 2017-09-18: 4 mg via INTRAVENOUS
  Filled 2017-09-17: qty 2

## 2017-09-17 MED ORDER — FENTANYL CITRATE (PF) 100 MCG/2ML IJ SOLN
50.0000 ug | Freq: Once | INTRAMUSCULAR | Status: AC
Start: 2017-09-17 — End: 2017-09-18
  Administered 2017-09-18: 50 ug via INTRAVENOUS
  Filled 2017-09-17: qty 2

## 2017-09-17 NOTE — ED Provider Notes (Signed)
TIME SEEN: 11:30 PM  CHIEF COMPLAINT: Right leg swelling and redness  HPI: Patient is an 84 year old female with history of atrial fibrillation on Coumadin, CHF, hypertension, diabetes, hyperlipidemia who presents to the emergency department with increasing right leg swelling and redness.  Unfortunately patient and family are both very poor historians.  Patient states that she feels like she is having some increasing pain and may be a little bit of increasing swelling but she is unable to tell me if the redness is worse.  She states she was seen by her PCP who thought things were getting better.  She was seen in the emergency department on March 15 and had a negative venous Doppler of her leg and was started on Keflex for presumed cellulitis.  Her diabetes has been well controlled.  No fevers or chills.  No nausea or vomiting.  No chest pain or shortness of breath.  ROS: See HPI Constitutional: no fever  Eyes: no drainage  ENT: no runny nose   Cardiovascular:  no chest pain  Resp: no SOB  GI: no vomiting GU: no dysuria Integumentary: no rash  Allergy: no hives  Musculoskeletal: Right leg swelling  Neurological: no slurred speech ROS otherwise negative  PAST MEDICAL HISTORY/PAST SURGICAL HISTORY:  Past Medical History:  Diagnosis Date  . Atrial fibrillation ([place])    a. 03/2013 s/p TEE/DCCV;  b. 03/2013 Eliquis initiated.  . Chronic systolic CHF (congestive heart failure) ([place])    a. 03/2013 Echo: EF 20-25%, diff HK, mild to mod MR, sev dil LA, mild RV dysfxn, sev dil RA, mod TR.  . Claudication ([place])   . Glaucoma   . High cholesterol   . Hypertension   . Type II diabetes mellitus ([facility])     MEDICATIONS:  Prior to Admission medications   Medication Sig Start Date End Date Taking? Authorizing Provider  amLODipine (NORVASC) 5 MG tablet TAKE 1 TABLET BY MOUTH EVERY DAY 03/26/16   Bensimhon, Shaune Pascal, MD  Biotin 10 MG CAPS Take 10 mg by mouth daily.    [provider]   carvedilol (COREG) 6.25 MG tablet TAKE 1 AND 1/2 TABLETS BY MOUTH TWICE A DAY WITH A MEAL 03/01/17   Bensimhon, Shaune Pascal, MD  cholecalciferol (VITAMIN D) 1000 units tablet Take 1,000 Units by mouth daily.    [provider]  ferrous sulfate 325 (65 FE) MG tablet Take 1 tablet (325 mg total) by mouth daily with breakfast. 05/27/16   Nita Sells, MD  furosemide (LASIX) 20 MG tablet Take 1 tablet (20 mg total) by mouth daily. 07/05/17   Cardama, Grayce Sessions, MD  glimepiride (AMARYL) 1 MG tablet Take 1 mg by mouth daily before breakfast.    [provider]  latanoprost (XALATAN) 0.005 % ophthalmic solution Place 1 drop into both eyes at bedtime.    [provider]  lisinopril (PRINIVIL,ZESTRIL) 20 MG tablet Take 20 mg by mouth daily.    [provider]  lovastatin (MEVACOR) 40 MG tablet Take 40 mg by mouth at bedtime.    [provider]  traMADol (ULTRAM) 50 MG tablet Take 1 tablet (50 mg total) by mouth every 6 (six) hours as needed. Patient not taking: Reported on 05/24/2016 05/30/14   Harvie Heck, PA-C  traMADol (ULTRAM) 50 MG tablet Take 1 tablet (50 mg total) by mouth every 6 (six) hours as needed. Patient not taking: Reported on 05/24/2016 07/05/15   Veatrice Bourbon, MD  warfarin (COUMADIN) 2 MG tablet Take 1  tablet (2 mg total) by mouth daily. 05/31/16   Nita Sells, MD    ALLERGIES:  No Known Allergies  SOCIAL HISTORY:  Social History   Tobacco Use  . Smoking status: Never Smoker  . Smokeless tobacco: Never Used  Substance Use Topics  . Alcohol use: No    FAMILY HISTORY: Family History  Problem Relation Age of Onset  . Other Mother        died in her 36's - 'just got sick.'  . Other Father        died @ 30, unknown cause.  . Diabetes Brother   . Other Unknown        12 siblings + 7 more 1/2 siblings.    EXAM: BP (!) 128/111 (BP Location: Left Arm)   Pulse 79   Temp 98.1 F (36.7 C) (Oral)   Resp 18    SpO2 100%  CONSTITUTIONAL: Alert and oriented and responds appropriately to questions intermittently but is a very poor historian.  Chronically ill-appearing.  In no distress.  Afebrile.  Nontoxic. HEAD: Normocephalic EYES: Conjunctivae clear, pupils appear equal, EOMI ENT: normal nose; moist mucous membranes NECK: Supple, no meningismus, no nuchal rigidity, no LAD  CARD: RRR; S1 and S2 appreciated; no murmurs, no clicks, no rubs, no gallops RESP: Normal chest excursion without splinting or tachypnea; breath sounds clear and equal bilaterally; no wheezes, no rhonchi, no rales, no hypoxia or respiratory distress, speaking full sentences ABD/GI: Normal bowel sounds; non-distended; soft, non-tender, no rebound, no guarding, no peritoneal signs, no hepatosplenomegaly BACK:  The back appears normal and is non-tender to palpation, there is no CVA tenderness EXT: Patient has swelling in the right lower extremity with nonpitting edema compared to the left.  She has tenderness in the posterior right calf with associated redness and 2 superficial lesions without drainage.  There is some induration of the calf.  There is no fluctuance or drainage noted.  Leg is not warm.  She does have 2+ palpable DP pulses bilaterally.  No bony tenderness or bony abnormality.  No subcutaneous emphysema noted.  Compartments are soft.  Reports normal sensation in both legs.   No joint effusion.  Full range of motion in all joints.   SKIN: Has redness to the posterior right calf without any warmth.  There is an area of induration but there is no fluctuance.  2 superficial healing wounds to the posterior calf but there is no drainage or bleeding.-. NEURO: Moves all extremities equally PSYCH: The patient's mood and manner are appropriate. Grooming and personal hygiene are appropriate.  MEDICAL DECISION MAKING: Patient here with cellulitis.  It is difficult to determine if she feels like this is getting worse or not.  Her labs here  are reassuring.  She has no leukocytosis.  Blood glucose is 114.  She has history of chronic kidney disease and her creatinine is stable.  Her INR is therapeutic.  Triage staff repeated venous Doppler which again showed no DVT.  Nothing to suggest arterial obstruction at this time.  No signs of septic arthritis.  No signs of gout.  No signs of compartment syndrome.  I have offered her admission but she declines.  I feel is reasonable to put her on Bactrim as this may be MRSA causing her symptoms.  Will give dose of IV vancomycin here and pain medication.  She has been able to ambulate.  Anticipate discharge home on Bactrim with close follow-up with her primary care provider.  She does  not appear septic or bacteremic.  She does not have systemic symptoms at this time.  ED PROGRESS: Patient has been able to ambulate without difficulty.  Pain is improved.  Received dose of vancomycin in the ED.  We have outlined area of redness with tissue marker.  Have again offered her admission which she refuses.  She feels comfortable with plan to be discharged home on Bactrim and follow-up with her primary care physician.  At this time, I do not feel there is any life-threatening condition present. I have reviewed and discussed all results (EKG, imaging, lab, urine as appropriate) and exam findings with patient/family. I have reviewed nursing notes and appropriate previous records.  I feel the patient is safe to be discharged home without further emergent workup and can continue workup as an outpatient as needed. Discussed usual and customary return precautions. Patient/family verbalize understanding and are comfortable with this plan.  Outpatient follow-up has been provided if needed. All questions have been answered.      Ward, Delice Bison, DO 09/18/17 [phone]

## 2017-09-17 NOTE — Progress Notes (Signed)
Right lower extremity venous duplex completed. There is no evidence of a DVT, superficial thrombosis, or Baker's cyst. Mild to moderate interstitial fluid noted in the mid to distal lower leg. There is no change from the study completed on 09/02/2017. [facility], [address] 09/17/2017 4:35 PM

## 2017-09-17 NOTE — ED Triage Notes (Signed)
Patient complains of ongoing right lower leg redness, swelling and soreness x 2 weeks. Seen in ED previously for same and negative DVT and took antibiotics as prescribed

## 2017-09-17 NOTE — ED Provider Notes (Signed)
Patient placed in Quick Look pathway, seen and evaluated   Chief Complaint: RLE redness and swelling  HPI:   Patient returns to the ED with worsened RLE redness and swelling. Seen in the ED 03/15 for RLE pain/swelling/redness- had negative [place] for DVT, she was discharged home on Keflex for cellulitis which she states she has taken as prescribed. Swelling and redness seem worse, pain is about the same. Denies fevers.   ROS: Positive for RLE pain/swelling/redness. Negative for fevers.   Physical Exam:   Gen: No distress  Neuro: Awake and Alert  Skin: Warm    Focused Exam: RLE 1+ pitting edema to the knee. There is a small wound to the posterior aspect of the lower leg with erythema and warmth to the calf. The calf is tender to palpation. 2+ DP pulse.   Given worsened sxs will obtain basic blood work and repeat US for DVT.   Initiation of care has begun. The patient has been counseled on the process, plan, and necessity for staying for the completion/evaluation, and the remainder of the medical screening examination   Amaryllis Dyke, PA-C 09/17/17 1455    Valarie Merino, MD 09/17/17 2210

## 2017-09-18 MED ORDER — SULFAMETHOXAZOLE-TRIMETHOPRIM 800-160 MG PO TABS: 1.0000 | ORAL_TABLET | Freq: Two times a day (BID) | ORAL | 0 refills | Status: AC

## 2017-09-18 MED ORDER — TETANUS-DIPHTH-ACELL PERTUSSIS 5-2.5-18.5 LF-MCG/0.5 IM SUSP
0.5000 mL | Freq: Once | INTRAMUSCULAR | Status: AC
  Administered 2017-09-18: 0.5 mL via INTRAMUSCULAR
  Filled 2017-09-18: qty 0.5

## 2017-09-18 NOTE — Discharge Instructions (Signed)
You may take Tylenol 1000 mg every 6 hours as needed for pain.  Please call and make an appointment with your primary care physician for close follow-up.  Your labs today were normal other than chronic kidney disease which is stable for you.  Repeat ultrasound did not show any sign of abscess or blood clot.

## 2017-09-18 NOTE — ED Notes (Signed)
Pt. Ambulated without assistance to nurses station and back with steady gait.

## 2017-09-26 ENCOUNTER — Emergency Department (HOSPITAL_BASED_OUTPATIENT_CLINIC_OR_DEPARTMENT_OTHER)
Admit: 2017-09-26 | Discharge: 2017-09-26 | Disposition: A | Discharge status: Home / Self Care | Payer: Medicare Other | Attending: Emergency Medicine | Admitting: Emergency Medicine

## 2017-09-26 ENCOUNTER — Observation Stay (HOSPITAL_COMMUNITY)
Admission: EM | Admit: 2017-09-26 | Discharge: 2017-09-30 | Disposition: A | Discharge status: Home / Self Care | Payer: Medicare Other | Attending: Internal Medicine | Admitting: Internal Medicine

## 2017-09-26 ENCOUNTER — Emergency Department (HOSPITAL_COMMUNITY): Payer: Medicare Other

## 2017-09-26 ENCOUNTER — Observation Stay (HOSPITAL_COMMUNITY): Payer: Medicare Other

## 2017-09-26 ENCOUNTER — Encounter (HOSPITAL_COMMUNITY): Payer: Self-pay | Admitting: *Deleted

## 2017-09-26 ENCOUNTER — Other Ambulatory Visit: Payer: Self-pay

## 2017-09-26 DIAGNOSIS — I481 Persistent atrial fibrillation: Secondary | ICD-10-CM | POA: Diagnosis not present

## 2017-09-26 DIAGNOSIS — L97519 Non-pressure chronic ulcer of other part of right foot with unspecified severity: Secondary | ICD-10-CM | POA: Diagnosis not present

## 2017-09-26 DIAGNOSIS — E43 Unspecified severe protein-calorie malnutrition: Secondary | ICD-10-CM | POA: Insufficient documentation

## 2017-09-26 DIAGNOSIS — L03116 Cellulitis of left lower limb: Secondary | ICD-10-CM | POA: Insufficient documentation

## 2017-09-26 DIAGNOSIS — Z833 Family history of diabetes mellitus: Secondary | ICD-10-CM | POA: Insufficient documentation

## 2017-09-26 DIAGNOSIS — N184 Chronic kidney disease, stage 4 (severe): Secondary | ICD-10-CM | POA: Insufficient documentation

## 2017-09-26 DIAGNOSIS — Z7901 Long term (current) use of anticoagulants: Secondary | ICD-10-CM | POA: Insufficient documentation

## 2017-09-26 DIAGNOSIS — E1151 Type 2 diabetes mellitus with diabetic peripheral angiopathy without gangrene: Secondary | ICD-10-CM | POA: Diagnosis not present

## 2017-09-26 DIAGNOSIS — I5043 Acute on chronic combined systolic (congestive) and diastolic (congestive) heart failure: Secondary | ICD-10-CM | POA: Insufficient documentation

## 2017-09-26 DIAGNOSIS — E11621 Type 2 diabetes mellitus with foot ulcer: Secondary | ICD-10-CM | POA: Diagnosis not present

## 2017-09-26 DIAGNOSIS — E1122 Type 2 diabetes mellitus with diabetic chronic kidney disease: Secondary | ICD-10-CM | POA: Diagnosis not present

## 2017-09-26 DIAGNOSIS — I472 Ventricular tachycardia: Secondary | ICD-10-CM | POA: Insufficient documentation

## 2017-09-26 DIAGNOSIS — N183 Chronic kidney disease, stage 3 unspecified: Secondary | ICD-10-CM | POA: Diagnosis present

## 2017-09-26 DIAGNOSIS — I482 Chronic atrial fibrillation: Secondary | ICD-10-CM | POA: Insufficient documentation

## 2017-09-26 DIAGNOSIS — M79604 Pain in right leg: Secondary | ICD-10-CM | POA: Diagnosis not present

## 2017-09-26 DIAGNOSIS — I5021 Acute systolic (congestive) heart failure: Secondary | ICD-10-CM

## 2017-09-26 DIAGNOSIS — L03115 Cellulitis of right lower limb: Secondary | ICD-10-CM | POA: Diagnosis not present

## 2017-09-26 DIAGNOSIS — I739 Peripheral vascular disease, unspecified: Secondary | ICD-10-CM | POA: Diagnosis not present

## 2017-09-26 DIAGNOSIS — I5032 Chronic diastolic (congestive) heart failure: Secondary | ICD-10-CM | POA: Diagnosis not present

## 2017-09-26 DIAGNOSIS — I509 Heart failure, unspecified: Secondary | ICD-10-CM

## 2017-09-26 DIAGNOSIS — M7989 Other specified soft tissue disorders: Secondary | ICD-10-CM

## 2017-09-26 DIAGNOSIS — M79609 Pain in unspecified limb: Secondary | ICD-10-CM | POA: Diagnosis not present

## 2017-09-26 DIAGNOSIS — I130 Hypertensive heart and chronic kidney disease with heart failure and stage 1 through stage 4 chronic kidney disease, or unspecified chronic kidney disease: Secondary | ICD-10-CM | POA: Diagnosis not present

## 2017-09-26 DIAGNOSIS — Z79899 Other long term (current) drug therapy: Secondary | ICD-10-CM | POA: Insufficient documentation

## 2017-09-26 DIAGNOSIS — M6281 Muscle weakness (generalized): Secondary | ICD-10-CM | POA: Insufficient documentation

## 2017-09-26 DIAGNOSIS — F329 Major depressive disorder, single episode, unspecified: Secondary | ICD-10-CM | POA: Diagnosis not present

## 2017-09-26 DIAGNOSIS — I429 Cardiomyopathy, unspecified: Secondary | ICD-10-CM | POA: Diagnosis not present

## 2017-09-26 DIAGNOSIS — E7800 Pure hypercholesterolemia, unspecified: Secondary | ICD-10-CM | POA: Insufficient documentation

## 2017-09-26 DIAGNOSIS — L97212 Non-pressure chronic ulcer of right calf with fat layer exposed: Secondary | ICD-10-CM

## 2017-09-26 DIAGNOSIS — H409 Unspecified glaucoma: Secondary | ICD-10-CM | POA: Insufficient documentation

## 2017-09-26 DIAGNOSIS — L02416 Cutaneous abscess of left lower limb: Secondary | ICD-10-CM | POA: Insufficient documentation

## 2017-09-26 DIAGNOSIS — I081 Rheumatic disorders of both mitral and tricuspid valves: Secondary | ICD-10-CM | POA: Insufficient documentation

## 2017-09-26 DIAGNOSIS — I10 Essential (primary) hypertension: Secondary | ICD-10-CM | POA: Diagnosis not present

## 2017-09-26 DIAGNOSIS — I4821 Permanent atrial fibrillation: Secondary | ICD-10-CM | POA: Diagnosis present

## 2017-09-26 DIAGNOSIS — I420 Dilated cardiomyopathy: Secondary | ICD-10-CM

## 2017-09-26 DIAGNOSIS — Z794 Long term (current) use of insulin: Secondary | ICD-10-CM | POA: Insufficient documentation

## 2017-09-26 DIAGNOSIS — D631 Anemia in chronic kidney disease: Secondary | ICD-10-CM | POA: Diagnosis not present

## 2017-09-26 DIAGNOSIS — E785 Hyperlipidemia, unspecified: Secondary | ICD-10-CM | POA: Diagnosis not present

## 2017-09-26 DIAGNOSIS — N179 Acute kidney failure, unspecified: Secondary | ICD-10-CM | POA: Diagnosis not present

## 2017-09-26 DIAGNOSIS — I5023 Acute on chronic systolic (congestive) heart failure: Secondary | ICD-10-CM | POA: Diagnosis not present

## 2017-09-26 DIAGNOSIS — I872 Venous insufficiency (chronic) (peripheral): Secondary | ICD-10-CM

## 2017-09-26 LAB — CBC
HEMATOCRIT: 39.3 % (ref 36.0–46.0)
HEMOGLOBIN: 12.8 g/dL (ref 12.0–15.0)
MCH: 26.9 pg (ref 26.0–34.0)
MCHC: 32.6 g/dL (ref 30.0–36.0)
MCV: 82.6 fL (ref 78.0–100.0)
Platelets: 215 10*3/uL (ref 150–400)
RBC: 4.76 MIL/uL (ref 3.87–5.11)
RDW: 17 % — ABNORMAL HIGH (ref 11.5–15.5)
WBC: 4.1 10*3/uL (ref 4.0–10.5)

## 2017-09-26 LAB — COMPREHENSIVE METABOLIC PANEL
ALBUMIN: 3.1 g/dL — AB (ref 3.5–5.0)
ALK PHOS: 74 U/L (ref 38–126)
ALT: 21 U/L (ref 14–54)
AST: 23 U/L (ref 15–41)
Anion gap: 13 (ref 5–15)
BUN: 44 mg/dL — ABNORMAL HIGH (ref 6–20)
CALCIUM: 9.3 mg/dL (ref 8.9–10.3)
CHLORIDE: 103 mmol/L (ref 101–111)
CO2: 22 mmol/L (ref 22–32)
CREATININE: 2.22 mg/dL — AB (ref 0.44–1.00)
GFR calc non Af Amer: 19 mL/min — ABNORMAL LOW (ref >60)
GFR, EST AFRICAN AMERICAN: 22 mL/min — AB (ref >60)
GLUCOSE: 163 mg/dL — AB (ref 65–99)
Potassium: 4.6 mmol/L (ref 3.5–5.1)
SODIUM: 138 mmol/L (ref 135–145)
Total Bilirubin: 0.5 mg/dL (ref 0.3–1.2)
Total Protein: 6.4 g/dL — ABNORMAL LOW (ref 6.5–8.1)

## 2017-09-26 LAB — I-STAT TROPONIN, ED: Troponin i, poc: 0 ng/mL (ref 0.00–0.08)

## 2017-09-26 LAB — GLUCOSE, CAPILLARY: GLUCOSE-CAPILLARY: 71 mg/dL (ref 65–99)

## 2017-09-26 LAB — PROTIME-INR
INR: 2.55
Prothrombin Time: 27.2 seconds — ABNORMAL HIGH (ref 11.4–15.2)

## 2017-09-26 LAB — BRAIN NATRIURETIC PEPTIDE: B NATRIURETIC PEPTIDE 5: 585.3 pg/mL — AB (ref 0.0–100.0)

## 2017-09-26 MED ORDER — TRAZODONE HCL 50 MG PO TABS
25.0000 mg | ORAL_TABLET | Freq: Every evening | ORAL | Status: DC | PRN
  Filled 2017-09-26: qty 1

## 2017-09-26 MED ORDER — TRAMADOL HCL 50 MG PO TABS: 50.0000 mg | ORAL_TABLET | Freq: Four times a day (QID) | ORAL | Status: DC | PRN

## 2017-09-26 MED ORDER — SODIUM CHLORIDE 0.9% FLUSH
3.0000 mL | Freq: Two times a day (BID) | INTRAVENOUS | Status: DC
  Administered 2017-09-26 – 2017-09-30 (×8): 3 mL via INTRAVENOUS

## 2017-09-26 MED ORDER — AMLODIPINE BESYLATE 5 MG PO TABS
5.0000 mg | ORAL_TABLET | Freq: Every day | ORAL | Status: DC
  Administered 2017-09-26 – 2017-09-27 (×2): 5 mg via ORAL
  Filled 2017-09-26 (×2): qty 1

## 2017-09-26 MED ORDER — PRAVASTATIN SODIUM 40 MG PO TABS: 40.0000 mg | ORAL_TABLET | Freq: Every day | ORAL | Status: DC

## 2017-09-26 MED ORDER — BISACODYL 5 MG PO TBEC: 5.0000 mg | DELAYED_RELEASE_TABLET | Freq: Every day | ORAL | Status: DC | PRN

## 2017-09-26 MED ORDER — ACETAMINOPHEN 650 MG RE SUPP: 650.0000 mg | Freq: Four times a day (QID) | RECTAL | Status: DC | PRN

## 2017-09-26 MED ORDER — ALBUTEROL SULFATE (2.5 MG/3ML) 0.083% IN NEBU: 2.5000 mg | INHALATION_SOLUTION | RESPIRATORY_TRACT | Status: DC | PRN

## 2017-09-26 MED ORDER — INSULIN ASPART 100 UNIT/ML ~~LOC~~ SOLN
0.0000 [IU] | Freq: Three times a day (TID) | SUBCUTANEOUS | Status: DC
  Administered 2017-09-27: 1 [IU] via SUBCUTANEOUS
  Administered 2017-09-27: 2 [IU] via SUBCUTANEOUS
  Administered 2017-09-28 (×3): 1 [IU] via SUBCUTANEOUS
  Administered 2017-09-29: 3 [IU] via SUBCUTANEOUS
  Administered 2017-09-29 – 2017-09-30 (×2): 1 [IU] via SUBCUTANEOUS
  Administered 2017-09-30: 2 [IU] via SUBCUTANEOUS

## 2017-09-26 MED ORDER — GLUCERNA SHAKE PO LIQD
237.0000 mL | ORAL | Status: DC
  Administered 2017-09-28 – 2017-09-29 (×2): 237 mL via ORAL

## 2017-09-26 MED ORDER — LATANOPROST 0.005 % OP SOLN
1.0000 [drp] | Freq: Every day | OPHTHALMIC | Status: DC
  Administered 2017-09-26 – 2017-09-29 (×4): 1 [drp] via OPHTHALMIC
  Filled 2017-09-26: qty 2.5

## 2017-09-26 MED ORDER — FUROSEMIDE 10 MG/ML IJ SOLN
40.0000 mg | Freq: Two times a day (BID) | INTRAMUSCULAR | Status: DC
  Administered 2017-09-26 – 2017-09-28 (×3): 40 mg via INTRAVENOUS
  Filled 2017-09-26 (×4): qty 4

## 2017-09-26 MED ORDER — HYDRALAZINE HCL 20 MG/ML IJ SOLN: 10.0000 mg | Freq: Three times a day (TID) | INTRAMUSCULAR | Status: DC | PRN

## 2017-09-26 MED ORDER — ONDANSETRON HCL 4 MG PO TABS
4.0000 mg | ORAL_TABLET | Freq: Four times a day (QID) | ORAL | Status: DC | PRN
  Administered 2017-09-28 – 2017-09-30 (×2): 4 mg via ORAL
  Filled 2017-09-26 (×2): qty 1

## 2017-09-26 MED ORDER — ACETAMINOPHEN 325 MG PO TABS: 650.0000 mg | ORAL_TABLET | Freq: Four times a day (QID) | ORAL | Status: DC | PRN

## 2017-09-26 MED ORDER — PRAVASTATIN SODIUM 40 MG PO TABS
40.0000 mg | ORAL_TABLET | Freq: Every day | ORAL | Status: DC
  Administered 2017-09-27 – 2017-09-29 (×3): 40 mg via ORAL
  Filled 2017-09-26 (×3): qty 1

## 2017-09-26 MED ORDER — DOXYCYCLINE HYCLATE 100 MG PO TABS
100.0000 mg | ORAL_TABLET | Freq: Two times a day (BID) | ORAL | Status: DC
  Administered 2017-09-26 – 2017-09-30 (×8): 100 mg via ORAL
  Filled 2017-09-26 (×8): qty 1

## 2017-09-26 MED ORDER — WARFARIN - PHARMACIST DOSING INPATIENT
Freq: Every day | Status: DC
  Administered 2017-09-27 – 2017-09-29 (×2)

## 2017-09-26 MED ORDER — POLYETHYLENE GLYCOL 3350 17 G PO PACK: 17.0000 g | PACK | Freq: Every day | ORAL | Status: DC | PRN

## 2017-09-26 MED ORDER — ONDANSETRON HCL 4 MG/2ML IJ SOLN: 4.0000 mg | Freq: Four times a day (QID) | INTRAMUSCULAR | Status: DC | PRN

## 2017-09-26 MED ORDER — VITAMIN D 1000 UNITS PO TABS
1000.0000 [IU] | ORAL_TABLET | Freq: Every day | ORAL | Status: DC
  Administered 2017-09-27 – 2017-09-30 (×4): 1000 [IU] via ORAL
  Filled 2017-09-26 (×6): qty 1

## 2017-09-26 MED ORDER — FUROSEMIDE 10 MG/ML IJ SOLN
20.0000 mg | Freq: Once | INTRAMUSCULAR | Status: AC
  Administered 2017-09-26: 20 mg via INTRAVENOUS
  Filled 2017-09-26: qty 2

## 2017-09-26 MED ORDER — CARVEDILOL 12.5 MG PO TABS: 6.2500 mg | ORAL_TABLET | Freq: Two times a day (BID) | ORAL | Status: DC

## 2017-09-26 NOTE — ED Provider Notes (Signed)
[facility] EMERGENCY DEPARTMENT Provider Note   CSN: [id number] Arrival date & time: 09/26/17  1004     History   Chief Complaint Chief Complaint  Patient presents with  . Leg Pain  . Shortness of Breath    HPI Patricia Randall is a 84 y.o. female.  The history is provided by the patient and a relative. No language interpreter was used.  Leg Pain    Shortness of Breath  Associated symptoms include leg pain.    Patricia Randall is a 84 y.o. female who presents to the Emergency Department complaining of sob, leg pain.  She reports sob and orthopnea for the last week.  Denies any chest pain, cough, fever.  She has pain in the right leg for the last three to four weeks.  She is unsure if there was an injury to the back of her leg a few weeks ago.  She reports redness to the leg for the last week.  Sxs are moderate, constant, worsening.   She saw her doctor a week ago and her lisinopril, amlodipine and abx were discontinued.   Past Medical History:  Diagnosis Date  . Atrial fibrillation ([place])    a. 03/2013 s/p TEE/DCCV;  b. 03/2013 Eliquis initiated.  . Chronic systolic CHF (congestive heart failure) ([place])    a. 03/2013 Echo: EF 20-25%, diff HK, mild to mod MR, sev dil LA, mild RV dysfxn, sev dil RA, mod TR.  . Claudication ([place])   . Glaucoma   . High cholesterol   . Hypertension   . Type II diabetes mellitus [facility])     Patient Active Problem List   Diagnosis Date Noted  . CHF (congestive heart failure) ([place]) 09/26/2017  . Symptomatic anemia 05/24/2016  . GI bleed 05/24/2016  . Protein-calorie malnutrition, severe 07/05/2015  . Chronic kidney disease, stage IV (severe) ([place])   . Essential hypertension, benign 03/13/2014  . Chronic diastolic congestive heart failure ([place]) 04/12/2013  . Cardiomyopathy ([place]) 04/01/2013  . Ventricular tachycardia, non-sustained ([place]) 04/01/2013  . Permanent atrial fibrillation ([place]) 03/31/2013    Past Surgical History:  Procedure  Laterality Date  . CARDIOVERSION N/A 04/02/2013   Procedure: CARDIOVERSION;  Surgeon: Thayer Headings, MD;  Location: [facility];  Service: Cardiovascular;  Laterality: N/A;  Spoke with Tom   . ESOPHAGOGASTRODUODENOSCOPY N/A 05/26/2016   Procedure: ESOPHAGOGASTRODUODENOSCOPY (EGD);  Surgeon: Ladene Artist, MD;  Location: [facility];  Service: Endoscopy;  Laterality: N/A;  . EYE SURGERY Right    "had blood in it"   . TEE WITHOUT CARDIOVERSION N/A 04/02/2013   Procedure: TRANSESOPHAGEAL ECHOCARDIOGRAM (TEE);  Surgeon: Thayer Headings, MD;  Location: [place];  Service: Cardiovascular;  Laterality: N/A;     OB History   None      Home Medications    Prior to Admission medications   Medication Sig Start Date End Date Taking? Authorizing Provider  Biotin 10 MG CAPS Take 10 mg by mouth daily.   Yes [provider]  carvedilol (COREG) 6.25 MG tablet TAKE 1 AND 1/2 TABLETS BY MOUTH TWICE A DAY WITH A MEAL 03/01/17  Yes Bensimhon, Shaune Pascal, MD  cholecalciferol (VITAMIN D) 1000 units tablet Take 1,000 Units by mouth daily.   Yes [provider]  feeding supplement, GLUCERNA SHAKE, (GLUCERNA SHAKE) LIQD Take 237 mLs by mouth daily.   Yes [provider]  furosemide (LASIX) 20 MG tablet Take 1 tablet (20 mg total) by mouth daily. 07/05/17  Yes Cardama,  Grayce Sessions, MD  glimepiride (AMARYL) 4 MG tablet Take 6-8 mg by mouth daily before breakfast.    Yes [provider]  latanoprost (XALATAN) 0.005 % ophthalmic solution Place 1 drop into both eyes at bedtime.   Yes [provider]  lovastatin (MEVACOR) 40 MG tablet Take 40 mg by mouth at bedtime.   Yes [provider]  warfarin (COUMADIN) 2 MG tablet Take 1 tablet (2 mg total) by mouth daily. Patient taking differently: Take 1-2 mg by mouth daily. Take 2 mg for two days then 1 mg for a day then 2 mg for the next two days then 1 mg. 05/31/16  Yes Nita Sells, MD  lisinopril  (PRINIVIL,ZESTRIL) 40 MG tablet Take 40 mg by mouth daily. 09/13/17   [provider]    Family History Family History  Problem Relation Age of Onset  . Other Mother        died in her 108's - 'just got sick.'  . Other Father        died @ 61, unknown cause.  . Diabetes Brother   . Other Unknown        12 siblings + 7 more 1/2 siblings.    Social History Social History   Tobacco Use  . Smoking status: Never Smoker  . Smokeless tobacco: Never Used  Substance Use Topics  . Alcohol use: No  . Drug use: No     Allergies   Patient has no known allergies.   Review of Systems Review of Systems  Respiratory: Positive for shortness of breath.   All other systems reviewed and are negative.    Physical Exam Updated Vital Signs BP (!) 147/106   Pulse (!) 40   Temp 99.1 F (37.3 C) (Oral)   Resp (!) 21   SpO2 96%   Physical Exam  Constitutional: She is oriented to person, place, and time. She appears well-developed and well-nourished.  HENT:  Head: Normocephalic and atraumatic.  Cardiovascular:  No murmur heard. Tachycardic and irregular  Pulmonary/Chest: Effort normal and breath sounds normal. No respiratory distress.  Abdominal: Soft. There is no tenderness. There is no rebound and no guarding.  Musculoskeletal:  1+ DP pulses bilaterally.  2+ pitting edema to RLE with mild erythema to the calf.  1+ pitting edema to LLE.  1cm ulcer to achilles region.    Neurological: She is alert and oriented to person, place, and time.  Skin: Skin is warm and dry.  Psychiatric: She has a normal mood and affect. Her behavior is normal.  Nursing note and vitals reviewed.    ED Treatments / Results  Labs (all labs ordered are listed, but only abnormal results are displayed) Labs Reviewed  CBC - Abnormal; Notable for the following components:      Result Value   RDW 17.0 (*)    All other components within normal limits  COMPREHENSIVE METABOLIC PANEL - Abnormal;  Notable for the following components:   Glucose, Bld 163 (*)    BUN 44 (*)    Creatinine, Ser 2.22 (*)    Total Protein 6.4 (*)    Albumin 3.1 (*)    GFR calc non Af Amer 19 (*)    GFR calc Af Amer 22 (*)    All other components within normal limits  PROTIME-INR - Abnormal; Notable for the following components:   Prothrombin Time 27.2 (*)    All other components within normal limits  BRAIN NATRIURETIC PEPTIDE - Abnormal; Notable for  the following components:   B Natriuretic Peptide 585.3 (*)    All other components within normal limits  CULTURE, BLOOD (ROUTINE X 2)  CULTURE, BLOOD (ROUTINE X 2)  HEMOGLOBIN A1C  CBC  BASIC METABOLIC PANEL  LIPID PANEL  I-STAT TROPONIN, ED    EKG EKG Interpretation  Date/Time:  Monday September 26 2017 11:44:44 EDT Ventricular Rate:  85 PR Interval:    QRS Duration: 96 QT Interval:  394 QTC Calculation: 468 R Axis:   -87 Text Interpretation:  Atrial fibrillation Left axis deviation ST & T wave abnormality, consider inferolateral ischemia Abnormal ECG Confirmed by Quintella Reichert [phone]) on 09/26/2017 12:47:04 PM   Radiology Dg Chest 2 View  Result Date: 09/26/2017 CLINICAL DATA:  Shortness of breath when laying down EXAM: CHEST - 2 VIEW COMPARISON:  07/05/2017 FINDINGS: Chronic cardiopericardial enlargement. Interstitial coarsening seen previously is regressed/resolved. Interstitial crowding without focal opacity. No Kerley lines, effusion, or pneumothorax. Diffuse degenerative endplate spurring. IMPRESSION: Chronic cardiopericardial enlargement.  No convincing edema. Electronically Signed   By: Monte Fantasia M.D.   On: 09/26/2017 12:15   Dg Tibia/fibula Right  Result Date: 09/26/2017 CLINICAL DATA:  Cellulitis of the right leg EXAM: RIGHT TIBIA AND FIBULA - 2 VIEW COMPARISON:  None. FINDINGS: No fracture or malalignment. No periostitis or bone destruction. Vascular calcifications. No soft tissue gas. IMPRESSION: No acute osseous abnormality  Electronically Signed   By: Donavan Foil M.D.   On: 09/26/2017 19:40   Dg Foot Complete Right  Result Date: 09/26/2017 CLINICAL DATA:  Cellulitis EXAM: RIGHT FOOT COMPLETE - 3+ VIEW COMPARISON:  None. FINDINGS: No fracture or malalignment. Degenerative changes at the first MTP joint. Vascular calcifications. Moderate plantar calcaneal spur. Soft tissue edema without soft tissue gas or radiopaque foreign body. IMPRESSION: No acute osseous abnormality Electronically Signed   By: Donavan Foil M.D.   On: 09/26/2017 19:42    Procedures Procedures (including critical care time)  Medications Ordered in ED Medications  furosemide (LASIX) injection 40 mg (has no administration in time range)  sodium chloride flush (NS) 0.9 % injection 3 mL (has no administration in time range)  acetaminophen (TYLENOL) tablet 650 mg (has no administration in time range)    Or  acetaminophen (TYLENOL) suppository 650 mg (has no administration in time range)  traMADol (ULTRAM) tablet 50 mg (has no administration in time range)  traZODone (DESYREL) tablet 25 mg (has no administration in time range)  polyethylene glycol (MIRALAX / GLYCOLAX) packet 17 g (has no administration in time range)  bisacodyl (DULCOLAX) EC tablet 5 mg (has no administration in time range)  ondansetron (ZOFRAN) tablet 4 mg (has no administration in time range)    Or  ondansetron (ZOFRAN) injection 4 mg (has no administration in time range)  albuterol (PROVENTIL) (2.5 MG/3ML) 0.083% nebulizer solution 2.5 mg (has no administration in time range)  doxycycline (VIBRA-TABS) tablet 100 mg (has no administration in time range)  cholecalciferol (VITAMIN D) tablet 1,000 Units (has no administration in time range)  feeding supplement (GLUCERNA SHAKE) (GLUCERNA SHAKE) liquid 237 mL (has no administration in time range)  latanoprost (XALATAN) 0.005 % ophthalmic solution 1 drop (has no administration in time range)  insulin aspart (novoLOG) injection 0-9  Units (has no administration in time range)  amLODipine (NORVASC) tablet 5 mg (5 mg Oral Given 09/26/17 1852)  hydrALAZINE (APRESOLINE) injection 10 mg (has no administration in time range)  pravastatin (PRAVACHOL) tablet 40 mg (has no administration in time range)  furosemide (LASIX)  injection 20 mg (20 mg Intravenous Given 09/26/17 1530)     Initial Impression / Assessment and Plan / ED Course  I have reviewed the triage vital signs and the nursing notes.  Pertinent labs & imaging results that were available during my care of the patient were reviewed by me and considered in my medical decision making (see chart for details).     Patient here for evaluation of progressive orthopnea, leg swelling and pain. Concern for CHF given orthopnea, elevation in BNP, tachycardia, edema. Cardiology consulted for recommendations. She was evaluated by cardiology in the emergency department and recommendation was made for admission. Hospitalist consulted for admission. Final Clinical Impressions(s) / ED Diagnoses   Final diagnoses:  Cellulitis and abscess of left leg  Cellulitis and abscess of left leg    ED Discharge Orders    None       Quintella Reichert, MD 09/26/17 2004

## 2017-09-26 NOTE — ED Notes (Signed)
Pt. Placed in Pod E 66, Pt. Placed in gown, and on monitor.  Pt. Also given instructions on call bell.  Pt. Verbalized understanding.

## 2017-09-26 NOTE — ED Notes (Signed)
Pt ambulated in hallway, denied shortness of breath; Sp02 between 89 and 93 while walking

## 2017-09-26 NOTE — ED Notes (Signed)
Attempted to call report; per Jocelyn Lamer nurse will call me back in 10 minutes.

## 2017-09-26 NOTE — ED Triage Notes (Signed)
Pt reports ongoing wound to back of right lower leg and poor healing. Now has redness and swelling to her lower leg. Reports recent sob that is worse when lying down at night. Hx of chf. No acute resp distress is noted at triage.

## 2017-09-26 NOTE — H&P (Signed)
History and Physical  Patricia Randall EHM:[id number] DOB: 29-Nov-1928 DOA: 09/26/2017  Referring physician: EDP PCP: Leonard Downing, MD  Outpatient Specialists: Dr Sung Amabile Patient coming from: Home & is able to ambulate independently  Chief Complaint: SOB, orthopnea  HPI: Patricia Randall is a 84 y.o. female with medical history significant for CHF(improved EF 60-65% in 2015), permanent A. fib on Coumadin, hyperlipidemia, hypertension, diabetes mellitus type 2 presents to the ER complaining of shortness of breath, orthopnea for the past few weeks.  Patient reports being unable to lay flat, and has to prop herself up while in bed.  Patient denies any bilateral lower extremity swelling, but does complain of ulcer/wound noted on her right foot for the past 3 weeks.  Patient also reported right lower extremity erythema, warmth, swelling that has worsened for the past few days.  Unsure if she received any antibiotics from her PCP.  Patient reports compliance to all her medications but noted that her PCP recently stopped her lisinopril.  Patient denies any chest pain/pressure, palpitations, dizziness, abdominal pain, cough, fever/chills.  No obvious drainage noted from the right foot ulcer.  Patient admitted for further management.  ED Course: Chest x-ray showed cardiac enlargement with no convincing edema.  BNP 585.  ED Trop x1-, EKG showed A. fib with heart rate controlled, with T wave inversion unchanged from previous.  Creatinine elevated at 2.2, worsening from baseline.  Patient remained afebrile, with no leukocytosis.  Review of Systems: Review of systems are otherwise negative   Past Medical History:  Diagnosis Date  . Atrial fibrillation ([place])    a. 03/2013 s/p TEE/DCCV;  b. 03/2013 Eliquis initiated.  . Chronic systolic CHF (congestive heart failure) ([place])    a. 03/2013 Echo: EF 20-25%, diff HK, mild to mod MR, sev dil LA, mild RV dysfxn, sev dil RA, mod TR.  . Claudication ([place])   . Glaucoma    . High cholesterol   . Hypertension   . Type II diabetes mellitus ([place])    Past Surgical History:  Procedure Laterality Date  . CARDIOVERSION N/A 04/02/2013   Procedure: CARDIOVERSION;  Surgeon: Thayer Headings, MD;  Location: [place];  Service: Cardiovascular;  Laterality: N/A;  Spoke with Tom   . ESOPHAGOGASTRODUODENOSCOPY N/A 05/26/2016   Procedure: ESOPHAGOGASTRODUODENOSCOPY (EGD);  Surgeon: Ladene Artist, MD;  [facility] ENDOSCOPY;  Service: Endoscopy;  Laterality: N/A;  . EYE SURGERY Right    "had blood in it"   . TEE WITHOUT CARDIOVERSION N/A 04/02/2013   Procedure: TRANSESOPHAGEAL ECHOCARDIOGRAM (TEE);  Surgeon: Thayer Headings, MD;  Location: [place];  Service: Cardiovascular;  Laterality: N/A;    Social History:  reports that she has never smoked. She has never used smokeless tobacco. She reports that she does not drink alcohol or use drugs.   No Known Allergies  Family History  Problem Relation Age of Onset  . Other Mother        died in her 72's - 'just got sick.'  . Other Father        died @ 80, unknown cause.  . Diabetes Brother   . Other Unknown        12 siblings + 7 more 1/2 siblings.      Prior to Admission medications   Medication Sig Start Date End Date Taking? Authorizing Provider  Biotin 10 MG CAPS Take 10 mg by mouth daily.   Yes [provider]  carvedilol (COREG) 6.25 MG tablet TAKE 1 AND 1/2 TABLETS BY  MOUTH TWICE A DAY WITH A MEAL 03/01/17  Yes Bensimhon, Shaune Pascal, MD  cholecalciferol (VITAMIN D) 1000 units tablet Take 1,000 Units by mouth daily.   Yes [provider]  feeding supplement, GLUCERNA SHAKE, (GLUCERNA SHAKE) LIQD Take 237 mLs by mouth daily.   Yes [provider]  furosemide (LASIX) 20 MG tablet Take 1 tablet (20 mg total) by mouth daily. 07/05/17  Yes Cardama, Grayce Sessions, MD  glimepiride (AMARYL) 4 MG tablet Take 6-8 mg by mouth daily before breakfast.    Yes [provider]    latanoprost (XALATAN) 0.005 % ophthalmic solution Place 1 drop into both eyes at bedtime.   Yes [provider]  lovastatin (MEVACOR) 40 MG tablet Take 40 mg by mouth at bedtime.   Yes [provider]  warfarin (COUMADIN) 2 MG tablet Take 1 tablet (2 mg total) by mouth daily. Patient taking differently: Take 1-2 mg by mouth daily. Take 2 mg for two days then 1 mg for a day then 2 mg for the next two days then 1 mg. 05/31/16  Yes Nita Sells, MD  lisinopril (PRINIVIL,ZESTRIL) 40 MG tablet Take 40 mg by mouth daily. 09/13/17   [provider]    Physical Exam: BP (!) 151/89   Pulse 66   Temp 99.1 F (37.3 C) (Oral)   Resp 20   SpO2 100%   General: NAD, alert, oriented Eyes: Anicteric no pallor ENT: No obvious abnormalities Neck: No JVD Cardiovascular: S1, S2 present irregular Respiratory: Clear to auscultation bilaterally Abdomen: Soft, nontender, nondistended, bowel sounds present Skin: Normal Musculoskeletal: Right lower extremity with edema, erythema, warmth, tenderness, 1+ pulses bilateral Psychiatric: Normal mood Neurologic: No focal neurologic deficits          Labs on Admission:  Basic Metabolic Panel: Recent Labs  Lab 09/26/17 1143  NA 138  K 4.6  CL 103  CO2 22  GLUCOSE 163*  BUN 44*  CREATININE 2.22*  CALCIUM 9.3   Liver Function Tests: Recent Labs  Lab 09/26/17 1143  AST 23  ALT 21  ALKPHOS 74  BILITOT 0.5  PROT 6.4*  ALBUMIN 3.1*   No results for input(s): LIPASE, AMYLASE in the last 168 hours. No results for input(s): AMMONIA in the last 168 hours. CBC: Recent Labs  Lab 09/26/17 1143  WBC 4.1  HGB 12.8  HCT 39.3  MCV 82.6  PLT 215   Cardiac Enzymes: No results for input(s): CKTOTAL, CKMB, CKMBINDEX, TROPONINI in the last 168 hours.  BNP (last 3 results) Recent Labs    07/05/17 1100 09/26/17 1143  BNP 524.5* 585.3*    ProBNP (last 3 results) No results for input(s): PROBNP in the last 8760  hours.  CBG: No results for input(s): GLUCAP in the last 168 hours.  Radiological Exams on Admission: Dg Chest 2 View  Result Date: 09/26/2017 CLINICAL DATA:  Shortness of breath when laying down EXAM: CHEST - 2 VIEW COMPARISON:  07/05/2017 FINDINGS: Chronic cardiopericardial enlargement. Interstitial coarsening seen previously is regressed/resolved. Interstitial crowding without focal opacity. No Kerley lines, effusion, or pneumothorax. Diffuse degenerative endplate spurring. IMPRESSION: Chronic cardiopericardial enlargement.  No convincing edema. Electronically Signed   By: Monte Fantasia M.D.   On: 09/26/2017 12:15    EKG: Independently reviewed. A. fib with heart rate controlled, with T wave inversion unchanged from previous.  Assessment/Plan Present on Admission: . Permanent atrial fibrillation ([place]) . Chronic diastolic congestive heart failure ([place]) . Essential hypertension, benign . Chronic kidney disease, stage  IV (severe) ([place])  Principal Problem:   Cardiomyopathy ([place]) Active Problems:   Permanent atrial fibrillation (H[facility])   Chronic diastolic congestive heart failure (H[facility])   Essential hypertension, benign   Chronic kidney disease, stage IV (severe) (H[facility])   CHF (congestive heart failure) (H[facility])  History of systolic heart failure Presents with orthopnea, SOB, elevated BNP 585 Last echo in 2015 showed LVEF of 60-65% Chest x-ray showed cardiomegaly with no vascular pulmonary congestion Status post IV Lasix in the ED Cardiology consulted: Start IV Lasix 40 mg twice daily Held Coreg due to possible acute decompensated CHF Repeat echo pending Daily weights, strict I's and O's Telemetry, monitor closely  RLE cellulitis with R foot ulcer/wound Afebrile, no leukocytosis BC X 2 pending Right lower extremity Doppler showed no DVT or SVT Right foot wound, no drainage noted XRAY RLE & rt foot pending Start PO doxycycline  Wound consult placed  AKI on CKD stage 3 Baseline  Cr 1.5-1.7, currently 2.2 on admission ? Renal congestion from CHF Continue diuresis, with close monitoring of renal function Avoid nephrotoxic's, renally dose all meds Daily BMP  Permanent A. fib Rate controlled Held Coreg, pending echo Continue Coumadin, dosing per pharmacy   Type 2 diabetes mellitus Repeat A1c pending SSI, Accu-Cheks Held home glimepiride  Hypertension Started amlodipine, held lisinopril due to AK I IV hydralazine as needed  Hyperlipidemia Lipid panel pending Continue statins     DVT prophylaxis: Coumadin  Code Status: Full  Family Communication: None at bedside  Disposition Plan: Back home once workup is complete  Consults called: Cardiology  Admission status: Observation    Alma Friendly MD [facility]   If 7PM-7AM, please contact night-coverage [email or web address] Password [facility]  09/26/2017, 6:37 PM

## 2017-09-26 NOTE — Progress Notes (Signed)
ANTICOAGULATION CONSULT NOTE - Initial Consult  Pharmacy Consult for warfarin Indication: atrial fibrillation  No Known Allergies  Patient Measurements:   Heparin Dosing Weight: n/a   Vital Signs: Temp: 99.1 F (37.3 C) (04/08 1449) Temp Source: Oral (04/08 1449) BP: 147/106 (04/08 1930) Pulse Rate: 40 (04/08 1930)  Labs: Recent Labs    09/26/17 1143  HGB 12.8  HCT 39.3  PLT 215  LABPROT 27.2*  INR 2.55  CREATININE 2.22*    CrCl cannot be calculated (Unknown ideal weight.).   Medical History: Past Medical History:  Diagnosis Date  . Atrial fibrillation ([place])    a. 03/2013 s/p TEE/DCCV;  b. 03/2013 Eliquis initiated.  . Chronic systolic CHF (congestive heart failure) ([place])    a. 03/2013 Echo: EF 20-25%, diff HK, mild to mod MR, sev dil LA, mild RV dysfxn, sev dil RA, mod TR.  . Claudication ([place])   . Glaucoma   . High cholesterol   . Hypertension   . Type II diabetes mellitus (H[facility])     Medications:   (Not in a hospital admission)  Assessment: 89 YOF with h/o Afib on warfarin at home here with shortness of breath and orthopnea. Pharmacy consulted to resume warfarin therapy. INR on admission is therapeutic at 2.55. H/H and plt wnl. Last dose of warfarin was 2 mg taken this morning.   Home warfarin dose: Warfarin 2 mg x 2 consecutive days, then 1 mg for 1 day, then repeat.   Goal of Therapy:  INR 2-3 Monitor platelets by anticoagulation protocol: Yes   Plan:  -Hold warfarin today. Resume tomorrow -Monitor daily PT/INR  Albertina Parr, PharmD., BCPS Clinical Pharmacist Clinical phone for 09/26/17 until 11pm: [phone] If after 11pm, please call main pharmacy at: [phone]

## 2017-09-26 NOTE — Plan of Care (Signed)

## 2017-09-26 NOTE — Progress Notes (Signed)
VASCULAR LAB PRELIMINARY  PRELIMINARY  PRELIMINARY  PRELIMINARY  Right lower extremity venous duplex completed.    Preliminary report:  There is no DVT or SVT noted in the right lower extremity.   Attempted to call results at 1617, but no answer in the ED.  Natoshia Souter, RVT 09/26/2017, 4:12 PM

## 2017-09-26 NOTE — Consult Note (Addendum)
Cardiology Consultation:   Patient ID: Patricia Randall; [id number]; [birth year]   Admit date: 09/26/2017 Date of Consult: 09/26/2017  Primary Care Provider: Leonard Downing, MD Primary Cardiologist: Remote-Dr. Sung Amabile  Patient Profile:   Patricia Randall is a 84 y.o. female with a hx of systolic heart failure with initial LVEF of 20-25% in 2014 with repeat echo with improvement in LVEF to 60-65% in 2015, atrial fibrillation on Coumadin (s/p failed TEE/DCCV in 2014), LE claudication, HLD, HTN and DM II who is being seen today for the evaluation of SOB and orthopnea worrisome for acute CHF at the request of Dr. Ralene Bathe.   History of Present Illness:   Patricia Randall is an 84yo F with a hx as stated above who presented to the ED on 09/26/17 with c/o SOB and orthopnea for the last week. She has a long hx of known HF who has been remotely follow by Dr. Haroldine Laws in the past after a hospital admission for atrial fibrillation with RVR and acute systolic heart failure in 2014. EF at that time was found to be 20-25% in the setting tachycardia with anterior/anteroseptal wall motion abnormalities. She was started on apixaban for the AF and had a TEE-guided DCCV on 04/02/2013. Repeat echocardiogram on 04/2013 with EF 35% with mid/distal inferior and apical akinesis. A subsequent Lexiscan stress was ordered on 05/2013 with EF 42%, global hypokinesis and a small fixed apical defect with no ischemia (low risk).  She was last seen by Dr. Haroldine Laws on 03/13/2014 for follow up. Her last echo at that time was from 10/10/13 which showed an improvement in LVEF to 60-65%. Her Afib was rate controlled with carvedilol 9.375mg  BID. Xarelto was not being taken due to high cost and she was subsequently started on Coumadin for anticoagulation. She is followed by her PCP for INR checks and Coumadin dosing. She reports compliance with all medications. She states that she was last seen by her PCP and was told to stop her lisinopril,  amlodipine and ABX, most likely secondary to her decreased renal function.    In the emergency department on 09/26/2017, a chest x-ray was performed which showed chronic cardiopericardial enlargement with no convincing edema. BNP on arrival was 585.  Creatinine markedly elevated at 2.22.  Point-of-care troponin negative x1. EKG revealed atrial fibrillation with HR 85 and T-wave inversions in leads II, III and V3-V6 no change from last EKG tracing from  09/02/17.   Upon interview, she reports of worsening shortness of breath and orthopnea over the last week. She states she has been unable to lie flat for sleep however is not necessarily SOB with exertion. She denies BLE swelling but has c/o of right LE redness with some swelling for which she was being treated for cellulitis related to wound on right foot. She denies chest pain/discomfort, palpitations, dizziness or syncope.    Past Medical History:  Diagnosis Date  . Atrial fibrillation ([place])    a. 03/2013 s/p TEE/DCCV;  b. 03/2013 Eliquis initiated.  . Chronic systolic CHF (congestive heart failure) ([place])    a. 03/2013 Echo: EF 20-25%, diff HK, mild to mod MR, sev dil LA, mild RV dysfxn, sev dil RA, mod TR.  . Claudication ([place])   . Glaucoma   . High cholesterol   . Hypertension   . Type II diabetes mellitus ([place])     Past Surgical History:  Procedure Laterality Date  . CARDIOVERSION N/A 04/02/2013   Procedure: CARDIOVERSION;  Surgeon: Thayer Headings, MD;  Location: [facility] ENDOSCOPY;  Service: Cardiovascular;  Laterality: N/A;  Spoke with Tom   . ESOPHAGOGASTRODUODENOSCOPY N/A 05/26/2016   Procedure: ESOPHAGOGASTRODUODENOSCOPY (EGD);  Surgeon: Ladene Artist, MD;  Location: [facility] ENDOSCOPY;  Service: Endoscopy;  Laterality: N/A;  . EYE SURGERY Right    "had blood in it"   . TEE WITHOUT CARDIOVERSION N/A 04/02/2013   Procedure: TRANSESOPHAGEAL ECHOCARDIOGRAM (TEE);  Surgeon: Thayer Headings, MD;  Location: [facility];  Service: Cardiovascular;   Laterality: N/A;     Prior to Admission medications   Medication Sig Start Date End Date Taking? Authorizing Provider  Biotin 10 MG CAPS Take 10 mg by mouth daily.   Yes [provider]  carvedilol (COREG) 6.25 MG tablet TAKE 1 AND 1/2 TABLETS BY MOUTH TWICE A DAY WITH A MEAL 03/01/17  Yes Bensimhon, Shaune Pascal, MD  cholecalciferol (VITAMIN D) 1000 units tablet Take 1,000 Units by mouth daily.   Yes [provider]  feeding supplement, GLUCERNA SHAKE, (GLUCERNA SHAKE) LIQD Take 237 mLs by mouth daily.   Yes [provider]  furosemide (LASIX) 20 MG tablet Take 1 tablet (20 mg total) by mouth daily. 07/05/17  Yes Cardama, Grayce Sessions, MD  glimepiride (AMARYL) 4 MG tablet Take 6-8 mg by mouth daily before breakfast.    Yes [provider]  latanoprost (XALATAN) 0.005 % ophthalmic solution Place 1 drop into both eyes at bedtime.   Yes [provider]  lovastatin (MEVACOR) 40 MG tablet Take 40 mg by mouth at bedtime.   Yes [provider]  warfarin (COUMADIN) 2 MG tablet Take 1 tablet (2 mg total) by mouth daily. Patient taking differently: Take 1-2 mg by mouth daily. Take 2 mg for two days then 1 mg for a day then 2 mg for the next two days then 1 mg. 05/31/16  Yes Nita Sells, MD  lisinopril (PRINIVIL,ZESTRIL) 40 MG tablet Take 40 mg by mouth daily. 09/13/17   [provider]  traMADol (ULTRAM) 50 MG tablet Take 1 tablet (50 mg total) by mouth every 6 (six) hours as needed. Patient not taking: Reported on 09/26/2017 05/30/14   Harvie Heck, PA-C  traMADol (ULTRAM) 50 MG tablet Take 1 tablet (50 mg total) by mouth every 6 (six) hours as needed. Patient not taking: Reported on 09/26/2017 07/05/15   Veatrice Bourbon, MD    Inpatient Medications: Scheduled Meds:  Continuous Infusions:  PRN Meds:  Allergies:   No Known Allergies  Social History:   Social History   Socioeconomic History  . Marital status: Widowed    Spouse  name: Not on file  . Number of children: Not on file  . Years of education: Not on file  . Highest education level: Not on file  Occupational History  . Not on file  Social Needs  . Financial resource strain: Not on file  . Food insecurity:    Worry: Not on file    Inability: Not on file  . Transportation needs:    Medical: Not on file    Non-medical: Not on file  Tobacco Use  . Smoking status: Never Smoker  . Smokeless tobacco: Never Used  Substance and Sexual Activity  . Alcohol use: No  . Drug use: No  . Sexual activity: Never  Lifestyle  . Physical activity:    Days per week: Not on file    Minutes per session: Not on file  . Stress: Not on file  Relationships  . Social connections:  Talks on phone: Not on file    Gets together: Not on file    Attends religious service: Not on file    Active member of club or organization: Not on file    Attends meetings of clubs or organizations: Not on file    Relationship status: Not on file  . Intimate partner violence:    Fear of current or ex partner: Not on file    Emotionally abused: Not on file    Physically abused: Not on file    Forced sexual activity: Not on file  Other Topics Concern  . Not on file  Social History Narrative   Lives in [place] with her son.    Family History:   Family History  Problem Relation Age of Onset  . Other Mother        died in her 19's - 'just got sick.'  . Other Father        died @ 95, unknown cause.  . Diabetes Brother   . Other Unknown        12 siblings + 7 more 1/2 siblings.   Family Status:  Family Status  Relation Name Status  . Mother  (Not Specified)  . Father  (Not Specified)  . Brother  (Not Specified)  . Unknown  (Not Specified)    ROS:  Please see the history of present illness.  All other ROS reviewed and negative.     Physical Exam/Data:   Vitals:   09/26/17 1126 09/26/17 1449  BP: 101/79 (!) 123/96  Pulse: 78 86  Resp: 20 19  Temp: 98.4 F (36.9  C) 99.1 F (37.3 C)  TempSrc: Oral Oral  SpO2: 97% 98%    Intake/Output Summary (Last 24 hours) at 09/26/2017 1606 Last data filed at 09/26/2017 1445 Gross per 24 hour  Intake -  Output 200 ml  Net -200 ml   There were no vitals filed for this visit. There is no height or weight on file to calculate BMI.   General: Elderly, NAD Skin: Warm, dry, intact  Head: Normocephalic, atraumatic, clear, moist mucus membranes. Neck: Negative for carotid bruits. No JVD Lungs:Clear to ausculation bilaterally. No wheezes, rales, or rhonchi. Breathing is unlabored. Cardiovascular: Irregularly irregular with S1 S2. No murmurs, rubs or gallops Abdomen: Soft, non-tender, non-distended with normoactive bowel sounds.  No obvious abdominal masses. MSK: Strength and tone appear normal for age. 5/5 in all extremities Extremities: Right LE edema with redness. No clubbing or cyanosis. DP/PT pulses 1+ bilaterally Neuro: Alert and oriented. No focal deficits. No facial asymmetry. MAE spontaneously. Psych: Responds to questions appropriately with normal affect.     EKG:  The EKG was personally reviewed and demonstrates:  09/26/17 Atrial fibrillation with HR 84 T wave inversion in leads II, III, V3-V6, no change from previous tracings  Telemetry:  Telemetry was personally reviewed and demonstrates:  AF with HR 86  Relevant CV Studies:  ECHO: 04/01/13 Study Conclusions  - Study data: Interpretation is limited by tachycardia and irregular rhythm. - Left ventricle: The cavity size was normal. Wall thickness was normal. The estimated ejection fraction was in the range of 20% to 25%. Systolic function may be underestimated in the setting of severe tachycardia and an irregular rhythm. Diffuse hypokinesis. The study is not technically sufficient to allow evaluation of LV diastolic function. - Regional wall motion abnormality: Akinesis of the mid anterior, mid anteroseptal, mid inferoseptal, and  apical septal myocardium. - Aortic valve: Mildly calcified annulus. Trileaflet; mildly  thickened leaflets. Valve area: 1.99cm^2(VTI). Valve area: 2.52cm^2 (Vmax). - Mitral valve: Mild to moderate regurgitation. The MR vena contracta is 0.4 cm. - Left atrium: The atrium was severely dilated. - Right ventricle: The cavity size was mildly dilated. Systolic function was mildly reduced. RV TAPSE is 1.4 cm. - Right atrium: The atrium was severely dilated. - Tricuspid valve: Moderate regurgitation. The TR vena contracta is 0.4 cm . - Pulmonary arteries: Systolic pressure was moderately increased. PA peak pressure: 33mm Hg (S). Transthoracic echocardiography. M-mode, complete 2D, spectral Doppler, and color Doppler. Height: Height: 165.1cm. Height: 65in. Weight: Weight: 59.4kg. Weight: 130.7lb. Body mass index: BMI: 21.8kg/m^2. Body surface area:  BSA: 1.75m^2. Blood pressure:   116/72. Patient status: Inpatient. Location: Bedside.  Echo 05/14/13: Study Conclusions  - Left ventricle: LVEF is approximately 35% with diffuse hypokinesis, mid/distal inferior and apical akinesis. The cavity size was mildly dilated. Wall thickness was normal. - Mitral valve: Mild regurgitation. - Right ventricle: Systolic function was mildly reduced. Transthoracic echocardiography. M-mode, complete 2D, spectral Doppler, and color Doppler. Height: Height: 154.9cm. Height: 61in. Weight: Weight: 63.2kg. Weight: 139lb. Body mass index: BMI: 26.3kg/m^2. Body surface area:  BSA: 1.76m^2. Blood pressure:   152/84. Patient status: Outpatient. Location: Echo laboratory.  Echo 10/10/13: Study Conclusions  Left ventricle: Wall thickness was increased in a pattern of moderate LVH.  There was focal basal hypertrophy. Systolic function was normal. The estimated ejection fraction was in the range of 60% to 65%. Transthoracic echocardiography. M-mode, complete  2D, spectral Doppler, and color Doppler. Height: Height: 157.5cm. Height: 62in. Weight: Weight: 67.3kg. Weight: 148lb. Body mass index: BMI: 27.1kg/m^2. Body surface area:  BSA: 1.68m^2. Blood pressure:   124/66. Patient status: Inpatient. Location: Echo laboratory.  CATH: NA   Laboratory Data:  Chemistry Recent Labs  Lab 09/26/17 1143  NA 138  K 4.6  CL 103  CO2 22  GLUCOSE 163*  BUN 44*  CREATININE 2.22*  CALCIUM 9.3  GFRNONAA 19*  GFRAA 22*  ANIONGAP 13    Total Protein  Date Value Ref Range Status  09/26/2017 6.4 (L) 6.5 - 8.1 g/dL Final   Albumin  Date Value Ref Range Status  09/26/2017 3.1 (L) 3.5 - 5.0 g/dL Final   AST  Date Value Ref Range Status  09/26/2017 23 15 - 41 U/L Final   ALT  Date Value Ref Range Status  09/26/2017 21 14 - 54 U/L Final   Alkaline Phosphatase  Date Value Ref Range Status  09/26/2017 74 38 - 126 U/L Final   Total Bilirubin  Date Value Ref Range Status  09/26/2017 0.5 0.3 - 1.2 mg/dL Final   Hematology Recent Labs  Lab 09/26/17 1143  WBC 4.1  RBC 4.76  HGB 12.8  HCT 39.3  MCV 82.6  MCH 26.9  MCHC 32.6  RDW 17.0*  PLT 215   Cardiac EnzymesNo results for input(s): TROPONINI in the last 168 hours.  Recent Labs  Lab 09/26/17 1154  TROPIPOC 0.00    BNP Recent Labs  Lab 09/26/17 1143  BNP 585.3*    DDimer No results for input(s): DDIMER in the last 168 hours. TSH:  Lab Results  Component Value Date   TSH 1.190 03/30/2013   Lipids: Lab Results  Component Value Date   CHOL 120 09/27/2013   HDL 60 09/27/2013   LDLCALC 44 09/27/2013   TRIG 79 09/27/2013   CHOLHDL 2.0 09/27/2013   HgbA1c: Lab Results  Component Value Date   HGBA1C 6.2 (H) 05/25/2016  Radiology/Studies:  Dg Chest 2 View  Result Date: 09/26/2017 CLINICAL DATA:  Shortness of breath when laying down EXAM: CHEST - 2 VIEW COMPARISON:  07/05/2017 FINDINGS: Chronic cardiopericardial enlargement. Interstitial coarsening  seen previously is regressed/resolved. Interstitial crowding without focal opacity. No Kerley lines, effusion, or pneumothorax. Diffuse degenerative endplate spurring. IMPRESSION: Chronic cardiopericardial enlargement.  No convincing edema. Electronically Signed   By: Monte Fantasia M.D.   On: 09/26/2017 12:15    Assessment and Plan:   1.Hx of systolic heart failure with improvement as of 09/2013: -Echo in 2014 LVEF at 20-25% with subsequent improvement as of echo in 09/2013 with LVEF of 60-35% -Will order repeat echo given presenting symptoms -Lasix IV 20mg  given in ED -Will start Lasix IV 40mg  BID for fluid volume overload -Weight, none on file  -I&O, net negative 293ml   2. Chronic kidney disease stage II: -Creatinine, 2.22 -Baseline appears to be in the 1.5-1.7 range -Will diuresis and watch renal function closely   3. Persistent atrial fibrillation: -Rate controlled, HR 84  -Carvedilol 9.375  -On Coumadin, INR>>2.55 -Chads Vasc Score 4 with 8.5 % risk of CVA. She needs to be on anticoagulant.    For questions or updates, please contact [place] Please consult [email or web address] for contact info under Cardiology/STEMI.   Lyndel Safe NP-C [facility] Pager: [phone] 09/26/2017 4:06 PM  Personally seen and examined. Agree with above.  84 year old female with marked cardiomegaly noted on chest x-ray without any significant pulmonary edema here with orthopnea suggestive of acute systolic heart failure despite normalization of ejection fraction on last echocardiogram.  Original echocardiogram showed EF of 20-25%.  She had seen Dr. Haroldine Laws remotely.  Currently she is more comfortable in bed after receiving IV diuresis.  Does not appear to be in any distress.  Heart irregularly irregular with no significant murmurs.  JVD mid neck.  Lungs no significant crackles heard.  Normal respiratory effort.  Left lower extremity mild red patch noted calf region with small  centimeter circumferential ulceration noted in the region of the Achilles tendon.  1-2+ edema bilaterally otherwise.  ER physician was concerned about mild confusion.  Labs: Creatinine 2.22, baseline of 1.5-1.7. ECG: Personally reviewed, atrial fibrillation, T wave inversions noted in the precordial leads which are unchanged from prior.  Assessment and plan:  Acute systolic heart failure -Repeat echocardiogram.  Given her marked cardiomegaly, I would not be surprised if her ejection fraction once again is 20-25% with underlying dilated cardiomyopathy despite her normalization previously.  Blood pressure increased.  Recommend continued diuresis despite mildly elevated creatinine.  Continue to monitor closely.  Blood pressure should follow.  On both carvedilol as well as lisinopril, home medications reviewed as above.  Acute kidney injury -Creatinine has worsened.  Perhaps this is from decreased cardiac output resulting in her current constellation of symptoms.  Hopefully diuresis will optimize her Starling curve.  Permanent atrial fibrillation -Currently rate controlled.  Coumadin.  Dr. Arelia Sneddon.  Lower extremity left ulceration with mild redness noted -Currently being treated for cellulitis.  Consider further workup for peripheral vascular disease.  Candee Furbish, MD

## 2017-09-27 ENCOUNTER — Encounter (HOSPITAL_COMMUNITY): Payer: Self-pay | Admitting: General Practice

## 2017-09-27 ENCOUNTER — Observation Stay (HOSPITAL_BASED_OUTPATIENT_CLINIC_OR_DEPARTMENT_OTHER): Payer: Medicare Other

## 2017-09-27 DIAGNOSIS — N183 Chronic kidney disease, stage 3 (moderate): Secondary | ICD-10-CM | POA: Diagnosis not present

## 2017-09-27 DIAGNOSIS — I482 Chronic atrial fibrillation: Secondary | ICD-10-CM

## 2017-09-27 DIAGNOSIS — I10 Essential (primary) hypertension: Secondary | ICD-10-CM | POA: Diagnosis not present

## 2017-09-27 DIAGNOSIS — I340 Nonrheumatic mitral (valve) insufficiency: Secondary | ICD-10-CM

## 2017-09-27 DIAGNOSIS — I5032 Chronic diastolic (congestive) heart failure: Secondary | ICD-10-CM | POA: Diagnosis not present

## 2017-09-27 DIAGNOSIS — I361 Nonrheumatic tricuspid (valve) insufficiency: Secondary | ICD-10-CM | POA: Diagnosis not present

## 2017-09-27 DIAGNOSIS — I5023 Acute on chronic systolic (congestive) heart failure: Secondary | ICD-10-CM | POA: Diagnosis not present

## 2017-09-27 LAB — LIPID PANEL
CHOLESTEROL: 136 mg/dL (ref 0–200)
HDL: 48 mg/dL (ref >40)
LDL CALC: 69 mg/dL (ref 0–99)
Total CHOL/HDL Ratio: 2.8 RATIO
Triglycerides: 95 mg/dL (ref <150)
VLDL: 19 mg/dL (ref 0–40)

## 2017-09-27 LAB — BASIC METABOLIC PANEL
Anion gap: 13 (ref 5–15)
BUN: 40 mg/dL — AB (ref 6–20)
CO2: 23 mmol/L (ref 22–32)
CREATININE: 2.09 mg/dL — AB (ref 0.44–1.00)
Calcium: 9.5 mg/dL (ref 8.9–10.3)
Chloride: 103 mmol/L (ref 101–111)
GFR calc non Af Amer: 20 mL/min — ABNORMAL LOW (ref >60)
GFR, EST AFRICAN AMERICAN: 24 mL/min — AB (ref >60)
Glucose, Bld: 103 mg/dL — ABNORMAL HIGH (ref 65–99)
POTASSIUM: 3.9 mmol/L (ref 3.5–5.1)
Sodium: 139 mmol/L (ref 135–145)

## 2017-09-27 LAB — CBC
HCT: 42.8 % (ref 36.0–46.0)
Hemoglobin: 14.1 g/dL (ref 12.0–15.0)
MCH: 27 pg (ref 26.0–34.0)
MCHC: 32.9 g/dL (ref 30.0–36.0)
MCV: 81.8 fL (ref 78.0–100.0)
PLATELETS: 200 10*3/uL (ref 150–400)
RBC: 5.23 MIL/uL — AB (ref 3.87–5.11)
RDW: 17 % — ABNORMAL HIGH (ref 11.5–15.5)
WBC: 3.6 10*3/uL — ABNORMAL LOW (ref 4.0–10.5)

## 2017-09-27 LAB — GLUCOSE, CAPILLARY
GLUCOSE-CAPILLARY: 105 mg/dL — AB (ref 65–99)
Glucose-Capillary: 161 mg/dL — ABNORMAL HIGH (ref 65–99)
Glucose-Capillary: 145 mg/dL — ABNORMAL HIGH (ref 65–99)
Glucose-Capillary: 110 mg/dL — ABNORMAL HIGH (ref 65–99)

## 2017-09-27 LAB — ECHOCARDIOGRAM COMPLETE
Height: 60 in
WEIGHTICAEL: 2340.8 [oz_av]

## 2017-09-27 LAB — HEMOGLOBIN A1C
HEMOGLOBIN A1C: 7.6 % — AB (ref 4.8–5.6)
Mean Plasma Glucose: 171.42 mg/dL

## 2017-09-27 LAB — PROTIME-INR
INR: 2.12
Prothrombin Time: 23.6 seconds — ABNORMAL HIGH (ref 11.4–15.2)

## 2017-09-27 MED ORDER — WARFARIN SODIUM 2 MG PO TABS
2.0000 mg | ORAL_TABLET | Freq: Once | ORAL | Status: AC
  Administered 2017-09-27: 2 mg via ORAL
  Filled 2017-09-27: qty 1

## 2017-09-27 MED ORDER — CARVEDILOL 6.25 MG PO TABS
6.2500 mg | ORAL_TABLET | Freq: Two times a day (BID) | ORAL | Status: DC
  Administered 2017-09-27 – 2017-09-28 (×2): 6.25 mg via ORAL
  Filled 2017-09-27 (×2): qty 1

## 2017-09-27 NOTE — Discharge Instructions (Signed)
Information on my medicine - Coumadin®   (Warfarin) ° °Why was Coumadin prescribed for you? °Coumadin was prescribed for you because you have a blood clot or a medical condition that can cause an increased risk of forming blood clots. Blood clots can cause serious health problems by blocking the flow of blood to the heart, lung, or brain. Coumadin can prevent harmful blood clots from forming. °As a reminder your indication for Coumadin is:   Stroke Prevention Because Of Atrial Fibrillation ° °What test will check on my response to Coumadin? °While on Coumadin (warfarin) you will need to have an INR test regularly to ensure that your dose is keeping you in the desired range. The INR (international normalized ratio) number is calculated from the result of the laboratory test called prothrombin time (PT). ° °If an INR APPOINTMENT HAS NOT ALREADY BEEN MADE FOR YOU please schedule an appointment to have this lab work done by your health care provider within 7 days. °Your INR goal is usually a number between:  2 to 3 or your provider may give you a more narrow range like 2-2.5.  Ask your health care provider during an office visit what your goal INR is. ° °What  do you need to  know  About  COUMADIN? °Take Coumadin (warfarin) exactly as prescribed by your healthcare provider about the same time each day.  DO NOT stop taking without talking to the doctor who prescribed the medication.  Stopping without other blood clot prevention medication to take the place of Coumadin may increase your risk of developing a new clot or stroke.  Get refills before you run out. ° °What do you do if you miss a dose? °If you miss a dose, take it as soon as you remember on the same day then continue your regularly scheduled regimen the next day.  Do not take two doses of Coumadin at the same time. ° °Important Safety Information °A possible side effect of Coumadin (Warfarin) is an increased risk of bleeding. You should call your healthcare  provider right away if you experience any of the following: °? Bleeding from an injury or your nose that does not stop. °? Unusual colored urine (red or dark [place]) or unusual colored stools (red or black). °? Unusual bruising for unknown reasons. °? A serious fall or if you hit your head (even if there is no bleeding). ° °Some foods or medicines interact with Coumadin® (warfarin) and might alter your response to warfarin. To help avoid this: °? Eat a balanced diet, maintaining a consistent amount of Vitamin K. °? Notify your provider about major diet changes you plan to make. °? Avoid alcohol or limit your intake to 1 drink for women and 2 drinks for men per day. °(1 drink is 5 oz. wine, 12 oz. beer, or 1.5 oz. liquor.) ° °Make sure that ANY health care provider who prescribes medication for you knows that you are taking Coumadin (warfarin).  Also make sure the healthcare provider who is monitoring your Coumadin knows when you have started a new medication including herbals and non-prescription products. ° °Coumadin® (Warfarin)  Major Drug Interactions  °Increased Warfarin Effect Decreased Warfarin Effect  °Alcohol (large quantities) °Antibiotics (esp. Septra/Bactrim, Flagyl, Cipro) °Amiodarone (Cordarone) °Aspirin (ASA) °Cimetidine (Tagamet) °Megestrol (Megace) °NSAIDs (ibuprofen, naproxen, etc.) °Piroxicam (Feldene) °Propafenone (Rythmol SR) °Propranolol (Inderal) °Isoniazid (INH) °Posaconazole (Noxafil) Barbiturates (Phenobarbital) °Carbamazepine (Tegretol) °Chlordiazepoxide (Librium) °Cholestyramine (Questran) °Griseofulvin °Oral Contraceptives °Rifampin °Sucralfate (Carafate) °Vitamin K  ° °Coumadin® (Warfarin) Major Herbal   Interactions  °Increased Warfarin Effect Decreased Warfarin Effect  °Garlic °Ginseng °Ginkgo biloba Coenzyme Q10 °Green tea °St. John’s wort   ° °Coumadin® (Warfarin) FOOD Interactions  °Eat a consistent number of servings per week of foods HIGH in Vitamin K °(1 serving = ½ cup)  °Collards  (cooked, or boiled & drained) °Kale (cooked, or boiled & drained) °Mustard greens (cooked, or boiled & drained) °Parsley *serving size only = ¼ cup °Spinach (cooked, or boiled & drained) °Swiss chard (cooked, or boiled & drained) °Turnip greens (cooked, or boiled & drained)  °Eat a consistent number of servings per week of foods MEDIUM-HIGH in Vitamin K °(1 serving = 1 cup)  °Asparagus (cooked, or boiled & drained) °Broccoli (cooked, boiled & drained, or raw & chopped) °Brussel sprouts (cooked, or boiled & drained) *serving size only = ½ cup °Lettuce, raw (green leaf, endive, romaine) °Spinach, raw °Turnip greens, raw & chopped  ° °These websites have more information on Coumadin (warfarin):  www.coumadin.com; °www.ahrq.gov/consumer/coumadin.htm; ° ° °

## 2017-09-27 NOTE — Consult Note (Signed)
[facility] Nurse wound consult note Reason for Consult: Consult requested for right leg wound.  Pt has a chronic wound which a physician has ordered Silvadene for prior to admission. Wound type: Chronic full thickness stasis ulcer to right posterior calf Measurement: .4X.4cm Wound bed: dry yellow slough to wound bed, generalized edema to right leg Drainage (amount, consistency, odor) No odor or drainage Periwound: Intact skin surrounding Dressing procedure/placement/frequency: Continue present plan of care with Silvadene to provide enzymatic debridement; pt has some at the bedside.  Foam dressing to protect from further injury.  Pt can resume follow-up with her previous physician after discharge. Please re-consult if further assistance is needed.  Thank-you,  Julien Girt MSN, [place], [place], [place], [place]

## 2017-09-27 NOTE — Progress Notes (Signed)
  Echocardiogram 2D Echocardiogram has been performed.  Patricia Randall 09/27/2017, 8:58 AM

## 2017-09-27 NOTE — Progress Notes (Signed)
ANTICOAGULATION CONSULT NOTE - Initial Consult  Pharmacy Consult for warfarin Indication: atrial fibrillation  No Known Allergies  Patient Measurements: Height: 5' (152.4 cm) Weight: 146 lb 4.8 oz (66.4 kg)(a scake) IBW/kg (Calculated) : 45.5 Heparin Dosing Weight: n/a   Vital Signs: Temp: 97.6 F (36.4 C) (04/09 1135) Temp Source: Oral (04/09 1135) BP: 106/81 (04/09 1135) Pulse Rate: 63 (04/09 1135)  Labs: Recent Labs    09/26/17 1143 09/27/17 0600  HGB 12.8 14.1  HCT 39.3 42.8  PLT 215 200  LABPROT 27.2* 23.6*  INR 2.55 2.12  CREATININE 2.22* 2.09*    Estimated Creatinine Clearance: 16.4 mL/min (A) (by C-G formula based on SCr of 2.09 mg/dL (H)).   Medical History: Past Medical History:  Diagnosis Date  . Atrial fibrillation ([place])    a. 03/2013 s/p TEE/DCCV;  b. 03/2013 Eliquis initiated.  . Chronic systolic CHF (congestive heart failure) ([place])    a. 03/2013 Echo: EF 20-25%, diff HK, mild to mod MR, sev dil LA, mild RV dysfxn, sev dil RA, mod TR.  . Claudication ([place])   . Glaucoma   . High cholesterol   . Hypertension   . Type II diabetes mellitus (HCC)     Medications:  Medications Prior to Admission  Medication Sig Dispense Refill Last Dose  . Biotin 10 MG CAPS Take 10 mg by mouth daily.   Past Week at Unknown time  . carvedilol (COREG) 6.25 MG tablet TAKE 1 AND 1/2 TABLETS BY MOUTH TWICE A DAY WITH A MEAL 90 tablet 2 09/26/2017 at 0800  . cholecalciferol (VITAMIN D) 1000 units tablet Take 1,000 Units by mouth daily.   Past Week at Unknown time  . feeding supplement, GLUCERNA SHAKE, (GLUCERNA SHAKE) LIQD Take 237 mLs by mouth daily.   09/26/2017 at Unknown time  . furosemide (LASIX) 20 MG tablet Take 1 tablet (20 mg total) by mouth daily. 30 tablet 0 09/26/2017 at Unknown time  . glimepiride (AMARYL) 4 MG tablet Take 6-8 mg by mouth daily before breakfast.    09/26/2017 at Unknown time  . latanoprost (XALATAN) 0.005 % ophthalmic solution Place 1 drop into both  eyes at bedtime.   09/25/2017 at Unknown time  . lovastatin (MEVACOR) 40 MG tablet Take 40 mg by mouth at bedtime.   09/25/2017 at Unknown time  . warfarin (COUMADIN) 2 MG tablet Take 1 tablet (2 mg total) by mouth daily. (Patient taking differently: Take 1-2 mg by mouth daily. Take 2 mg for two days then 1 mg for a day then 2 mg for the next two days then 1 mg.) 4 tablet 0 09/26/2017 at Unknown time  . lisinopril (PRINIVIL,ZESTRIL) 40 MG tablet Take 40 mg by mouth daily.  0     Assessment: 88 YOF on Coumadin 2mg  x 2 days, then 1mg  x 1 day and repeat PTA for Afib. INR therapeutic today at 2.12. Hgb 14.1, plts wnl.  Goal of Therapy:  INR 2-3 Monitor platelets by anticoagulation protocol: Yes   Plan:  Give Coumadin 2mg  PO x 1 Monitor daily INR, CBC, s/s of bleed  Elenor Quinones, PharmD, [facility] Clinical Pharmacist Pager [phone] 09/27/2017 12:35 PM

## 2017-09-27 NOTE — Progress Notes (Signed)
PROGRESS NOTE  Patricia Randall GXQ:[id number] DOB: [birth year] DOA: 09/26/2017 PCP: Leonard Downing, MD  HPI/Recap of past 24 hours: Patricia Randall is a 84 y.o. female with medical history significant for CHF(improved EF 60-65% in 2015), permanent A. fib on Coumadin, hyperlipidemia, hypertension, diabetes mellitus type 2 presents to the ER complaining of shortness of breath, orthopnea for the past few weeks. Patient denies any bilateral lower extremity swelling, but does complain of ulcer/wound noted on her right leg for the past 3 weeks.  Patient also reported right lower extremity erythema, warmth, swelling that has worsened for the past few days.  Unsure if she received any antibiotics from her PCP.  Patient reports compliance to all her medications but noted that her PCP recently stopped her lisinopril. In the ED, CXR showed cardiac enlargement with no pulmonary vascular congestion, BNP 585.  ED Trop x1-, EKG showed A. fib with heart rate controlled, with T wave inversion unchanged from previous. Creatinine elevated at 2.2, worsening from baseline. Cardiology consulted, pt admitted for further management.  Today, patient reported no new complaints, denies any chest pain, worsening shortness of breath.  Assessment/Plan: Principal Problem:   Cardiomyopathy ([place]) Active Problems:   Permanent atrial fibrillation (HCC)   Chronic diastolic congestive heart failure (HCC)   Essential hypertension, benign   Chronic kidney disease, stage IV (severe) (HCC)   CHF (congestive heart failure) (HCC)  Acute on chronic systolic heart failure Presents with orthopnea, SOB, elevated BNP 585 ECHO on 09/27/17: EF of 30-35%, diffuse hypokinesis. Last echo in 2015 showed LVEF of 60-65% Chest x-ray showed cardiomegaly with no vascular pulmonary congestion Cardiology consulted: IV Lasix 40 mg twice daily, continue coreg Daily weights, strict I's and O's Telemetry, monitor closely  RLE cellulitis with R leg  ulcer/wound Afebrile, no leukocytosis BC X 2 pending Right lower extremity Doppler showed no DVT or SVT Right foot wound, no drainage noted XRAY RLE & rt foot no acute abnormalities Continue PO doxycycline  Wound care consulted  AKI on CKD stage 3 Improving Baseline Cr 1.5-1.7, currently 2.2 on admission ? Renal congestion from CHF Continue diuresis, with close monitoring of renal function Avoid nephrotoxic's, renally dose all meds Daily BMP  Permanent A. fib Rate controlled Continue coreg Continue Coumadin, dosing per pharmacy   Type 2 diabetes mellitus A1c 7.6 SSI, Accu-Cheks Held home glimepiride  Hypertension Started amlodipine, held lisinopril due to AK I IV hydralazine as needed  Hyperlipidemia LDL 69 at goal Continue statins      Code Status: Full  Family Communication: None at bedside  Disposition Plan: Home once stable   Consultants:  Cardiology  Procedures:  None  Antimicrobials:  Doxycycline  DVT prophylaxis: Coumadin   Objective: Vitals:   09/27/17 0751 09/27/17 0855 09/27/17 0924 09/27/17 1135  BP: 118/80 128/74 134/90 106/81  Pulse: (!) 45 (!) 47 100 63  Resp: 18 19  19   Temp: 97.6 F (36.4 C) 97.7 F (36.5 C)  97.6 F (36.4 C)  TempSrc: Oral Oral  Oral  SpO2: 99% 98% 99% 94%  Weight:      Height:        Intake/Output Summary (Last 24 hours) at 09/27/2017 1233 Last data filed at 09/27/2017 1000 Gross per 24 hour  Intake 483 ml  Output 1050 ml  Net -567 ml   Filed Weights   09/26/17 2113 09/27/17 0424  Weight: 67.6 kg (149 lb 1.6 oz) 66.4 kg (146 lb 4.8 oz)    Exam:   General: NAD  Cardiovascular: S1, S2 present irregular  Respiratory: Bibasilar crackles  Abdomen: Soft, nontender, nondistended, bowel sounds present  Musculoskeletal: Right lower extremity with edema, erythema, tenderness  Skin: Normal except for above  Psychiatry: Normal mood   Data Reviewed: CBC: Recent Labs  Lab  09/26/17 1143 09/27/17 0600  WBC 4.1 3.6*  HGB 12.8 14.1  HCT 39.3 42.8  MCV 82.6 81.8  PLT 215 301   Basic Metabolic Panel: Recent Labs  Lab 09/26/17 1143 09/27/17 0600  NA 138 139  K 4.6 3.9  CL 103 103  CO2 22 23  GLUCOSE 163* 103*  BUN 44* 40*  CREATININE 2.22* 2.09*  CALCIUM 9.3 9.5   GFR: Estimated Creatinine Clearance: 16.4 mL/min (A) (by C-G formula based on SCr of 2.09 mg/dL (H)). Liver Function Tests: Recent Labs  Lab 09/26/17 1143  AST 23  ALT 21  ALKPHOS 74  BILITOT 0.5  PROT 6.4*  ALBUMIN 3.1*   No results for input(s): LIPASE, AMYLASE in the last 168 hours. No results for input(s): AMMONIA in the last 168 hours. Coagulation Profile: Recent Labs  Lab 09/26/17 1143 09/27/17 0600  INR 2.55 2.12   Cardiac Enzymes: No results for input(s): CKTOTAL, CKMB, CKMBINDEX, TROPONINI in the last 168 hours. BNP (last 3 results) No results for input(s): PROBNP in the last 8760 hours. HbA1C: Recent Labs    09/27/17 0600  HGBA1C 7.6*   CBG: Recent Labs  Lab 09/26/17 2109 09/27/17 0749 09/27/17 1133  GLUCAP 71 105* 161*   Lipid Profile: Recent Labs    09/27/17 0600  CHOL 136  HDL 48  LDLCALC 69  TRIG 95  CHOLHDL 2.8   Thyroid Function Tests: No results for input(s): TSH, T4TOTAL, FREET4, T3FREE, THYROIDAB in the last 72 hours. Anemia Panel: No results for input(s): VITAMINB12, FOLATE, FERRITIN, TIBC, IRON, RETICCTPCT in the last 72 hours. Urine analysis:    Component Value Date/Time   COLORURINE YELLOW 09/02/2017 1354   APPEARANCEUR CLEAR 09/02/2017 1354   LABSPEC 1.010 09/02/2017 1354   PHURINE 5.0 09/02/2017 1354   GLUCOSEU NEGATIVE 09/02/2017 1354   HGBUR NEGATIVE 09/02/2017 1354   [place] 09/02/2017 1354   KETONESUR NEGATIVE 09/02/2017 1354   PROTEINUR NEGATIVE 09/02/2017 1354   NITRITE NEGATIVE 09/02/2017 1354   LEUKOCYTESUR NEGATIVE 09/02/2017 1354   Sepsis  Labs: @LABRCNTIP (procalcitonin:4,lacticidven:4)  ) Recent Results (from the past 240 hour(s))  Culture, blood (routine x 2)     Status: None (Preliminary result)   Collection Time: 09/26/17  9:25 PM  Result Value Ref Range Status   Specimen Description BLOOD RIGHT HAND  Final   Special Requests   Final    BOTTLES DRAWN AEROBIC ONLY Blood Culture adequate volume   Culture   Final    NO GROWTH < 12 HOURS Performed at [facility], [place] [address], [place], [place] [address]    Report Status PENDING  Incomplete  Culture, blood (routine x 2)     Status: None (Preliminary result)   Collection Time: 09/26/17  9:30 PM  Result Value Ref Range Status   Specimen Description BLOOD RIGHT HAND  Final   Special Requests   Final    BOTTLES DRAWN AEROBIC ONLY Blood Culture adequate volume   Culture   Final    NO GROWTH < 12 HOURS Performed at [facility], [place] [address], [place], [place] [address]    Report Status PENDING  Incomplete      Studies: Dg Tibia/fibula Right  Result  Date: 09/26/2017 CLINICAL DATA:  Cellulitis of the right leg EXAM: RIGHT TIBIA AND FIBULA - 2 VIEW COMPARISON:  None. FINDINGS: No fracture or malalignment. No periostitis or bone destruction. Vascular calcifications. No soft tissue gas. IMPRESSION: No acute osseous abnormality Electronically Signed   By: Donavan Foil M.D.   On: 09/26/2017 19:40   Dg Foot Complete Right  Result Date: 09/26/2017 CLINICAL DATA:  Cellulitis EXAM: RIGHT FOOT COMPLETE - 3+ VIEW COMPARISON:  None. FINDINGS: No fracture or malalignment. Degenerative changes at the first MTP joint. Vascular calcifications. Moderate plantar calcaneal spur. Soft tissue edema without soft tissue gas or radiopaque foreign body. IMPRESSION: No acute osseous abnormality Electronically Signed   By: Donavan Foil M.D.   On: 09/26/2017 19:42    Scheduled Meds: . amLODipine  5 mg Oral Daily  . carvedilol  6.25 mg Oral BID WC  . cholecalciferol  1,000 Units  Oral Daily  . doxycycline  100 mg Oral Q12H  . feeding supplement (GLUCERNA SHAKE)  237 mL Oral Q24H  . furosemide  40 mg Intravenous BID  . insulin aspart  0-9 Units Subcutaneous TID WC  . latanoprost  1 drop Both Eyes QHS  . pravastatin  40 mg Oral q1800  . sodium chloride flush  3 mL Intravenous Q12H  . Warfarin - Pharmacist Dosing Inpatient   Does not apply q1800    Continuous Infusions:   LOS: 0 days     Alma Friendly, MD [facility]   If 7PM-7AM, please contact night-coverage [email or web address] Password [email or web address] 09/27/2017, 12:33 PM

## 2017-09-27 NOTE — Progress Notes (Addendum)
Progress Note  Patient Name: Patricia Randall Date of Encounter: 09/27/2017  Primary Cardiologist: Dr. Haroldine Laws (last seen in 2015)  Subjective   Feels better today. Breathing improved. Denies CP. Notes ulcer on distal, posterior aspect of right lower extremity. She is a diabetic. Pt notes first noticing ulcer ~3 weeks ago.   Inpatient Medications    Scheduled Meds: . amLODipine  5 mg Oral Daily  . cholecalciferol  1,000 Units Oral Daily  . doxycycline  100 mg Oral Q12H  . feeding supplement (GLUCERNA SHAKE)  237 mL Oral Q24H  . furosemide  40 mg Intravenous BID  . insulin aspart  0-9 Units Subcutaneous TID WC  . latanoprost  1 drop Both Eyes QHS  . pravastatin  40 mg Oral q1800  . sodium chloride flush  3 mL Intravenous Q12H  . Warfarin - Pharmacist Dosing Inpatient   Does not apply q1800   Continuous Infusions:  PRN Meds: acetaminophen **OR** acetaminophen, albuterol, bisacodyl, hydrALAZINE, ondansetron **OR** ondansetron (ZOFRAN) IV, polyethylene glycol, traMADol, traZODone   Vital Signs    Vitals:   09/27/17 0424 09/27/17 0627 09/27/17 0751 09/27/17 0855  BP:  (!) 138/106 118/80 128/74  Pulse:  (!) 42 (!) 45 (!) 47  Resp:   18 19  Temp:  (!) 97.5 F (36.4 C) 97.6 F (36.4 C) 97.7 F (36.5 C)  TempSrc:  Oral Oral Oral  SpO2:  95% 99% 98%  Weight: 146 lb 4.8 oz (66.4 kg)     Height:        Intake/Output Summary (Last 24 hours) at 09/27/2017 0913 Last data filed at 09/27/2017 0424 Gross per 24 hour  Intake 240 ml  Output 1050 ml  Net -810 ml   Filed Weights   09/26/17 2113 09/27/17 0424  Weight: 149 lb 1.6 oz (67.6 kg) 146 lb 4.8 oz (66.4 kg)    Telemetry    Atrial fibrillation in the 90s - Personally Reviewed  ECG    atrial fibrillation, 85 bpm  - Personally Reviewed  Physical Exam   GEN: No acute distress.   Neck: No JVD Cardiac: irregularlly irregular rhythm, regular rate, no murmurs, rubs, or gallops.  Respiratory: faint crackles in the RLL. GI:  Soft, nontender, non-distended  MS: trace bilateral ankle edema; shallow ulcer on posterior aspect of distal RLE. No deformity. Neuro:  Nonfocal  Psych: Normal affect   Labs    Chemistry Recent Labs  Lab 09/26/17 1143 09/27/17 0600  NA 138 139  K 4.6 3.9  CL 103 103  CO2 22 23  GLUCOSE 163* 103*  BUN 44* 40*  CREATININE 2.22* 2.09*  CALCIUM 9.3 9.5  PROT 6.4*  --   ALBUMIN 3.1*  --   AST 23  --   ALT 21  --   ALKPHOS 74  --   BILITOT 0.5  --   GFRNONAA 19* 20*  GFRAA 22* 24*  ANIONGAP 13 13     Hematology Recent Labs  Lab 09/26/17 1143 09/27/17 0600  WBC 4.1 3.6*  RBC 4.76 5.23*  HGB 12.8 14.1  HCT 39.3 42.8  MCV 82.6 81.8  MCH 26.9 27.0  MCHC 32.6 32.9  RDW 17.0* 17.0*  PLT 215 200    Cardiac EnzymesNo results for input(s): TROPONINI in the last 168 hours.  Recent Labs  Lab 09/26/17 1154  TROPIPOC 0.00     BNP Recent Labs  Lab 09/26/17 1143  BNP 585.3*     DDimer No results for input(s): DDIMER in  the last 168 hours.   Radiology    Dg Chest 2 View  Result Date: 09/26/2017 CLINICAL DATA:  Shortness of breath when laying down EXAM: CHEST - 2 VIEW COMPARISON:  07/05/2017 FINDINGS: Chronic cardiopericardial enlargement. Interstitial coarsening seen previously is regressed/resolved. Interstitial crowding without focal opacity. No Kerley lines, effusion, or pneumothorax. Diffuse degenerative endplate spurring. IMPRESSION: Chronic cardiopericardial enlargement.  No convincing edema. Electronically Signed   By: Monte Fantasia M.D.   On: 09/26/2017 12:15   Dg Tibia/fibula Right  Result Date: 09/26/2017 CLINICAL DATA:  Cellulitis of the right leg EXAM: RIGHT TIBIA AND FIBULA - 2 VIEW COMPARISON:  None. FINDINGS: No fracture or malalignment. No periostitis or bone destruction. Vascular calcifications. No soft tissue gas. IMPRESSION: No acute osseous abnormality Electronically Signed   By: Donavan Foil M.D.   On: 09/26/2017 19:40   Dg Foot Complete  Right  Result Date: 09/26/2017 CLINICAL DATA:  Cellulitis EXAM: RIGHT FOOT COMPLETE - 3+ VIEW COMPARISON:  None. FINDINGS: No fracture or malalignment. Degenerative changes at the first MTP joint. Vascular calcifications. Moderate plantar calcaneal spur. Soft tissue edema without soft tissue gas or radiopaque foreign body. IMPRESSION: No acute osseous abnormality Electronically Signed   By: Donavan Foil M.D.   On: 09/26/2017 19:42    Cardiac Studies   2D Echo 09/27/17- pending   Patient Profile     Patricia Randall is a 84 y.o. female with a hx of systolic heart failure with initial LVEF of 20-25% in 2014 with repeat echo with improvement in LVEF to 60-65% in 2015, atrial fibrillation on Coumadin (s/p failed TEE/DCCV in 2014), LE claudication, HLD, HTN and DM II who is being seen for the evaluation of SOB and orthopnea worrisome for acute CHF at the request of Dr. Ralene Bathe.   Assessment & Plan    1. Acute CHF: Original echocardiogram in 2014 showed EF of 20-25%. Repeat echo in 2015 showed improvement in LVEF to 60-65%, however given the degree of cardiomegaly noted on CXR this admit, suspect underlying dilated cardiomyopathy/ systolic HF. 2D echo has been completed and results pending. Pt was symptomatic w/ dyspnea and volume overloaded with elevated JVD and 1-2+ bilateral LEE on admit. BNP 585.3. IV Lasix was given. -1L UOP yesterday. Weight is down from 149 lb>>146 lb. SCr improved from 2.22>>2.09. K stable at 3.9. BP stable. Breathing improved with still with trace edema on exam and right sided crackles. Continue with IV Lasix. Recommending resuming Coreg, 9.375 mg BID (was on this outpatient). Her home lisinopril is on hold given abnormal renal function.  Continue strict I/Os, daily weights and daily BMP to monitor renal function and electrolytes. Low sodium diet. If unable to resume lisinopril due to renal function, can add hydralazine + nitrates.   2. Persistent Atrial Fibrillation: on Coreg as outpatient  but held on admit by admitting MD. BP is stable. Vrates in the 90s. Recommend resuming Coreg, 9.375 mg BID. On coumadin for a/c. INR is therapeutic at 2.12. Pharmacy is following and assisting with coumadin dosing.  3. Chronic Kidney Disease, Stage III: SCr improved slightly with diuresis, down from 2.22 on admit to 2.09 today. Baseline of 1.5-1.7. Continue to monitor. Home lisinopril remains on hold.   4. LE Ulcer: shallow ulcer on posterior aspect of distal RLE. Pt is a diabetic and reports first noticing wound ~3 weeks ago. Wound care RN present at beside applying dressing. Will need close monitoring and wound care f/u post discharge. May also consider w/u for PAD.  We can arrange outpatient LE arterial dopplers in our office.   5. DM: Hgb A1c 7.6. On Amaryl as outpatient. ACE-I on hold due to renal function. Management per primary.   6. HTN: currently controlled.  130/90. Recommend restarting home Coreg for HF and atrial fibrillation.   7. HLD: on simvastatin. LDL is well controlled at 69 mg/dL. HDL good at 48. TG controlled at 95. Total cholesterol 136.   For questions or updates, please contact [place] Please consult [email or web address] for contact info under Cardiology/STEMI.      Signed, Lyda Jester, PA-C  09/27/2017, 9:13 AM    Personally seen and examined. Agree with above.  Breathing a little better. No CP Exam: Irreg irreg, no increased work of breathing, reduced LE edema. Mild erythema LLE calf.   Labs: Creat 2.2 to 2.0 Tele: AFIB stable HR  ECHO: pending read  A/P  - Acute systolic HF - continue with IV lasix 40 BID. More fluid to go.   - Resuming home coreg.   - DM with LE ulcer - appreciate wound nurse  - HL - simvastatin  - AFIB - permanent. Stable on tele.   Candee Furbish, MD

## 2017-09-28 DIAGNOSIS — I5021 Acute systolic (congestive) heart failure: Secondary | ICD-10-CM | POA: Diagnosis not present

## 2017-09-28 DIAGNOSIS — I482 Chronic atrial fibrillation: Secondary | ICD-10-CM | POA: Diagnosis not present

## 2017-09-28 DIAGNOSIS — I420 Dilated cardiomyopathy: Secondary | ICD-10-CM | POA: Diagnosis not present

## 2017-09-28 DIAGNOSIS — N184 Chronic kidney disease, stage 4 (severe): Secondary | ICD-10-CM | POA: Diagnosis not present

## 2017-09-28 LAB — CBC WITH DIFFERENTIAL/PLATELET
BASOS PCT: 1 %
Basophils Absolute: 0 10*3/uL (ref 0.0–0.1)
EOS PCT: 4 %
Eosinophils Absolute: 0.2 10*3/uL (ref 0.0–0.7)
HCT: 46.2 % — ABNORMAL HIGH (ref 36.0–46.0)
Hemoglobin: 15.5 g/dL — ABNORMAL HIGH (ref 12.0–15.0)
LYMPHS ABS: 1.6 10*3/uL (ref 0.7–4.0)
Lymphocytes Relative: 42 %
MCH: 28 pg (ref 26.0–34.0)
MCHC: 33.5 g/dL (ref 30.0–36.0)
MCV: 83.4 fL (ref 78.0–100.0)
MONO ABS: 0.3 10*3/uL (ref 0.1–1.0)
Monocytes Relative: 8 %
NEUTROS ABS: 1.8 10*3/uL (ref 1.7–7.7)
Neutrophils Relative %: 45 %
PLATELETS: 205 10*3/uL (ref 150–400)
RBC: 5.54 MIL/uL — AB (ref 3.87–5.11)
RDW: 17.3 % — AB (ref 11.5–15.5)
WBC: 3.9 10*3/uL — AB (ref 4.0–10.5)

## 2017-09-28 LAB — GLUCOSE, CAPILLARY
Glucose-Capillary: 138 mg/dL — ABNORMAL HIGH (ref 65–99)
Glucose-Capillary: 149 mg/dL — ABNORMAL HIGH (ref 65–99)
Glucose-Capillary: 136 mg/dL — ABNORMAL HIGH (ref 65–99)
Glucose-Capillary: 207 mg/dL — ABNORMAL HIGH (ref 65–99)

## 2017-09-28 LAB — BASIC METABOLIC PANEL
Anion gap: 16 — ABNORMAL HIGH (ref 5–15)
BUN: 46 mg/dL — AB (ref 6–20)
CALCIUM: 9.7 mg/dL (ref 8.9–10.3)
CO2: 21 mmol/L — AB (ref 22–32)
CREATININE: 2.08 mg/dL — AB (ref 0.44–1.00)
Chloride: 101 mmol/L (ref 101–111)
GFR calc Af Amer: 24 mL/min — ABNORMAL LOW (ref >60)
GFR, EST NON AFRICAN AMERICAN: 20 mL/min — AB (ref >60)
GLUCOSE: 138 mg/dL — AB (ref 65–99)
Potassium: 4 mmol/L (ref 3.5–5.1)
Sodium: 138 mmol/L (ref 135–145)

## 2017-09-28 LAB — PROTIME-INR
INR: 1.78
Prothrombin Time: 20.5 seconds — ABNORMAL HIGH (ref 11.4–15.2)

## 2017-09-28 MED ORDER — FUROSEMIDE 40 MG PO TABS
40.0000 mg | ORAL_TABLET | Freq: Every day | ORAL | Status: DC
  Administered 2017-09-28: 40 mg via ORAL
  Filled 2017-09-28: qty 1

## 2017-09-28 MED ORDER — CARVEDILOL 3.125 MG PO TABS
3.1250 mg | ORAL_TABLET | Freq: Once | ORAL | Status: AC
  Administered 2017-09-28: 3.125 mg via ORAL
  Filled 2017-09-28: qty 1

## 2017-09-28 MED ORDER — CARVEDILOL 6.25 MG PO TABS
9.3750 mg | ORAL_TABLET | Freq: Two times a day (BID) | ORAL | Status: DC
  Administered 2017-09-29 – 2017-09-30 (×4): 9.375 mg via ORAL
  Filled 2017-09-28 (×5): qty 1

## 2017-09-28 MED ORDER — WARFARIN SODIUM 3 MG PO TABS
3.0000 mg | ORAL_TABLET | Freq: Once | ORAL | Status: AC
  Administered 2017-09-28: 3 mg via ORAL
  Filled 2017-09-28: qty 1

## 2017-09-28 NOTE — Progress Notes (Signed)
Progress Note  Patient Name: Patricia Randall Date of Encounter: 09/28/2017  Primary Cardiologist: No primary care provider on file.   Subjective   Continues to feel little bit better.  No chest pain.  Appreciate wound care looking at distal ulcer.  Inpatient Medications    Scheduled Meds: . carvedilol  6.25 mg Oral BID WC  . cholecalciferol  1,000 Units Oral Daily  . doxycycline  100 mg Oral Q12H  . feeding supplement (GLUCERNA SHAKE)  237 mL Oral Q24H  . furosemide  40 mg Intravenous BID  . insulin aspart  0-9 Units Subcutaneous TID WC  . latanoprost  1 drop Both Eyes QHS  . pravastatin  40 mg Oral q1800  . sodium chloride flush  3 mL Intravenous Q12H  . Warfarin - Pharmacist Dosing Inpatient   Does not apply q1800   Continuous Infusions:  PRN Meds: acetaminophen **OR** acetaminophen, albuterol, bisacodyl, hydrALAZINE, ondansetron **OR** ondansetron (ZOFRAN) IV, polyethylene glycol, traMADol, traZODone   Vital Signs    Vitals:   09/28/17 0127 09/28/17 0551 09/28/17 0552 09/28/17 0600  BP: (!) 126/97 105/71 105/71   Pulse: (!) 107 84 84   Resp:  20 20   Temp:   97.6 F (36.4 C)   TempSrc:   Oral   SpO2: 100% 95% 95%   Weight:    144 lb 4.8 oz (65.5 kg)  Height:        Intake/Output Summary (Last 24 hours) at 09/28/2017 0832 Last data filed at 09/28/2017 0126 Gross per 24 hour  Intake 963 ml  Output 325 ml  Net 638 ml   Filed Weights   09/26/17 2113 09/27/17 0424 09/28/17 0600  Weight: 149 lb 1.6 oz (67.6 kg) 146 lb 4.8 oz (66.4 kg) 144 lb 4.8 oz (65.5 kg)    Telemetry    Atrial fibrillation heart rate in the 90s- Personally Reviewed  ECG    Atrial fibrillation heart rate 85- Personally Reviewed  Physical Exam   GEN: No acute distress.  Elderly Neck: No JVD Cardiac:  Irregularly irregular, normal heart rate, no murmurs, rubs, or gallops.  Respiratory:  Minimal right-sided crackles.Marland Kitchen GI: Soft, nontender, non-distended  MS:  Trace bilateral  pretibial/ankle edema; No deformity.  Shallow ulcer right lower extremity distal Neuro:  Nonfocal  Psych: Normal affect   Labs    Chemistry Recent Labs  Lab 09/26/17 1143 09/27/17 0600 09/28/17 0455  NA 138 139 138  K 4.6 3.9 4.0  CL 103 103 101  CO2 22 23 21*  GLUCOSE 163* 103* 138*  BUN 44* 40* 46*  CREATININE 2.22* 2.09* 2.08*  CALCIUM 9.3 9.5 9.7  PROT 6.4*  --   --   ALBUMIN 3.1*  --   --   AST 23  --   --   ALT 21  --   --   ALKPHOS 74  --   --   BILITOT 0.5  --   --   GFRNONAA 19* 20* 20*  GFRAA 22* 24* 24*  ANIONGAP 13 13 16*     Hematology Recent Labs  Lab 09/26/17 1143 09/27/17 0600 09/28/17 0455  WBC 4.1 3.6* 3.9*  RBC 4.76 5.23* 5.54*  HGB 12.8 14.1 15.5*  HCT 39.3 42.8 46.2*  MCV 82.6 81.8 83.4  MCH 26.9 27.0 28.0  MCHC 32.6 32.9 33.5  RDW 17.0* 17.0* 17.3*  PLT 215 200 205    Cardiac EnzymesNo results for input(s): TROPONINI in the last 168 hours.  Recent Labs  Lab 09/26/17 1154  TROPIPOC 0.00     BNP Recent Labs  Lab 09/26/17 1143  BNP 585.3*     DDimer No results for input(s): DDIMER in the last 168 hours.   Radiology    Dg Chest 2 View  Result Date: 09/26/2017 CLINICAL DATA:  Shortness of breath when laying down EXAM: CHEST - 2 VIEW COMPARISON:  07/05/2017 FINDINGS: Chronic cardiopericardial enlargement. Interstitial coarsening seen previously is regressed/resolved. Interstitial crowding without focal opacity. No Kerley lines, effusion, or pneumothorax. Diffuse degenerative endplate spurring. IMPRESSION: Chronic cardiopericardial enlargement.  No convincing edema. Electronically Signed   By: Monte Fantasia M.D.   On: 09/26/2017 12:15   Dg Tibia/fibula Right  Result Date: 09/26/2017 CLINICAL DATA:  Cellulitis of the right leg EXAM: RIGHT TIBIA AND FIBULA - 2 VIEW COMPARISON:  None. FINDINGS: No fracture or malalignment. No periostitis or bone destruction. Vascular calcifications. No soft tissue gas. IMPRESSION: No acute osseous  abnormality Electronically Signed   By: Donavan Foil M.D.   On: 09/26/2017 19:40   Dg Foot Complete Right  Result Date: 09/26/2017 CLINICAL DATA:  Cellulitis EXAM: RIGHT FOOT COMPLETE - 3+ VIEW COMPARISON:  None. FINDINGS: No fracture or malalignment. Degenerative changes at the first MTP joint. Vascular calcifications. Moderate plantar calcaneal spur. Soft tissue edema without soft tissue gas or radiopaque foreign body. IMPRESSION: No acute osseous abnormality Electronically Signed   By: Donavan Foil M.D.   On: 09/26/2017 19:42    Cardiac Studies   Echocardiogram 09/27/17-EF 30%  Patient Profile     84 y.o. female with acute systolic heart failure, diabetes with hypertension hyperlipidemia, permanent atrial fibrillation, chronic kidney disease stage III with lower extremity ulcer.  Assessment & Plan    Acute on chronic diastolic and systolic heart failure -Ejection fraction once again 30%.  Prior echocardiogram several years ago showed resolution of EF after it was 20-25% originally.  Her chest x-ray shows marked cardiomegaly. -On furosemide 40 mg IV twice a day, her weight is down another 2 pounds, 144 currently.  She put out net of -810 mL yesterday. -Creatinine is stable 2.1 range.  Stable potassium. -Back on carvedilol, not on ACE inhibitor because of renal function.  Blood pressure slightly lower given diuresis.  I would like to hold off on hydralazine isosorbide at this time.  Permanent atrial fibrillation -Continue with rate control strategy.  Failed cardioversion in the past.  Rates currently in the 90s.  Coumadin therapeutic.  Chronic kidney disease stage III -2.1.  Stable.  She is not on her home lisinopril.  Previous baseline was 1.5-1.7.  We will hold off on ACE inhibitor for now.  Diabetes with lower extremity ulcer, hypertension, hyperlipidemia -Appreciate wound care team.  Consider further workup for peripheral arterial disease as outpatient with arterial Dopplers.   Hemoglobin A1c 7.6.  I think it makes sense to discontinue her Amaryl as outpatient given her cardiac function.  Continue with simvastatin.  Blood pressure under good control.  Slightly soft this morning.  Potential discharge for tomorrow  For questions or updates, please contact [place] Please consult [email or web address] for contact info under Cardiology/STEMI.      Signed, Candee Furbish, MD  09/28/2017, 8:32 AM

## 2017-09-28 NOTE — Progress Notes (Signed)
Applied silvadene cream on patients right foot wound. Placed pink foam over area. Patient tolerated well.

## 2017-09-28 NOTE — Progress Notes (Signed)
PROGRESS NOTE  Patricia Randall OVF:[id number] DOB: [birth year] DOA: 09/26/2017 PCP: Leonard Downing, MD  HPI/Recap of past 24 hours: Patricia Randall is a 84 y.o. female with medical history significant for CHF(improved EF 60-65% in 2015), permanent A. fib on Coumadin, hyperlipidemia, hypertension, diabetes mellitus type 2 presents to the ER complaining of shortness of breath, orthopnea for the past few weeks. Patient denies any bilateral lower extremity swelling, but does complain of ulcer/wound noted on her right leg for the past 3 weeks.  Patient also reported right lower extremity erythema, warmth, swelling that has worsened for the past few days.  Unsure if she received any antibiotics from her PCP.  Patient reports compliance to all her medications but noted that her PCP recently stopped her lisinopril. In the ED, CXR showed cardiac enlargement with no pulmonary vascular congestion, BNP 585.  ED Trop x1-, EKG showed A. fib with heart rate controlled, with T wave inversion unchanged from previous. Creatinine elevated at 2.2, worsening from baseline. Cardiology consulted, pt admitted for further management.  Today, feeling better, no chest pain or shortness of breath.  Redness in the leg is also getting better.  Assessment/Plan: Principal Problem:   Cardiomyopathy ([place]) Active Problems:   Permanent atrial fibrillation (HCC)   Chronic diastolic congestive heart failure (HCC)   Essential hypertension, benign   Chronic kidney disease, stage IV (severe) (HCC)   CHF (congestive heart failure) (HCC)  Acute on chronic systolic heart failure Presents with orthopnea, SOB, elevated BNP 585 ECHO on 09/27/17: EF of 30-35%, diffuse hypokinesis. Last echo in 2015 showed LVEF of 60-65% Chest x-ray showed cardiomegaly with no vascular pulmonary congestion Cardiology consulted: IV Lasix 40 mg twice daily, switching to oral. Continue coreg Daily weights, strict I's and O's Telemetry, monitor closely  RLE  cellulitis with R leg ulcer/wound Afebrile, no leukocytosis BC X 2 pending Right lower extremity Doppler showed no DVT or SVT Right foot wound, no drainage noted XRAY RLE & rt foot no acute abnormalities Continue PO doxycycline  Wound care consulted  AKI on CKD stage 3 Improving Baseline Cr 1.5-1.7, currently 2.2 on admission ? Renal congestion from CHF Continue diuresis, with close monitoring of renal function Avoid nephrotoxic's, renally dose all meds Daily BMP  Permanent A. fib Rate controlled Continue coreg Continue Coumadin, dosing per pharmacy   Type 2 diabetes mellitus A1c 7.6 SSI, Accu-Cheks Held home glimepiride  Hypertension Started amlodipine, held lisinopril due to AK I IV hydralazine as needed  Hyperlipidemia LDL 69 at goal Continue statins      Code Status: Full  Family Communication: None at bedside  Disposition Plan: Home once stable   Consultants:  Cardiology  Procedures:  None  Antimicrobials:  Doxycycline  DVT prophylaxis: Coumadin   Objective: Vitals:   09/28/17 1147 09/28/17 1523 09/28/17 1737 09/28/17 1738  BP: 98/76 101/75 (!) 94/57 (!) 94/57  Pulse: 72 75 78 78  Resp:      Temp: (!) 97.3 F (36.3 C)     TempSrc: Oral     SpO2: 96%  95% 95%  Weight:      Height:        Intake/Output Summary (Last 24 hours) at 09/28/2017 1805 Last data filed at 09/28/2017 1154 Gross per 24 hour  Intake 963 ml  Output 125 ml  Net 838 ml   Filed Weights   09/26/17 2113 09/27/17 0424 09/28/17 0600  Weight: 67.6 kg (149 lb 1.6 oz) 66.4 kg (146 lb 4.8 oz) 65.5 kg (144  lb 4.8 oz)    Exam:   General: NAD  Cardiovascular: S1, S2 present irregular  Respiratory: Bibasilar crackles  Abdomen: Soft, nontender, nondistended, bowel sounds present  Musculoskeletal: Right lower extremity with edema, erythema, tenderness  Skin: Normal except for above  Psychiatry: Normal mood   Data Reviewed: CBC: Recent Labs  Lab  09/26/17 1143 09/27/17 0600 09/28/17 0455  WBC 4.1 3.6* 3.9*  NEUTROABS  --   --  1.8  HGB 12.8 14.1 15.5*  HCT 39.3 42.8 46.2*  MCV 82.6 81.8 83.4  PLT 215 200 709   Basic Metabolic Panel: Recent Labs  Lab 09/26/17 1143 09/27/17 0600 09/28/17 0455  NA 138 139 138  K 4.6 3.9 4.0  CL 103 103 101  CO2 22 23 21*  GLUCOSE 163* 103* 138*  BUN 44* 40* 46*  CREATININE 2.22* 2.09* 2.08*  CALCIUM 9.3 9.5 9.7   GFR: Estimated Creatinine Clearance: 16.4 mL/min (A) (by C-G formula based on SCr of 2.08 mg/dL (H)). Liver Function Tests: Recent Labs  Lab 09/26/17 1143  AST 23  ALT 21  ALKPHOS 74  BILITOT 0.5  PROT 6.4*  ALBUMIN 3.1*   No results for input(s): LIPASE, AMYLASE in the last 168 hours. No results for input(s): AMMONIA in the last 168 hours. Coagulation Profile: Recent Labs  Lab 09/26/17 1143 09/27/17 0600 09/28/17 0455  INR 2.55 2.12 1.78   Cardiac Enzymes: No results for input(s): CKTOTAL, CKMB, CKMBINDEX, TROPONINI in the last 168 hours. BNP (last 3 results) No results for input(s): PROBNP in the last 8760 hours. HbA1C: Recent Labs    09/27/17 0600  HGBA1C 7.6*   CBG: Recent Labs  Lab 09/27/17 1618 09/27/17 2105 09/28/17 0756 09/28/17 1145 09/28/17 1645  GLUCAP 145* 110* 138* 149* 136*   Lipid Profile: Recent Labs    09/27/17 0600  CHOL 136  HDL 48  LDLCALC 69  TRIG 95  CHOLHDL 2.8   Thyroid Function Tests: No results for input(s): TSH, T4TOTAL, FREET4, T3FREE, THYROIDAB in the last 72 hours. Anemia Panel: No results for input(s): VITAMINB12, FOLATE, FERRITIN, TIBC, IRON, RETICCTPCT in the last 72 hours. Urine analysis:    Component Value Date/Time   COLORURINE YELLOW 09/02/2017 1354   APPEARANCEUR CLEAR 09/02/2017 1354   LABSPEC 1.010 09/02/2017 1354   PHURINE 5.0 09/02/2017 1354   GLUCOSEU NEGATIVE 09/02/2017 1354   HGBUR NEGATIVE 09/02/2017 1354   [place] 09/02/2017 1354   KETONESUR NEGATIVE 09/02/2017 1354    PROTEINUR NEGATIVE 09/02/2017 1354   NITRITE NEGATIVE 09/02/2017 1354   LEUKOCYTESUR NEGATIVE 09/02/2017 1354   Sepsis Labs: @LABRCNTIP (procalcitonin:4,lacticidven:4)  ) Recent Results (from the past 240 hour(s))  Culture, blood (routine x 2)     Status: None (Preliminary result)   Collection Time: 09/26/17  9:25 PM  Result Value Ref Range Status   Specimen Description BLOOD RIGHT HAND  Final   Special Requests   Final    BOTTLES DRAWN AEROBIC ONLY Blood Culture adequate volume   Culture   Final    NO GROWTH 2 DAYS Performed at [facility], [place] [address] [place], [place] [address]    Report Status PENDING  Incomplete  Culture, blood (routine x 2)     Status: None (Preliminary result)   Collection Time: 09/26/17  9:30 PM  Result Value Ref Range Status   Specimen Description BLOOD RIGHT HAND  Final   Special Requests   Final    BOTTLES DRAWN AEROBIC ONLY Blood Culture adequate volume  Culture   Final    NO GROWTH 2 DAYS Performed at [facility], [place] [address] [place], [place] [address]    Report Status PENDING  Incomplete      Studies: No results found.  Scheduled Meds: . carvedilol  9.375 mg Oral BID WC  . cholecalciferol  1,000 Units Oral Daily  . doxycycline  100 mg Oral Q12H  . feeding supplement (GLUCERNA SHAKE)  237 mL Oral Q24H  . furosemide  40 mg Oral Daily  . insulin aspart  0-9 Units Subcutaneous TID WC  . latanoprost  1 drop Both Eyes QHS  . pravastatin  40 mg Oral q1800  . sodium chloride flush  3 mL Intravenous Q12H  . Warfarin - Pharmacist Dosing Inpatient   Does not apply q1800    Continuous Infusions:   LOS: 0 days     Berle Mull, MD [facility]   If 7PM-7AM, please contact night-coverage [email or web address] Password [facility] 09/28/2017, 6:05 PM

## 2017-09-28 NOTE — Progress Notes (Addendum)
[place] for warfarin Indication: atrial fibrillation  No Known Allergies  Patient Measurements: Height: 5' (152.4 cm) Weight: 144 lb 4.8 oz (65.5 kg) IBW/kg (Calculated) : 45.5 Heparin Dosing Weight: n/a   Vital Signs: Temp: 97.6 F (36.4 C) (04/10 0552) Temp Source: Oral (04/10 0552) BP: 90/67 (04/10 0940) Pulse Rate: 92 (04/10 0940)  Labs: Recent Labs    09/26/17 1143 09/27/17 0600 09/28/17 0455  HGB 12.8 14.1 15.5*  HCT 39.3 42.8 46.2*  PLT 215 200 205  LABPROT 27.2* 23.6* 20.5*  INR 2.55 2.12 1.78  CREATININE 2.22* 2.09* 2.08*   Estimated Creatinine Clearance: 16.4 mL/min (A) (by C-G formula based on SCr of 2.08 mg/dL (H)).  Medical History: Past Medical History:  Diagnosis Date  . Atrial fibrillation ([place])    a. 03/2013 s/p TEE/DCCV;  b. 03/2013 Eliquis initiated.  . Chronic systolic CHF (congestive heart failure) ([place])    a. 03/2013 Echo: EF 20-25%, diff HK, mild to mod MR, sev dil LA, mild RV dysfxn, sev dil RA, mod TR.  . Claudication ([place])   . Glaucoma   . High cholesterol   . Hypertension   . Type II diabetes mellitus (HCC)     Medications:  Medications Prior to Admission  Medication Sig Dispense Refill Last Dose  . Biotin 10 MG CAPS Take 10 mg by mouth daily.   Past Week at Unknown time  . carvedilol (COREG) 6.25 MG tablet TAKE 1 AND 1/2 TABLETS BY MOUTH TWICE A DAY WITH A MEAL 90 tablet 2 09/26/2017 at 0800  . cholecalciferol (VITAMIN D) 1000 units tablet Take 1,000 Units by mouth daily.   Past Week at Unknown time  . feeding supplement, GLUCERNA SHAKE, (GLUCERNA SHAKE) LIQD Take 237 mLs by mouth daily.   09/26/2017 at Unknown time  . furosemide (LASIX) 20 MG tablet Take 1 tablet (20 mg total) by mouth daily. 30 tablet 0 09/26/2017 at Unknown time  . glimepiride (AMARYL) 4 MG tablet Take 6-8 mg by mouth daily before breakfast.    09/26/2017 at Unknown time  . latanoprost (XALATAN) 0.005 % ophthalmic solution Place 1  drop into both eyes at bedtime.   09/25/2017 at Unknown time  . lovastatin (MEVACOR) 40 MG tablet Take 40 mg by mouth at bedtime.   09/25/2017 at Unknown time  . warfarin (COUMADIN) 2 MG tablet Take 1 tablet (2 mg total) by mouth daily. (Patient taking differently: Take 1-2 mg by mouth daily. Take 2 mg for two days then 1 mg for a day then 2 mg for the next two days then 1 mg.) 4 tablet 0 09/26/2017 at Unknown time  . lisinopril (PRINIVIL,ZESTRIL) 40 MG tablet Take 40 mg by mouth daily.  0     Assessment: Patricia Randall on Coumadin 2mg  x 2 days, then 1mg  x 1 day and repeat PTA for Afib.   INR down and subtherapeutic: 2.12>1.78, CBC stable WNL, no overt bleeding documented. Paged MD to confirm if bridge therapy is needed. Given age and other risk factors, will wait to initiate bridge therapy and continue to monitor INR closely.   Goal of Therapy:  INR 2-3 Monitor platelets by anticoagulation protocol: Yes   Plan:  Coumadin 3mg  PO x 1 Monitor daily INR, CBC, s/s of bleed  Georga Bora, PharmD Clinical Pharmacist 09/28/2017 10:42 AM

## 2017-09-28 NOTE — Plan of Care (Signed)
  Problem: Activity: Goal: Risk for activity intolerance will decrease Outcome: Progressing   Problem: Safety: Goal: Ability to remain free from injury will improve Outcome: Progressing   Problem: Activity: Goal: Capacity to carry out activities will improve Outcome: Progressing   

## 2017-09-29 DIAGNOSIS — I482 Chronic atrial fibrillation: Secondary | ICD-10-CM | POA: Diagnosis not present

## 2017-09-29 DIAGNOSIS — I5021 Acute systolic (congestive) heart failure: Secondary | ICD-10-CM | POA: Diagnosis not present

## 2017-09-29 LAB — BASIC METABOLIC PANEL
ANION GAP: 14 (ref 5–15)
BUN: 56 mg/dL — ABNORMAL HIGH (ref 6–20)
CALCIUM: 9.2 mg/dL (ref 8.9–10.3)
CHLORIDE: 102 mmol/L (ref 101–111)
CO2: 23 mmol/L (ref 22–32)
Creatinine, Ser: 2.86 mg/dL — ABNORMAL HIGH (ref 0.44–1.00)
GFR calc non Af Amer: 14 mL/min — ABNORMAL LOW (ref >60)
GFR, EST AFRICAN AMERICAN: 16 mL/min — AB (ref >60)
GLUCOSE: 114 mg/dL — AB (ref 65–99)
POTASSIUM: 4.2 mmol/L (ref 3.5–5.1)
Sodium: 139 mmol/L (ref 135–145)

## 2017-09-29 LAB — CBC WITH DIFFERENTIAL/PLATELET
BASOS ABS: 0 10*3/uL (ref 0.0–0.1)
Basophils Relative: 1 %
EOS PCT: 3 %
Eosinophils Absolute: 0.1 10*3/uL (ref 0.0–0.7)
HEMATOCRIT: 45.1 % (ref 36.0–46.0)
HEMOGLOBIN: 15 g/dL (ref 12.0–15.0)
LYMPHS ABS: 2 10*3/uL (ref 0.7–4.0)
LYMPHS PCT: 44 %
MCH: 27.7 pg (ref 26.0–34.0)
MCHC: 33.3 g/dL (ref 30.0–36.0)
MCV: 83.2 fL (ref 78.0–100.0)
MONOS PCT: 8 %
Monocytes Absolute: 0.4 10*3/uL (ref 0.1–1.0)
NEUTROS PCT: 44 %
Neutro Abs: 2.1 10*3/uL (ref 1.7–7.7)
Platelets: 215 10*3/uL (ref 150–400)
RBC: 5.42 MIL/uL — AB (ref 3.87–5.11)
RDW: 17.3 % — AB (ref 11.5–15.5)
WBC: 4.6 10*3/uL (ref 4.0–10.5)

## 2017-09-29 LAB — GLUCOSE, CAPILLARY
GLUCOSE-CAPILLARY: 204 mg/dL — AB (ref 65–99)
GLUCOSE-CAPILLARY: 73 mg/dL (ref 65–99)
GLUCOSE-CAPILLARY: 123 mg/dL — AB (ref 65–99)
Glucose-Capillary: 138 mg/dL — ABNORMAL HIGH (ref 65–99)

## 2017-09-29 LAB — PROTIME-INR
INR: 1.54
Prothrombin Time: 18.3 seconds — ABNORMAL HIGH (ref 11.4–15.2)

## 2017-09-29 MED ORDER — GLIMEPIRIDE 4 MG PO TABS: 4.0000 mg | ORAL_TABLET | Freq: Every day | ORAL | 0 refills | Status: DC

## 2017-09-29 MED ORDER — WARFARIN SODIUM 7.5 MG PO TABS
7.5000 mg | ORAL_TABLET | Freq: Once | ORAL | Status: AC
  Administered 2017-09-29: 7.5 mg via ORAL
  Filled 2017-09-29: qty 1

## 2017-09-29 MED ORDER — POLYETHYLENE GLYCOL 3350 17 G PO PACK: 17.0000 g | PACK | Freq: Every day | ORAL | 0 refills | Status: AC | PRN

## 2017-09-29 NOTE — Progress Notes (Signed)
PROGRESS NOTE  Patricia Randall HWE:[id number] DOB: 06-Apr-1929 DOA: 09/26/2017 PCP: Leonard Downing, MD  HPI/Recap of past 24 hours: Patricia Randall is a 84 y.o. female with medical history significant for CHF(improved EF 60-65% in 2015), permanent A. fib on Coumadin, hyperlipidemia, hypertension, diabetes mellitus type 2 presents to the ER complaining of shortness of breath, orthopnea for the past few weeks. Patient denies any bilateral lower extremity swelling, but does complain of ulcer/wound noted on her right leg for the past 3 weeks.  Patient also reported right lower extremity erythema, warmth, swelling that has worsened for the past few days.  Unsure if she received any antibiotics from her PCP.  Patient reports compliance to all her medications but noted that her PCP recently stopped her lisinopril. In the ED, CXR showed cardiac enlargement with no pulmonary vascular congestion, BNP 585.  ED Trop x1-, EKG showed A. fib with heart rate controlled, with T wave inversion unchanged from previous. Creatinine elevated at 2.2, worsening from baseline. Cardiology consulted, pt admitted for further management.  Today still has complain of soreness in the leg.  Breathing is better.  No nausea no vomiting.  Assessment/Plan: Principal Problem:   Cardiomyopathy ([place]) Active Problems:   Permanent atrial fibrillation (HCC)   Chronic diastolic congestive heart failure (HCC)   Essential hypertension, benign   Chronic kidney disease, stage IV (severe) (HCC)   CHF (congestive heart failure) (HCC)  Acute on chronic systolic heart failure Presents with orthopnea, SOB, elevated BNP 585 ECHO on 09/27/17: EF of 30-35%, diffuse hypokinesis. Last echo in 2015 showed LVEF of 60-65% Chest x-ray showed cardiomegaly with no vascular pulmonary congestion Cardiology consulted: IV Lasix 40 mg twice daily, switching to oral.  With increasing creatinine holding diuretics Continue coreg Daily weights, strict I's and  O's Telemetry, monitor closely  RLE cellulitis with R leg ulcer/wound Afebrile, no leukocytosis BC X 2 no growth Right lower extremity Doppler showed no DVT or SVT Right foot wound, no drainage noted XRAY RLE & rt foot no acute abnormalities Continue PO doxycycline, total 7-day treatment course. Wound care consulted The area has induration but does not have any evidence of abscess.  AKI on CKD stage 3 Improving Baseline Cr 1.5-1.7, currently 2.2 on admission ? Renal congestion from CHF Initial improvement, now worsening likely secondary to overdiuresis.  Holding diuretics for today. Avoid nephrotoxic's, renally dose all meds Daily BMP  Permanent A. fib Rate controlled Continue coreg Continue Coumadin, dosing per pharmacy, INR is subtherapeutic discussed with cardiology currently holding off on bridging.  Type 2 diabetes mellitus A1c 7.6 SSI, Accu-Cheks Held home glimepiride, may need to lower dose at home.  Patient not comfortable using injections at home.  Hypertension Started amlodipine, held lisinopril due to AK I IV hydralazine as needed  Hyperlipidemia LDL 69 at goal Continue statins      Code Status: Full  Family Communication: None at bedside  Disposition Plan: Home once stable   Consultants:  Cardiology  Procedures:  None  Antimicrobials:  Doxycycline  DVT prophylaxis: Coumadin   Objective: Vitals:   09/29/17 0021 09/29/17 0538 09/29/17 0740 09/29/17 1127  BP: 96/68 117/83 118/76 99/80  Pulse: 65 76 (!) 58 67  Resp: 18 18    Temp: 97.8 F (36.6 C) 97.7 F (36.5 C) (!) 97.4 F (36.3 C) 97.9 F (36.6 C)  TempSrc: Oral Oral Oral Oral  SpO2: 100% 96% 93% 95%  Weight:  62.3 kg (137 lb 6.4 oz)    Height:  Intake/Output Summary (Last 24 hours) at 09/29/2017 1703 Last data filed at 09/29/2017 0854 Gross per 24 hour  Intake 240 ml  Output -  Net 240 ml   Filed Weights   09/27/17 0424 09/28/17 0600 09/29/17 0538   Weight: 66.4 kg (146 lb 4.8 oz) 65.5 kg (144 lb 4.8 oz) 62.3 kg (137 lb 6.4 oz)    Exam:   General: NAD  Cardiovascular: S1, S2 present irregular  Respiratory: Bibasilar crackles  Abdomen: Soft, nontender, nondistended, bowel sounds present  Musculoskeletal: Right lower extremity with edema, erythema, tenderness  Skin: Normal except for above  Psychiatry: Normal mood   Data Reviewed: CBC: Recent Labs  Lab 09/26/17 1143 09/27/17 0600 09/28/17 0455 09/29/17 0503  WBC 4.1 3.6* 3.9* 4.6  NEUTROABS  --   --  1.8 2.1  HGB 12.8 14.1 15.5* 15.0  HCT 39.3 42.8 46.2* 45.1  MCV 82.6 81.8 83.4 83.2  PLT 215 200 205 829   Basic Metabolic Panel: Recent Labs  Lab 09/26/17 1143 09/27/17 0600 09/28/17 0455 09/29/17 0503  NA 138 139 138 139  K 4.6 3.9 4.0 4.2  CL 103 103 101 102  CO2 22 23 21* 23  GLUCOSE 163* 103* 138* 114*  BUN 44* 40* 46* 56*  CREATININE 2.22* 2.09* 2.08* 2.86*  CALCIUM 9.3 9.5 9.7 9.2   GFR: Estimated Creatinine Clearance: 11.6 mL/min (A) (by C-G formula based on SCr of 2.86 mg/dL (H)). Liver Function Tests: Recent Labs  Lab 09/26/17 1143  AST 23  ALT 21  ALKPHOS 74  BILITOT 0.5  PROT 6.4*  ALBUMIN 3.1*   No results for input(s): LIPASE, AMYLASE in the last 168 hours. No results for input(s): AMMONIA in the last 168 hours. Coagulation Profile: Recent Labs  Lab 09/26/17 1143 09/27/17 0600 09/28/17 0455 09/29/17 0503  INR 2.55 2.12 1.78 1.54   Cardiac Enzymes: No results for input(s): CKTOTAL, CKMB, CKMBINDEX, TROPONINI in the last 168 hours. BNP (last 3 results) No results for input(s): PROBNP in the last 8760 hours. HbA1C: Recent Labs    09/27/17 0600  HGBA1C 7.6*   CBG: Recent Labs  Lab 09/28/17 1145 09/28/17 1645 09/28/17 2056 09/29/17 0738 09/29/17 1125  GLUCAP 149* 136* 207* 138* 204*   Lipid Profile: Recent Labs    09/27/17 0600  CHOL 136  HDL 48  LDLCALC 69  TRIG 95  CHOLHDL 2.8   Thyroid Function  Tests: No results for input(s): TSH, T4TOTAL, FREET4, T3FREE, THYROIDAB in the last 72 hours. Anemia Panel: No results for input(s): VITAMINB12, FOLATE, FERRITIN, TIBC, IRON, RETICCTPCT in the last 72 hours. Urine analysis:    Component Value Date/Time   COLORURINE YELLOW 09/02/2017 1354   APPEARANCEUR CLEAR 09/02/2017 1354   LABSPEC 1.010 09/02/2017 1354   PHURINE 5.0 09/02/2017 1354   GLUCOSEU NEGATIVE 09/02/2017 1354   HGBUR NEGATIVE 09/02/2017 1354   [place] 09/02/2017 1354   KETONESUR NEGATIVE 09/02/2017 1354   PROTEINUR NEGATIVE 09/02/2017 1354   NITRITE NEGATIVE 09/02/2017 1354   LEUKOCYTESUR NEGATIVE 09/02/2017 1354   Sepsis Labs: @LABRCNTIP (procalcitonin:4,lacticidven:4)  ) Recent Results (from the past 240 hour(s))  Culture, blood (routine x 2)     Status: None (Preliminary result)   Collection Time: 09/26/17  9:25 PM  Result Value Ref Range Status   Specimen Description BLOOD RIGHT HAND  Final   Special Requests   Final    BOTTLES DRAWN AEROBIC ONLY Blood Culture adequate volume   Culture   Final    NO  GROWTH 3 DAYS Performed at [facility], [place] [address] [place], [place] [address]    Report Status PENDING  Incomplete  Culture, blood (routine x 2)     Status: None (Preliminary result)   Collection Time: 09/26/17  9:30 PM  Result Value Ref Range Status   Specimen Description BLOOD RIGHT HAND  Final   Special Requests   Final    BOTTLES DRAWN AEROBIC ONLY Blood Culture adequate volume   Culture   Final    NO GROWTH 3 DAYS Performed at [facility], [place] [address], [place], [place] [address]    Report Status PENDING  Incomplete      Studies: No results found.  Scheduled Meds: . carvedilol  9.375 mg Oral BID WC  . cholecalciferol  1,000 Units Oral Daily  . doxycycline  100 mg Oral Q12H  . feeding supplement (GLUCERNA SHAKE)  237 mL Oral Q24H  . insulin aspart  0-9 Units Subcutaneous TID WC  . latanoprost  1 drop Both Eyes  QHS  . pravastatin  40 mg Oral q1800  . sodium chloride flush  3 mL Intravenous Q12H  . Warfarin - Pharmacist Dosing Inpatient   Does not apply q1800    Continuous Infusions:   LOS: 0 days     Berle Mull, MD [facility]   If 7PM-7AM, please contact night-coverage [email or web address] Password [facility] 09/29/2017, 5:03 PM

## 2017-09-29 NOTE — Progress Notes (Signed)
[place] for warfarin Indication: atrial fibrillation  No Known Allergies  Patient Measurements: Height: 5' (152.4 cm) Weight: 137 lb 6.4 oz (62.3 kg) IBW/kg (Calculated) : 45.5 Heparin Dosing Weight: n/a   Vital Signs: Temp: 97.4 F (36.3 C) (04/11 0740) Temp Source: Oral (04/11 0740) BP: 118/76 (04/11 0740) Pulse Rate: 58 (04/11 0740)  Labs: Recent Labs    09/27/17 0600 09/28/17 0455 09/29/17 0503  HGB 14.1 15.5* 15.0  HCT 42.8 46.2* 45.1  PLT 200 205 215  LABPROT 23.6* 20.5* 18.3*  INR 2.12 1.78 1.54  CREATININE 2.09* 2.08* 2.86*   Estimated Creatinine Clearance: 11.6 mL/min (A) (by C-G formula based on SCr of 2.86 mg/dL (H)).  Medical History: Past Medical History:  Diagnosis Date  . Atrial fibrillation ([place])    a. 03/2013 s/p TEE/DCCV;  b. 03/2013 Eliquis initiated.  . Chronic systolic CHF (congestive heart failure) ([place])    a. 03/2013 Echo: EF 20-25%, diff HK, mild to mod MR, sev dil LA, mild RV dysfxn, sev dil RA, mod TR.  . Claudication ([place])   . Glaucoma   . High cholesterol   . Hypertension   . Type II diabetes mellitus (HCC)     Medications:  Medications Prior to Admission  Medication Sig Dispense Refill Last Dose  . Biotin 10 MG CAPS Take 10 mg by mouth daily.   Past Week at Unknown time  . carvedilol (COREG) 6.25 MG tablet TAKE 1 AND 1/2 TABLETS BY MOUTH TWICE A DAY WITH A MEAL 90 tablet 2 09/26/2017 at 0800  . cholecalciferol (VITAMIN D) 1000 units tablet Take 1,000 Units by mouth daily.   Past Week at Unknown time  . feeding supplement, GLUCERNA SHAKE, (GLUCERNA SHAKE) LIQD Take 237 mLs by mouth daily.   09/26/2017 at Unknown time  . furosemide (LASIX) 20 MG tablet Take 1 tablet (20 mg total) by mouth daily. 30 tablet 0 09/26/2017 at Unknown time  . latanoprost (XALATAN) 0.005 % ophthalmic solution Place 1 drop into both eyes at bedtime.   09/25/2017 at Unknown time  . lovastatin (MEVACOR) 40 MG tablet Take 40 mg  by mouth at bedtime.   09/25/2017 at Unknown time  . warfarin (COUMADIN) 2 MG tablet Take 1 tablet (2 mg total) by mouth daily. (Patient taking differently: Take 1-2 mg by mouth daily. Take 2 mg for two days then 1 mg for a day then 2 mg for the next two days then 1 mg.) 4 tablet 0 09/26/2017 at Unknown time  . [DISCONTINUED] glimepiride (AMARYL) 4 MG tablet Take 6-8 mg by mouth daily before breakfast.    09/26/2017 at Unknown time  . lisinopril (PRINIVIL,ZESTRIL) 40 MG tablet Take 40 mg by mouth daily.  0     Assessment: 88 YOF on Coumadin PTA for Afib. Originally thought that patient was alternating between 2mg , 2mg , and 1mg , but after clarification with the pharmacy, she was taking 2mg  tablets: 2 tablets, 2 tablets, then 1 tablet.    INR continues to be subtherapeutic: 2.12>1.78>1.54. CBC stable WNL, no overt bleeding documented. Given age and other risk factors, Cardiology and primary team agree NOT to bridge at this time. Will give one time boosted dose and likely return to home dose given therapeutic INR at the time of admission.   Goal of Therapy:  INR 2-3 Monitor platelets by anticoagulation protocol: Yes   Plan:  Coumadin 7.5mg  PO x 1 Monitor daily INR, CBC, s/s of bleed  Georga Bora, PharmD  Clinical Pharmacist 09/29/2017 10:23 AM

## 2017-09-29 NOTE — Progress Notes (Signed)
Progress Note  Patient Name: Patricia Randall Date of Encounter: 09/29/2017  Primary Cardiologist: No primary care provider on file.   Subjective   Still feeling better.  Having soreness still in her right calf, erythema.  Currently on doxycycline.  Inpatient Medications    Scheduled Meds: . carvedilol  9.375 mg Oral BID WC  . cholecalciferol  1,000 Units Oral Daily  . doxycycline  100 mg Oral Q12H  . feeding supplement (GLUCERNA SHAKE)  237 mL Oral Q24H  . insulin aspart  0-9 Units Subcutaneous TID WC  . latanoprost  1 drop Both Eyes QHS  . pravastatin  40 mg Oral q1800  . sodium chloride flush  3 mL Intravenous Q12H  . Warfarin - Pharmacist Dosing Inpatient   Does not apply q1800   Continuous Infusions:  PRN Meds: acetaminophen **OR** acetaminophen, albuterol, bisacodyl, hydrALAZINE, ondansetron **OR** ondansetron (ZOFRAN) IV, polyethylene glycol, traMADol, traZODone   Vital Signs    Vitals:   09/28/17 2015 09/29/17 0021 09/29/17 0538 09/29/17 0740  BP: 106/86 96/68 117/83 118/76  Pulse: 64 65 76 (!) 58  Resp: 18 18 18    Temp: 97.7 F (36.5 C) 97.8 F (36.6 C) 97.7 F (36.5 C) (!) 97.4 F (36.3 C)  TempSrc: Oral Oral Oral Oral  SpO2: 95% 100% 96% 93%  Weight:   137 lb 6.4 oz (62.3 kg)   Height:        Intake/Output Summary (Last 24 hours) at 09/29/2017 0953 Last data filed at 09/29/2017 0854 Gross per 24 hour  Intake 243 ml  Output -  Net 243 ml   Filed Weights   09/27/17 0424 09/28/17 0600 09/29/17 0538  Weight: 146 lb 4.8 oz (66.4 kg) 144 lb 4.8 oz (65.5 kg) 137 lb 6.4 oz (62.3 kg)    Telemetry    Atrial fibrillation heart rate in the 80s-90s- Personally Reviewed  ECG    Atrial fibrillation heart rate 85 on last EKG- Personally Reviewed  Physical Exam   GEN: No acute distress.   Neck: No JVD Cardiac:  Irregularly irregular, no murmurs, rubs, or gallops.  Respiratory: Clear to auscultation bilaterally. GI: Soft, nontender, non-distended  MS: No  edema; No deformity.  Mild redness, palpable lesion underneath erythema right lower extremity, shallow ulcer dressed Neuro:  Nonfocal  Psych: Normal affect   Labs    Chemistry Recent Labs  Lab 09/26/17 1143 09/27/17 0600 09/28/17 0455 09/29/17 0503  NA 138 139 138 139  K 4.6 3.9 4.0 4.2  CL 103 103 101 102  CO2 22 23 21* 23  GLUCOSE 163* 103* 138* 114*  BUN 44* 40* 46* 56*  CREATININE 2.22* 2.09* 2.08* 2.86*  CALCIUM 9.3 9.5 9.7 9.2  PROT 6.4*  --   --   --   ALBUMIN 3.1*  --   --   --   AST 23  --   --   --   ALT 21  --   --   --   ALKPHOS 74  --   --   --   BILITOT 0.5  --   --   --   GFRNONAA 19* 20* 20* 14*  GFRAA 22* 24* 24* 16*  ANIONGAP 13 13 16* 14     Hematology Recent Labs  Lab 09/27/17 0600 09/28/17 0455 09/29/17 0503  WBC 3.6* 3.9* PENDING  RBC 5.23* 5.54* 5.42*  HGB 14.1 15.5* 15.0  HCT 42.8 46.2* 45.1  MCV 81.8 83.4 83.2  MCH 27.0 28.0 27.7  MCHC 32.9 33.5 33.3  RDW 17.0* 17.3* 17.3*  PLT 200 205 PENDING    Cardiac EnzymesNo results for input(s): TROPONINI in the last 168 hours.  Recent Labs  Lab 09/26/17 1154  TROPIPOC 0.00     BNP Recent Labs  Lab 09/26/17 1143  BNP 585.3*     DDimer No results for input(s): DDIMER in the last 168 hours.   Radiology    No results found.  Cardiac Studies   EF 30%  Patient Profile     84 y.o. female with acute systolic heart failure, diabetes with hypertension hyperlipidemia, permanent atrial relation, chronic kidney disease stage III with superimposed acute kidney injury in the setting of diuresis with lower extremity ulcer and cellulitis  Assessment & Plan    Acute on chronic systolic heart failure -EF 30%.  This is a reduction from previous normal EF.  Originally however EF was 20% several years ago.  Cardiomegaly noted on x-ray. - No further edema noted in lower extremities.  Renal function, creatinine has increased suggesting that we have reached a point of potential over diuresis.  I  agree with holding furosemide.  Baseline creatinine had been stable during this admission at approximately 2.1.  Acute kidney injury -Continue to hold ACE inhibitor.  We will continue to hold hydralazine as well as isosorbide as well given her current blood pressure.  Permanent atrial fibrillation -Good rate control.  Failed cardioversion in the past.  Continue with Coumadin for anticoagulation.  Cellulitis -On p.o. doxycycline.  She does have a palpable lesion below her erythema, could this be abscess?  Per primary team.    For questions or updates, please contact [place] Please consult [email or web address] for contact info under Cardiology/STEMI.      Signed, Candee Furbish, MD  09/29/2017, 9:53 AM

## 2017-09-30 DIAGNOSIS — I5021 Acute systolic (congestive) heart failure: Secondary | ICD-10-CM | POA: Diagnosis not present

## 2017-09-30 DIAGNOSIS — N184 Chronic kidney disease, stage 4 (severe): Secondary | ICD-10-CM | POA: Diagnosis not present

## 2017-09-30 DIAGNOSIS — I420 Dilated cardiomyopathy: Secondary | ICD-10-CM | POA: Diagnosis not present

## 2017-09-30 DIAGNOSIS — I482 Chronic atrial fibrillation: Secondary | ICD-10-CM | POA: Diagnosis not present

## 2017-09-30 LAB — BASIC METABOLIC PANEL
Anion gap: 11 (ref 5–15)
BUN: 62 mg/dL — ABNORMAL HIGH (ref 6–20)
CALCIUM: 8.9 mg/dL (ref 8.9–10.3)
CHLORIDE: 100 mmol/L — AB (ref 101–111)
CO2: 24 mmol/L (ref 22–32)
CREATININE: 2.58 mg/dL — AB (ref 0.44–1.00)
GFR, EST AFRICAN AMERICAN: 18 mL/min — AB (ref >60)
GFR, EST NON AFRICAN AMERICAN: 16 mL/min — AB (ref >60)
Glucose, Bld: 121 mg/dL — ABNORMAL HIGH (ref 65–99)
Potassium: 4.3 mmol/L (ref 3.5–5.1)
SODIUM: 135 mmol/L (ref 135–145)

## 2017-09-30 LAB — CBC WITH DIFFERENTIAL/PLATELET
BASOS PCT: 1 %
Basophils Absolute: 0 10*3/uL (ref 0.0–0.1)
EOS ABS: 0.1 10*3/uL (ref 0.0–0.7)
EOS PCT: 3 %
HCT: 44.7 % (ref 36.0–46.0)
Hemoglobin: 14.7 g/dL (ref 12.0–15.0)
Lymphocytes Relative: 46 %
Lymphs Abs: 2.2 10*3/uL (ref 0.7–4.0)
MCH: 27.3 pg (ref 26.0–34.0)
MCHC: 32.9 g/dL (ref 30.0–36.0)
MCV: 83.1 fL (ref 78.0–100.0)
MONOS PCT: 6 %
Monocytes Absolute: 0.3 10*3/uL (ref 0.1–1.0)
NEUTROS PCT: 44 %
Neutro Abs: 2.1 10*3/uL (ref 1.7–7.7)
PLATELETS: 217 10*3/uL (ref 150–400)
RBC: 5.38 MIL/uL — ABNORMAL HIGH (ref 3.87–5.11)
RDW: 17.1 % — AB (ref 11.5–15.5)
WBC: 4.8 10*3/uL (ref 4.0–10.5)

## 2017-09-30 LAB — PROTIME-INR
INR: 1.54
PROTHROMBIN TIME: 18.4 s — AB (ref 11.4–15.2)

## 2017-09-30 LAB — GLUCOSE, CAPILLARY
GLUCOSE-CAPILLARY: 129 mg/dL — AB (ref 65–99)
Glucose-Capillary: 200 mg/dL — ABNORMAL HIGH (ref 65–99)

## 2017-09-30 MED ORDER — WARFARIN SODIUM 2 MG PO TABS: 4.0000 mg | ORAL_TABLET | Freq: Once | ORAL | Status: DC

## 2017-09-30 MED ORDER — POLYETHYLENE GLYCOL 3350 17 G PO PACK
17.0000 g | PACK | Freq: Every day | ORAL | Status: DC
  Administered 2017-09-30: 17 g via ORAL
  Filled 2017-09-30: qty 1

## 2017-09-30 MED ORDER — SILVER SULFADIAZINE 1 % EX CREA: TOPICAL_CREAM | CUTANEOUS | 1 refills | Status: DC

## 2017-09-30 MED ORDER — DOXYCYCLINE HYCLATE 100 MG PO TABS: 100.0000 mg | ORAL_TABLET | Freq: Two times a day (BID) | ORAL | 0 refills | Status: AC

## 2017-09-30 MED ORDER — FUROSEMIDE 20 MG PO TABS: 20.0000 mg | ORAL_TABLET | Freq: Every day | ORAL | 0 refills | Status: DC

## 2017-09-30 NOTE — Progress Notes (Signed)
Progress Note  Patient Name: Patricia Randall Date of Encounter: 09/30/2017  Primary Cardiologist: No primary care provider on file.   Subjective   Improved. No CP, no SOB  Inpatient Medications    Scheduled Meds: . carvedilol  9.375 mg Oral BID WC  . cholecalciferol  1,000 Units Oral Daily  . doxycycline  100 mg Oral Q12H  . feeding supplement (GLUCERNA SHAKE)  237 mL Oral Q24H  . insulin aspart  0-9 Units Subcutaneous TID WC  . latanoprost  1 drop Both Eyes QHS  . pravastatin  40 mg Oral q1800  . sodium chloride flush  3 mL Intravenous Q12H  . Warfarin - Pharmacist Dosing Inpatient   Does not apply q1800   Continuous Infusions:  PRN Meds: acetaminophen **OR** acetaminophen, albuterol, bisacodyl, hydrALAZINE, ondansetron **OR** ondansetron (ZOFRAN) IV, polyethylene glycol, traMADol, traZODone   Vital Signs    Vitals:   09/29/17 1127 09/29/17 2027 09/30/17 0512 09/30/17 0518  BP: 99/80 137/90  123/86  Pulse: 67 65  (!) 52  Resp:  20  20  Temp: 97.9 F (36.6 C) 97.8 F (36.6 C)  (!) 97.4 F (36.3 C)  TempSrc: Oral Oral  Oral  SpO2: 95% 99%  100%  Weight:   136 lb (61.7 kg)   Height:        Intake/Output Summary (Last 24 hours) at 09/30/2017 0752 Last data filed at 09/29/2017 0854 Gross per 24 hour  Intake 240 ml  Output -  Net 240 ml   Filed Weights   09/28/17 0600 09/29/17 0538 09/30/17 0512  Weight: 144 lb 4.8 oz (65.5 kg) 137 lb 6.4 oz (62.3 kg) 136 lb (61.7 kg)    Telemetry    Atrial fibrillation heart rate in the 80s-90s, rare 120- Personally Reviewed  ECG    Atrial fibrillation heart rate 85 on last EKG- Personally Reviewed  Physical Exam  GEN: Well nourished, well developed, in no acute distress  HEENT: normal  Neck: no JVD, carotid bruits, or masses Cardiac: irreg; no murmurs, rubs, or gallops,no edema  Respiratory:  clear to auscultation bilaterally, normal work of breathing GI: soft, nontender, nondistended, + BS MS: no deformity or atrophy   Skin: warm and dry, no rash, mild erythema RLE calf Neuro:  Alert and Oriented x 3, Strength and sensation are intact Psych: euthymic mood, full affect   Labs    Chemistry Recent Labs  Lab 09/26/17 1143  09/28/17 0455 09/29/17 0503 09/30/17 0641  NA 138   < > 138 139 135  K 4.6   < > 4.0 4.2 4.3  CL 103   < > 101 102 100*  CO2 22   < > 21* 23 24  GLUCOSE 163*   < > 138* 114* 121*  BUN 44*   < > 46* 56* 62*  CREATININE 2.22*   < > 2.08* 2.86* 2.58*  CALCIUM 9.3   < > 9.7 9.2 8.9  PROT 6.4*  --   --   --   --   ALBUMIN 3.1*  --   --   --   --   AST 23  --   --   --   --   ALT 21  --   --   --   --   ALKPHOS 74  --   --   --   --   BILITOT 0.5  --   --   --   --   GFRNONAA 19*   < >  20* 14* 16*  GFRAA 22*   < > 24* 16* 18*  ANIONGAP 13   < > 16* 14 11   < > = values in this interval not displayed.     Hematology Recent Labs  Lab 09/28/17 0455 09/29/17 0503 09/30/17 0641  WBC 3.9* 4.6 4.8  RBC 5.54* 5.42* 5.38*  HGB 15.5* 15.0 14.7  HCT 46.2* 45.1 44.7  MCV 83.4 83.2 83.1  MCH 28.0 27.7 27.3  MCHC 33.5 33.3 32.9  RDW 17.3* 17.3* 17.1*  PLT 205 215 217    Cardiac EnzymesNo results for input(s): TROPONINI in the last 168 hours.  Recent Labs  Lab 09/26/17 1154  TROPIPOC 0.00     BNP Recent Labs  Lab 09/26/17 1143  BNP 585.3*     DDimer No results for input(s): DDIMER in the last 168 hours.   Radiology    No results found.  Cardiac Studies   EF 30%  Patient Profile     84 y.o. female with acute systolic heart failure, diabetes with hypertension hyperlipidemia, permanent atrial relation, chronic kidney disease stage III with superimposed acute kidney injury in the setting of diuresis with lower extremity ulcer and cellulitis  Assessment & Plan    Acute on chronic systolic heart failure -EF 30%.  This is a reduction from previous normal EF.  Originally however EF was 20% several years ago.  Cardiomegaly noted on x-ray. - No further edema  noted in lower extremities.  Renal function, creatinine has decreased slightly today. Should be ok to be DC'd. Would resume lasix 20mg  PO QD tomorrow at home.   Acute kidney injury -Continue to hold ACE inhibitor.  We will continue to hold hydralazine as well as isosorbide as well given her current blood pressure. Would not restart as outpatient at this point.  Improved creatinine this AM.  Permanent atrial fibrillation -Good rate control.  Failed cardioversion in the past.  Continue with Coumadin for anticoagulation.  Cellulitis -On p.o. doxycycline.  Improving.   [place] for DC from cardiology perspective. Will have clinic set up follow up appt.   For questions or updates, please contact [place] Please consult [email or web address] for contact info under Cardiology/STEMI.      Signed, Candee Furbish, MD  09/30/2017, 7:52 AM

## 2017-09-30 NOTE — Progress Notes (Signed)
[place] for warfarin Indication: atrial fibrillation  No Known Allergies  Patient Measurements: Height: 5' (152.4 cm) Weight: 136 lb (61.7 kg) IBW/kg (Calculated) : 45.5 Heparin Dosing Weight: n/a   Vital Signs: Temp: 97.5 F (36.4 C) (04/12 0759) Temp Source: Oral (04/12 0759) BP: 112/73 (04/12 0759) Pulse Rate: 67 (04/12 0759)  Labs: Recent Labs    09/28/17 0455 09/29/17 0503 09/30/17 0641  HGB 15.5* 15.0 14.7  HCT 46.2* 45.1 44.7  PLT 205 215 217  LABPROT 20.5* 18.3* 18.4*  INR 1.78 1.54 1.54  CREATININE 2.08* 2.86* 2.58*   Estimated Creatinine Clearance: 12.8 mL/min (A) (by C-G formula based on SCr of 2.58 mg/dL (H)).  Medical History: Past Medical History:  Diagnosis Date  . Atrial fibrillation ([place])    a. 03/2013 s/p TEE/DCCV;  b. 03/2013 Eliquis initiated.  . Chronic systolic CHF (congestive heart failure) ([place])    a. 03/2013 Echo: EF 20-25%, diff HK, mild to mod MR, sev dil LA, mild RV dysfxn, sev dil RA, mod TR.  . Claudication ([place])   . Glaucoma   . High cholesterol   . Hypertension   . Type II diabetes mellitus (HCC)     Medications:  Medications Prior to Admission  Medication Sig Dispense Refill Last Dose  . Biotin 10 MG CAPS Take 10 mg by mouth daily.   Past Week at Unknown time  . carvedilol (COREG) 6.25 MG tablet TAKE 1 AND 1/2 TABLETS BY MOUTH TWICE A DAY WITH A MEAL 90 tablet 2 09/26/2017 at 0800  . cholecalciferol (VITAMIN D) 1000 units tablet Take 1,000 Units by mouth daily.   Past Week at Unknown time  . feeding supplement, GLUCERNA SHAKE, (GLUCERNA SHAKE) LIQD Take 237 mLs by mouth daily.   09/26/2017 at Unknown time  . furosemide (LASIX) 20 MG tablet Take 1 tablet (20 mg total) by mouth daily. 30 tablet 0 09/26/2017 at Unknown time  . latanoprost (XALATAN) 0.005 % ophthalmic solution Place 1 drop into both eyes at bedtime.   09/25/2017 at Unknown time  . lovastatin (MEVACOR) 40 MG tablet Take 40 mg by  mouth at bedtime.   09/25/2017 at Unknown time  . warfarin (COUMADIN) 2 MG tablet Take 1 tablet (2 mg total) by mouth daily. (Patient taking differently: Take 1-2 mg by mouth daily. Take 2 tablets (4mg ) for two days then 1 tablet (2mg ) for a day then 4 mg for the next two days then 2 mg.) 4 tablet 0 09/26/2017 at Unknown time  . [DISCONTINUED] glimepiride (AMARYL) 4 MG tablet Take 6-8 mg by mouth daily before breakfast.    09/26/2017 at Unknown time  . lisinopril (PRINIVIL,ZESTRIL) 40 MG tablet Take 40 mg by mouth daily.  0     Assessment: 88 YOF on Coumadin PTA for Afib. Originally thought that patient was alternating between daily doses of 2mg , 2mg , and 1mg , but after clarification with the pharmacy, she was taking 2mg  tablets: 2 tablets, 2 tablets, then 1 tablet.    INR continues to be subtherapeutic: 1.54 after one boosted dose. CBC stable WNL, no overt bleeding documented. Given age and other risk factors, Cardiology and primary team agree NOT to bridge at this time. Will return to home dose given therapeutic INR at the time of admission.   Goal of Therapy:  INR 2-3 Monitor platelets by anticoagulation protocol: Yes   Plan:  Coumadin 4mg  PO x 1 Monitor daily INR, CBC, s/s of bleed  Georga Bora, PharmD  Clinical Pharmacist 09/30/2017 10:52 AM

## 2017-09-30 NOTE — Evaluation (Signed)
Physical Therapy Evaluation Patient Details Name: Patricia Randall MRN: [id number] DOB: 1929/04/21 Today's Date: 09/30/2017   History of Present Illness  84yo female with c/o SOB and orthopnea for the past few weeks, also ulcer on her R foot. She was diagnosed with cardiomyopathy and R LE cellulitis secondary to the ulcer. Negative for DVT. PMH A-fib, CHF, HTN, DM, hx cardioversion  Clinical Impression   Patient received in bed, very pleasant and willing to work with skilled PT services today. She is easily able to complete functional bed mobility, functional transfers, and gait in hallway with no device and S-Mod(I) this morning, no significant balance or safety deficits noted. She reports she is generally at her functional baseline. Patricia Randall does not appear to be in need of skilled PT services in the acute setting or moving forward as she is generally at her baseline level of function. PT signing off, however please reorder if any new concerns related to mobility arise. Thank you for the referral.     Follow Up Recommendations No PT follow up    Equipment Recommendations  None recommended by PT    Recommendations for Other Services       Precautions / Restrictions Precautions Precautions: None Restrictions Weight Bearing Restrictions: No      Mobility  Bed Mobility Overal bed mobility: Modified Independent                Transfers Overall transfer level: Needs assistance Equipment used: None Transfers: Sit to/from Stand Sit to Stand: Supervision         General transfer comment: S for general safety, no deficits or concerns noted   Ambulation/Gait Ambulation/Gait assistance: Supervision Ambulation Distance (Feet): 200 Feet Assistive device: None Gait Pattern/deviations: Step-through pattern;Decreased step length - right;Decreased step length - left;Decreased stride length;Narrow base of support     General Gait Details: gait pattern generally WNL for age, no  significant deviations or balance concerns noted; steady and safe at self-selected pace   Stairs            Wheelchair Mobility    Modified Rankin (Stroke Patients Only)       Balance Overall balance assessment: No apparent balance deficits (not formally assessed);Modified Independent                                           Pertinent Vitals/Pain Pain Assessment: No/denies pain    Home Living Family/patient expects to be discharged to:: Private residence Living Arrangements: Children Available Help at Discharge: Family;Available PRN/intermittently Type of Home: House Home Access: Stairs to enter Entrance Stairs-Rails: Can reach both Entrance Stairs-Number of Steps: 3 Home Layout: One level Home Equipment: None      Prior Function Level of Independence: Independent               Hand Dominance        Extremity/Trunk Assessment   Upper Extremity Assessment Upper Extremity Assessment: Overall WFL for tasks assessed    Lower Extremity Assessment Lower Extremity Assessment: Overall WFL for tasks assessed    Cervical / Trunk Assessment Cervical / Trunk Assessment: Normal  Communication   Communication: No difficulties  Cognition Arousal/Alertness: Awake/alert Behavior During Therapy: WFL for tasks assessed/performed Overall Cognitive Status: Within Functional Limits for tasks assessed  General Comments      Exercises     Assessment/Plan    PT Assessment Patent does not need any further PT services  PT Problem List Decreased activity tolerance       PT Treatment Interventions Patient/family education    PT Goals (Current goals can be found in the Care Plan section)  Acute Rehab PT Goals Patient Stated Goal: to go home  PT Goal Formulation: With patient Time For Goal Achievement: 10/14/17 Potential to Achieve Goals: Good    Frequency Other (Comment)(no skilled  PT services needed )   Barriers to discharge        Co-evaluation               AM-PAC PT "6 Clicks" Daily Activity  Outcome Measure Difficulty turning over in bed (including adjusting bedclothes, sheets and blankets)?: None Difficulty moving from lying on back to sitting on the side of the bed? : None Difficulty sitting down on and standing up from a chair with arms (e.g., wheelchair, bedside commode, etc,.)?: None Help needed moving to and from a bed to chair (including a wheelchair)?: None Help needed walking in hospital room?: None Help needed climbing 3-5 steps with a railing? : A Little 6 Click Score: 23    End of Session Equipment Utilized During Treatment: Gait belt Activity Tolerance: Patient tolerated treatment well Patient left: in chair;with call bell/phone within reach   PT Visit Diagnosis: Muscle weakness (generalized) (M62.81)    Time: 1540-0867 PT Time Calculation (min) (ACUTE ONLY): 15 min   Charges:   PT Evaluation $PT Eval Low Complexity: 1 Low     PT G Codes:        Patricia Randall PT, DPT, CBIS  Supplemental Physical Therapist Patricia Randall   Pager [phone]

## 2017-10-01 LAB — CULTURE, BLOOD (ROUTINE X 2)
Culture: NO GROWTH
Culture: NO GROWTH
Special Requests: ADEQUATE
Special Requests: ADEQUATE

## 2017-10-04 NOTE — Discharge Summary (Signed)
[facility] Discharge Summary   Patient: Patricia Randall PRF:[id number]   PCP: Leonard Downing, MD DOB: 09-25-1928   Date of admission: 09/26/2017   Date of discharge: 09/30/2017   Discharge Diagnoses:  Principal Problem:   Cardiomyopathy [facility]) Active Problems:   Permanent atrial fibrillation ([employer])   Chronic diastolic congestive heart failure ([place])   Essential hypertension, benign   Chronic kidney disease, stage IV (severe) ([facility])   CHF (congestive heart failure) ([place])  Admitted From: home Disposition:  Home with home health  Recommendations for Outpatient Follow-up:  1. Please follow-up with PCP 1 week with repeat BMP.  Follow-up Information    Leonard Downing, MD. Go on 10/06/2017.   Specialty:  Family Medicine Why:  with BMP @10 :00 am Contact information: [place] [address] [phone]        [place]. Schedule an appointment as soon as possible for a visit in 2 weeks.   Specialty:  Cardiology Why:  office will call you.  Contact information: [address] [place] [phone]         Diet recommendation: cardiac diet  Activity: The patient is advised to gradually reintroduce usual activities.  Discharge Condition: good  Code Status: full code  History of present illness: As per the H and P dictated on admission, "Patricia Randall is a 84 y.o. female with medical history significant for CHF(improved EF 60-65% in 2015), permanent A. fib on Coumadin, hyperlipidemia, hypertension, diabetes mellitus type 2 presents to the ER complaining of shortness of breath, orthopnea for the past few weeks.  Patient reports being unable to lay flat, and has to prop herself up while in bed.  Patient denies any bilateral lower extremity swelling, but does complain of ulcer/wound noted on her right foot for the past 3 weeks.  Patient also reported right lower extremity erythema, warmth,  swelling that has worsened for the past few days.  Unsure if she received any antibiotics from her PCP.  Patient reports compliance to all her medications but noted that her PCP recently stopped her lisinopril.  Patient denies any chest pain/pressure, palpitations, dizziness, abdominal pain, cough, fever/chills.  No obvious drainage noted from the right foot ulcer.  Patient admitted for further management."  Hospital Course:  Summary of her active problems in the hospital is as following. Acute on chronic systolic heart failure Presents with orthopnea, SOB, elevated BNP 585 ECHO on 09/27/17: EF of 30-35%, diffuse hypokinesis. Last echo in 2015 showed LVEF of 60-65% Chest x-ray showed cardiomegaly with no vascular pulmonary congestion Cardiology consulted: IV Lasix 40 mg twice daily, switching to oral. Continue coreg Follow up with cardiology as an outpatient  RLEcellulitis withR leg ulcer/wound Afebrile, no leukocytosis BC X 2 no growth Right lower extremity Doppler showed no DVT or SVT Right footwound,no drainage noted XRAY RLE & rt foot no acute abnormalities Continue PO doxycycline, total 7-day treatment course. Wound care consulted The area has induration but does not have any evidence of abscess.  AKI on CKD stage 3 Improving Baseline Cr 1.5-1.7, 2.2 on admission ? Renal congestion from CHF Initial improvement, followed by worsening likely secondary to overdiuresis.  With improvement in holding diuresis. Avoid nephrotoxic's, renally dose all meds  PermanentA. fib Rate controlled Continue coreg Continue Coumadin, dosing per pharmacy, INR is subtherapeutic discussed with cardiology currently holding off on bridging.  Type 2 diabetes mellitus A1c 7.6 Held home glimepiride, may need to lower dose at home.  Patient not comfortable using injections at home.  Hypertension Started amlodipine, held lisinopril due to AK I IV hydralazine as needed  Hyperlipidemia LDL 69  at goal Continue statins  All other chronic medical condition were stable during the hospitalization.  Patient was seen by physical therapy, who recommended home health, which was arranged by Education officer, museum and case Freight forwarder. On the day of the discharge the patient's vitals were stable, and no other acute medical condition were reported by patient. the patient was felt safe to be discharge at home with home health.  Procedures and Results:  none   Consultations:  General cardiology   DISCHARGE MEDICATION: Allergies as of 09/30/2017   No Known Allergies     Medication List    STOP taking these medications   lisinopril 40 MG tablet Commonly known as:  PRINIVIL,ZESTRIL     TAKE these medications   Biotin 10 MG Caps Take 10 mg by mouth daily.   carvedilol 6.25 MG tablet Commonly known as:  COREG TAKE 1 AND 1/2 TABLETS BY MOUTH TWICE A DAY WITH A MEAL   cholecalciferol 1000 units tablet Commonly known as:  VITAMIN D Take 1,000 Units by mouth daily.   feeding supplement (GLUCERNA SHAKE) Liqd Take 237 mLs by mouth daily.   furosemide 20 MG tablet Commonly known as:  LASIX Take 1 tablet (20 mg total) by mouth daily.   glimepiride 4 MG tablet Commonly known as:  AMARYL Take 1 tablet (4 mg total) by mouth daily before breakfast. What changed:  how much to take   latanoprost 0.005 % ophthalmic solution Commonly known as:  XALATAN Place 1 drop into both eyes at bedtime.   lovastatin 40 MG tablet Commonly known as:  MEVACOR Take 40 mg by mouth at bedtime.   polyethylene glycol packet Commonly known as:  MIRALAX / GLYCOLAX Take 17 g by mouth daily as needed for mild constipation.   silver sulfADIAZINE 1 % cream Commonly known as:  SILVADENE Apply to affected area daily   warfarin 2 MG tablet Commonly known as:  COUMADIN Take 1 tablet (2 mg total) by mouth daily. What changed:    how much to take  additional instructions     ASK your doctor about these  medications   doxycycline 100 MG tablet Commonly known as:  VIBRA-TABS Take 1 tablet (100 mg total) by mouth every 12 (twelve) hours for 2 days. Ask about: Should I take this medication?      No Known Allergies Discharge Instructions    Diet - low sodium heart healthy   Complete by:  As directed    Discharge instructions   Complete by:  As directed    It is important that you read following instructions as well as go over your medication list with RN to help you understand your care after this hospitalization.  Discharge Instructions: Please follow-up with PCP in one week  Please request your primary care physician to go over all Hospital Tests and Procedure/Radiological results at the follow up,  Please get all Hospital records sent to your PCP by signing hospital release before you go home.   Do not take more than prescribed Pain, Sleep and Anxiety Medications. You were cared for by a hospitalist during your hospital stay. If you have any questions about your discharge medications or the care you received while you were in the hospital after you are discharged, you can call the unit and ask to speak with the hospitalist on call if  the hospitalist that took care of you is not available.  Once you are discharged, your primary care physician will handle any further medical issues. Please note that NO REFILLS for any discharge medications will be authorized once you are discharged, as it is imperative that you return to your primary care physician (or establish a relationship with a primary care physician if you do not have one) for your aftercare needs so that they can reassess your need for medications and monitor your lab values. You Must read complete instructions/literature along with all the possible adverse reactions/side effects for all the Medicines you take and that have been prescribed to you. Take any new Medicines after you have completely understood and accept all the possible  adverse reactions/side effects. Wear Seat belts while driving. If you have smoked or chewed Tobacco in the last 2 yrs please stop smoking and/or stop any Recreational drug use.   Foam dressing   Complete by:  As directed    Silvadene to provide enzymatic debridement; Foam dressing to protect from further injury.   Increase activity slowly   Complete by:  As directed      Discharge Exam: Filed Weights   09/28/17 0600 09/29/17 0538 09/30/17 0512  Weight: 65.5 kg (144 lb 4.8 oz) 62.3 kg (137 lb 6.4 oz) 61.7 kg (136 lb)   Vitals:   09/30/17 1150 09/30/17 1554  BP: 100/79 94/79  Pulse: (!) 103 65  Resp: 19 20  Temp: 97.6 F (36.4 C) (!) 97.5 F (36.4 C)  SpO2: 97% 100%   General: Appear in no distress, no Rash; Oral Mucosa moist. Cardiovascular: S1 and S2 Present, no Murmur, no JVD Respiratory: Bilateral Air entry present and Clear to Auscultation, no Crackles, no wheezes Abdomen: Bowel Sound present, Soft and no tenderness Extremities: no Pedal edema, no calf tenderness Neurology: Grossly no focal neuro deficit.  The results of significant diagnostics from this hospitalization (including imaging, microbiology, ancillary and laboratory) are listed below for reference.    Significant Diagnostic Studies: Dg Chest 2 View  Result Date: 09/26/2017 CLINICAL DATA:  Shortness of breath when laying down EXAM: CHEST - 2 VIEW COMPARISON:  07/05/2017 FINDINGS: Chronic cardiopericardial enlargement. Interstitial coarsening seen previously is regressed/resolved. Interstitial crowding without focal opacity. No Kerley lines, effusion, or pneumothorax. Diffuse degenerative endplate spurring. IMPRESSION: Chronic cardiopericardial enlargement.  No convincing edema. Electronically Signed   By: Monte Fantasia M.D.   On: 09/26/2017 12:15   Dg Tibia/fibula Right  Result Date: 09/26/2017 CLINICAL DATA:  Cellulitis of the right leg EXAM: RIGHT TIBIA AND FIBULA - 2 VIEW COMPARISON:  None. FINDINGS: No  fracture or malalignment. No periostitis or bone destruction. Vascular calcifications. No soft tissue gas. IMPRESSION: No acute osseous abnormality Electronically Signed   By: Donavan Foil M.D.   On: 09/26/2017 19:40   Dg Foot Complete Right  Result Date: 09/26/2017 CLINICAL DATA:  Cellulitis EXAM: RIGHT FOOT COMPLETE - 3+ VIEW COMPARISON:  None. FINDINGS: No fracture or malalignment. Degenerative changes at the first MTP joint. Vascular calcifications. Moderate plantar calcaneal spur. Soft tissue edema without soft tissue gas or radiopaque foreign body. IMPRESSION: No acute osseous abnormality Electronically Signed   By: Donavan Foil M.D.   On: 09/26/2017 19:42    Microbiology: Recent Results (from the past 240 hour(s))  Culture, blood (routine x 2)     Status: None   Collection Time: 09/26/17  9:25 PM  Result Value Ref Range Status   Specimen Description BLOOD RIGHT HAND  Final   Special Requests   Final    BOTTLES DRAWN AEROBIC ONLY Blood Culture adequate volume   Culture   Final    NO GROWTH 5 DAYS Performed at [facility], [address] [place], [place] [address]    Report Status 10/01/2017 FINAL  Final  Culture, blood (routine x 2)     Status: None   Collection Time: 09/26/17  9:30 PM  Result Value Ref Range Status   Specimen Description BLOOD RIGHT HAND  Final   Special Requests   Final    BOTTLES DRAWN AEROBIC ONLY Blood Culture adequate volume   Culture   Final    NO GROWTH 5 DAYS Performed at [facility], [place] [address], [place], [place] [address]    Report Status 10/01/2017 FINAL  Final     Labs: CBC: Recent Labs  Lab 09/28/17 0455 09/29/17 0503 09/30/17 0641  WBC 3.9* 4.6 4.8  NEUTROABS 1.8 2.1 2.1  HGB 15.5* 15.0 14.7  HCT 46.2* 45.1 44.7  MCV 83.4 83.2 83.1  PLT 205 215 948   Basic Metabolic Panel: Recent Labs  Lab 09/28/17 0455 09/29/17 0503 09/30/17 0641  NA 138 139 135  K 4.0 4.2 4.3  CL 101 102 100*  CO2 21* 23 24  GLUCOSE  138* 114* 121*  BUN 46* 56* 62*  CREATININE 2.08* 2.86* 2.58*  CALCIUM 9.7 9.2 8.9   BNP (last 3 results) Recent Labs    07/05/17 1100 09/26/17 1143  BNP 524.5* 585.3*   CBG: Recent Labs  Lab 09/29/17 1125 09/29/17 1708 09/29/17 2043 09/30/17 0750 09/30/17 1119  GLUCAP 204* 73 123* 129* 200*   Time spent: 35 minutes  Signed:  Berle Mull  [facility] 09/30/2017 , 5:21 PM

## 2017-10-24 ENCOUNTER — Encounter: Payer: Self-pay | Admitting: Cardiology

## 2017-10-25 ENCOUNTER — Ambulatory Visit: Payer: Medicare Other | Admitting: Cardiology

## 2017-10-25 ENCOUNTER — Encounter: Payer: Self-pay | Admitting: Cardiology

## 2017-10-25 ENCOUNTER — Ambulatory Visit (INDEPENDENT_AMBULATORY_CARE_PROVIDER_SITE_OTHER): Payer: Medicare Other | Admitting: Cardiology

## 2017-10-25 VITALS — BP 140/78 | HR 72 | Ht 60.0 in | Wt 151.0 lb

## 2017-10-25 DIAGNOSIS — I5022 Chronic systolic (congestive) heart failure: Secondary | ICD-10-CM

## 2017-10-25 NOTE — Patient Instructions (Signed)
Medication Instructions:   Your physician recommends that you continue on your current medications as directed. Please refer to the Current Medication list given to you today.   If you need a refill on your cardiac medications before your next appointment, please call your pharmacy.  Labwork:  NONE ORDERED  TODAY    Testing/Procedures:  NONE ORDERED  TODAY     Follow-Up:  IN 2 TO 3 MONTHS  WITH DR Marlou Porch    Any Other Special Instructions Will Be Listed Below (If Applicable).  1. MAKE SURE YOU WEIGH YOUR SELF DAILY  AND CONTACT OFFICE  IF YOU  GAIN 3 LBS IN ONE DAY AND      5 LBS IN A WEEK    2.  MAKE SURE YOU WATCH YOUR SODIUM  INTAKE :   Low-Sodium Eating Plan Sodium, which is an element that makes up salt, helps you maintain a healthy balance of fluids in your body. Too much sodium can increase your blood pressure and cause fluid and waste to be held in your body. Your health care provider or dietitian may recommend following this plan if you have high blood pressure (hypertension), kidney disease, liver disease, or heart failure. Eating less sodium can help lower your blood pressure, reduce swelling, and protect your heart, liver, and kidneys. What are tips for following this plan? General guidelines  Most people on this plan should limit their sodium intake to 1,500-2,000 mg (milligrams) of sodium each day. Reading food labels  The Nutrition Facts label lists the amount of sodium in one serving of the food. If you eat more than one serving, you must multiply the listed amount of sodium by the number of servings.  Choose foods with less than 140 mg of sodium per serving.  Avoid foods with 300 mg of sodium or more per serving. Shopping  Look for lower-sodium products, often labeled as "low-sodium" or "no salt added."  Always check the sodium content even if foods are labeled as "unsalted" or "no salt added".  Buy fresh foods. ? Avoid canned foods and premade or  frozen meals. ? Avoid canned, cured, or processed meats  Buy breads that have less than 80 mg of sodium per slice. Cooking  Eat more home-cooked food and less restaurant, buffet, and fast food.  Avoid adding salt when cooking. Use salt-free seasonings or herbs instead of table salt or sea salt. Check with your health care provider or pharmacist before using salt substitutes.  Cook with plant-based oils, such as canola, sunflower, or olive oil. Meal planning  When eating at a restaurant, ask that your food be prepared with less salt or no salt, if possible.  Avoid foods that contain MSG (monosodium glutamate). MSG is sometimes added to [place] food, bouillon, and some canned foods. What foods are recommended? The items listed may not be a complete list. Talk with your dietitian about what dietary choices are best for you. Grains Low-sodium cereals, including oats, puffed wheat and rice, and shredded wheat. Low-sodium crackers. Unsalted rice. Unsalted pasta. Low-sodium bread. Whole-grain breads and whole-grain pasta. Vegetables Fresh or frozen vegetables. "No salt added" canned vegetables. "No salt added" tomato sauce and paste. Low-sodium or reduced-sodium tomato and vegetable juice. Fruits Fresh, frozen, or canned fruit. Fruit juice. Meats and other protein foods Fresh or frozen (no salt added) meat, poultry, seafood, and fish. Low-sodium canned tuna and salmon. Unsalted nuts. Dried peas, beans, and lentils without added salt. Unsalted canned beans. Eggs. Unsalted nut butters. Dairy Milk.  Soy milk. Cheese that is naturally low in sodium, such as ricotta cheese, fresh mozzarella, or Swiss cheese Low-sodium or reduced-sodium cheese. Cream cheese. Yogurt. Fats and oils Unsalted butter. Unsalted margarine with no trans fat. Vegetable oils such as canola or olive oils. Seasonings and other foods Fresh and dried herbs and spices. Salt-free seasonings. Low-sodium mustard and ketchup.  Sodium-free salad dressing. Sodium-free light mayonnaise. Fresh or refrigerated horseradish. Lemon juice. Vinegar. Homemade, reduced-sodium, or low-sodium soups. Unsalted popcorn and pretzels. Low-salt or salt-free chips. What foods are not recommended? The items listed may not be a complete list. Talk with your dietitian about what dietary choices are best for you. Grains Instant hot cereals. Bread stuffing, pancake, and biscuit mixes. Croutons. Seasoned rice or pasta mixes. Noodle soup cups. Boxed or frozen macaroni and cheese. Regular salted crackers. Self-rising flour. Vegetables Sauerkraut, pickled vegetables, and relishes. Olives. [place] fries. Onion rings. Regular canned vegetables (not low-sodium or reduced-sodium). Regular canned tomato sauce and paste (not low-sodium or reduced-sodium). Regular tomato and vegetable juice (not low-sodium or reduced-sodium). Frozen vegetables in sauces. Meats and other protein foods Meat or fish that is salted, canned, smoked, spiced, or pickled. Bacon, ham, sausage, hotdogs, corned beef, chipped beef, packaged lunch meats, salt pork, jerky, pickled herring, anchovies, regular canned tuna, sardines, salted nuts. Dairy Processed cheese and cheese spreads. Cheese curds. Blue cheese. Feta cheese. String cheese. Regular cottage cheese. Buttermilk. Canned milk. Fats and oils Salted butter. Regular margarine. Ghee. Bacon fat. Seasonings and other foods Onion salt, garlic salt, seasoned salt, table salt, and sea salt. Canned and packaged gravies. Worcestershire sauce. Tartar sauce. Barbecue sauce. Teriyaki sauce. Soy sauce, including reduced-sodium. Steak sauce. Fish sauce. Oyster sauce. Cocktail sauce. Horseradish that you find on the shelf. Regular ketchup and mustard. Meat flavorings and tenderizers. Bouillon cubes. Hot sauce and Tabasco sauce. Premade or packaged marinades. Premade or packaged taco seasonings. Relishes. Regular salad dressings. Salsa. Potato and  tortilla chips. Corn chips and puffs. Salted popcorn and pretzels. Canned or dried soups. Pizza. Frozen entrees and pot pies. Summary  Eating less sodium can help lower your blood pressure, reduce swelling, and protect your heart, liver, and kidneys.  Most people on this plan should limit their sodium intake to 1,500-2,000 mg (milligrams) of sodium each day.  Canned, boxed, and frozen foods are high in sodium. Restaurant foods, fast foods, and pizza are also very high in sodium. You also get sodium by adding salt to food.  Try to cook at home, eat more fresh fruits and vegetables, and eat less fast food, canned, processed, or prepared foods. This information is not intended to replace advice given to you by your health care provider. Make sure you discuss any questions you have with your health care provider. Document Released: 11/27/2001 Document Revised: 05/31/2016 Document Reviewed: 05/31/2016 Elsevier Interactive Patient Education  [employer].

## 2017-10-25 NOTE — Progress Notes (Signed)
10/25/2017 Patricia Randall   [birth year]  [id number]  Primary Physician Leonard Downing, MD Primary Cardiologist: Dr. Marlou Porch   Reason for Visit/CC: [facility] f/u for systolic HF  HPI:  Patricia Randall a 84 y.o.femalewith a hx of systolic heart failure with initial LVEF of 20-25% in 2014 with repeat echo with improvementinLVEF to 60-65% in 2015, atrial fibrillation onCoumadin(s/p failedTEE/DCCV in 2014), LE claudication,HLD, HTN and DM II.  She was recently admitted to [facility] in April 2019 given complaints of dyspnea and was found to be in acute on chronic heart failure.  Marked cardiomegaly was noted on her chest x-ray.  BNP was slightly abnormal at 585.  She was treated with IV Lasix with improvement in symptoms.  Repeat echocardiogram was performed and showed reduction in EF back to 30 to 35%.  Medical therapy was recommended.  Her ACE inhibitor was held due to acute kidney injury.  She was treated with carvedilol 6.25 mg twice daily.  Blood pressure did not allow for hydralazine nor Imdur.  She was transitioned from IV Lasix to p.o. Lasix.  She was also continued on lovastatin for hyperlipidemia.  Coumadin was continued for atrial fibrillation.  Her A. fib was monitored during her hospitalization and this remained rate controlled.  In addition to acute on chronic systolic heart failure, the patient was also treated for lower extremity cellulitis.  She was placed on p.o. Doxycycline. She was also noted to have a LE ulcer on her right LE and wound care RN assisted with management.   She presents back to clinic today for post hospital follow-up.  She had basic laboratory work done at her PCP office on Oct 21, 2017 which showed improvement in renal function.  Serum creatinine was 2.17, down from 2.58 at time of hospital discharge.  Potassium was normal at 3.9. BNP however was elevated at 840. She notes her PCP instructed to increase her lasix, alternating from 20 mg daily to 40 mg  the next. She notes she has been doing this at home.  Her PCP follows her Coumadin. She reports full med compliance but has not yet taken her morning meds. Her BP is 140/78 before Coreg and Lasix. She denies exertional dyspnea. She has occasional orthopnea but no PND. She only sleeps with 1 pillow. Her afib is rate controlled.     Current Meds  Medication Sig  . Biotin 10 MG CAPS Take 10 mg by mouth daily.  . carvedilol (COREG) 6.25 MG tablet TAKE 1 AND 1/2 TABLETS BY MOUTH TWICE A DAY WITH A MEAL  . cholecalciferol (VITAMIN D) 1000 units tablet Take 1,000 Units by mouth daily.  . feeding supplement, GLUCERNA SHAKE, (GLUCERNA SHAKE) LIQD Take 237 mLs by mouth daily.  . furosemide (LASIX) 20 MG tablet Take 1 tablet (20 mg total) by mouth daily.  Marland Kitchen glimepiride (AMARYL) 4 MG tablet Take 1 tablet (4 mg total) by mouth daily before breakfast.  . latanoprost (XALATAN) 0.005 % ophthalmic solution Place 1 drop into both eyes at bedtime.  . lovastatin (MEVACOR) 40 MG tablet Take 40 mg by mouth at bedtime.  . minocycline (MINOCIN,DYNACIN) 100 MG capsule TAKE ONE CAPSULE BY MOUTH TWICE A DAY X 10 DAYS  . polyethylene glycol (MIRALAX / GLYCOLAX) packet Take 17 g by mouth daily as needed for mild constipation.  . silver sulfADIAZINE (SILVADENE) 1 % cream Apply to affected area daily  . warfarin (COUMADIN) 2 MG tablet Take 1 tablet (2 mg total) by mouth daily. (  Patient taking differently: Take 1-2 mg by mouth daily. Take 2 tablets (4mg ) for two days then 1 tablet (2mg ) for a day then 4 mg for the next two days then 2 mg.)   No Known Allergies Past Medical History:  Diagnosis Date  . Atrial fibrillation ([place])    a. 03/2013 s/p TEE/DCCV;  b. 03/2013 Eliquis initiated.  . Chronic systolic CHF (congestive heart failure) ([place])    a. 03/2013 Echo: EF 20-25%, diff HK, mild to mod MR, sev dil LA, mild RV dysfxn, sev dil RA, mod TR.  . Claudication ([place])   . Glaucoma   . High cholesterol   . Hypertension   .  Type II diabetes mellitus (HCC)    Family History  Problem Relation Age of Onset  . Other Mother        died in her 82's - 'just got sick.'  . Other Father        died @ 75, unknown cause.  . Diabetes Brother   . Other Unknown        12 siblings + 7 more 1/2 siblings.   Past Surgical History:  Procedure Laterality Date  . CARDIOVERSION N/A 04/02/2013   Procedure: CARDIOVERSION;  Surgeon: Thayer Headings, MD;  Location: [place];  Service: Cardiovascular;  Laterality: N/A;  Spoke with Tom   . ESOPHAGOGASTRODUODENOSCOPY N/A 05/26/2016   Procedure: ESOPHAGOGASTRODUODENOSCOPY (EGD);  Surgeon: Ladene Artist, MD;  Location: [facility] ENDOSCOPY;  Service: Endoscopy;  Laterality: N/A;  . EYE SURGERY Right    "had blood in it"   . TEE WITHOUT CARDIOVERSION N/A 04/02/2013   Procedure: TRANSESOPHAGEAL ECHOCARDIOGRAM (TEE);  Surgeon: Thayer Headings, MD;  Location: [facility] ENDOSCOPY;  Service: Cardiovascular;  Laterality: N/A;   Social History   Socioeconomic History  . Marital status: Widowed    Spouse name: Not on file  . Number of children: Not on file  . Years of education: Not on file  . Highest education level: Not on file  Occupational History  . Not on file  Social Needs  . Financial resource strain: Not on file  . Food insecurity:    Worry: Not on file    Inability: Not on file  . Transportation needs:    Medical: Not on file    Non-medical: Not on file  Tobacco Use  . Smoking status: Never Smoker  . Smokeless tobacco: Never Used  Substance and Sexual Activity  . Alcohol use: No  . Drug use: No  . Sexual activity: Never  Lifestyle  . Physical activity:    Days per week: Not on file    Minutes per session: Not on file  . Stress: Not on file  Relationships  . Social connections:    Talks on phone: Not on file    Gets together: Not on file    Attends religious service: Not on file    Active member of club or organization: Not on file    Attends meetings of clubs or  organizations: Not on file    Relationship status: Not on file  . Intimate partner violence:    Fear of current or ex partner: Not on file    Emotionally abused: Not on file    Physically abused: Not on file    Forced sexual activity: Not on file  Other Topics Concern  . Not on file  Social History Narrative   Lives in [place] with her son.     Review of Systems: General: negative  for chills, fever, night sweats or weight changes.  Cardiovascular: negative for chest pain, dyspnea on exertion, edema, orthopnea, palpitations, paroxysmal nocturnal dyspnea or shortness of breath Dermatological: negative for rash Respiratory: negative for cough or wheezing Urologic: negative for hematuria Abdominal: negative for nausea, vomiting, diarrhea, bright red blood per rectum, melena, or hematemesis Neurologic: negative for visual changes, syncope, or dizziness All other systems reviewed and are otherwise negative except as noted above.   Physical Exam:  Blood pressure 140/78, pulse 72, height 5' (1.524 m), weight 151 lb (68.5 kg).  General appearance: alert, cooperative and no distress Neck: no carotid bruit and no JVD Lungs: clear to auscultation bilaterally Heart: irregularly irregular rhythm and regular rate Extremities: trace bilateral LEE Pulses: 2+ and symmetric Skin: Skin color, texture, turgor normal. No rashes or lesions Neurologic: Grossly normal  EKG not performed -- personally reviewed   ASSESSMENT AND PLAN:   1. Systolic HF: volume stable. Trace edema on exam. Repeat BNP at PCP office 10/21/17 was abnormal at 840. SCr improved since hospitalization, down from 2.5>>2.17. Pt notes her PCP instructed her to increase her lasix, alternating days, 20 mg daily followed by 40 mg daily, back to 20 mg and so forth. She denies resting dyspnea and no exertional dyspnea. EF 30-35% on recent echo. History of nonischemic CM in the past. No plans for cath given lack of CP and CKD with SCr >2.  Continue medical therapy. Options have been limited due to CKD and BP. Her ACE-I was discontinued due to worsening renal function. She was unable to tolerate addition of hydralazine and Imdur during hospitalization due to borderline low BP. Her BP today, prior to meds, is 140/78. She is scheduled to take 6.25 mg of Coreg, in addition of Lasix (20-40 mg daily). I don't think she will have too much BP room for further med titration at this time. We disused the importance of daily weights and low sodium diet. Continue current regimen. She knows to call our office if > 3 lb weight gain in 24 hrs or > 5 lb in 1 week. F/u with Dr. Marlou Porch in 2 months.  2. Chronic Atrial Fibrillation: rate is controlled with Coreg. Asymptomatic. H/o failed DCCV in the past. She is on chronic coumadin for a/c. INRs followed by PCP.   3. HTN: controlled on current regimen.  4. DM: per PCP.  5. LE Wound: her PCP has arranged for f/u in the wound clinic. Appt schedule for later this week.   6. CKD: Baseline SCr ~2.1. Repeat Labs 10/21/17 at PCP office showed SCr returned to baseline, since recent hospital admit.  Follow-Up w/ Dr. Marlou Porch in 2 months.   Patricia Randall, MHS [facility] 10/25/2017 11:04 AM

## 2017-11-13 ENCOUNTER — Inpatient Hospital Stay (HOSPITAL_COMMUNITY)
Admission: EM | Admit: 2017-11-13 | Discharge: 2017-11-15 | DRG: 291 | Disposition: A | Discharge status: Home / Self Care | Payer: Medicare Other | Attending: Family Medicine | Admitting: Family Medicine

## 2017-11-13 ENCOUNTER — Other Ambulatory Visit: Payer: Self-pay

## 2017-11-13 ENCOUNTER — Encounter (HOSPITAL_COMMUNITY): Payer: Self-pay | Admitting: Emergency Medicine

## 2017-11-13 ENCOUNTER — Emergency Department (HOSPITAL_COMMUNITY): Payer: Medicare Other

## 2017-11-13 DIAGNOSIS — Z79899 Other long term (current) drug therapy: Secondary | ICD-10-CM

## 2017-11-13 DIAGNOSIS — E1122 Type 2 diabetes mellitus with diabetic chronic kidney disease: Secondary | ICD-10-CM | POA: Diagnosis not present

## 2017-11-13 DIAGNOSIS — Z7901 Long term (current) use of anticoagulants: Secondary | ICD-10-CM | POA: Diagnosis not present

## 2017-11-13 DIAGNOSIS — R0602 Shortness of breath: Secondary | ICD-10-CM | POA: Diagnosis present

## 2017-11-13 DIAGNOSIS — L97919 Non-pressure chronic ulcer of unspecified part of right lower leg with unspecified severity: Secondary | ICD-10-CM | POA: Diagnosis not present

## 2017-11-13 DIAGNOSIS — I130 Hypertensive heart and chronic kidney disease with heart failure and stage 1 through stage 4 chronic kidney disease, or unspecified chronic kidney disease: Principal | ICD-10-CM | POA: Diagnosis present

## 2017-11-13 DIAGNOSIS — Z7984 Long term (current) use of oral hypoglycemic drugs: Secondary | ICD-10-CM | POA: Diagnosis not present

## 2017-11-13 DIAGNOSIS — I5041 Acute combined systolic (congestive) and diastolic (congestive) heart failure: Secondary | ICD-10-CM

## 2017-11-13 DIAGNOSIS — I5043 Acute on chronic combined systolic (congestive) and diastolic (congestive) heart failure: Secondary | ICD-10-CM | POA: Diagnosis present

## 2017-11-13 DIAGNOSIS — I482 Chronic atrial fibrillation: Secondary | ICD-10-CM | POA: Diagnosis not present

## 2017-11-13 DIAGNOSIS — N183 Chronic kidney disease, stage 3 unspecified: Secondary | ICD-10-CM

## 2017-11-13 DIAGNOSIS — I4821 Permanent atrial fibrillation: Secondary | ICD-10-CM | POA: Diagnosis present

## 2017-11-13 DIAGNOSIS — Z833 Family history of diabetes mellitus: Secondary | ICD-10-CM | POA: Diagnosis not present

## 2017-11-13 DIAGNOSIS — Z23 Encounter for immunization: Secondary | ICD-10-CM | POA: Diagnosis not present

## 2017-11-13 DIAGNOSIS — E1151 Type 2 diabetes mellitus with diabetic peripheral angiopathy without gangrene: Secondary | ICD-10-CM | POA: Diagnosis present

## 2017-11-13 DIAGNOSIS — H409 Unspecified glaucoma: Secondary | ICD-10-CM | POA: Diagnosis not present

## 2017-11-13 DIAGNOSIS — E7800 Pure hypercholesterolemia, unspecified: Secondary | ICD-10-CM | POA: Diagnosis not present

## 2017-11-13 DIAGNOSIS — I4510 Unspecified right bundle-branch block: Secondary | ICD-10-CM | POA: Diagnosis present

## 2017-11-13 DIAGNOSIS — E119 Type 2 diabetes mellitus without complications: Secondary | ICD-10-CM

## 2017-11-13 DIAGNOSIS — S81802A Unspecified open wound, left lower leg, initial encounter: Secondary | ICD-10-CM

## 2017-11-13 DIAGNOSIS — I5023 Acute on chronic systolic (congestive) heart failure: Secondary | ICD-10-CM | POA: Diagnosis not present

## 2017-11-13 DIAGNOSIS — L03115 Cellulitis of right lower limb: Secondary | ICD-10-CM | POA: Diagnosis present

## 2017-11-13 LAB — BASIC METABOLIC PANEL
ANION GAP: 9 (ref 5–15)
BUN: 37 mg/dL — ABNORMAL HIGH (ref 6–20)
CO2: 23 mmol/L (ref 22–32)
Calcium: 8.7 mg/dL — ABNORMAL LOW (ref 8.9–10.3)
Chloride: 111 mmol/L (ref 101–111)
Creatinine, Ser: 1.63 mg/dL — ABNORMAL HIGH (ref 0.44–1.00)
GFR calc Af Amer: 32 mL/min — ABNORMAL LOW (ref >60)
GFR, EST NON AFRICAN AMERICAN: 27 mL/min — AB (ref >60)
GLUCOSE: 74 mg/dL (ref 65–99)
POTASSIUM: 3.6 mmol/L (ref 3.5–5.1)
SODIUM: 143 mmol/L (ref 135–145)

## 2017-11-13 LAB — CBG MONITORING, ED: GLUCOSE-CAPILLARY: 103 mg/dL — AB (ref 65–99)

## 2017-11-13 LAB — CBC
HEMATOCRIT: 39.9 % (ref 36.0–46.0)
HEMOGLOBIN: 13.1 g/dL (ref 12.0–15.0)
MCH: 27.3 pg (ref 26.0–34.0)
MCHC: 32.8 g/dL (ref 30.0–36.0)
MCV: 83.3 fL (ref 78.0–100.0)
Platelets: 163 10*3/uL (ref 150–400)
RBC: 4.79 MIL/uL (ref 3.87–5.11)
RDW: 19.2 % — ABNORMAL HIGH (ref 11.5–15.5)
WBC: 4 10*3/uL (ref 4.0–10.5)

## 2017-11-13 LAB — I-STAT TROPONIN, ED
TROPONIN I, POC: 0.01 ng/mL (ref 0.00–0.08)
TROPONIN I, POC: 0 ng/mL (ref 0.00–0.08)

## 2017-11-13 LAB — PROTIME-INR
INR: 1.84
PROTHROMBIN TIME: 21.1 s — AB (ref 11.4–15.2)

## 2017-11-13 LAB — MAGNESIUM: MAGNESIUM: 1.6 mg/dL — AB (ref 1.7–2.4)

## 2017-11-13 MED ORDER — FUROSEMIDE 10 MG/ML IJ SOLN
20.0000 mg | Freq: Once | INTRAMUSCULAR | Status: AC
  Administered 2017-11-13: 20 mg via INTRAVENOUS
  Filled 2017-11-13: qty 2

## 2017-11-13 MED ORDER — INSULIN ASPART 100 UNIT/ML ~~LOC~~ SOLN
0.0000 [IU] | Freq: Three times a day (TID) | SUBCUTANEOUS | Status: DC
  Administered 2017-11-14 – 2017-11-15 (×3): 2 [IU] via SUBCUTANEOUS
  Administered 2017-11-15: 1 [IU] via SUBCUTANEOUS

## 2017-11-13 MED ORDER — GLUCERNA SHAKE PO LIQD
237.0000 mL | ORAL | Status: DC
Start: 2017-11-14 — End: 2017-11-15
  Administered 2017-11-14 – 2017-11-15 (×2): 237 mL via ORAL
  Filled 2017-11-13 (×3): qty 237

## 2017-11-13 MED ORDER — MAGNESIUM OXIDE 400 (241.3 MG) MG PO TABS
800.0000 mg | ORAL_TABLET | Freq: Once | ORAL | Status: AC
  Administered 2017-11-13: 800 mg via ORAL
  Filled 2017-11-13: qty 2

## 2017-11-13 MED ORDER — ACETAMINOPHEN 325 MG PO TABS: 650.0000 mg | ORAL_TABLET | ORAL | Status: DC | PRN

## 2017-11-13 MED ORDER — SILVER SULFADIAZINE 1 % EX CREA
TOPICAL_CREAM | Freq: Every day | CUTANEOUS | Status: DC
  Administered 2017-11-14: 1 via TOPICAL
  Administered 2017-11-15: 08:00:00 via TOPICAL
  Filled 2017-11-13: qty 85

## 2017-11-13 MED ORDER — SODIUM CHLORIDE 0.9% FLUSH
3.0000 mL | Freq: Two times a day (BID) | INTRAVENOUS | Status: DC
  Administered 2017-11-14 – 2017-11-15 (×3): 3 mL via INTRAVENOUS

## 2017-11-13 MED ORDER — VITAMIN D 1000 UNITS PO TABS
1000.0000 [IU] | ORAL_TABLET | Freq: Every day | ORAL | Status: DC
  Administered 2017-11-14 – 2017-11-15 (×2): 1000 [IU] via ORAL
  Filled 2017-11-13 (×2): qty 1

## 2017-11-13 MED ORDER — SODIUM CHLORIDE 0.9% FLUSH: 3.0000 mL | INTRAVENOUS | Status: DC | PRN

## 2017-11-13 MED ORDER — POLYETHYLENE GLYCOL 3350 17 G PO PACK: 17.0000 g | PACK | Freq: Every day | ORAL | Status: DC | PRN

## 2017-11-13 MED ORDER — CARVEDILOL 3.125 MG PO TABS
9.3750 mg | ORAL_TABLET | Freq: Two times a day (BID) | ORAL | Status: DC
  Administered 2017-11-14 – 2017-11-15 (×3): 9.375 mg via ORAL
  Filled 2017-11-13 (×3): qty 1

## 2017-11-13 MED ORDER — PRAVASTATIN SODIUM 40 MG PO TABS
40.0000 mg | ORAL_TABLET | Freq: Every day | ORAL | Status: DC
  Administered 2017-11-14: 40 mg via ORAL
  Filled 2017-11-13: qty 1

## 2017-11-13 MED ORDER — SODIUM CHLORIDE 0.9 % IV SOLN
250.0000 mL | INTRAVENOUS | Status: DC | PRN
Start: 2017-11-13 — End: 2017-11-15

## 2017-11-13 MED ORDER — INSULIN ASPART 100 UNIT/ML ~~LOC~~ SOLN: 0.0000 [IU] | Freq: Every day | SUBCUTANEOUS | Status: DC

## 2017-11-13 MED ORDER — METOPROLOL TARTRATE 5 MG/5ML IV SOLN
5.0000 mg | Freq: Once | INTRAVENOUS | Status: AC
  Administered 2017-11-13: 5 mg via INTRAVENOUS
  Filled 2017-11-13: qty 5

## 2017-11-13 MED ORDER — HYDRALAZINE HCL 20 MG/ML IJ SOLN
10.0000 mg | INTRAMUSCULAR | Status: DC | PRN
  Administered 2017-11-14: 10 mg via INTRAVENOUS
  Filled 2017-11-13: qty 1

## 2017-11-13 MED ORDER — FUROSEMIDE 10 MG/ML IJ SOLN
40.0000 mg | Freq: Two times a day (BID) | INTRAMUSCULAR | Status: DC
  Administered 2017-11-14 – 2017-11-15 (×3): 40 mg via INTRAVENOUS
  Filled 2017-11-13 (×3): qty 4

## 2017-11-13 MED ORDER — LATANOPROST 0.005 % OP SOLN
1.0000 [drp] | Freq: Every day | OPHTHALMIC | Status: DC
  Administered 2017-11-14: 1 [drp] via OPHTHALMIC
  Filled 2017-11-13: qty 2.5

## 2017-11-13 MED ORDER — ONDANSETRON HCL 4 MG/2ML IJ SOLN
4.0000 mg | Freq: Four times a day (QID) | INTRAMUSCULAR | Status: DC | PRN
  Filled 2017-11-13: qty 2

## 2017-11-13 NOTE — ED Provider Notes (Signed)
[facility] EMERGENCY DEPARTMENT Provider Note   CSN: [id number] Arrival date & time: 11/13/17  1512     History   Chief Complaint Chief Complaint  Patient presents with  . Shortness of Breath    HPI Patricia Randall is a 84 y.o. female.  HPI 83 year old female comes in with chief complaint of shortness of breath. Patient has history of A. fib and is on Eliquis.  She also has systolic heart failure with a EF 20 to 25%, type 2 diabetes and a diabetic wound in her right lower extremity.  Patient states that she has had shortness of breath for the last 2 weeks and it is getting worse.  Her shortness of breath is worse with exertion and when she is trying to lay flat at nighttime.  Patient has been taking her medications as prescribed.  She does not think she has gained weight.  There is no associated fevers, chills, chest pain.  Past Medical History:  Diagnosis Date  . Atrial fibrillation ([place])    a. 03/2013 s/p TEE/DCCV;  b. 03/2013 Eliquis initiated.  . Chronic systolic CHF (congestive heart failure) ([place])    a. 03/2013 Echo: EF 20-25%, diff HK, mild to mod MR, sev dil LA, mild RV dysfxn, sev dil RA, mod TR.  . Claudication ([place])   . Glaucoma   . High cholesterol   . Hypertension   . Type II diabetes mellitus [facility])     Patient Active Problem List   Diagnosis Date Noted  . Type II diabetes mellitus ([place]) 11/13/2017  . Ulcer of right lower extremity ([place]) 11/13/2017  . Acute on chronic systolic CHF (congestive heart failure) ([place]) 09/26/2017  . Protein-calorie malnutrition, severe 07/05/2015  . CKD (chronic kidney disease), stage III ([place])   . Essential hypertension, benign 03/13/2014  . Cardiomyopathy ([place]) 04/01/2013  . Permanent atrial fibrillation ([place]) 03/31/2013    Past Surgical History:  Procedure Laterality Date  . CARDIOVERSION N/A 04/02/2013   Procedure: CARDIOVERSION;  Surgeon: Thayer Headings, MD;  Location: [place];  Service:  Cardiovascular;  Laterality: N/A;  Spoke with Tom   . ESOPHAGOGASTRODUODENOSCOPY N/A 05/26/2016   Procedure: ESOPHAGOGASTRODUODENOSCOPY (EGD);  Surgeon: Ladene Artist, MD;  Location: [facility] ENDOSCOPY;  Service: Endoscopy;  Laterality: N/A;  . EYE SURGERY Right    "had blood in it"   . TEE WITHOUT CARDIOVERSION N/A 04/02/2013   Procedure: TRANSESOPHAGEAL ECHOCARDIOGRAM (TEE);  Surgeon: Thayer Headings, MD;  Location: [place];  Service: Cardiovascular;  Laterality: N/A;     OB History   None      Home Medications    Prior to Admission medications   Medication Sig Start Date End Date Taking? Authorizing Provider  carvedilol (COREG) 6.25 MG tablet TAKE 1 AND 1/2 TABLETS BY MOUTH TWICE A DAY WITH A MEAL 03/01/17  Yes Bensimhon, Shaune Pascal, MD  cholecalciferol (VITAMIN D) 1000 units tablet Take 1,000 Units by mouth daily.   Yes [provider]  feeding supplement, GLUCERNA SHAKE, (GLUCERNA SHAKE) LIQD Take 237 mLs by mouth daily.   Yes [provider]  furosemide (LASIX) 20 MG tablet Take 1 tablet (20 mg total) by mouth daily. Patient taking differently: Take 20-40 mg by mouth See admin instructions. Take 2 tablets (40 mg) by mouth 1st day, then take 1 tablet (20 mg) 2nd day, then repeat 10/01/17  Yes Lavina Hamman, MD  glimepiride (AMARYL) 4 MG tablet Take 1 tablet (4 mg total) by mouth daily before breakfast.  09/29/17  Yes Lavina Hamman, MD  latanoprost (XALATAN) 0.005 % ophthalmic solution Place 1 drop into both eyes at bedtime.   Yes [provider]  lovastatin (MEVACOR) 40 MG tablet Take 40 mg by mouth at bedtime.   Yes [provider]  Multiple Vitamins-Minerals (HAIR/SKIN/NAILS/BIOTIN PO) Take 1 tablet by mouth daily.   Yes [provider]  polyethylene glycol (MIRALAX / GLYCOLAX) packet Take 17 g by mouth daily as needed for mild constipation. 09/29/17  Yes Lavina Hamman, MD  warfarin (COUMADIN) 2 MG tablet Take 1 tablet (2 mg total) by  mouth daily. Patient taking differently: Take 2-4 mg by mouth See admin instructions. Take 2 tablets (4mg ) by mouth daily after breakfast for two days then 1 tablet (2mg ) next day after breakfast, then repeat (2,2,1) 05/31/16  Yes Nita Sells, MD  silver sulfADIAZINE (SILVADENE) 1 % cream Apply to affected area daily Patient not taking: Reported on 11/13/2017 09/30/17 09/30/18  Lavina Hamman, MD    Family History Family History  Problem Relation Age of Onset  . Other Mother        died in her 73's - 'just got sick.'  . Other Father        died @ 65, unknown cause.  . Diabetes Brother   . Other Unknown        12 siblings + 7 more 1/2 siblings.    Social History Social History   Tobacco Use  . Smoking status: Never Smoker  . Smokeless tobacco: Never Used  Substance Use Topics  . Alcohol use: No  . Drug use: No     Allergies   Patient has no known allergies.   Review of Systems Review of Systems  Constitutional: Positive for activity change.  Respiratory: Positive for shortness of breath.   Cardiovascular: Positive for leg swelling. Negative for chest pain.  Skin: Positive for wound.  Hematological: Bruises/bleeds easily.  All other systems reviewed and are negative.    Physical Exam Updated Vital Signs BP (!) 148/103 (BP Location: Right Arm)   Pulse 94   Temp 98.1 F (36.7 C) (Oral)   Resp 18   Ht 5\' 1"  (1.549 m)   Wt 67.6 kg (149 lb)   SpO2 100%   BMI 28.15 kg/m   Physical Exam  Constitutional: She is oriented to person, place, and time. She appears well-developed.  HENT:  Head: Normocephalic and atraumatic.  Eyes: EOM are normal.  Neck: Normal range of motion. Neck supple. JVD present.  Cardiovascular: Normal rate.  Irregular  Pulmonary/Chest: Effort normal. She has decreased breath sounds. She has rales in the right lower field and the left lower field.  Abdominal: Bowel sounds are normal.  Musculoskeletal:  Right lower extremity has an  ulcerated lesion with surrounding erythema.  Patient has bilateral pitting edema, with right worse than left  Neurological: She is alert and oriented to person, place, and time.  Skin: Skin is warm and dry.  Nursing note and vitals reviewed.    ED Treatments / Results  Labs (all labs ordered are listed, but only abnormal results are displayed) Labs Reviewed  BASIC METABOLIC PANEL - Abnormal; Notable for the following components:      Result Value   BUN 37 (*)    Creatinine, Ser 1.63 (*)    Calcium 8.7 (*)    GFR calc non Af Amer 27 (*)    GFR calc Af Amer 32 (*)    All other components within  normal limits  CBC - Abnormal; Notable for the following components:   RDW 19.2 (*)    All other components within normal limits  MAGNESIUM - Abnormal; Notable for the following components:   Magnesium 1.6 (*)    All other components within normal limits  PROTIME-INR - Abnormal; Notable for the following components:   Prothrombin Time 21.1 (*)    All other components within normal limits  BASIC METABOLIC PANEL  PROTIME-INR  I-STAT TROPONIN, ED  I-STAT TROPONIN, ED    EKG EKG Interpretation  Date/Time:  Sunday Nov 13 2017 16:07:49 EDT Ventricular Rate:  109 PR Interval:    QRS Duration: 100 QT Interval:  372 QTC Calculation: 500 R Axis:   -81 Text Interpretation:  Atrial fibrillation with rapid ventricular response Left axis deviation Incomplete right bundle branch block Minimal voltage criteria for LVH, may be normal variant Septal infarct , age undetermined ST & T wave abnormality, consider lateral ischemia Abnormal ECG Nonspecific ST and T wave abnormality twi in the lateral leads are not new No significant change since last tracing Confirmed by Varney Biles [phone]) on 11/13/2017 6:48:30 PM   Radiology Dg Chest 2 View  Result Date: 11/13/2017 CLINICAL DATA:  Shortness of breath for 2-3 weeks. EXAM: CHEST - 2 VIEW COMPARISON:  09/26/2017 FINDINGS: Enlarged cardiac silhouette,  not significantly changed. Mediastinal contours appear intact. Calcific atherosclerotic disease of the aorta. There is no evidence of focal airspace consolidation, pleural effusion or pneumothorax. Interstitial pulmonary edema. Osseous structures are without acute abnormality. Soft tissues are grossly normal. IMPRESSION: Enlarged cardiac silhouette which may be due to congestive heart failure or pericardial effusion. Interstitial pulmonary edema. Calcific atherosclerotic disease and tortuosity of the aorta. Electronically Signed   By: Fidela Salisbury M.D.   On: 11/13/2017 16:34    Procedures Procedures (including critical care time)  Medications Ordered in ED Medications  carvedilol (COREG) tablet 9.375 mg (has no administration in time range)  cholecalciferol (VITAMIN D) tablet 1,000 Units (has no administration in time range)  feeding supplement (GLUCERNA SHAKE) (GLUCERNA SHAKE) liquid 237 mL (has no administration in time range)  latanoprost (XALATAN) 0.005 % ophthalmic solution 1 drop (has no administration in time range)  pravastatin (PRAVACHOL) tablet 40 mg (has no administration in time range)  polyethylene glycol (MIRALAX / GLYCOLAX) packet 17 g (has no administration in time range)  silver sulfADIAZINE (SILVADENE) 1 % cream (has no administration in time range)  sodium chloride flush (NS) 0.9 % injection 3 mL (has no administration in time range)  sodium chloride flush (NS) 0.9 % injection 3 mL (has no administration in time range)  0.9 %  sodium chloride infusion (has no administration in time range)  acetaminophen (TYLENOL) tablet 650 mg (has no administration in time range)  ondansetron (ZOFRAN) injection 4 mg (has no administration in time range)  furosemide (LASIX) injection 40 mg (has no administration in time range)  insulin aspart (novoLOG) injection 0-9 Units (has no administration in time range)  insulin aspart (novoLOG) injection 0-5 Units (has no administration in time  range)  furosemide (LASIX) injection 20 mg (has no administration in time range)  metoprolol tartrate (LOPRESSOR) injection 5 mg (has no administration in time range)  hydrALAZINE (APRESOLINE) injection 10 mg (has no administration in time range)  magnesium oxide (MAG-OX) tablet 800 mg (has no administration in time range)  furosemide (LASIX) injection 20 mg (20 mg Intravenous Given 11/13/17 1917)     Initial Impression / Assessment and Plan / ED  Course  I have reviewed the triage vital signs and the nursing notes.  Pertinent labs & imaging results that were available during my care of the patient were reviewed by me and considered in my medical decision making (see chart for details).     84 year old female comes in with chief complaint of shortness of breath.  Patient has history of A. fib and CHF.  Her symptoms are consistent with CHF exacerbation and chest x-ray shows interstitial edema.  IV Lasix given.  We will admit patient to the hospital for optimization.  Additionally, patient also has wound in her right lower extremity.  She is also noted to be in A. fib with heart rate in the 90s.  One of the possibilities is that her wound could be infected, bleeding to A. fib with RVR, which in turn is causing decompensation of the CHF.  I entertained this possibility with the admitting team, Dr. Myna Hidalgo will monitor the wound for now but does not think antibiotic needs to be started  Final Clinical Impressions(s) / ED Diagnoses   Final diagnoses:  Acute combined systolic and diastolic congestive heart failure ([place])  Leg wound, left, initial encounter    ED Discharge Orders    None       Varney Biles, MD 11/13/17 2039

## 2017-11-13 NOTE — Progress Notes (Signed)
ANTICOAGULATION CONSULT NOTE - Initial Consult  Pharmacy Consult for warfarin Indication: Atrial fibrilation  No Known Allergies  Patient Measurements: Height: 5\' 1"  (154.9 cm) Weight: 149 lb (67.6 kg) IBW/kg (Calculated) : 47.8   Vital Signs: Temp: 98.1 F (36.7 C) (05/26 1641) Temp Source: Oral (05/26 1641) BP: 148/103 (05/26 1828) Pulse Rate: 94 (05/26 1828)  Labs: Recent Labs    11/13/17 1609 11/13/17 1913  HGB 13.1  --   HCT 39.9  --   PLT 163  --   LABPROT  --  21.1*  INR  --  1.84  CREATININE 1.63*  --     Estimated Creatinine Clearance: 21.8 mL/min (A) (by C-G formula based on SCr of 1.63 mg/dL (H)).   Medical History: Past Medical History:  Diagnosis Date  . Atrial fibrillation ([place])    a. 03/2013 s/p TEE/DCCV;  b. 03/2013 Eliquis initiated.  . Chronic systolic CHF (congestive heart failure) ([place])    a. 03/2013 Echo: EF 20-25%, diff HK, mild to mod MR, sev dil LA, mild RV dysfxn, sev dil RA, mod TR.  . Claudication ([place])   . Glaucoma   . High cholesterol   . Hypertension   . Type II diabetes mellitus (HCC)       Assessment: 84 yo female with Afib on warfarin PTA. Pharmacy asked to continue warfarin inpatient. [facility] INR on admit is 1.84. Home dose 4mg  for 2 days then 2mg  for one day and repeat cycle. Patient has already received warfarin 2mg  today.   Goal of Therapy:  INR 2-3 Monitor platelets by anticoagulation protocol: Yes   Plan:  Hold warfarin tonight as patient has already had today Daily INR Monitor for s/sx of bleeding   Sonakshi Rolland A. Levada Dy, PharmD, [place] Pager: [phone]  11/13/2017,7:51 PM

## 2017-11-13 NOTE — H&P (Signed)
History and Physical    Patricia Randall HAL:[id number] DOB: [birth year] DOA: 11/13/2017  PCP: Leonard Downing, MD   Patient coming from: Home  Chief Complaint: SOB, orthopnea   HPI: Patricia Randall is a 84 y.o. female with medical history significant for chronic systolic CHF, atrial fibrillation on Coumadin, chronic kidney disease stage III, type 2 diabetes mellitus, and right leg ulcer, now presenting to the emergency department with 2 weeks of progressive shortness of breath and orthopnea.  Patient was admitted to the hospital  approximately 6 weeks ago for acute on chronic CHF with acute kidney injury and right lower extremity cellulitis, have been doing quite well at home following the discharge until she noted the insidious development of dyspnea and orthopnea over the past 2 weeks.  She denies chest pain, palpitations, fevers, or chills.  Reports continued adherence with her medications.  ED Course: Upon arrival to the ED, patient is found to beafebrile, saturating adequately on room air, hypertensive to 150/100, and vitals otherwise normal.  EKG features atrial fibrillation with rate 109, LAD, incomplete RBBB, and nonspecific ST-T abnormality.  Chest x-ray is notable for enlarged cardiac silhouette with interstitial edema.  Chemistry panel is notable for creatinine 1.63, consistent with her apparent baseline.  CBC is unremarkable and troponin is negative.  Patient was given IV Lasix in the ED, remains mildly hypertensive and slightly tachycardic, and will be admitted for ongoing evaluation.  Review of Systems:  All other systems reviewed and apart from HPI, are negative.  Past Medical History:  Diagnosis Date  . Atrial fibrillation ([place])    a. 03/2013 s/p TEE/DCCV;  b. 03/2013 Eliquis initiated.  . Chronic systolic CHF (congestive heart failure) ([place])    a. 03/2013 Echo: EF 20-25%, diff HK, mild to mod MR, sev dil LA, mild RV dysfxn, sev dil RA, mod TR.  . Claudication ([place])   . Glaucoma     . High cholesterol   . Hypertension   . Type II diabetes mellitus ([place])     Past Surgical History:  Procedure Laterality Date  . CARDIOVERSION N/A 04/02/2013   Procedure: CARDIOVERSION;  Surgeon: Thayer Headings, MD;  Location: [facility];  Service: Cardiovascular;  Laterality: N/A;  Spoke with Tom   . ESOPHAGOGASTRODUODENOSCOPY N/A 05/26/2016   Procedure: ESOPHAGOGASTRODUODENOSCOPY (EGD);  Surgeon: Ladene Artist, MD;  Location: [facility];  Service: Endoscopy;  Laterality: N/A;  . EYE SURGERY Right    "had blood in it"   . TEE WITHOUT CARDIOVERSION N/A 04/02/2013   Procedure: TRANSESOPHAGEAL ECHOCARDIOGRAM (TEE);  Surgeon: Thayer Headings, MD;  Location: [facility];  Service: Cardiovascular;  Laterality: N/A;     reports that she has never smoked. She has never used smokeless tobacco. She reports that she does not drink alcohol or use drugs.  No Known Allergies  Family History  Problem Relation Age of Onset  . Other Mother        died in her 9's - 'just got sick.'  . Other Father        died @ 60, unknown cause.  . Diabetes Brother   . Other Unknown        12 siblings + 7 more 1/2 siblings.     Prior to Admission medications   Medication Sig Start Date End Date Taking? Authorizing Provider  Biotin 10 MG CAPS Take 10 mg by mouth daily.    [provider]  carvedilol (COREG) 6.25 MG tablet TAKE 1 AND 1/2 TABLETS BY MOUTH TWICE  A DAY WITH A MEAL 03/01/17   Bensimhon, Shaune Pascal, MD  cholecalciferol (VITAMIN D) 1000 units tablet Take 1,000 Units by mouth daily.    [provider]  feeding supplement, GLUCERNA SHAKE, (GLUCERNA SHAKE) LIQD Take 237 mLs by mouth daily.    [provider]  furosemide (LASIX) 20 MG tablet Take 1 tablet (20 mg total) by mouth daily. Patient taking differently: Take 20 mg by mouth daily. ALTERNATING 40 MG OTHER DAY 10/01/17   Lavina Hamman, MD  glimepiride (AMARYL) 4 MG tablet Take 1 tablet (4 mg total) by mouth daily  before breakfast. 09/29/17   Lavina Hamman, MD  latanoprost (XALATAN) 0.005 % ophthalmic solution Place 1 drop into both eyes at bedtime.    [provider]  lovastatin (MEVACOR) 40 MG tablet Take 40 mg by mouth at bedtime.    [provider]  minocycline (MINOCIN,DYNACIN) 100 MG capsule TAKE ONE CAPSULE BY MOUTH TWICE A DAY X 10 DAYS 10/19/17   [provider]  polyethylene glycol (MIRALAX / GLYCOLAX) packet Take 17 g by mouth daily as needed for mild constipation. 09/29/17   Lavina Hamman, MD  silver sulfADIAZINE (SILVADENE) 1 % cream Apply to affected area daily 09/30/17 09/30/18  Lavina Hamman, MD  warfarin (COUMADIN) 2 MG tablet Take 1 tablet (2 mg total) by mouth daily. Patient taking differently: Take 1-2 mg by mouth daily. Take 2 tablets (4mg ) for two days then 1 tablet (2mg ) for a day then 4 mg for the next two days then 2 mg. 05/31/16   Nita Sells, MD    Physical Exam: Vitals:   11/13/17 1603 11/13/17 1641 11/13/17 1733 11/13/17 1828  BP:  123/89 (!) 129/100 (!) 148/103  Pulse:  80 98 94  Resp:  14 16 18   Temp:  98.1 F (36.7 C)    TempSrc:  Oral    SpO2:  100% 97% 100%  Weight: 67.6 kg (149 lb)     Height: 5\' 1"  (1.549 m)         Constitutional: NAD, calm  Eyes: PERTLA, lids and conjunctivae normal ENMT: Mucous membranes are moist. Posterior pharynx clear of any exudate or lesions.   Neck: normal, supple, no masses, no thyromegaly Respiratory: Rales bilaterally. Mild dyspnea with speech. No accessory muscle use.  Cardiovascular: Rate ~100 and irregular. Pretibial edema bilaterally.   Abdomen: No distension, no tenderness, soft. Bowel sounds normal.  Musculoskeletal: no clubbing / cyanosis. No joint deformity upper and lower extremities.    Skin: Distal RLE ulcers without significant erythema and no purulence or foul-odor. Warm, dry, well-perfused. Neurologic: CN 2-12 grossly intact. Sensation intact. Strength 5/5 in all 4 limbs.    Psychiatric: Alert and oriented x 3. Calm, cooperative.     Labs on Admission: I have personally reviewed following labs and imaging studies  CBC: Recent Labs  Lab 11/13/17 1609  WBC 4.0  HGB 13.1  HCT 39.9  MCV 83.3  PLT 678   Basic Metabolic Panel: Recent Labs  Lab 11/13/17 1609  NA 143  K 3.6  CL 111  CO2 23  GLUCOSE 74  BUN 37*  CREATININE 1.63*  CALCIUM 8.7*   GFR: Estimated Creatinine Clearance: 21.8 mL/min (A) (by C-G formula based on SCr of 1.63 mg/dL (H)). Liver Function Tests: No results for input(s): AST, ALT, ALKPHOS, BILITOT, PROT, ALBUMIN in the last 168 hours. No results for input(s): LIPASE, AMYLASE in the last 168 hours. No results for input(s): AMMONIA in  the last 168 hours. Coagulation Profile: No results for input(s): INR, PROTIME in the last 168 hours. Cardiac Enzymes: No results for input(s): CKTOTAL, CKMB, CKMBINDEX, TROPONINI in the last 168 hours. BNP (last 3 results) No results for input(s): PROBNP in the last 8760 hours. HbA1C: No results for input(s): HGBA1C in the last 72 hours. CBG: No results for input(s): GLUCAP in the last 168 hours. Lipid Profile: No results for input(s): CHOL, HDL, LDLCALC, TRIG, CHOLHDL, LDLDIRECT in the last 72 hours. Thyroid Function Tests: No results for input(s): TSH, T4TOTAL, FREET4, T3FREE, THYROIDAB in the last 72 hours. Anemia Panel: No results for input(s): VITAMINB12, FOLATE, FERRITIN, TIBC, IRON, RETICCTPCT in the last 72 hours. Urine analysis:    Component Value Date/Time   COLORURINE YELLOW 09/02/2017 1354   APPEARANCEUR CLEAR 09/02/2017 1354   LABSPEC 1.010 09/02/2017 1354   PHURINE 5.0 09/02/2017 1354   GLUCOSEU NEGATIVE 09/02/2017 1354   HGBUR NEGATIVE 09/02/2017 [place] 09/02/2017 1354   KETONESUR NEGATIVE 09/02/2017 1354   PROTEINUR NEGATIVE 09/02/2017 1354   NITRITE NEGATIVE 09/02/2017 1354   LEUKOCYTESUR NEGATIVE 09/02/2017 1354   Sepsis  Labs: @LABRCNTIP (procalcitonin:4,lacticidven:4) )No results found for this or any previous visit (from the past 240 hour(s)).   Radiological Exams on Admission: Dg Chest 2 View  Result Date: 11/13/2017 CLINICAL DATA:  Shortness of breath for 2-3 weeks. EXAM: CHEST - 2 VIEW COMPARISON:  09/26/2017 FINDINGS: Enlarged cardiac silhouette, not significantly changed. Mediastinal contours appear intact. Calcific atherosclerotic disease of the aorta. There is no evidence of focal airspace consolidation, pleural effusion or pneumothorax. Interstitial pulmonary edema. Osseous structures are without acute abnormality. Soft tissues are grossly normal. IMPRESSION: Enlarged cardiac silhouette which may be due to congestive heart failure or pericardial effusion. Interstitial pulmonary edema. Calcific atherosclerotic disease and tortuosity of the aorta. Electronically Signed   By: Fidela Salisbury M.D.   On: 11/13/2017 16:34    EKG: Independently reviewed. Atrial fibrillation, rate 109, LAD, incomplete RBBB, non-specific ST-T abnormality.   Assessment/Plan   1. Acute on chronic systolic CHF  - Presents with 2 wks progressive SOB and orthopnea, no chest pain, no fever/chills  - Rales and peripheral edema on exam and pulmonary edema on CXR  - Echo done last month with EF 30-35%, moderate LAE, moderate MR and TR  - Diurese with Lasix 40 mg IV q12h, continue Coreg as tolerated, cardiac monitoring, SLIV, daily wts and I/O's, daily chem panel    2. Atrial fibrillation  - Rate in 90 to low 100's in ED  - Given a dose of IV Lopressor on admission   - Continue cardiac monitoring  - CHADS-VASc at least 5 (age x2, gender, CHF, DM) - Continue warfarin    3. CKD stage III  - SCr is 1.63 on admission, consistent with her apparent baseline  - Renally-dose medications, avoid nephrotoxins, daily chem panel during diuresis    4. Type II DM  - A1c was 7.6% last month  - Managed at home with glimepiride, held on  admission   - Follow CBG's and use a low-intensity SSI with Novolog as needed   5. Right leg ulcer  - Following with wound care outpt  - No surrounding erythema, purulent drainage, or systemic features to suggest active infection   - Continue wound care     DVT prophylaxis: warfarin  Code Status: Full  Family Communication: Son updated at bedside Consults called: None Admission status: Inpatient    Vianne Bulls, MD [facility] Pager [phone]  If 7PM-7AM, please contact night-coverage [email or web address] Password [facility]  11/13/2017, 7:33 PM

## 2017-11-13 NOTE — ED Triage Notes (Signed)
C/o SOB x 2 weeks that is worse at night when lying down.  Denies chest pain.

## 2017-11-14 ENCOUNTER — Encounter (HOSPITAL_COMMUNITY): Payer: Self-pay | Admitting: Emergency Medicine

## 2017-11-14 DIAGNOSIS — I5023 Acute on chronic systolic (congestive) heart failure: Secondary | ICD-10-CM | POA: Diagnosis not present

## 2017-11-14 DIAGNOSIS — I130 Hypertensive heart and chronic kidney disease with heart failure and stage 1 through stage 4 chronic kidney disease, or unspecified chronic kidney disease: Secondary | ICD-10-CM | POA: Diagnosis not present

## 2017-11-14 DIAGNOSIS — R0602 Shortness of breath: Secondary | ICD-10-CM | POA: Diagnosis not present

## 2017-11-14 LAB — CBG MONITORING, ED
GLUCOSE-CAPILLARY: 178 mg/dL — AB (ref 65–99)
Glucose-Capillary: 209 mg/dL — ABNORMAL HIGH (ref 65–99)

## 2017-11-14 LAB — GLUCOSE, CAPILLARY
GLUCOSE-CAPILLARY: 162 mg/dL — AB (ref 65–99)
GLUCOSE-CAPILLARY: 156 mg/dL — AB (ref 65–99)
GLUCOSE-CAPILLARY: 157 mg/dL — AB (ref 65–99)
Glucose-Capillary: 99 mg/dL (ref 65–99)

## 2017-11-14 LAB — BASIC METABOLIC PANEL
ANION GAP: 12 (ref 5–15)
BUN: 38 mg/dL — AB (ref 6–20)
CHLORIDE: 108 mmol/L (ref 101–111)
CO2: 22 mmol/L (ref 22–32)
Calcium: 9.2 mg/dL (ref 8.9–10.3)
Creatinine, Ser: 1.69 mg/dL — ABNORMAL HIGH (ref 0.44–1.00)
GFR calc Af Amer: 30 mL/min — ABNORMAL LOW (ref >60)
GFR, EST NON AFRICAN AMERICAN: 26 mL/min — AB (ref >60)
GLUCOSE: 199 mg/dL — AB (ref 65–99)
POTASSIUM: 3.7 mmol/L (ref 3.5–5.1)
Sodium: 142 mmol/L (ref 135–145)

## 2017-11-14 LAB — PROTIME-INR
INR: 1.78
PROTHROMBIN TIME: 20.5 s — AB (ref 11.4–15.2)

## 2017-11-14 MED ORDER — WARFARIN SODIUM 2 MG PO TABS
4.0000 mg | ORAL_TABLET | Freq: Once | ORAL | Status: AC
  Administered 2017-11-14: 4 mg via ORAL
  Filled 2017-11-14: qty 2

## 2017-11-14 MED ORDER — PNEUMOCOCCAL VAC POLYVALENT 25 MCG/0.5ML IJ INJ
0.5000 mL | INJECTION | INTRAMUSCULAR | Status: AC
  Administered 2017-11-15: 0.5 mL via INTRAMUSCULAR
  Filled 2017-11-14: qty 0.5

## 2017-11-14 MED ORDER — WARFARIN - PHARMACIST DOSING INPATIENT: Freq: Every day | Status: DC

## 2017-11-14 NOTE — Discharge Instructions (Addendum)
1)Follow-up with your primary doctor for repeat  INR and BMP/kidney test on Friday, 11/18/2017 2) very low-salt diet advised 3) take medications exactly as prescribed 4)Avoid ibuprofen/Advil/Aleve/Motrin/Goody Powders/Naproxen/BC powders/Meloxicam/Diclofenac/Indomethacin and other Nonsteroidal anti-inflammatory medications as these will make you more likely to bleed and can cause stomach ulcers, can also cause Kidney problems.  5) weigh yourself daily, call if you gain more than 3 pounds in 1 day or more than 5 pounds in the week as your Lasix/furosemide may have to be adjusted 6) avoid excessive intake, limiting fluid intake to 1.5 L/day (50 ounces per day)   Information on my medicine - Coumadin   (Warfarin)  Why was Coumadin prescribed for you? Coumadin was prescribed for you because you have a blood clot or a medical condition that can cause an increased risk of forming blood clots. Blood clots can cause serious health problems by blocking the flow of blood to the heart, lung, or brain. Coumadin can prevent harmful blood clots from forming. As a reminder your indication for Coumadin is:   Stroke Prevention Because Of Atrial Fibrillation  What test will check on my response to Coumadin? While on Coumadin (warfarin) you will need to have an INR test regularly to ensure that your dose is keeping you in the desired range. The INR (international normalized ratio) number is calculated from the result of the laboratory test called prothrombin time (PT).  If an INR APPOINTMENT HAS NOT ALREADY BEEN MADE FOR YOU please schedule an appointment to have this lab work done by your health care provider within 7 days. Your INR goal is usually a number between:  2 to 3 or your provider may give you a more narrow range like 2-2.5.  Ask your health care provider during an office visit what your goal INR is.  What  do you need to  know  About  COUMADIN? Take Coumadin (warfarin) exactly as prescribed by your  healthcare provider about the same time each day.  DO NOT stop taking without talking to the doctor who prescribed the medication.  Stopping without other blood clot prevention medication to take the place of Coumadin may increase your risk of developing a new clot or stroke.  Get refills before you run out.  What do you do if you miss a dose? If you miss a dose, take it as soon as you remember on the same day then continue your regularly scheduled regimen the next day.  Do not take two doses of Coumadin at the same time.  Important Safety Information A possible side effect of Coumadin (Warfarin) is an increased risk of bleeding. You should call your healthcare provider right away if you experience any of the following: ? Bleeding from an injury or your nose that does not stop. ? Unusual colored urine (red or dark Rund) or unusual colored stools (red or black). ? Unusual bruising for unknown reasons. ? A serious fall or if you hit your head (even if there is no bleeding).  Some foods or medicines interact with Coumadin (warfarin) and might alter your response to warfarin. To help avoid this: ? Eat a balanced diet, maintaining a consistent amount of Vitamin K. ? Notify your provider about major diet changes you plan to make. ? Avoid alcohol or limit your intake to 1 drink for women and 2 drinks for men per day. (1 drink is 5 oz. wine, 12 oz. beer, or 1.5 oz. liquor.)  Make sure that ANY health care provider who prescribes medication  for you knows that you are taking Coumadin (warfarin).  Also make sure the healthcare provider who is monitoring your Coumadin knows when you have started a new medication including herbals and non-prescription products.  Coumadin (Warfarin)  Major Drug Interactions  Increased Warfarin Effect Decreased Warfarin Effect  Alcohol (large quantities) Antibiotics (esp. Septra/Bactrim, Flagyl, Cipro) Amiodarone (Cordarone) Aspirin (ASA) Cimetidine  (Tagamet) Megestrol (Megace) NSAIDs (ibuprofen, naproxen, etc.) Piroxicam (Feldene) Propafenone (Rythmol SR) Propranolol (Inderal) Isoniazid (INH) Posaconazole (Noxafil) Barbiturates (Phenobarbital) Carbamazepine (Tegretol) Chlordiazepoxide (Librium) Cholestyramine (Questran) Griseofulvin Oral Contraceptives Rifampin Sucralfate (Carafate) Vitamin K   Coumadin (Warfarin) Major Herbal Interactions  Increased Warfarin Effect Decreased Warfarin Effect  Garlic Ginseng Ginkgo biloba Coenzyme Q10 Green tea St. Johns wort    Coumadin (Warfarin) FOOD Interactions  Eat a consistent number of servings per week of foods HIGH in Vitamin K (1 serving =  cup)  Collards (cooked, or boiled & drained) Kale (cooked, or boiled & drained) Mustard greens (cooked, or boiled & drained) Parsley *serving size only =  cup Spinach (cooked, or boiled & drained) Swiss chard (cooked, or boiled & drained) Turnip greens (cooked, or boiled & drained)  Eat a consistent number of servings per week of foods MEDIUM-HIGH in Vitamin K (1 serving = 1 cup)  Asparagus (cooked, or boiled & drained) Broccoli (cooked, boiled & drained, or raw & chopped) Brussel sprouts (cooked, or boiled & drained) *serving size only =  cup Lettuce, raw (green leaf, endive, romaine) Spinach, raw Turnip greens, raw & chopped   These websites have more information on Coumadin (warfarin):  FailFactory.se; VeganReport.com.au;  1)Follow-up with your primary doctor for repeat  INR and BMP/kidney test on Friday, 11/18/2017 2) very low-salt diet advised 3) take medications exactly as prescribed 4)Avoid ibuprofen/Advil/Aleve/Motrin/Goody Powders/Naproxen/BC powders/Meloxicam/Diclofenac/Indomethacin and other Nonsteroidal anti-inflammatory medications as these will make you more likely to bleed and can cause stomach ulcers, can also cause Kidney problems.  5) weigh yourself daily, call if you gain more than 3  pounds in 1 day or more than 5 pounds in the week as your Lasix/furosemide may have to be adjusted 6) avoid excessive intake, limiting fluid intake to 1.5 L/day (50 ounces per day)

## 2017-11-14 NOTE — Progress Notes (Signed)
ANTICOAGULATION CONSULT NOTE - Initial Consult  Pharmacy Consult for warfarin Indication: Atrial fibrilation  No Known Allergies  Patient Measurements: Height: 5\' 1"  (154.9 cm) Weight: 149 lb (67.6 kg) IBW/kg (Calculated) : 47.8   Vital Signs: BP: 133/101 (05/27 0749) Pulse Rate: 87 (05/27 0749)  Labs: Recent Labs    11/13/17 1609 11/13/17 1913 11/14/17 0308  HGB 13.1  --   --   HCT 39.9  --   --   PLT 163  --   --   LABPROT  --  21.1* 20.5*  INR  --  1.84 1.78  CREATININE 1.63*  --  1.69*    Estimated Creatinine Clearance: 21 mL/min (A) (by C-G formula based on SCr of 1.69 mg/dL (H)).   Medical History: Past Medical History:  Diagnosis Date  . Atrial fibrillation ([place])    a. 03/2013 s/p TEE/DCCV;  b. 03/2013 Eliquis initiated.  . Chronic systolic CHF (congestive heart failure) ([place])    a. 03/2013 Echo: EF 20-25%, diff HK, mild to mod MR, sev dil LA, mild RV dysfxn, sev dil RA, mod TR.  . Claudication ([place])   . Glaucoma   . High cholesterol   . Hypertension   . Type II diabetes mellitus ([facility])       Assessment: 84 yo female on warfarin 4mg  x 2 days, then 2mg  x 2 days and repeat PTA for Afib. INR mostly stable at 1.78. Last dose 5/26 in Am. Hgb 13.1, WBC wnl.   Goal of Therapy:  INR 2-3 Monitor platelets by anticoagulation protocol: Yes   Plan:  Give Coumadin 4mg  PO x 1 tonight Monitor daily INR, CBC, s/s of bleed  Elenor Quinones, PharmD, [facility] Clinical Pharmacist Pager [phone] 11/14/2017 8:36 AM

## 2017-11-14 NOTE — ED Notes (Signed)
Report called to [facility], pt to be transported via wheelchair - 3E staff to transport pt - pt made aware of same

## 2017-11-14 NOTE — ED Notes (Signed)
Silvadene cream applied to 2 ulcers to posterior aspect of rgt calf area; 4x4 placed over both then wrapped with Kerlix; pt tolerated procedure well

## 2017-11-14 NOTE — ED Notes (Signed)
Patient denies pain and is resting comfortably. Awake, alert, oriented

## 2017-11-14 NOTE — ED Notes (Signed)
Pt transferred from ED to [facility] room 9 - care assumed at this time - resting quietly on stretcher - no complaints, awake, alert, pleasant, conversant; ccm showing atrial fib rate b/w 113-130; breakfast at bedside; however, pt declines meal at this time

## 2017-11-14 NOTE — Plan of Care (Signed)
  Problem: Education: Goal: Knowledge of General Education information will improve Outcome: Progressing   Problem: Elimination: Goal: Will not experience complications related to urinary retention Outcome: Progressing

## 2017-11-14 NOTE — ED Notes (Signed)
Family at bedside. 

## 2017-11-14 NOTE — Progress Notes (Signed)
Patient Demographics:    Patricia Randall, is a 84 y.o. female, DOB - [birth year], YSA:[id number]  Admit date - 11/13/2017   Admitting Physician Vianne Bulls, MD  Outpatient Primary MD for the patient is Leonard Downing, MD  LOS - 1   Chief Complaint  Patient presents with  . Shortness of Breath        Subjective:    Patricia Randall today has no fevers, no emesis,  No chest pain, shortness of breath is better, voiding well  Assessment  & Plan :    Principal Problem:   Acute on chronic systolic CHF (congestive heart failure) (HCC) Active Problems:   Permanent atrial fibrillation (HCC)   CKD (chronic kidney disease), stage III (HCC)   Type II diabetes mellitus (HCC)   Ulcer of right lower extremity [facility])  Brief summary 84 y.o. female with medical history significant for chronic systolic CHF, atrial fibrillation on Coumadin, chronic kidney disease stage III, type 2 diabetes mellitus, and right leg ulcer admitted on 11/13/2017 with acute on chronic systolic CHF exacerbation  Plan:- 1)HFrEF- Acute on chronic systolic CHF -subjectively patient feels better with IV diuresis, last known EF is 30 to 35% echo did show moderate MR and TR, continue IV Lasix 40 mg every 12 hours, Coreg, fluid restriction, daily weight and fluid balance, and electrolyte monitoring  2) chronic atrial Fibrillation - -  stable at this time, continue Coreg for rate control,, continue Coumadin for anticoagulation, - CHADS-VASc at least 5 (age x2, gender, CHF, DM)  3) CKD stage III - - SCr is 1.6, which is close to patient's baseline ,  - Renally-dose medications, avoid nephrotoxins, daily chem panel during diuresis    4) Type II DM - - A1c was 7.6% last month , stable, glimepiride on hold, continue sliding scale coveraged   5. Right leg ulcer - - Following with wound care outpt , - No surrounding erythema, purulent drainage, or  systemic features to suggest active infection  , - Continue Silvadene cream/wound care     Code Status : full   Disposition Plan  : home  DVT Prophylaxis  :  Coumadin  Lab Results  Component Value Date   PLT 163 11/13/2017    Inpatient Medications  Scheduled Meds: . carvedilol  9.375 mg Oral BID WC  . cholecalciferol  1,000 Units Oral Daily  . feeding supplement (GLUCERNA SHAKE)  237 mL Oral Q24H  . furosemide  40 mg Intravenous Q12H  . insulin aspart  0-5 Units Subcutaneous QHS  . insulin aspart  0-9 Units Subcutaneous TID WC  . latanoprost  1 drop Both Eyes QHS  . [START ON 11/15/2017] pneumococcal 23 valent vaccine  0.5 mL Intramuscular Tomorrow-1000  . pravastatin  40 mg Oral q1800  . silver sulfADIAZINE   Topical Daily  . sodium chloride flush  3 mL Intravenous Q12H  . warfarin  4 mg Oral ONCE-1800  . Warfarin - Pharmacist Dosing Inpatient   Does not apply q1800   Continuous Infusions: . sodium chloride     PRN Meds:.sodium chloride, acetaminophen, hydrALAZINE, ondansetron (ZOFRAN) IV, polyethylene glycol, sodium chloride flush    Anti-infectives (From admission, onward)   None        Objective:  Vitals:   11/14/17 1000 11/14/17 1045 11/14/17 1100 11/14/17 1149  BP: 102/62 111/87 111/79 119/87  Pulse: (!) 49 (!) 45 92 84  Resp: 15 11 17 18   Temp:    97.8 F (36.6 C)  TempSrc:    Oral  SpO2: 97% 93% 97% 100%  Weight:    67.4 kg (148 lb 9.4 oz)  Height:    5\' 1"  (1.549 m)    Wt Readings from Last 3 Encounters:  11/14/17 67.4 kg (148 lb 9.4 oz)  10/25/17 68.5 kg (151 lb)  09/30/17 61.7 kg (136 lb)    No intake or output data in the 24 hours ending 11/14/17 1624   Physical Exam  Gen:- Awake Alert, in no acute distress, able to speak in sentences HEENT:- [place].AT, No sclera icterus Neck-Supple Neck,No JVD,.  Lungs-diminished in bases with few scattered rales CV- S1, S2 normal, irregularly irregular Abd-  +ve B.Sounds, Abd Soft, No tenderness,      Extremity/Skin:-Good pulses, warm and  dry Psych-affect is appropriate, oriented x3 Neuro-no new focal deficits, no tremors   Data Review:   Micro Results No results found for this or any previous visit (from the past 240 hour(s)).  Radiology Reports Dg Chest 2 View  Result Date: 11/13/2017 CLINICAL DATA:  Shortness of breath for 2-3 weeks. EXAM: CHEST - 2 VIEW COMPARISON:  09/26/2017 FINDINGS: Enlarged cardiac silhouette, not significantly changed. Mediastinal contours appear intact. Calcific atherosclerotic disease of the aorta. There is no evidence of focal airspace consolidation, pleural effusion or pneumothorax. Interstitial pulmonary edema. Osseous structures are without acute abnormality. Soft tissues are grossly normal. IMPRESSION: Enlarged cardiac silhouette which may be due to congestive heart failure or pericardial effusion. Interstitial pulmonary edema. Calcific atherosclerotic disease and tortuosity of the aorta. Electronically Signed   By: Fidela Salisbury M.D.   On: 11/13/2017 16:34     CBC Recent Labs  Lab 11/13/17 1609  WBC 4.0  HGB 13.1  HCT 39.9  PLT 163  MCV 83.3  MCH 27.3  MCHC 32.8  RDW 19.2*    Chemistries  Recent Labs  Lab 11/13/17 1609 11/13/17 1913 11/14/17 0308  NA 143  --  142  K 3.6  --  3.7  CL 111  --  108  CO2 23  --  22  GLUCOSE 74  --  199*  BUN 37*  --  38*  CREATININE 1.63*  --  1.69*  CALCIUM 8.7*  --  9.2  MG  --  1.6*  --    ------------------------------------------------------------------------------------------------------------------ No results for input(s): CHOL, HDL, LDLCALC, TRIG, CHOLHDL, LDLDIRECT in the last 72 hours.  Lab Results  Component Value Date   HGBA1C 7.6 (H) 09/27/2017   ------------------------------------------------------------------------------------------------------------------ No results for input(s): TSH, T4TOTAL, T3FREE, THYROIDAB in the last 72 hours.  Invalid input(s):  FREET3 ------------------------------------------------------------------------------------------------------------------ No results for input(s): VITAMINB12, FOLATE, FERRITIN, TIBC, IRON, RETICCTPCT in the last 72 hours.  Coagulation profile Recent Labs  Lab 11/13/17 1913 11/14/17 0308  INR 1.84 1.78    No results for input(s): DDIMER in the last 72 hours.  Cardiac Enzymes No results for input(s): CKMB, TROPONINI, MYOGLOBIN in the last 168 hours.  Invalid input(s): CK ------------------------------------------------------------------------------------------------------------------    Component Value Date/Time   BNP 585.3 (H) 09/26/2017 1143     Chianne Byrns M.D on 11/14/2017 at 4:24 PM  Between 7am to 7pm - Pager - [phone]  After 7pm go to [email or web address] - password [facility]  [facility] -  Office  [phone]  Voice Recognition Viviann Spare dictation system was used to create this note, attempts have been made to correct errors. Please contact the author with questions and/or clarifications.

## 2017-11-15 DIAGNOSIS — I5023 Acute on chronic systolic (congestive) heart failure: Secondary | ICD-10-CM | POA: Diagnosis not present

## 2017-11-15 DIAGNOSIS — I130 Hypertensive heart and chronic kidney disease with heart failure and stage 1 through stage 4 chronic kidney disease, or unspecified chronic kidney disease: Secondary | ICD-10-CM | POA: Diagnosis not present

## 2017-11-15 DIAGNOSIS — Z23 Encounter for immunization: Secondary | ICD-10-CM | POA: Diagnosis not present

## 2017-11-15 DIAGNOSIS — R0602 Shortness of breath: Secondary | ICD-10-CM | POA: Diagnosis present

## 2017-11-15 LAB — PROTIME-INR
INR: 1.81
Prothrombin Time: 20.8 seconds — ABNORMAL HIGH (ref 11.4–15.2)

## 2017-11-15 LAB — BASIC METABOLIC PANEL
Anion gap: 10 (ref 5–15)
BUN: 39 mg/dL — AB (ref 6–20)
CALCIUM: 9.3 mg/dL (ref 8.9–10.3)
CO2: 29 mmol/L (ref 22–32)
CREATININE: 2 mg/dL — AB (ref 0.44–1.00)
Chloride: 106 mmol/L (ref 101–111)
GFR calc Af Amer: 25 mL/min — ABNORMAL LOW (ref >60)
GFR calc non Af Amer: 21 mL/min — ABNORMAL LOW (ref >60)
GLUCOSE: 128 mg/dL — AB (ref 65–99)
Potassium: 3.5 mmol/L (ref 3.5–5.1)
Sodium: 145 mmol/L (ref 135–145)

## 2017-11-15 LAB — GLUCOSE, CAPILLARY
GLUCOSE-CAPILLARY: 156 mg/dL — AB (ref 65–99)
Glucose-Capillary: 130 mg/dL — ABNORMAL HIGH (ref 65–99)

## 2017-11-15 MED ORDER — FUROSEMIDE 20 MG PO TABS: 20.0000 mg | ORAL_TABLET | Freq: Two times a day (BID) | ORAL | 5 refills | Status: DC

## 2017-11-15 MED ORDER — GLIMEPIRIDE 4 MG PO TABS: 4.0000 mg | ORAL_TABLET | Freq: Every day | ORAL | 2 refills | Status: DC

## 2017-11-15 MED ORDER — CARVEDILOL 3.125 MG PO TABS: 9.3750 mg | ORAL_TABLET | Freq: Two times a day (BID) | ORAL | 3 refills | Status: DC

## 2017-11-15 MED ORDER — LOVASTATIN 40 MG PO TABS: 40.0000 mg | ORAL_TABLET | Freq: Every day | ORAL | 5 refills | Status: AC

## 2017-11-15 NOTE — Care Management CC44 (Signed)
Condition Code 44 Documentation Completed  Patient Details  Name: Patricia Randall MRN: [id number] Date of Birth: [birth year]   Condition Code 44 given:  Yes Patient signature on Condition Code 44 notice:  Yes Documentation of 2 MD's agreement:  Yes Code 44 added to claim:  Yes    Maryclare Labrador, RN 11/15/2017, 12:20 PM

## 2017-11-15 NOTE — Care Management Note (Addendum)
Case Management Note  Patient Details  Name: Patricia Randall MRN: [id number] Date of Birth: 03/03/1929  Subjective/Objective:       Pt admitted with SOB             Action/Plan:   PTA independent from home with son.  Pt educated on daily weights and low salt diet adherence. Pt declined PT eval.  Pt has PCP and denied barriers to obtaining/paying for medications as prescribed.  Pt recently started going to wound center.  Pt will transport home via private vheicle   Expected Discharge Date:  11/15/17               Expected Discharge Plan:  Home/Self Care  In-House Referral:     Discharge planning Services  CM Consult  Post Acute Care Choice:    Choice offered to:     DME Arranged:    DME Agency:  [place]:  RN [facility] Agency:     Status of Service:  In process, will continue to follow  If discussed at Long Length of Stay Meetings, dates discussed:    Additional Comments: Pt already active with [facility] HHR - Resumption orders requested.  CM also made [facility] aware that pt is discharging home today Maryclare Labrador, RN 11/15/2017, 2:20 PM

## 2017-11-15 NOTE — Progress Notes (Signed)
Pt maintained 100% on room air while ambulating. Will continue to monitor.

## 2017-11-15 NOTE — Care Management Obs Status (Signed)
[place] NOTIFICATION   Patient Details  Name: Patricia Randall MRN: [id number] Date of Birth: 25-Aug-1928   Medicare Observation Status Notification Given:  Yes    Maryclare Labrador, RN 11/15/2017, 12:20 PM

## 2017-11-15 NOTE — Discharge Summary (Signed)
Patricia Randall, is a 84 y.o. female  DOB October 09, 1928  MRN [id number].  Admission date:  11/13/2017  Admitting Physician  Vianne Bulls, MD  Discharge Date:  11/15/2017   Primary MD  Leonard Downing, MD  Recommendations for primary care physician for things to follow:   1)Follow-up with your primary doctor for repeat  INR and BMP/kidney test on Friday, 11/18/2017 2) very low-salt diet advised 3) take medications exactly as prescribed 4)Avoid ibuprofen/Advil/Aleve/Motrin/Goody Powders/Naproxen/BC powders/Meloxicam/Diclofenac/Indomethacin and other Nonsteroidal anti-inflammatory medications as these will make you more likely to bleed and can cause stomach ulcers, can also cause Kidney problems.  5) weigh yourself daily, call if you gain more than 3 pounds in 1 day or more than 5 pounds in the week as your Lasix/furosemide may have to be adjusted 6) avoid excessive intake, limiting fluid intake to 1.5 L/day (50 ounces per day)  Admission Diagnosis  Acute combined systolic and diastolic congestive heart failure ([place]) [I50.41] Leg wound, left, initial encounter [S81.802A]   Discharge Diagnosis  Acute combined systolic and diastolic congestive heart failure ([place]) [I50.41] Leg wound, left, initial encounter [S81.802A]    Principal Problem:   Acute on chronic systolic CHF (congestive heart failure) (HCC) Active Problems:   Permanent atrial fibrillation (HCC)   CKD (chronic kidney disease), stage III (HCC)   Type II diabetes mellitus ([place])   Ulcer of right lower extremity ([place])      Past Medical History:  Diagnosis Date  . Atrial fibrillation ([place])    a. 03/2013 s/p TEE/DCCV;  b. 03/2013 Eliquis initiated.  . Chronic systolic CHF (congestive heart failure) ([place])    a. 03/2013 Echo: EF 20-25%, diff HK, mild to mod MR, sev dil LA, mild RV dysfxn, sev dil RA, mod TR.  . Claudication ([place])   . Glaucoma   .  High cholesterol   . Hypertension   . Type II diabetes mellitus ([place])     Past Surgical History:  Procedure Laterality Date  . CARDIOVERSION N/A 04/02/2013   Procedure: CARDIOVERSION;  Surgeon: Thayer Headings, MD;  Location: [place];  Service: Cardiovascular;  Laterality: N/A;  Spoke with Tom   . ESOPHAGOGASTRODUODENOSCOPY N/A 05/26/2016   Procedure: ESOPHAGOGASTRODUODENOSCOPY (EGD);  Surgeon: Ladene Artist, MD;  Location: [facility] ENDOSCOPY;  Service: Endoscopy;  Laterality: N/A;  . EYE SURGERY Right    "had blood in it"   . TEE WITHOUT CARDIOVERSION N/A 04/02/2013   Procedure: TRANSESOPHAGEAL ECHOCARDIOGRAM (TEE);  Surgeon: Thayer Headings, MD;  Location: [facility] ENDOSCOPY;  Service: Cardiovascular;  Laterality: N/A;     HPI  from the history and physical done on the day of admission:    Chief Complaint: SOB, orthopnea   HPI: Patricia Randall is a 84 y.o. female with medical history significant for chronic systolic CHF, atrial fibrillation on Coumadin, chronic kidney disease stage III, type 2 diabetes mellitus, and right leg ulcer, now presenting to the emergency department with 2 weeks of progressive shortness of breath and orthopnea.  Patient was admitted to  the hospital  approximately 6 weeks ago for acute on chronic CHF with acute kidney injury and right lower extremity cellulitis, have been doing quite well at home following the discharge until she noted the insidious development of dyspnea and orthopnea over the past 2 weeks.  She denies chest pain, palpitations, fevers, or chills.  Reports continued adherence with her medications.  ED Course: Upon arrival to the ED, patient is found to beafebrile, saturating adequately on room air, hypertensive to 150/100, and vitals otherwise normal.  EKG features atrial fibrillation with rate 109, LAD, incomplete RBBB, and nonspecific ST-T abnormality.  Chest x-ray is notable for enlarged cardiac silhouette with interstitial edema.  Chemistry panel is  notable for creatinine 1.63, consistent with her apparent baseline.  CBC is unremarkable and troponin is negative.  Patient was given IV Lasix in the ED, remains mildly hypertensive and slightly tachycardic, and will be admitted for ongoing evaluation       Hospital Course:     Brief summary 84 y.o.femalewith medical history significant forchronic systolic CHF, atrial fibrillation on Coumadin, chronic kidney disease stage III, type 2 diabetes mellitus, and right leg ulcer admitted on 11/13/2017 with acute on chronic systolic CHF exacerbation  Plan:- 1)HFrEF-Acute on chronic systolic CHF- overall patient feels much better after IV diuresis, last known EF is 30 to 35% echo did show moderate MR and TR,  okay to discharge home on p.o. Lasix, Coreg, fluid restriction, daily weight , follow-up with PCP for repeat labs on 11/18/2017,  2) chronic atrial Fibrillation- - stable at this time, continue Coreg for rate control,, continue Coumadin for anticoagulation, -CHADS-VASc at least 5 (age x2, gender, CHF, DM) , repeat INR with PCP on 11/18/2017  3)CKD stage III- -creatinine is 2.0, patient is advised to repeat BMP with PCP on 11/18/2017, , avoid nephrotoxins,   4)Type II DM- -A1c was 7.6% last month, stable, resume home meds  5.Right leg ulcer- -Following with wound care outpt, -No surrounding erythema, purulent drainage, or systemic features to suggest active infection, -Continue Silvadene cream/wound care   Discharge Condition: stable  Follow UP  Diet and Activity recommendation:  As advised  Discharge Instructions     Discharge Instructions    (HEART FAILURE PATIENTS) Call MD:  Anytime you have any of the following symptoms: 1) 3 pound weight gain in 24 hours or 5 pounds in 1 week 2) shortness of breath, with or without a dry hacking cough 3) swelling in the hands, feet or stomach 4) if you have to sleep on extra pillows at night in order to breathe.   Complete  by:  As directed    AMB referral to CHF clinic   Complete by:  As directed    Call MD for:  difficulty breathing, headache or visual disturbances   Complete by:  As directed    Call MD for:  persistant dizziness or light-headedness   Complete by:  As directed    Call MD for:  persistant nausea and vomiting   Complete by:  As directed    Call MD for:  severe uncontrolled pain   Complete by:  As directed    Call MD for:  temperature >100.4   Complete by:  As directed    Diet - low sodium heart healthy   Complete by:  As directed    Diet Carb Modified   Complete by:  As directed    Discharge instructions   Complete by:  As directed    1)Follow-up with  your primary doctor for repeat  INR and BMP/kidney test on Friday, 11/18/2017 2) very low-salt diet advised 3) take medications exactly as prescribed 4)Avoid ibuprofen/Advil/Aleve/Motrin/Goody Powders/Naproxen/BC powders/Meloxicam/Diclofenac/Indomethacin and other Nonsteroidal anti-inflammatory medications as these will make you more likely to bleed and can cause stomach ulcers, can also cause Kidney problems.  5) weigh yourself daily, call if you gain more than 3 pounds in 1 day or more than 5 pounds in the week as your Lasix/furosemide may have to be adjusted 6) avoid excessive intake, limiting fluid intake to 1.5 L/day (50 ounces per day)   Increase activity slowly   Complete by:  As directed         Discharge Medications     Allergies as of 11/15/2017   No Known Allergies     Medication List    TAKE these medications   carvedilol 3.125 MG tablet Commonly known as:  COREG Take 3 tablets (9.375 mg total) by mouth 2 (two) times daily with a meal. What changed:    medication strength  See the new instructions.   cholecalciferol 1000 units tablet Commonly known as:  VITAMIN D Take 1,000 Units by mouth daily.   feeding supplement (GLUCERNA SHAKE) Liqd Take 237 mLs by mouth daily.   furosemide 20 MG tablet Commonly  known as:  LASIX Take 1 tablet (20 mg total) by mouth 2 (two) times daily at 10 am and 4 pm. What changed:  when to take this   glimepiride 4 MG tablet Commonly known as:  AMARYL Take 1 tablet (4 mg total) by mouth daily before breakfast.   HAIR/SKIN/NAILS/BIOTIN PO Take 1 tablet by mouth daily.   latanoprost 0.005 % ophthalmic solution Commonly known as:  XALATAN Place 1 drop into both eyes at bedtime.   lovastatin 40 MG tablet Commonly known as:  MEVACOR Take 1 tablet (40 mg total) by mouth at bedtime.   polyethylene glycol packet Commonly known as:  MIRALAX / GLYCOLAX Take 17 g by mouth daily as needed for mild constipation.   silver sulfADIAZINE 1 % cream Commonly known as:  SILVADENE Apply to affected area daily   warfarin 2 MG tablet Commonly known as:  COUMADIN Take 1 tablet (2 mg total) by mouth daily. What changed:    how much to take  when to take this  additional instructions       Major procedures and Radiology Reports - PLEASE review detailed and final reports for all details, in brief -    Dg Chest 2 View  Result Date: 11/13/2017 CLINICAL DATA:  Shortness of breath for 2-3 weeks. EXAM: CHEST - 2 VIEW COMPARISON:  09/26/2017 FINDINGS: Enlarged cardiac silhouette, not significantly changed. Mediastinal contours appear intact. Calcific atherosclerotic disease of the aorta. There is no evidence of focal airspace consolidation, pleural effusion or pneumothorax. Interstitial pulmonary edema. Osseous structures are without acute abnormality. Soft tissues are grossly normal. IMPRESSION: Enlarged cardiac silhouette which may be due to congestive heart failure or pericardial effusion. Interstitial pulmonary edema. Calcific atherosclerotic disease and tortuosity of the aorta. Electronically Signed   By: Fidela Salisbury M.D.   On: 11/13/2017 16:34    Micro Results   No results found for this or any previous visit (from the past 240 hour(s)).     Today    Subjective    Patricia Randall today has no new complaints, breathing much better, no shortness of breath at rest, no significant dyspnea on exertion          Patient  has been seen and examined prior to discharge   Objective   Blood pressure 124/80, pulse 80, temperature (!) 97.5 F (36.4 C), temperature source Oral, resp. rate 18, height 5\' 1"  (1.549 m), weight 66.7 kg (147 lb), SpO2 100 %.   Intake/Output Summary (Last 24 hours) at 11/15/2017 1223 Last data filed at 11/15/2017 1000 Gross per 24 hour  Intake 380 ml  Output 250 ml  Net 130 ml    Exam Gen:- Awake Alert, in no acute distress, able to speak in complete sentences HEENT:- [place].AT, No sclera icterus Neck-Supple Neck,No JVD,.  Lungs-improved air movement, no wheezing or rales CV- S1, S2 normal, irregularly irregular Abd-  +ve B.Sounds, Abd Soft, No tenderness,    Extremity/Skin:-Good pulses, warm and  dry Psych-affect is appropriate, oriented x3 Neuro-no new focal deficits, no tremors     Data Review   CBC w Diff:  Lab Results  Component Value Date   WBC 4.0 11/13/2017   HGB 13.1 11/13/2017   HCT 39.9 11/13/2017   PLT 163 11/13/2017   LYMPHOPCT 46 09/30/2017   MONOPCT 6 09/30/2017   EOSPCT 3 09/30/2017   BASOPCT 1 09/30/2017    CMP:  Lab Results  Component Value Date   NA 145 11/15/2017   K 3.5 11/15/2017   CL 106 11/15/2017   CO2 29 11/15/2017   BUN 39 (H) 11/15/2017   CREATININE 2.00 (H) 11/15/2017   PROT 6.4 (L) 09/26/2017   ALBUMIN 3.1 (L) 09/26/2017   BILITOT 0.5 09/26/2017   ALKPHOS 74 09/26/2017   AST 23 09/26/2017   ALT 21 09/26/2017  .   Total Discharge time is about 33 minutes  Roxan Hockey M.D on 11/15/2017 at 12:23 PM  [facility]   Office  [phone]  Voice Recognition Viviann Spare dictation system was used to create this note, attempts have been made to correct errors. Please contact the author with questions and/or clarifications.

## 2017-11-20 ENCOUNTER — Other Ambulatory Visit: Payer: Self-pay

## 2017-11-20 ENCOUNTER — Encounter (HOSPITAL_COMMUNITY): Payer: Self-pay | Admitting: Emergency Medicine

## 2017-11-20 ENCOUNTER — Emergency Department (HOSPITAL_COMMUNITY): Payer: Medicare Other

## 2017-11-20 ENCOUNTER — Observation Stay (HOSPITAL_COMMUNITY)
Admission: EM | Admit: 2017-11-20 | Discharge: 2017-11-25 | Disposition: A | Discharge status: Home / Self Care | Payer: Medicare Other | Attending: Internal Medicine | Admitting: Internal Medicine

## 2017-11-20 DIAGNOSIS — E119 Type 2 diabetes mellitus without complications: Secondary | ICD-10-CM

## 2017-11-20 DIAGNOSIS — I998 Other disorder of circulatory system: Secondary | ICD-10-CM

## 2017-11-20 DIAGNOSIS — Z7901 Long term (current) use of anticoagulants: Secondary | ICD-10-CM | POA: Diagnosis not present

## 2017-11-20 DIAGNOSIS — I130 Hypertensive heart and chronic kidney disease with heart failure and stage 1 through stage 4 chronic kidney disease, or unspecified chronic kidney disease: Secondary | ICD-10-CM | POA: Insufficient documentation

## 2017-11-20 DIAGNOSIS — I4821 Permanent atrial fibrillation: Secondary | ICD-10-CM

## 2017-11-20 DIAGNOSIS — I509 Heart failure, unspecified: Secondary | ICD-10-CM

## 2017-11-20 DIAGNOSIS — I429 Cardiomyopathy, unspecified: Secondary | ICD-10-CM | POA: Insufficient documentation

## 2017-11-20 DIAGNOSIS — E11622 Type 2 diabetes mellitus with other skin ulcer: Secondary | ICD-10-CM | POA: Diagnosis not present

## 2017-11-20 DIAGNOSIS — M6281 Muscle weakness (generalized): Secondary | ICD-10-CM | POA: Diagnosis not present

## 2017-11-20 DIAGNOSIS — N183 Chronic kidney disease, stage 3 unspecified: Secondary | ICD-10-CM | POA: Diagnosis present

## 2017-11-20 DIAGNOSIS — H409 Unspecified glaucoma: Secondary | ICD-10-CM | POA: Diagnosis not present

## 2017-11-20 DIAGNOSIS — I5043 Acute on chronic combined systolic (congestive) and diastolic (congestive) heart failure: Secondary | ICD-10-CM | POA: Insufficient documentation

## 2017-11-20 DIAGNOSIS — E1122 Type 2 diabetes mellitus with diabetic chronic kidney disease: Secondary | ICD-10-CM | POA: Diagnosis not present

## 2017-11-20 DIAGNOSIS — Z7984 Long term (current) use of oral hypoglycemic drugs: Secondary | ICD-10-CM | POA: Insufficient documentation

## 2017-11-20 DIAGNOSIS — L97919 Non-pressure chronic ulcer of unspecified part of right lower leg with unspecified severity: Secondary | ICD-10-CM | POA: Diagnosis present

## 2017-11-20 DIAGNOSIS — E1151 Type 2 diabetes mellitus with diabetic peripheral angiopathy without gangrene: Secondary | ICD-10-CM | POA: Diagnosis not present

## 2017-11-20 DIAGNOSIS — E7800 Pure hypercholesterolemia, unspecified: Secondary | ICD-10-CM | POA: Insufficient documentation

## 2017-11-20 DIAGNOSIS — E43 Unspecified severe protein-calorie malnutrition: Secondary | ICD-10-CM | POA: Diagnosis not present

## 2017-11-20 DIAGNOSIS — I10 Essential (primary) hypertension: Secondary | ICD-10-CM

## 2017-11-20 DIAGNOSIS — Z6828 Body mass index (BMI) 28.0-28.9, adult: Secondary | ICD-10-CM | POA: Diagnosis not present

## 2017-11-20 DIAGNOSIS — I482 Chronic atrial fibrillation: Secondary | ICD-10-CM | POA: Diagnosis not present

## 2017-11-20 DIAGNOSIS — L97219 Non-pressure chronic ulcer of right calf with unspecified severity: Secondary | ICD-10-CM | POA: Insufficient documentation

## 2017-11-20 DIAGNOSIS — I5023 Acute on chronic systolic (congestive) heart failure: Secondary | ICD-10-CM | POA: Diagnosis present

## 2017-11-20 LAB — PROTIME-INR
INR: 1.82
PROTHROMBIN TIME: 20.9 s — AB (ref 11.4–15.2)

## 2017-11-20 LAB — CBC
HEMATOCRIT: 40 % (ref 36.0–46.0)
HEMOGLOBIN: 12.9 g/dL (ref 12.0–15.0)
MCH: 27 pg (ref 26.0–34.0)
MCHC: 32.3 g/dL (ref 30.0–36.0)
MCV: 83.7 fL (ref 78.0–100.0)
Platelets: 156 10*3/uL (ref 150–400)
RBC: 4.78 MIL/uL (ref 3.87–5.11)
RDW: 18.6 % — ABNORMAL HIGH (ref 11.5–15.5)
WBC: 3.8 10*3/uL — AB (ref 4.0–10.5)

## 2017-11-20 LAB — BRAIN NATRIURETIC PEPTIDE: B NATRIURETIC PEPTIDE 5: 1151.7 pg/mL — AB (ref 0.0–100.0)

## 2017-11-20 LAB — BASIC METABOLIC PANEL
Anion gap: 9 (ref 5–15)
BUN: 47 mg/dL — ABNORMAL HIGH (ref 6–20)
CHLORIDE: 108 mmol/L (ref 101–111)
CO2: 22 mmol/L (ref 22–32)
Calcium: 8.6 mg/dL — ABNORMAL LOW (ref 8.9–10.3)
Creatinine, Ser: 1.87 mg/dL — ABNORMAL HIGH (ref 0.44–1.00)
GFR calc non Af Amer: 23 mL/min — ABNORMAL LOW (ref >60)
GFR, EST AFRICAN AMERICAN: 27 mL/min — AB (ref >60)
Glucose, Bld: 171 mg/dL — ABNORMAL HIGH (ref 65–99)
POTASSIUM: 3.9 mmol/L (ref 3.5–5.1)
SODIUM: 139 mmol/L (ref 135–145)

## 2017-11-20 LAB — I-STAT TROPONIN, ED: Troponin i, poc: 0.01 ng/mL (ref 0.00–0.08)

## 2017-11-20 LAB — GLUCOSE, CAPILLARY
GLUCOSE-CAPILLARY: 86 mg/dL (ref 65–99)
Glucose-Capillary: 155 mg/dL — ABNORMAL HIGH (ref 65–99)

## 2017-11-20 MED ORDER — LATANOPROST 0.005 % OP SOLN
1.0000 [drp] | Freq: Every day | OPHTHALMIC | Status: DC
  Administered 2017-11-20 – 2017-11-24 (×5): 1 [drp] via OPHTHALMIC
  Filled 2017-11-20: qty 2.5

## 2017-11-20 MED ORDER — WARFARIN SODIUM 2 MG PO TABS
4.0000 mg | ORAL_TABLET | Freq: Once | ORAL | Status: AC
  Administered 2017-11-20: 4 mg via ORAL
  Filled 2017-11-20: qty 2

## 2017-11-20 MED ORDER — VITAMIN D 1000 UNITS PO TABS
1000.0000 [IU] | ORAL_TABLET | Freq: Every day | ORAL | Status: DC
  Administered 2017-11-20 – 2017-11-25 (×6): 1000 [IU] via ORAL
  Filled 2017-11-20 (×6): qty 1

## 2017-11-20 MED ORDER — CARVEDILOL 6.25 MG PO TABS
9.3750 mg | ORAL_TABLET | Freq: Two times a day (BID) | ORAL | Status: DC
  Administered 2017-11-20 – 2017-11-22 (×4): 9.375 mg via ORAL
  Filled 2017-11-20 (×5): qty 1

## 2017-11-20 MED ORDER — ONDANSETRON HCL 4 MG/2ML IJ SOLN: 4.0000 mg | Freq: Four times a day (QID) | INTRAMUSCULAR | Status: DC | PRN

## 2017-11-20 MED ORDER — ACETAMINOPHEN 650 MG RE SUPP: 650.0000 mg | Freq: Four times a day (QID) | RECTAL | Status: DC | PRN

## 2017-11-20 MED ORDER — GLUCERNA SHAKE PO LIQD
237.0000 mL | ORAL | Status: DC
  Administered 2017-11-22 – 2017-11-24 (×3): 237 mL via ORAL

## 2017-11-20 MED ORDER — FUROSEMIDE 10 MG/ML IJ SOLN
40.0000 mg | Freq: Once | INTRAMUSCULAR | Status: AC
  Administered 2017-11-20: 40 mg via INTRAVENOUS
  Filled 2017-11-20: qty 4

## 2017-11-20 MED ORDER — WARFARIN - PHARMACIST DOSING INPATIENT
Freq: Every day | Status: DC
  Administered 2017-11-23: 1
  Administered 2017-11-24: 17:00:00

## 2017-11-20 MED ORDER — SILVER SULFADIAZINE 1 % EX CREA
TOPICAL_CREAM | Freq: Every day | CUTANEOUS | Status: DC
  Administered 2017-11-20 – 2017-11-23 (×4): via TOPICAL
  Administered 2017-11-24: 1 via TOPICAL
  Administered 2017-11-25: 09:00:00 via TOPICAL
  Filled 2017-11-20: qty 85

## 2017-11-20 MED ORDER — FUROSEMIDE 10 MG/ML IJ SOLN: 40.0000 mg | Freq: Once | INTRAMUSCULAR | Status: DC

## 2017-11-20 MED ORDER — PRAVASTATIN SODIUM 40 MG PO TABS
40.0000 mg | ORAL_TABLET | Freq: Every day | ORAL | Status: DC
  Administered 2017-11-20 – 2017-11-24 (×5): 40 mg via ORAL
  Filled 2017-11-20 (×5): qty 1

## 2017-11-20 MED ORDER — INSULIN ASPART 100 UNIT/ML ~~LOC~~ SOLN
0.0000 [IU] | Freq: Three times a day (TID) | SUBCUTANEOUS | Status: DC
  Administered 2017-11-21: 2 [IU] via SUBCUTANEOUS
  Administered 2017-11-22 (×2): 1 [IU] via SUBCUTANEOUS
  Administered 2017-11-22 – 2017-11-23 (×2): 2 [IU] via SUBCUTANEOUS
  Administered 2017-11-23: 1 [IU] via SUBCUTANEOUS
  Administered 2017-11-24: 2 [IU] via SUBCUTANEOUS
  Administered 2017-11-24 – 2017-11-25 (×2): 1 [IU] via SUBCUTANEOUS

## 2017-11-20 MED ORDER — FUROSEMIDE 10 MG/ML IJ SOLN: 60.0000 mg | Freq: Once | INTRAMUSCULAR | Status: DC

## 2017-11-20 MED ORDER — ONDANSETRON HCL 4 MG PO TABS: 4.0000 mg | ORAL_TABLET | Freq: Four times a day (QID) | ORAL | Status: DC | PRN

## 2017-11-20 MED ORDER — HYDROCODONE-ACETAMINOPHEN 5-325 MG PO TABS: 1.0000 | ORAL_TABLET | ORAL | Status: DC | PRN

## 2017-11-20 MED ORDER — ACETAMINOPHEN 325 MG PO TABS
650.0000 mg | ORAL_TABLET | Freq: Four times a day (QID) | ORAL | Status: DC | PRN
  Administered 2017-11-21 – 2017-11-23 (×3): 650 mg via ORAL
  Filled 2017-11-20 (×3): qty 2

## 2017-11-20 MED ORDER — POLYETHYLENE GLYCOL 3350 17 G PO PACK: 17.0000 g | PACK | Freq: Every day | ORAL | Status: DC | PRN

## 2017-11-20 MED ORDER — BISACODYL 10 MG RE SUPP: 10.0000 mg | Freq: Every day | RECTAL | Status: DC | PRN

## 2017-11-20 NOTE — Progress Notes (Signed)
Pt received from ED, vss, ra sat 98%, pt denies cp or sob, pt has wounds on her legs that ED nurse put silvadene on per order, she says she was to to wound doctor Tuesday, blood sugar 86, oj given and meal ordered.

## 2017-11-20 NOTE — ED Triage Notes (Signed)
Patient complains of shortness of breath when laying down for the last week. Patient thought she was getting better a few days ago but then last night she became short of breath again. History of congestive heart failure, states she has been propping herself up with pillows but it is not helping. Denies pain. Denies shortness of breath while seated in wheelchair.

## 2017-11-20 NOTE — ED Provider Notes (Signed)
Presents with shortness of breath which is worse with lying supine and improved with sitting upright onset 4-5 days ago. Symptoms feel like congestive heart failure she's had in the past. Denies noncompliance with medications. On exam alert no respiratory distress. Heart irregularly irregular. Lungs with rales right side one third the way up. Neck positive neck vein distention bilateral lower extremity is with1+ pretibial pitting edema. Right lower extremity with 2 dime-sized ulcers on cath which appear clean   Orlie Dakin, MD 11/20/17 1623

## 2017-11-20 NOTE — ED Notes (Signed)
Report attempted. Will call back in 5 minutes

## 2017-11-20 NOTE — Progress Notes (Signed)
[place] for warfarin Indication: atrial fibrillation   Assessment: 42 yof on warfarin PTA for afib. Pharmacy consulted to dose inpatient. INR 1.82 on admit. CBC wnl. No bleed documented.  PTA warfarin dose: 4mg  x 2 days; then 2mg  x 2 days; then repeat (last dose 6/1 PTA)  Goal of Therapy:  INR 2-3 Monitor platelets by anticoagulation protocol: Yes   Plan:  Warfarin 4mg  PO x 1 dose Daily INR Monitor CBC, s/sx bleeding  Elicia Lamp, PharmD, BCPS Clinical Pharmacist 11/20/2017 6:16 PM

## 2017-11-20 NOTE — ED Provider Notes (Signed)
[facility] EMERGENCY DEPARTMENT Provider Note   CSN: [id number] Arrival date & time: 11/20/17  1101     History   Chief Complaint Chief Complaint  Patient presents with  . Shortness of Breath    HPI Patricia Randall is a 84 y.o. female  a past medical history of atrial fibrillation currently on warfarin, CHF (EF 30-35%), diabetes, hypertension, who presents to ED for evaluation of shortness of breath for the past 5 days.  Patient was admitted to hospital May 26-28 for acute on chronic systolic CHF.  After her discharge she was placed on increased dose of her Lasix.  That she now takes 20 mg twice a day.  She states that after her discharge, she felt better until a few days later when she began having symptoms again.  She states that she has shortness of breath even with walking short distances and laying down at night.  She denies any chest pain, cough, hemoptysis, fevers, abdominal pain, nausea, URI symptoms. She reports to healing diabetic wound/ulcers on her right lower extremity.  The dressing was changed 2 days ago.  She has not yet followed up with her PCP for a post ED visit.  She is scheduled to meet in 2 days. States that her dry weight is 147 pounds.  Currently 149 pounds.   HPI  Past Medical History:  Diagnosis Date  . Atrial fibrillation ([place])    a. 03/2013 s/p TEE/DCCV;  b. 03/2013 Eliquis initiated.  . Chronic systolic CHF (congestive heart failure) ([place])    a. 03/2013 Echo: EF 20-25%, diff HK, mild to mod MR, sev dil LA, mild RV dysfxn, sev dil RA, mod TR.  . Claudication ([place])   . Glaucoma   . High cholesterol   . Hypertension   . Type II diabetes mellitus [facility])     Patient Active Problem List   Diagnosis Date Noted  . Type II diabetes mellitus ([place]) 11/13/2017  . Ulcer of right lower extremity ([place]) 11/13/2017  . Acute on chronic systolic CHF (congestive heart failure) ([place]) 09/26/2017  . Protein-calorie malnutrition, severe 07/05/2015  . CKD  (chronic kidney disease), stage III ([place])   . Essential hypertension, benign 03/13/2014  . Cardiomyopathy ([place]) 04/01/2013  . Permanent atrial fibrillation ([place]) 03/31/2013    Past Surgical History:  Procedure Laterality Date  . CARDIOVERSION N/A 04/02/2013   Procedure: CARDIOVERSION;  Surgeon: Thayer Headings, MD;  Location: [place];  Service: Cardiovascular;  Laterality: N/A;  Spoke with Tom   . ESOPHAGOGASTRODUODENOSCOPY N/A 05/26/2016   Procedure: ESOPHAGOGASTRODUODENOSCOPY (EGD);  Surgeon: Ladene Artist, MD;  Location: [facility] ENDOSCOPY;  Service: Endoscopy;  Laterality: N/A;  . EYE SURGERY Right    "had blood in it"   . TEE WITHOUT CARDIOVERSION N/A 04/02/2013   Procedure: TRANSESOPHAGEAL ECHOCARDIOGRAM (TEE);  Surgeon: Thayer Headings, MD;  Location: [place];  Service: Cardiovascular;  Laterality: N/A;     OB History   None      Home Medications    Prior to Admission medications   Medication Sig Start Date End Date Taking? Authorizing Provider  carvedilol (COREG) 3.125 MG tablet Take 3 tablets (9.375 mg total) by mouth 2 (two) times daily with a meal. 11/15/17   Emokpae, Courage, MD  cholecalciferol (VITAMIN D) 1000 units tablet Take 1,000 Units by mouth daily.    [provider]  feeding supplement, GLUCERNA SHAKE, (GLUCERNA SHAKE) LIQD Take 237 mLs by mouth daily.    [provider]  furosemide (  LASIX) 20 MG tablet Take 1 tablet (20 mg total) by mouth 2 (two) times daily at 10 am and 4 pm. 11/15/17 11/15/18  Roxan Hockey, MD  glimepiride (AMARYL) 4 MG tablet Take 1 tablet (4 mg total) by mouth daily before breakfast. 11/15/17   Emokpae, Courage, MD  latanoprost (XALATAN) 0.005 % ophthalmic solution Place 1 drop into both eyes at bedtime.    [provider]  lovastatin (MEVACOR) 40 MG tablet Take 1 tablet (40 mg total) by mouth at bedtime. 11/15/17   Roxan Hockey, MD  Multiple Vitamins-Minerals (HAIR/SKIN/NAILS/BIOTIN PO) Take 1 tablet by  mouth daily.    [provider]  polyethylene glycol (MIRALAX / GLYCOLAX) packet Take 17 g by mouth daily as needed for mild constipation. 09/29/17   Lavina Hamman, MD  silver sulfADIAZINE (SILVADENE) 1 % cream Apply to affected area daily Patient not taking: Reported on 11/13/2017 09/30/17 09/30/18  Lavina Hamman, MD  warfarin (COUMADIN) 2 MG tablet Take 1 tablet (2 mg total) by mouth daily. Patient taking differently: Take 2-4 mg by mouth See admin instructions. Take 2 tablets (4mg ) by mouth daily after breakfast for two days then 1 tablet (2mg ) next day after breakfast, then repeat (2,2,1) 05/31/16   Nita Sells, MD    Family History Family History  Problem Relation Age of Onset  . Other Mother        died in her 77's - 'just got sick.'  . Other Father        died @ 71, unknown cause.  . Diabetes Brother   . Other Unknown        12 siblings + 7 more 1/2 siblings.    Social History Social History   Tobacco Use  . Smoking status: Never Smoker  . Smokeless tobacco: Never Used  Substance Use Topics  . Alcohol use: No  . Drug use: No     Allergies   Patient has no known allergies.   Review of Systems Review of Systems  Constitutional: Negative for appetite change, chills and fever.  HENT: Negative for ear pain, rhinorrhea, sneezing and sore throat.   Eyes: Negative for photophobia and visual disturbance.  Respiratory: Positive for shortness of breath. Negative for cough, chest tightness and wheezing.   Cardiovascular: Positive for leg swelling. Negative for chest pain and palpitations.  Gastrointestinal: Negative for abdominal pain, blood in stool, constipation, diarrhea, nausea and vomiting.  Genitourinary: Negative for dysuria, hematuria and urgency.  Musculoskeletal: Negative for myalgias.  Skin: Positive for wound. Negative for rash.  Neurological: Negative for dizziness, weakness and light-headedness.     Physical Exam Updated Vital Signs BP  (!) 144/90   Pulse (!) 51   Temp 97.8 F (36.6 C) (Oral)   Resp 20   Ht 5\' 1"  (1.549 m)   Wt 67.6 kg (149 lb)   SpO2 100%   BMI 28.15 kg/m   Physical Exam  Constitutional: She appears well-developed and well-nourished. No distress.  HENT:  Head: Normocephalic and atraumatic.  Nose: Nose normal.  Eyes: Conjunctivae and EOM are normal. Left eye exhibits no discharge. No scleral icterus.  Neck: Normal range of motion. Neck supple.  Cardiovascular: Normal rate, normal heart sounds and intact distal pulses. An irregularly irregular rhythm present. Exam reveals no gallop and no friction rub.  No murmur heard. Pulmonary/Chest: Effort normal. No respiratory distress. She has rales in the right lower field and the left lower field.  Abdominal: Soft. Bowel sounds are normal. She exhibits  no distension. There is no tenderness. There is no guarding.  Musculoskeletal: Normal range of motion. She exhibits edema.       Right lower leg: She exhibits edema.       Left lower leg: She exhibits edema.  Bilateral 2+ pitting edema in lower extremities.  There are 2 coin-sized wounds on right lower extremity.  No overlying erythema or active drainage noted.   Neurological: She is alert. She exhibits normal muscle tone. Coordination normal.  Skin: Skin is warm and dry. No rash noted.  Psychiatric: She has a normal mood and affect.  Nursing note and vitals reviewed.    ED Treatments / Results  Labs (all labs ordered are listed, but only abnormal results are displayed) Labs Reviewed  BASIC METABOLIC PANEL - Abnormal; Notable for the following components:      Result Value   Glucose, Bld 171 (*)    BUN 47 (*)    Creatinine, Ser 1.87 (*)    Calcium 8.6 (*)    GFR calc non Af Amer 23 (*)    GFR calc Af Amer 27 (*)    All other components within normal limits  CBC - Abnormal; Notable for the following components:   WBC 3.8 (*)    RDW 18.6 (*)    All other components within normal limits  BRAIN  NATRIURETIC PEPTIDE - Abnormal; Notable for the following components:   B Natriuretic Peptide 1,151.7 (*)    All other components within normal limits  PROTIME-INR - Abnormal; Notable for the following components:   Prothrombin Time 20.9 (*)    All other components within normal limits  I-STAT TROPONIN, ED    EKG EKG Interpretation  Date/Time:  Sunday November 20 2017 11:14:25 EDT Ventricular Rate:  92 PR Interval:    QRS Duration: 94 QT Interval:  396 QTC Calculation: 489 R Axis:   -99 Text Interpretation:  Atrial fibrillation Right superior axis deviation Anterior infarct , age undetermined ST & T wave abnormality, consider inferolateral ischemia Abnormal ECG SINCE LAST TRACING HEART RATE HAS INCREASED Confirmed by Jacubowitz, Sam ([id number]) on 11/20/2017 12:31:57 PM   Radiology Dg Chest 2 View  Result Date: 11/20/2017 CLINICAL DATA:  Shortness of breath, worse at night and when lying down. History of CHF. EXAM: CHEST - 2 VIEW COMPARISON:  Chest x-ray dated 11/13/2017, chest x-ray dated 07/05/2017 FINDINGS: Stable cardiomegaly. Lungs are clear. No evidence of active CHF at this time. No confluent opacity to suggest a developing pneumonia. No pleural effusion or pneumothorax seen. No acute or suspicious osseous finding. IMPRESSION: No active cardiopulmonary disease. No evidence of pneumonia or pulmonary edema. Stable cardiomegaly. Electronically Signed   By: Stan  Maynard M.D.   On: 11/20/2017 11:33    Procedures Procedures (including critical care time)  Medications Ordered in ED Medications  furosemide (LASIX) injection 60 mg (has no administration in time range)     Initial Impression / Assessment and Plan / ED Course  I have reviewed the triage vital signs and the nursing notes.  Pertinent labs & imaging results that were available during my care of the patient were reviewed by me and considered in my medical decision making (see chart for details).     84 yo AAF with a past  medical history of atrial fibrillation currently on warfarin, CHF, diabetes, hypertension, who presents to ED for evaluation of shortness of breath for the past 4 to 5 days.  She was admitted and discharged from the hospital May 26-28 for  acute on chronic systolic CHF.  She was placed on increased dose of her Lasix.  She reports compliance with this medication.  She felt better after her discharge but a day or 2 later she began having the same shortness of breath, dyspnea on exertion, shortness of breath worse when laying down.  She also reports lower extremity edema.  States that the wounds on her right lower extremity are being addressed and changed every 2 days.  She has not yet followed up with her PCP for a post ED visit.  On physical exam patient has bilateral lower extremity pitting edema.  Currently in A. fib.  She is afebrile.  Neck veins are distended.  BMP significant for creatinine of 1.87 and BUN of 47.  This is improved from her creatinine of 2.0 her discharge last week. CBC unremarkable.  Troponin is negative.  BNP elevated to over 1000.  This appears elevated peer to her prior values.  INR 1.8.  Chest x-ray unremarkable.  EKG with tracing similar to prior.  She will be given a dose of IV Lasix 60 mg and will be admitted to the hospitalist service for further management of her acute on chronic heart failure. Patient discussed with and seen by my attending, Dr. Winfred Leeds.  Portions of this note were generated with [employer]. Dictation errors may occur despite best attempts at proofreading.   Final Clinical Impressions(s) / ED Diagnoses   Final diagnoses:  Acute congestive heart failure, unspecified heart failure type [facility])    ED Discharge Orders    None       Delia Heady, PA-C 11/20/17 [place], MD 11/20/17 1622

## 2017-11-20 NOTE — H&P (Addendum)
History and Physical    Patricia Randall KPT:[id number] DOB: February 22, 1929 DOA: 11/20/2017   PCP: Leonard Downing, MD   Patient coming from:  Home    Chief Complaint: SHortness of breath   HPI: Patricia Randall is a 84 y.o. female with medical history significant for atrial fibrillation currently on Coumadin, CHF with EF 30 to 35%, diabetes, hypertension, recent admission for CHF exacerbation discharged on 11/15/2017, now presenting to the emergency department with increasing shortness of breath, worse with lying supine, improved with sitting upright, present since discharge, patient having increased her Lasix to 20 mg twice a day.  Reports having been compliant with her medications, in fact, right prior to presentation, she took 60 mg orally without improvement.  She walks about 10 feet then coming to lay down, Randall to shortness of breath.  She denies any chest pain, palpitations, cough or productive mucus, fever, chills, abdominal pain, nausea or vomiting.  She has not yet followed up with her PCP after her ED visit, which was to see him in 2 days for now.  She reports having gained 2 pounds since last admission, she is not sure what her dry weight is, but she knows that she left with 147 pounds and today she is 149 pounds.  She reports her legs to be "accumulating fluid ", but denies calf pain.  She reports that she has been drinking a lot Randall to the heat.She denies any worsening ulcerations in the known lesions she has on her calf.  She denies any tobacco, alcohol or recreational drug use.  He is unsure which medications she has taken this morning.  Denies any sick contacts.  ED Course:  BP (!) 137/92   Pulse 96   Temp 97.8 F (36.6 C) (Oral)   Resp 18   Ht 5\' 1"  (1.549 m)   Wt 67.6 kg (149 lb)   SpO2 100%   BMI 28.15 kg/m   Chest x-ray with no active cardiopulmonary disease, no pneumonia or pulmonary edema.  Stable cardiomegaly. Sodium 139, potassium 3.9, bicarb 22.  Glucose 171.  BUN 47, creatinine  1.87, which is improved from prior hospitalization at 2.0.   BNP 1151.7 Troponin 0 0.01 White count 3.8, hemoglobin 12.9, platelets 156 D 20.9, INR 1.82 Compared to a prior study in 2015, the LVEF has decreased to   30-35% with global hypokinesis - atrial fibrillation is present.   There is moderate LAE.  Review of Systems:  As per HPI otherwise all other systems reviewed and are negative  Past Medical History:  Diagnosis Date  . Atrial fibrillation ([place])    a. 03/2013 s/p TEE/DCCV;  b. 03/2013 Eliquis initiated.  . Chronic systolic CHF (congestive heart failure) ([place])    a. 03/2013 Echo: EF 20-25%, diff HK, mild to mod MR, sev dil LA, mild RV dysfxn, sev dil RA, mod TR.  . Claudication ([place])   . Glaucoma   . High cholesterol   . Hypertension   . Type II diabetes mellitus ([place])     Past Surgical History:  Procedure Laterality Date  . CARDIOVERSION N/A 04/02/2013   Procedure: CARDIOVERSION;  Surgeon: Thayer Headings, MD;  Location: [facility];  Service: Cardiovascular;  Laterality: N/A;  Spoke with Tom   . ESOPHAGOGASTRODUODENOSCOPY N/A 05/26/2016   Procedure: ESOPHAGOGASTRODUODENOSCOPY (EGD);  Surgeon: Ladene Artist, MD;  Location[facility] ENDOSCOPY;  Service: Endoscopy;  Laterality: N/A;  . EYE SURGERY Right    "had blood in it"   . TEE WITHOUT  CARDIOVERSION N/A 04/02/2013   Procedure: TRANSESOPHAGEAL ECHOCARDIOGRAM (TEE);  Surgeon: Thayer Headings, MD;  Location: [facility] ENDOSCOPY;  Service: Cardiovascular;  Laterality: N/A;    Social History Social History   Socioeconomic History  . Marital status: Widowed    Spouse name: Not on file  . Number of children: Not on file  . Years of education: Not on file  . Highest education level: Not on file  Occupational History  . Not on file  Social Needs  . Financial resource strain: Not on file  . Food insecurity:    Worry: Not on file    Inability: Not on file  . Transportation needs:    Medical: Not on file    Non-medical: Not  on file  Tobacco Use  . Smoking status: Never Smoker  . Smokeless tobacco: Never Used  Substance and Sexual Activity  . Alcohol use: No  . Drug use: No  . Sexual activity: Never  Lifestyle  . Physical activity:    Days per week: Not on file    Minutes per session: Not on file  . Stress: Not on file  Relationships  . Social connections:    Talks on phone: Not on file    Gets together: Not on file    Attends religious service: Not on file    Active member of club or organization: Not on file    Attends meetings of clubs or organizations: Not on file    Relationship status: Not on file  . Intimate partner violence:    Fear of current or ex partner: Not on file    Emotionally abused: Not on file    Physically abused: Not on file    Forced sexual activity: Not on file  Other Topics Concern  . Not on file  Social History Narrative   Lives in [place] with her son.     No Known Allergies  Family History  Problem Relation Age of Onset  . Other Mother        died in her 72's - 'just got sick.'  . Other Father        died @ 28, unknown cause.  . Diabetes Brother   . Other Unknown        12 siblings + 7 more 1/2 siblings.      Prior to Admission medications   Medication Sig Start Date End Date Taking? Authorizing Provider  carvedilol (COREG) 3.125 MG tablet Take 3 tablets (9.375 mg total) by mouth 2 (two) times daily with a meal. 11/15/17   Emokpae, Courage, MD  cholecalciferol (VITAMIN D) 1000 units tablet Take 1,000 Units by mouth daily.    [provider]  feeding supplement, GLUCERNA SHAKE, (GLUCERNA SHAKE) LIQD Take 237 mLs by mouth daily.    [provider]  furosemide (LASIX) 20 MG tablet Take 1 tablet (20 mg total) by mouth 2 (two) times daily at 10 am and 4 pm. 11/15/17 11/15/18  Roxan Hockey, MD  glimepiride (AMARYL) 4 MG tablet Take 1 tablet (4 mg total) by mouth daily before breakfast. 11/15/17   Emokpae, Courage, MD  latanoprost (XALATAN) 0.005  % ophthalmic solution Place 1 drop into both eyes at bedtime.    [provider]  lovastatin (MEVACOR) 40 MG tablet Take 1 tablet (40 mg total) by mouth at bedtime. 11/15/17   Roxan Hockey, MD  Multiple Vitamins-Minerals (HAIR/SKIN/NAILS/BIOTIN PO) Take 1 tablet by mouth daily.    [provider]  polyethylene glycol (MIRALAX /  GLYCOLAX) packet Take 17 g by mouth daily as needed for mild constipation. 09/29/17   Lavina Hamman, MD  silver sulfADIAZINE (SILVADENE) 1 % cream Apply to affected area daily Patient not taking: Reported on 11/13/2017 09/30/17 09/30/18  Lavina Hamman, MD  warfarin (COUMADIN) 2 MG tablet Take 1 tablet (2 mg total) by mouth daily. Patient taking differently: Take 2-4 mg by mouth See admin instructions. Take 2 tablets (4mg ) by mouth daily after breakfast for two days then 1 tablet (2mg ) next day after breakfast, then repeat (2,2,1) 05/31/16   Nita Sells, MD    Physical Exam:  Vitals:   11/20/17 1330 11/20/17 1400 11/20/17 1429 11/20/17 1430  BP: (!) 121/92 (!) 137/95  (!) 137/92  Pulse: (!) 30 78  96  Resp: 20 (!) 24  18  Temp:      TempSrc:      SpO2: 100% 100% 100% 100%  Weight:      Height:       Constitutional: NAD, calm, comfortable  Eyes: PERRL, lids and conjunctivae normal ENMT: Mucous membranes are moist, without exudate or lesions.  The patient has several missing teeth. Neck: normal, supple, no masses, no thyromegaly.  JVD is present around 3 cm Respiratory: She demonstrates bibasilar Rales.  No wheezing,Normal respiratory effort  Cardiovascular: Irregularly irregular` rate and rhythm,  murmur, rubs or gallops bilateral 2+ lower extremity edema. 2+ pedal pulses. No carotid bruits.  Abdomen: Soft, non tender, No hepatosplenomegaly. Bowel sounds positive.  Musculoskeletal: no clubbing / cyanosis. Moves all extremities Skin: no jaundice, there are 2 2 cm wounds on the right lower extremity, without erythema or odor or  significant purulent drainage. Neurologic: Sensation intact  Strength equal in all extremities Psychiatric:   Alert and oriented x 3. Normal mood.    Labs on Admission: I have personally reviewed following labs and imaging studies  CBC: Recent Labs  Lab 11/13/17 1609 11/20/17 1117  WBC 4.0 3.8*  HGB 13.1 12.9  HCT 39.9 40.0  MCV 83.3 83.7  PLT 163 696    Basic Metabolic Panel: Recent Labs  Lab 11/13/17 1609 11/13/17 1913 11/14/17 0308 11/15/17 0622 11/20/17 1117  NA 143  --  142 145 139  K 3.6  --  3.7 3.5 3.9  CL 111  --  108 106 108  CO2 23  --  22 29 22   GLUCOSE 74  --  199* 128* 171*  BUN 37*  --  38* 39* 47*  CREATININE 1.63*  --  1.69* 2.00* 1.87*  CALCIUM 8.7*  --  9.2 9.3 8.6*  MG  --  1.6*  --   --   --     GFR: Estimated Creatinine Clearance: 19 mL/min (A) (by C-G formula based on SCr of 1.87 mg/dL (H)).  Liver Function Tests: No results for input(s): AST, ALT, ALKPHOS, BILITOT, PROT, ALBUMIN in the last 168 hours. No results for input(s): LIPASE, AMYLASE in the last 168 hours. No results for input(s): AMMONIA in the last 168 hours.  Coagulation Profile: Recent Labs  Lab 11/13/17 1913 11/14/17 0308 11/15/17 0622 11/20/17 1236  INR 1.84 1.78 1.81 1.82    Cardiac Enzymes: No results for input(s): CKTOTAL, CKMB, CKMBINDEX, TROPONINI in the last 168 hours.  BNP (last 3 results) No results for input(s): PROBNP in the last 8760 hours.  HbA1C: No results for input(s): HGBA1C in the last 72 hours.  CBG: Recent Labs  Lab 11/14/17 1720 11/14/17 1941 11/14/17 2200 11/15/17 7893 11/15/17  [place] 99 156* 157* 130* 156*    Lipid Profile: No results for input(s): CHOL, HDL, LDLCALC, TRIG, CHOLHDL, LDLDIRECT in the last 72 hours.  Thyroid Function Tests: No results for input(s): TSH, T4TOTAL, FREET4, T3FREE, THYROIDAB in the last 72 hours.  Anemia Panel: No results for input(s): VITAMINB12, FOLATE, FERRITIN, TIBC, IRON, RETICCTPCT in  the last 72 hours.  Urine analysis:    Component Value Date/Time   COLORURINE YELLOW 09/02/2017 1354   APPEARANCEUR CLEAR 09/02/2017 1354   LABSPEC 1.010 09/02/2017 1354   PHURINE 5.0 09/02/2017 1354   GLUCOSEU NEGATIVE 09/02/2017 1354   HGBUR NEGATIVE 09/02/2017 [place] 09/02/2017 1354   KETONESUR NEGATIVE 09/02/2017 1354   PROTEINUR NEGATIVE 09/02/2017 1354   NITRITE NEGATIVE 09/02/2017 1354   LEUKOCYTESUR NEGATIVE 09/02/2017 1354    Sepsis Labs: @LABRCNTIP (procalcitonin:4,lacticidven:4) )No results found for this or any previous visit (from the past 240 hour(s)).   Radiological Exams on Admission: Dg Chest 2 View  Result Date: 11/20/2017 CLINICAL DATA:  Shortness of breath, worse at night and when lying down. History of CHF. EXAM: CHEST - 2 VIEW COMPARISON:  Chest x-ray dated 11/13/2017, chest x-ray dated 07/05/2017 FINDINGS: Stable cardiomegaly. Lungs are clear. No evidence of active CHF at this time. No confluent opacity to suggest a developing pneumonia. No pleural effusion or pneumothorax seen. No acute or suspicious osseous finding. IMPRESSION: No active cardiopulmonary disease. No evidence of pneumonia or pulmonary edema. Stable cardiomegaly. Electronically Signed   By: Franki Cabot M.D.   On: 11/20/2017 11:33    EKG: Independently reviewed.  Assessment/Plan Principal Problem:   Acute on chronic systolic CHF (congestive heart failure) (HCC) Active Problems:   Permanent atrial fibrillation (HCC)   Cardiomyopathy (HCC)   Essential hypertension, benign   CKD (chronic kidney disease), stage III (HCC)   Protein-calorie malnutrition, severe   Type II diabetes mellitus ([place])   Ulcer of right lower extremity (HCC)   CHF exacerbation (HCC)    Acute on chronic  systolic   CHF presenting with 5-day history of progressive shortness of breath, with orthopnea, without chest pain, fever or chills, since her last admission to the hospital discharge on  11/15/2017.  The patient reports being compliant with her Lasix, which she has increased, including taking 60 mg prior to presentation.  She still notices increasing edema, and a 2 pound weight gain since discharge.  Chest x-ray with no active cardiopulmonary disease, no pneumonia or pulmonary edema.  Stable cardiomegaly. recent echo 30-35% with global hypokinesis - atrial fibrillation is present.   There is moderate LAE .BNP 1151.7.  Troponin negative she is receiving Lasix 40 mg IV at the ED, and is to continue twice daily. PLace on Telemetry obs  IV Lasix 40 mg twice daily Hold Coreg monitor I/Os and daily weights prn 02  Cardiac monitoring In view of frequent admissions, the third DC year regarding CHF exacerbation, will obtain care management, to help with home health nursing, regarding administration of medications.  The patient lives alone, and it is unclear how compliant she is with her medications, especially regarding schedule.  The patient would benefit of a home health nurse.  PT/OT in am Fluid intake restriction  Atrial fibrillation, on chronic Coumadin. CHADSVASC 5 Continue cardiac monitoring Continue Coumadin  Chronic kidney disease stage Current Cr is 1.87, improved from prior admission (2.)  Lab Results  Component Value Date   CREATININE 1.87 (H) 11/20/2017   CREATININE 2.00 (H) 11/15/2017   CREATININE  1.69 (H) 11/14/2017  Patient is on Lasix, will repeat chemistries on a daily basis, to monitor the renal function. Hold NSAIDS Randall to CHF exacerbation, no IV fluids will be given at this time.    Type II Diabetes Current blood sugar level is 171 Lab Results  Component Value Date   HGBA1C 7.6 (H) 09/27/2017  Hold home oral diabetic medications.   SSI  Right leg ulcer, the patient follows with wound care as an outpatient, is Randall to see then again sometime this week.  No surrounding erythema, or purulent drainage or systemic features to suggest active infection. The new  wound care at the hospital. COntinue silvadene cream   Poor nutritional status.  Albumin is 3.1  For now, continue Glucerna shake, but will obtain nutritional consultation.    DVT prophylaxis: Coumadin Code Status:    Full, after discussion with the patient, she is not interested in changing her current status or having palliative care evaluation. Family Communication:  Discussed with patient Disposition Plan: Expect patient to be discharged to home after condition improves Consults called:    None Admission status: Telemetry observation   Sharene Butters, PA-C [facility]   Amion text  [phone]   11/20/2017, 2:57 PM

## 2017-11-21 ENCOUNTER — Encounter (HOSPITAL_COMMUNITY): Payer: Self-pay | Admitting: General Practice

## 2017-11-21 DIAGNOSIS — I482 Chronic atrial fibrillation: Secondary | ICD-10-CM

## 2017-11-21 DIAGNOSIS — I10 Essential (primary) hypertension: Secondary | ICD-10-CM | POA: Diagnosis not present

## 2017-11-21 DIAGNOSIS — N183 Chronic kidney disease, stage 3 (moderate): Secondary | ICD-10-CM

## 2017-11-21 DIAGNOSIS — E43 Unspecified severe protein-calorie malnutrition: Secondary | ICD-10-CM | POA: Diagnosis not present

## 2017-11-21 DIAGNOSIS — I5023 Acute on chronic systolic (congestive) heart failure: Secondary | ICD-10-CM

## 2017-11-21 DIAGNOSIS — E1122 Type 2 diabetes mellitus with diabetic chronic kidney disease: Secondary | ICD-10-CM | POA: Diagnosis not present

## 2017-11-21 DIAGNOSIS — L97919 Non-pressure chronic ulcer of unspecified part of right lower leg with unspecified severity: Secondary | ICD-10-CM | POA: Diagnosis not present

## 2017-11-21 LAB — CBC
HEMATOCRIT: 44.3 % (ref 36.0–46.0)
HEMOGLOBIN: 14.3 g/dL (ref 12.0–15.0)
MCH: 27.2 pg (ref 26.0–34.0)
MCHC: 32.3 g/dL (ref 30.0–36.0)
MCV: 84.4 fL (ref 78.0–100.0)
Platelets: 163 10*3/uL (ref 150–400)
RBC: 5.25 MIL/uL — ABNORMAL HIGH (ref 3.87–5.11)
RDW: 19.5 % — ABNORMAL HIGH (ref 11.5–15.5)
WBC: 4.5 10*3/uL (ref 4.0–10.5)

## 2017-11-21 LAB — BASIC METABOLIC PANEL
ANION GAP: 9 (ref 5–15)
BUN: 48 mg/dL — ABNORMAL HIGH (ref 6–20)
CO2: 25 mmol/L (ref 22–32)
Calcium: 9 mg/dL (ref 8.9–10.3)
Chloride: 107 mmol/L (ref 101–111)
Creatinine, Ser: 1.89 mg/dL — ABNORMAL HIGH (ref 0.44–1.00)
GFR calc Af Amer: 27 mL/min — ABNORMAL LOW (ref >60)
GFR calc non Af Amer: 23 mL/min — ABNORMAL LOW (ref >60)
GLUCOSE: 135 mg/dL — AB (ref 65–99)
POTASSIUM: 4 mmol/L (ref 3.5–5.1)
Sodium: 141 mmol/L (ref 135–145)

## 2017-11-21 LAB — PROTIME-INR
INR: 1.66
Prothrombin Time: 19.5 seconds — ABNORMAL HIGH (ref 11.4–15.2)

## 2017-11-21 LAB — GLUCOSE, CAPILLARY
GLUCOSE-CAPILLARY: 114 mg/dL — AB (ref 65–99)
GLUCOSE-CAPILLARY: 163 mg/dL — AB (ref 65–99)
GLUCOSE-CAPILLARY: 115 mg/dL — AB (ref 65–99)
GLUCOSE-CAPILLARY: 190 mg/dL — AB (ref 65–99)

## 2017-11-21 MED ORDER — FUROSEMIDE 10 MG/ML IJ SOLN
40.0000 mg | Freq: Two times a day (BID) | INTRAMUSCULAR | Status: AC
  Administered 2017-11-21 – 2017-11-22 (×4): 40 mg via INTRAVENOUS
  Filled 2017-11-21 (×4): qty 4

## 2017-11-21 MED ORDER — WARFARIN SODIUM 5 MG PO TABS
5.0000 mg | ORAL_TABLET | Freq: Once | ORAL | Status: AC
  Administered 2017-11-21: 5 mg via ORAL
  Filled 2017-11-21: qty 1

## 2017-11-21 NOTE — Evaluation (Signed)
Physical Therapy Evaluation/Discharge Patient Details Name: Patricia Randall MRN: [id number] DOB: 31-May-1929 Today's Date: 11/21/2017   History of Present Illness  84 y.o. female with medical history significant for atrial fibrillation currently on Coumadin, CHF with EF 30 to 35%, diabetes, hypertension, recent admission for CHF exacerbation discharged on 11/15/2017. Presenting to ED with increased SOB.  Clinical Impression  Pt demonstrates mobility level at baseline level as pt ambulated 250 ft on RA without desaturation. Pt able to navigate home setup and has adequate home support from son. Pt did demonstrate increased stability in gait with RW which is being recommended by PT. Pt agrees with all recommendations and agreeable to D/C from PT.    Follow Up Recommendations No PT follow up    Equipment Recommendations  Rolling walker with 5" wheels    Recommendations for Other Services       Precautions / Restrictions Precautions Precautions: None Restrictions Weight Bearing Restrictions: No      Mobility  Bed Mobility Overal bed mobility: Modified Independent             General bed mobility comments: Pt performed supine>sit with HOB elevated mod I. Pt able to scoot to EOB and reach to pull socks up  Transfers Overall transfer level: Modified independent Equipment used: None             General transfer comment: Pt requires increased time to stand from bed but demonstrates not instability with sit>stand  Ambulation/Gait Ambulation/Gait assistance: Min guard;Modified independent (Device/Increase time) Ambulation Distance (Feet): 250 Feet Assistive device: None;Rolling walker (2 wheeled) Gait Pattern/deviations: Decreased stride length;Step-through pattern Gait velocity: decreased   General Gait Details: Pt ambulated 225 ft without AD and 25 ft with RW. Without RW, pt is min guard for safety and ambulates with decreased arm swing. With RW, pt ambulates Mod I with increased  stabilty noted. Pt educated regarding use of RW when ambulating and positioning for transfer. Pt SpO2 remained above 90% throughout gait on RA  Stairs Stairs: Yes Stairs assistance: Supervision Stair Management: One rail Right;Step to pattern Number of Stairs: 4 General stair comments: Pt ascended and descended 4 stairs with step to pattern and use of R hand rail (ascending) with supervision for safety  Wheelchair Mobility    Modified Rankin (Stroke Patients Only)       Balance Overall balance assessment: Needs assistance Sitting-balance support: No upper extremity supported;Feet unsupported Sitting balance-Leahy Scale: Good       Standing balance-Leahy Scale: Good                               Pertinent Vitals/Pain Pain Assessment: 0-10 Pain Score: 4  Pain Location: RLE wound Pain Descriptors / Indicators: Discomfort Pain Intervention(s): Limited activity within patient's tolerance;Monitored during session    Home Living Family/patient expects to be discharged to:: Private residence Living Arrangements: Children Available Help at Discharge: Family;Available PRN/intermittently Type of Home: House Home Access: Stairs to enter Entrance Stairs-Rails: Right Entrance Stairs-Number of Steps: 3 Home Layout: One level Home Equipment: Cane - single point Additional Comments: Pt owns cane but does not use    Prior Function Level of Independence: Independent         Comments: No falls in past year reported; pt enjoys going to church;      Journalist, newspaper        Extremity/Trunk Assessment   Upper Extremity Assessment Upper Extremity Assessment: Overall WFL for tasks assessed  Lower Extremity Assessment Lower Extremity Assessment: Overall WFL for tasks assessed       Communication   Communication: No difficulties  Cognition Arousal/Alertness: Awake/alert Behavior During Therapy: WFL for tasks assessed/performed Overall Cognitive Status: Within  Functional Limits for tasks assessed                                        General Comments      Exercises     Assessment/Plan    PT Assessment Patent does not need any further PT services  PT Problem List         PT Treatment Interventions      PT Goals (Current goals can be found in the Care Plan section)  Acute Rehab PT Goals Patient Stated Goal: To go home without O2 PT Goal Formulation: With patient Time For Goal Achievement: 12/05/17 Potential to Achieve Goals: Good    Frequency     Barriers to discharge        Co-evaluation               AM-PAC PT "6 Clicks" Daily Activity  Outcome Measure Difficulty turning over in bed (including adjusting bedclothes, sheets and blankets)?: None Difficulty moving from lying on back to sitting on the side of the bed? : None Difficulty sitting down on and standing up from a chair with arms (e.g., wheelchair, bedside commode, etc,.)?: None Help needed moving to and from a bed to chair (including a wheelchair)?: A Little Help needed walking in hospital room?: A Little Help needed climbing 3-5 steps with a railing? : A Little 6 Click Score: 21    End of Session Equipment Utilized During Treatment: Gait belt Activity Tolerance: Patient tolerated treatment well Patient left: in chair;with call bell/phone within reach Nurse Communication: Mobility status PT Visit Diagnosis: Other abnormalities of gait and mobility (R26.89)    Time: 7262-0355 PT Time Calculation (min) (ACUTE ONLY): 29 min   Charges:   PT Evaluation $PT Eval Low Complexity: 1 Low PT Treatments $Gait Training: 8-22 mins   PT G Codes:        Gabe Dewanna Hurston, SPT  [employer] 11/21/2017, 11:22 AM

## 2017-11-21 NOTE — Evaluation (Signed)
Occupational Therapy Evaluation and Discharge Patient Details Name: Patricia Randall MRN: [id number] DOB: 11/28/1928 Today's Date: 11/21/2017    History of Present Illness 84 y.o. female with medical history significant for atrial fibrillation currently on Coumadin, CHF with EF 30 to 35%, diabetes, hypertension, recent admission for CHF exacerbation discharged on 11/15/2017. Presenting to ED with increased SOB.   Clinical Impression   PTA, pt was living with her son and performs ADLs and IADLs. Pt currently performing ADLs and functional mobility at supervision level and presents near baseline function. Pt stating that her main concern is being able to breath at night when in supine. Recommend dc home once medically stable per physician. All acute OT needs met and will sign off. Thank you.     Follow Up Recommendations  No OT follow up;Supervision - Intermittent    Equipment Recommendations  None recommended by OT    Recommendations for Other Services       Precautions / Restrictions Precautions Precautions: None Restrictions Weight Bearing Restrictions: No      Mobility Bed Mobility               General bed mobility comments: In recliner upon arrival  Transfers Overall transfer level: Modified independent Equipment used: None             General transfer comment: Pt requires increased time to stand from bed but demonstrates not instability with sit>stand    Balance Overall balance assessment: Needs assistance Sitting-balance support: No upper extremity supported;Feet unsupported Sitting balance-Leahy Scale: Good       Standing balance-Leahy Scale: Good                             ADL either performed or assessed with clinical judgement   ADL Overall ADL's : Needs assistance/impaired Eating/Feeding: Set up;Sitting   Grooming: Oral care;Supervision/safety;Set up;Standing   Upper Body Bathing: Set up;Supervision/ safety;Standing   Lower Body  Bathing: Set up;Supervison/ safety;Sit to/from stand   Upper Body Dressing : Set up;Supervision/safety;Sitting   Lower Body Dressing: Set up;Supervision/safety;Sit to/from stand Lower Body Dressing Details (indicate cue type and reason): supervision for safety in standing. Pt able to adjust socks by bringing ankles to knees Toilet Transfer: Supervision/safety;Ambulation(Simulated to recliner)         Tub/Shower Transfer Details (indicate cue type and reason): Discussed tub transfer safety. Pt verbalizing no difficulty with tub transfers Functional mobility during ADLs: Supervision/safety General ADL Comments: Pt performing ADLs and functional mobility at supervision level. Pt's main concern is poor SOB during sleeping at night and laying flat     Vision Baseline Vision/History: Wears glasses Patient Visual Report: No change from baseline       Perception     Praxis      Pertinent Vitals/Pain Pain Assessment: Faces Faces Pain Scale: Hurts a little bit Pain Location: RLE wound Pain Descriptors / Indicators: Discomfort Pain Intervention(s): Monitored during session;Repositioned     Hand Dominance Right   Extremity/Trunk Assessment Upper Extremity Assessment Upper Extremity Assessment: Overall WFL for tasks assessed   Lower Extremity Assessment Lower Extremity Assessment: Overall WFL for tasks assessed       Communication Communication Communication: No difficulties   Cognition Arousal/Alertness: Awake/alert Behavior During Therapy: WFL for tasks assessed/performed Overall Cognitive Status: Within Functional Limits for tasks assessed  General Comments       Exercises     Shoulder Instructions      Home Living Family/patient expects to be discharged to:: Private residence Living Arrangements: Children(Son) Available Help at Discharge: Family;Available PRN/intermittently Type of Home: House Home Access:  Stairs to enter [employer] of Steps: 3 Entrance Stairs-Rails: Right Home Layout: One level     Bathroom Shower/Tub: Teacher, early years/pre: Standard     Home Equipment: Cane - single point   Additional Comments: Pt owns cane but does not use      Prior Functioning/Environment Level of Independence: Independent        Comments: ADLs and IADLs. No falls in past year reported; pt enjoys going to church; Does not drive and gets rides from sister        OT Problem List: Decreased activity tolerance;Pain      OT Treatment/Interventions:      OT Goals(Current goals can be found in the care plan section) Acute Rehab OT Goals Patient Stated Goal: Be able to breath at night OT Goal Formulation: All assessment and education complete, DC therapy  OT Frequency:     Barriers to D/C:            Co-evaluation              AM-PAC PT "6 Clicks" Daily Activity     Outcome Measure Help from another person eating meals?: None Help from another person taking care of personal grooming?: None Help from another person toileting, which includes using toliet, bedpan, or urinal?: None Help from another person bathing (including washing, rinsing, drying)?: None Help from another person to put on and taking off regular upper body clothing?: None Help from another person to put on and taking off regular lower body clothing?: None 6 Click Score: 24   End of Session Nurse Communication: Mobility status  Activity Tolerance: Patient tolerated treatment well Patient left: in chair;with call bell/phone within reach  OT Visit Diagnosis: Muscle weakness (generalized) (M62.81);Pain Pain - Right/Left: Right Pain - part of body: Ankle and joints of foot                Time: 4469-5072 OT Time Calculation (min): 14 min Charges:  OT General Charges $OT Visit: 1 Visit OT Evaluation $OT Eval Low Complexity: 1 Low G-Codes:     Envi Eagleson MSOT, OTR/L Acute  Rehab Pager: [phone] Office: [place] 11/21/2017, 4:04 PM

## 2017-11-21 NOTE — Plan of Care (Signed)
Nutrition Education Note  RD consulted for nutrition education regarding new onset CHF.  RD provided "Low Sodium Nutrition Therapy" handout from the Academy [employer]. Reviewed patient's dietary recall. Provided examples on ways to decrease sodium intake in diet. Discouraged intake of processed foods and use of salt shaker. Encouraged fresh fruits and vegetables as well as whole grain sources of carbohydrates to maximize fiber intake.   RD discussed why it is important for patient to adhere to diet recommendations, and emphasized the role of fluids, foods to avoid, and importance of weighing self daily. Teach back method used.  Expect fair compliance.  Body mass index is 26.89 kg/m. Pt meets criteria for overweight based on current BMI.  Current diet order is Heart Healthy/ Carb Modified patient is consuming approximately 100% of meals at this time.   RD following for acute nutrition issues; see full assessment from 11/21/17 for additional details. Will continue to follow.  Montoya Watkin A. Jimmye Norman, RD, LDN, CDE Pager: [phone] After hours Pager: [phone]

## 2017-11-21 NOTE — Progress Notes (Signed)
Initial Nutrition Assessment  DOCUMENTATION CODES:   Not applicable  INTERVENTION:   -MVI with minerals daily -RD provided education on low sodium diet; please see education note for further details  NUTRITION DIAGNOSIS:   Increased nutrient needs related to wound healing as evidenced by estimated needs.  GOAL:   Patient will meet greater than or equal to 90% of their needs  MONITOR:   PO intake, Supplement acceptance, Labs, Weight trends, Skin, I & O's  REASON FOR ASSESSMENT:   Consult Assessment of nutrition requirement/status  ASSESSMENT:   Patricia Randall is a 84 y.o. female with medical history significant for atrial fibrillation currently on Coumadin, CHF with EF 30 to 35%, diabetes, hypertension, recent admission for CHF exacerbation discharged on 11/15/2017, now presenting to the emergency department with increasing shortness of breath, worse with lying supine, improved with sitting upright, present since discharge, patient having increased her Lasix to 20 mg twice a day.  Reports having been compliant with her medications, in fact, right prior to presentation, she took 60 mg orally without improvement.  She walks about 10 feet then coming to lay down, due to shortness of breath  Pt admitted with CHF.   Reviewed I/O's: -520 ml x 24 hours  Case discussed with RN, who reports that pt with good appetite, but received in report that pt was not weighing herself at home and consuming a lot of fluids PTA.   Spoke with pt, who was sitting in recliner chair at time of visit. She reports good appetite, consumed most of her breakfast this morning. She usually cooks meals at home for herself, however, has been consuming convenience foods due to to hot weather. Most recently, typical intake is a boiled egg and Glucerna shake for breakfast, peanut butter crackers and diet cola for lunch and dinner. Pt shares that she has also been consuming canned vegetables, bacon and hot dogs lately, but  trying to use more pepper. She avoids many greens and lettuces due to being on coumadin.   Pt endorses wt loss. She shares UBW of around 150#, but suspects more recently that weight loss has been related to diuresis. Pt has experienced a 2.6% wt loss over the past month, which is not significant for time frame.   Both pt and son requested a diet plan in the ED. RD provided extensive education on low sodium diet; see education note for further details.   Last Hgb A1c: 7.6 (09/27/17). PTA DM medications 4 mg glimepiride daily. Pt with fair DM control given advanced age.   Labs reviewed: CBGS: 86-155 (inpatient orders for glycemic control are 0-9 units insulin aspart TID with meals).  NUTRITION - FOCUSED PHYSICAL EXAM:    Most Recent Value  Orbital Region  No depletion  Upper Arm Region  No depletion  Thoracic and Lumbar Region  No depletion  Buccal Region  No depletion  Temple Region  No depletion  Clavicle Bone Region  No depletion  Clavicle and Acromion Bone Region  No depletion  Scapular Bone Region  No depletion  Dorsal Hand  No depletion  Patellar Region  No depletion  Anterior Thigh Region  No depletion  Posterior Calf Region  No depletion  Edema (RD Assessment)  Mild  Hair  Reviewed  Eyes  Reviewed  Mouth  Reviewed  Skin  Reviewed  Nails  Reviewed       Diet Order:   Diet Order           Diet heart healthy/carb modified  Room service appropriate? Yes; Fluid consistency: Thin; Fluid restriction: 1200 mL Fluid  Diet effective now          EDUCATION NEEDS:   Education needs have been addressed  Skin:  Skin Assessment: Skin Integrity Issues: Incisions: rt leg and rt ankle Other: n/a  Last BM:  11/20/17  Height:   Ht Readings from Last 1 Encounters:  11/20/17 5\' 2"  (1.575 m)    Weight:   Wt Readings from Last 1 Encounters:  11/21/17 147 lb (66.7 kg)    Ideal Body Weight:  50 kg  BMI:  Body mass index is 26.89 kg/m.  Estimated Nutritional Needs:    Kcal:  1500-1700  Protein:  75-90 grams  Fluid:  1.2 L    Neave Lenger A. Jimmye Norman, RD, LDN, CDE Pager: [phone] After hours Pager: [phone]

## 2017-11-21 NOTE — Progress Notes (Signed)
Progress Note    Patricia Randall  JJH:[id number] DOB: [birth year]  DOA: 11/20/2017 PCP: Leonard Downing, MD    Brief Narrative:    Medical records reviewed and are as summarized below:  Patricia Randall is an 84 y.o. female with 2 recent admission for CHF.  She states that she is planning on starting to weigh herself and put her weight on the calendar but has not done so yet.  She has gained 2 pounds since discharge from the hospital.  She clearly needs a lot more assistance at home.  Presents with an acute exacerbation of congestive heart failure only days after being discharged from the hospital.  She will be put in observation for some diuresis overnight she will need extensive education and we will need to identify someone who can assist her at home.  Assessment/Plan:   Principal Problem:   Acute on chronic systolic CHF (congestive heart failure) (HCC) Active Problems:   Permanent atrial fibrillation (HCC)   Cardiomyopathy (HCC)   Essential hypertension, benign   CKD (chronic kidney disease), stage III (HCC)   Protein-calorie malnutrition, severe   Type II diabetes mellitus (HCC)   Ulcer of right lower extremity (HCC)   CHF exacerbation (HCC)  Acute on chronic  systolic   CHF  -history of progressive shortness of breath, with orthopnea, without chest pain, fever or chills, since her last admission to the hospital discharge on 11/15/2017.   - notices increasing edema, and a 2 pound weight gain since discharge.   -recent echo 30-35% with global hypokinesis  -IV Lasix 40 mg twice daily -monitor I/Os and daily weights -cardiology eval-- needs close outpatient follow up -? Defer need for limited echo to cards? -dietician for salt education  Chronic Atrial fibrillation, on chronic Coumadin. CHADSVASC 5 Continue Coumadin- pharmacy to dose -on coreg-- rate not controlled on tele currently  Chronic kidney disease stage III -monitor Cr closely while diuresing   Type II Diabetes    Hold home oral diabetic medications.   SSI  Right leg ulcer, the patient follows with wound care as an outpatient  -diminished pulses -ABI was planned as an outpatient-- will get while here   Poor nutritional status.   -nutritional consultation.      Body mass index is 26.89 kg/m.   Family Communication/Anticipated D/C date and plan/Code Status   DVT prophylaxis: warfarin Code Status: Full Code.  Family Communication:  Disposition Plan:    Medical Consultants:   cards  Subjective:   Breathing better today   Objective:    Vitals:   11/21/17 0359 11/21/17 0408 11/21/17 1021 11/21/17 1059  BP:  (!) 146/110 116/84 118/89  Pulse:  (!) 113 (!) 110   Resp:  (!) 22    Temp:  97.6 F (36.4 C)    TempSrc:  Oral    SpO2:  100% 100% 100%  Weight: 66.7 kg (147 lb)     Height:        Intake/Output Summary (Last 24 hours) at 11/21/2017 1239 Last data filed at 11/21/2017 0400 Gross per 24 hour  Intake 480 ml  Output 1000 ml  Net -520 ml   Filed Weights   11/20/17 1107 11/20/17 1813 11/21/17 0359  Weight: 67.6 kg (149 lb) 66.9 kg (147 lb 8 oz) 66.7 kg (147 lb)    Exam: Pleasant/NAD irr and fast-- about 120 No increased work of breathing Right leg wrapped with difficult to find pulse No LE edema Normal mood and affect  Data Reviewed:   I have personally reviewed following labs and imaging studies:  Labs: Labs show the following:   Basic Metabolic Panel: Recent Labs  Lab 11/15/17 0622 11/20/17 1117 11/21/17 0449  NA 145 139 141  K 3.5 3.9 4.0  CL 106 108 107  CO2 29 22 25   GLUCOSE 128* 171* 135*  BUN 39* 47* 48*  CREATININE 2.00* 1.87* 1.89*  CALCIUM 9.3 8.6* 9.0   GFR Estimated Creatinine Clearance: 19.1 mL/min (A) (by C-G formula based on SCr of 1.89 mg/dL (H)). Liver Function Tests: No results for input(s): AST, ALT, ALKPHOS, BILITOT, PROT, ALBUMIN in the last 168 hours. No results for input(s): LIPASE, AMYLASE in the last 168  hours. No results for input(s): AMMONIA in the last 168 hours. Coagulation profile Recent Labs  Lab 11/15/17 0622 11/20/17 1236 11/21/17 0449  INR 1.81 1.82 1.66    CBC: Recent Labs  Lab 11/20/17 1117 11/21/17 0449  WBC 3.8* 4.5  HGB 12.9 14.3  HCT 40.0 44.3  MCV 83.7 84.4  PLT 156 163   Cardiac Enzymes: No results for input(s): CKTOTAL, CKMB, CKMBINDEX, TROPONINI in the last 168 hours. BNP (last 3 results) No results for input(s): PROBNP in the last 8760 hours. CBG: Recent Labs  Lab 11/15/17 1241 11/20/17 1843 11/20/17 2130 11/21/17 0758 11/21/17 1119  GLUCAP 156* 86 155* 114* 163*   D-Dimer: No results for input(s): DDIMER in the last 72 hours. Hgb A1c: No results for input(s): HGBA1C in the last 72 hours. Lipid Profile: No results for input(s): CHOL, HDL, LDLCALC, TRIG, CHOLHDL, LDLDIRECT in the last 72 hours. Thyroid function studies: No results for input(s): TSH, T4TOTAL, T3FREE, THYROIDAB in the last 72 hours.  Invalid input(s): FREET3 Anemia work up: No results for input(s): VITAMINB12, FOLATE, FERRITIN, TIBC, IRON, RETICCTPCT in the last 72 hours. Sepsis Labs: Recent Labs  Lab 11/20/17 1117 11/21/17 0449  WBC 3.8* 4.5    Microbiology No results found for this or any previous visit (from the past 240 hour(s)).  Procedures and diagnostic studies:  Dg Chest 2 View  Result Date: 11/20/2017 CLINICAL DATA:  Shortness of breath, worse at night and when lying down. History of CHF. EXAM: CHEST - 2 VIEW COMPARISON:  Chest x-ray dated 11/13/2017, chest x-ray dated 07/05/2017 FINDINGS: Stable cardiomegaly. Lungs are clear. No evidence of active CHF at this time. No confluent opacity to suggest a developing pneumonia. No pleural effusion or pneumothorax seen. No acute or suspicious osseous finding. IMPRESSION: No active cardiopulmonary disease. No evidence of pneumonia or pulmonary edema. Stable cardiomegaly. Electronically Signed   By: Franki Cabot M.D.    On: 11/20/2017 11:33    Medications:   . carvedilol  9.375 mg Oral BID WC  . cholecalciferol  1,000 Units Oral Daily  . feeding supplement (GLUCERNA SHAKE)  237 mL Oral Q24H  . furosemide  40 mg Intravenous BID  . insulin aspart  0-9 Units Subcutaneous TID WC  . latanoprost  1 drop Both Eyes QHS  . pravastatin  40 mg Oral q1800  . silver sulfADIAZINE   Topical Daily  . warfarin  5 mg Oral ONCE-1800  . Warfarin - Pharmacist Dosing Inpatient   Does not apply q1800   Continuous Infusions:   LOS: 0 days   Geradine Girt  [facility]   *Please refer to [email or web address], password [email or web address] to get updated schedule on who will round on this patient, as hospitalists switch teams weekly. If 7PM-7AM, please contact night-coverage at [email or web address],  password [email or web address] for any overnight needs.  11/21/2017, 12:39 PM

## 2017-11-21 NOTE — Progress Notes (Signed)
ANTICOAGULATION CONSULT NOTE   Pharmacy Consult for warfarin Indication: Atrial fibrilation  No Known Allergies  Patient Measurements: Height: 5\' 2"  (157.5 cm) Weight: 147 lb (66.7 kg) IBW/kg (Calculated) : 50.1   Vital Signs: Temp: 97.6 F (36.4 C) (06/03 0408) Temp Source: Oral (06/03 0408) BP: 146/110 (06/03 0408) Pulse Rate: 113 (06/03 0408)  Labs: Recent Labs    11/20/17 1117 11/20/17 1236 11/21/17 0449  HGB 12.9  --  14.3  HCT 40.0  --  44.3  PLT 156  --  163  LABPROT  --  20.9* 19.5*  INR  --  1.82 1.66  CREATININE 1.87*  --  1.89*    Estimated Creatinine Clearance: 19.1 mL/min (A) (by C-G formula based on SCr of 1.89 mg/dL (H)).   Medical History: Past Medical History:  Diagnosis Date  . Atrial fibrillation ([place])    a. 03/2013 s/p TEE/DCCV;  b. 03/2013 Eliquis initiated.  . Chronic systolic CHF (congestive heart failure) ([place])    a. 03/2013 Echo: EF 20-25%, diff HK, mild to mod MR, sev dil LA, mild RV dysfxn, sev dil RA, mod TR.  . Claudication ([place])   . Glaucoma   . High cholesterol   . Hypertension   . Type II diabetes mellitus (HCC)     Assessment: 84 yo female on warfarin 4mg  x 2 days, then 2mg  x 2 days and repeat PTA for Afib. (Last dose PTA 6/1) INR remains subtheapeutic 1.66. CBC stable, WNL  MD would like to avoid bridge therapy at this time. Pharmacy will monitor INR closely.  Goal of Therapy:  INR 2-3 Monitor platelets by anticoagulation protocol: Yes   Plan:  Warfarin 5mg  x1 tonight Monitor daily INR, CBC, s/s of bleed  Georga Bora, PharmD Clinical Pharmacist 11/21/2017 8:35 AM

## 2017-11-21 NOTE — Consult Note (Signed)
Cardiology Consultation:   Patient ID: Patricia Randall; [id number]; 04/14/1929   Admit date: 11/20/2017 Date of Consult: 11/21/2017  Primary Care Provider: Leonard Downing, MD Primary Cardiologist: Glori Bickers, MD Primary Electrophysiologist:     Patient Profile:   Patricia Randall is a 84 y.o. female with a hx of atrial fibrillation on coumadin, chronic systolic heart failure with EF of 30-35%, CKD stage III, DM, HTN, HLD, and claudication who is being seen today for the evaluation of worsening SOB and orthopnea at the request of Dr. Eliseo Squires.  History of Present Illness:   [name] has a complex cardiac history as above. She was recently admitted to [facility] 11/13/17-11/15/17 for acute on chronic combined systolic and diastolic heart failure. She was admitted 09/2017 for acute on chronic systolic heart failure, acute on chronic KI and right lower extremity cellulitis. She was treated and discharged and was doing well until she noticed and worsening SOB and orthopnea prompting her to return to the ED 11/13/17. She was diuresed and discharged with follow up with would care for right leg ulcer, INR follow up, and cardiology follow up. She was discharged on 20 mg lasix BID. Cardiology was not consulted during that hospitalization. Unfortunately, she was only home for 5 days before experiencing increased SOB with orthopnea. She states she felt well for a few days after her discharge, but then started feeling short of breath again.  She presented to [facility] for further treatment.   On arrival, BNP was elevated to 1151 with a sCr of  1.87. EKG with Afib. She was admitted to hospitalist service. Cardiology was consulted for management of heart failure.   On my interview, she sounds a bit unclear on how she was taking her lasix since her last discharge. She did not understand the concept of daily weights and when to call the office. She is very concerned about her right lower extremity ulcers. She has been compliant on  her medications to the best of her knowledge. She denies taking extra lasix prior to this ED visit. Per records, she was taking 20 mg lasix BID since last discharge. She reports orthopnea on the evening prior to her admission. She reports sitting on the side of the bed until daylight when she presented to the ED. She denies chest pain, palpitations, dizziness, and syncope.    Past Medical History:  Diagnosis Date  . Atrial fibrillation ([place])    a. 03/2013 s/p TEE/DCCV;  b. 03/2013 Eliquis initiated.  . Chronic systolic CHF (congestive heart failure) ([place])    a. 03/2013 Echo: EF 20-25%, diff HK, mild to mod MR, sev dil LA, mild RV dysfxn, sev dil RA, mod TR.  . Claudication ([place])   . Glaucoma   . High cholesterol   . Hypertension   . Type II diabetes mellitus ([place])     Past Surgical History:  Procedure Laterality Date  . CARDIOVERSION N/A 04/02/2013   Procedure: CARDIOVERSION;  Surgeon: Thayer Headings, MD;  Location: [place];  Service: Cardiovascular;  Laterality: N/A;  Spoke with Tom   . ESOPHAGOGASTRODUODENOSCOPY N/A 05/26/2016   Procedure: ESOPHAGOGASTRODUODENOSCOPY (EGD);  Surgeon: Ladene Artist, MD;  Location: [facility] ENDOSCOPY;  Service: Endoscopy;  Laterality: N/A;  . EYE SURGERY Right    "had blood in it"   . TEE WITHOUT CARDIOVERSION N/A 04/02/2013   Procedure: TRANSESOPHAGEAL ECHOCARDIOGRAM (TEE);  Surgeon: Thayer Headings, MD;  Location: [facility] ENDOSCOPY;  Service: Cardiovascular;  Laterality: N/A;     Home Medications:  Prior  to Admission medications   Medication Sig Start Date End Date Taking? Authorizing Provider  carvedilol (COREG) 3.125 MG tablet Take 3 tablets (9.375 mg total) by mouth 2 (two) times daily with a meal. 11/15/17  Yes Emokpae, Courage, MD  cholecalciferol (VITAMIN D) 1000 units tablet Take 1,000 Units by mouth daily.   Yes [provider]  feeding supplement, GLUCERNA SHAKE, (GLUCERNA SHAKE) LIQD Take 237 mLs by mouth daily.   Yes [provider]  furosemide (LASIX) 20 MG tablet Take 1 tablet (20 mg total) by mouth 2 (two) times daily at 10 am and 4 pm. 11/15/17 11/15/18 Yes Emokpae, Courage, MD  glimepiride (AMARYL) 4 MG tablet Take 1 tablet (4 mg total) by mouth daily before breakfast. 11/15/17  Yes Emokpae, Courage, MD  latanoprost (XALATAN) 0.005 % ophthalmic solution Place 1 drop into both eyes at bedtime.   Yes [provider]  lovastatin (MEVACOR) 40 MG tablet Take 1 tablet (40 mg total) by mouth at bedtime. 11/15/17  Yes Emokpae, Courage, MD  Multiple Vitamins-Minerals (HAIR/SKIN/NAILS/BIOTIN PO) Take 1 tablet by mouth daily.   Yes [provider]  polyethylene glycol (MIRALAX / GLYCOLAX) packet Take 17 g by mouth daily as needed for mild constipation. 09/29/17  Yes Lavina Hamman, MD  warfarin (COUMADIN) 2 MG tablet Take 1 tablet (2 mg total) by mouth daily. Patient taking differently: Take 2-4 mg by mouth See admin instructions. Take 2 tablets (4mg ) by mouth daily after breakfast for two days then 1 tablet (2mg ) next day after breakfast, then repeat (2,2,1) 05/31/16  Yes Nita Sells, MD  silver sulfADIAZINE (SILVADENE) 1 % cream Apply to affected area daily Patient not taking: Reported on 11/13/2017 09/30/17 09/30/18  Lavina Hamman, MD    Inpatient Medications: Scheduled Meds: . carvedilol  9.375 mg Oral BID WC  . cholecalciferol  1,000 Units Oral Daily  . feeding supplement (GLUCERNA SHAKE)  237 mL Oral Q24H  . insulin aspart  0-9 Units Subcutaneous TID WC  . latanoprost  1 drop Both Eyes QHS  . pravastatin  40 mg Oral q1800  . silver sulfADIAZINE   Topical Daily  . warfarin  5 mg Oral ONCE-1800  . Warfarin - Pharmacist Dosing Inpatient   Does not apply q1800   Continuous Infusions:  PRN Meds: acetaminophen **OR** acetaminophen, bisacodyl, HYDROcodone-acetaminophen, ondansetron **OR** ondansetron (ZOFRAN) IV, polyethylene glycol  Allergies:   No Known Allergies  Social History:     Social History   Socioeconomic History  . Marital status: Widowed    Spouse name: Not on file  . Number of children: Not on file  . Years of education: Not on file  . Highest education level: Not on file  Occupational History  . Not on file  Social Needs  . Financial resource strain: Not on file  . Food insecurity:    Worry: Not on file    Inability: Not on file  . Transportation needs:    Medical: Not on file    Non-medical: Not on file  Tobacco Use  . Smoking status: Never Smoker  . Smokeless tobacco: Never Used  Substance and Sexual Activity  . Alcohol use: No  . Drug use: No  . Sexual activity: Never  Lifestyle  . Physical activity:    Days per week: Not on file    Minutes per session: Not on file  . Stress: Not on file  Relationships  . Social connections:    Talks on phone: Not on file  Gets together: Not on file    Attends religious service: Not on file    Active member of club or organization: Not on file    Attends meetings of clubs or organizations: Not on file    Relationship status: Not on file  . Intimate partner violence:    Fear of current or ex partner: Not on file    Emotionally abused: Not on file    Physically abused: Not on file    Forced sexual activity: Not on file  Other Topics Concern  . Not on file  Social History Narrative   Lives in [place] with her son.    Family History:    Family History  Problem Relation Age of Onset  . Other Mother        died in her 47's - 'just got sick.'  . Other Father        died @ 32, unknown cause.  . Diabetes Brother   . Other Unknown        12 siblings + 7 more 1/2 siblings.     ROS:  Please see the history of present illness.   All other ROS reviewed and negative.     Physical Exam/Data:   Vitals:   11/21/17 0359 11/21/17 0408 11/21/17 1021 11/21/17 1059  BP:  (!) 146/110 116/84 118/89  Pulse:  (!) 113 (!) 110   Resp:  (!) 22    Temp:  97.6 F (36.4 C)    TempSrc:  Oral    SpO2:   100% 100% 100%  Weight: 147 lb (66.7 kg)     Height:        Intake/Output Summary (Last 24 hours) at 11/21/2017 1112 Last data filed at 11/21/2017 0400 Gross per 24 hour  Intake 480 ml  Output 1000 ml  Net -520 ml   Filed Weights   11/20/17 1107 11/20/17 1813 11/21/17 0359  Weight: 149 lb (67.6 kg) 147 lb 8 oz (66.9 kg) 147 lb (66.7 kg)   Body mass index is 26.89 kg/m.  General:  Well nourished, well developed, in no acute distress HEENT: normal Neck: no JVD Vascular: No carotid bruits Cardiac:  Irregular rhythm, regular rate Lungs:  clear to auscultation bilaterally, no wheezing, rhonchi or rales  Abd: soft, nontender, no hepatomegaly  Ext: left lower extremity edema, good pulse on left, right leg wrapped for ulcers, pedal pulse is faint Musculoskeletal:  No deformities, BUE and BLE strength normal and equal Skin: warm and dry  Neuro:  CNs 2-12 intact, no focal abnormalities noted Psych:  Normal affect   EKG:  The EKG was personally reviewed and demonstrates:  Atrial fibrillation  Telemetry:  Telemetry was personally reviewed and demonstrates:  Afib, rate controlled  Relevant CV Studies:  Echo 09/27/17: Study Conclusions - Left ventricle: The cavity size was normal. Wall thickness was   normal. Systolic function was moderately to severely reduced. The   estimated ejection fraction was in the range of 30% to 35%.   Diffuse hypokinesis. The study is not technically sufficient to   allow evaluation of LV diastolic function. - Mitral valve: Mildly thickened leaflets . There was moderate   regurgitation. - Left atrium: Moderately dilated. - Right atrium: The atrium was at the upper limits of normal in   size. - Tricuspid valve: There was moderate regurgitation. - Pulmonary arteries: PA peak pressure: 42 mm Hg (S). - Inferior vena cava: The vessel was normal in size. The   respirophasic diameter changes were  in the normal range (>= 50%),   consistent with normal central  venous pressure.  Impressions: - Compared to a prior study in 2015, the LVEF has decreased to   30-35% with global hypokinesis - atrial fibrillation is present.   There is moderate LAE.  Laboratory Data:  Chemistry Recent Labs  Lab 11/15/17 0622 11/20/17 1117 11/21/17 0449  NA 145 139 141  K 3.5 3.9 4.0  CL 106 108 107  CO2 29 22 25   GLUCOSE 128* 171* 135*  BUN 39* 47* 48*  CREATININE 2.00* 1.87* 1.89*  CALCIUM 9.3 8.6* 9.0  GFRNONAA 21* 23* 23*  GFRAA 25* 27* 27*  ANIONGAP 10 9 9     No results for input(s): PROT, ALBUMIN, AST, ALT, ALKPHOS, BILITOT in the last 168 hours. Hematology Recent Labs  Lab 11/20/17 1117 11/21/17 0449  WBC 3.8* 4.5  RBC 4.78 5.25*  HGB 12.9 14.3  HCT 40.0 44.3  MCV 83.7 84.4  MCH 27.0 27.2  MCHC 32.3 32.3  RDW 18.6* 19.5*  PLT 156 163   Cardiac EnzymesNo results for input(s): TROPONINI in the last 168 hours.  Recent Labs  Lab 11/20/17 1129  TROPIPOC 0.01    BNP Recent Labs  Lab 11/20/17 1216  BNP 1,151.7*    DDimer No results for input(s): DDIMER in the last 168 hours.  Radiology/Studies:  Dg Chest 2 View  Result Date: 11/20/2017 CLINICAL DATA:  Shortness of breath, worse at night and when lying down. History of CHF. EXAM: CHEST - 2 VIEW COMPARISON:  Chest x-ray dated 11/13/2017, chest x-ray dated 07/05/2017 FINDINGS: Stable cardiomegaly. Lungs are clear. No evidence of active CHF at this time. No confluent opacity to suggest a developing pneumonia. No pleural effusion or pneumothorax seen. No acute or suspicious osseous finding. IMPRESSION: No active cardiopulmonary disease. No evidence of pneumonia or pulmonary edema. Stable cardiomegaly. Electronically Signed   By: Franki Cabot M.D.   On: 11/20/2017 11:33    Assessment and Plan:   1.  Acute on chronic systolic heart failure - pt was just discharged from hospital 11/15/17 for same, discharged on 20 mg lasix BID - she returns with lower extremity swelling and orthopnea, she  was SOB on walking to the bathroom - BNP was elevated to 1151 with a sCr 1.87 - EKG with Afib - she was given one dose of 40 mg IV lasix yesterday - she is overall net negative 500 cc, weight is 147 from 149 lbs, dry weight is difficult to determine (was 151 lbs in clinic with B. Simmons on 10/25/17) - will resume 40 mg IV lasix BID, with close follow up of her renal function - continue coreg - pressures have been labile - ACEI was previously discontinued for worsening renal function - BP may limit heart failure medication titration - she has not seen Dr. Haroldine Laws in heart failure clinic since 2015 - we had a long discussion about daily weights, salt restriction, and when to call the office   2. CKD stage III - sCr on admission 1.87 - baseline appears to be 1.6-1.8 - daily BMP, will replace K as needed   3. Atrial fibrillation - no intervention planned for now - INR today 1.66, continue coumadin per pharmacy   4. Right lower extremity chronic ulcers - she follows with wound care in [place] - will consider lower extremity dopplers while inpatient - discuss with attending - pulse feels weak on her right    For questions or updates, please contact [facility] Please consult [email or web address] for contact info under Cardiology/STEMI.   Signed, Tami Lin Duke, PA  11/21/2017 11:12 AM

## 2017-11-22 ENCOUNTER — Observation Stay (HOSPITAL_COMMUNITY): Payer: Medicare Other

## 2017-11-22 DIAGNOSIS — E1122 Type 2 diabetes mellitus with diabetic chronic kidney disease: Secondary | ICD-10-CM | POA: Diagnosis not present

## 2017-11-22 DIAGNOSIS — I482 Chronic atrial fibrillation: Secondary | ICD-10-CM | POA: Diagnosis not present

## 2017-11-22 DIAGNOSIS — I10 Essential (primary) hypertension: Secondary | ICD-10-CM | POA: Diagnosis not present

## 2017-11-22 DIAGNOSIS — I5023 Acute on chronic systolic (congestive) heart failure: Secondary | ICD-10-CM | POA: Diagnosis not present

## 2017-11-22 DIAGNOSIS — N183 Chronic kidney disease, stage 3 (moderate): Secondary | ICD-10-CM | POA: Diagnosis not present

## 2017-11-22 LAB — GLUCOSE, CAPILLARY
GLUCOSE-CAPILLARY: 178 mg/dL — AB (ref 65–99)
GLUCOSE-CAPILLARY: 147 mg/dL — AB (ref 65–99)
Glucose-Capillary: 124 mg/dL — ABNORMAL HIGH (ref 65–99)
Glucose-Capillary: 160 mg/dL — ABNORMAL HIGH (ref 65–99)

## 2017-11-22 LAB — BASIC METABOLIC PANEL
Anion gap: 12 (ref 5–15)
BUN: 45 mg/dL — AB (ref 6–20)
CHLORIDE: 105 mmol/L (ref 101–111)
CO2: 27 mmol/L (ref 22–32)
CREATININE: 1.86 mg/dL — AB (ref 0.44–1.00)
Calcium: 9.1 mg/dL (ref 8.9–10.3)
GFR calc Af Amer: 27 mL/min — ABNORMAL LOW (ref >60)
GFR calc non Af Amer: 23 mL/min — ABNORMAL LOW (ref >60)
Glucose, Bld: 126 mg/dL — ABNORMAL HIGH (ref 65–99)
Potassium: 3.7 mmol/L (ref 3.5–5.1)
Sodium: 144 mmol/L (ref 135–145)

## 2017-11-22 LAB — PROTIME-INR
INR: 1.82
Prothrombin Time: 20.9 seconds — ABNORMAL HIGH (ref 11.4–15.2)

## 2017-11-22 MED ORDER — FUROSEMIDE 40 MG PO TABS
40.0000 mg | ORAL_TABLET | Freq: Two times a day (BID) | ORAL | Status: DC
  Administered 2017-11-23 (×2): 40 mg via ORAL
  Filled 2017-11-22 (×2): qty 1

## 2017-11-22 MED ORDER — METOPROLOL SUCCINATE ER 25 MG PO TB24
25.0000 mg | ORAL_TABLET | Freq: Two times a day (BID) | ORAL | Status: DC
  Administered 2017-11-22 – 2017-11-23 (×2): 25 mg via ORAL
  Filled 2017-11-22 (×2): qty 1

## 2017-11-22 MED ORDER — WARFARIN SODIUM 2 MG PO TABS
4.0000 mg | ORAL_TABLET | Freq: Once | ORAL | Status: AC
  Administered 2017-11-22: 4 mg via ORAL
  Filled 2017-11-22: qty 2

## 2017-11-22 NOTE — Progress Notes (Signed)
ANTICOAGULATION CONSULT NOTE   Pharmacy Consult for warfarin Indication: Atrial fibrilation  No Known Allergies  Patient Measurements: Height: 5\' 2"  (157.5 cm) Weight: 143 lb 8 oz (65.1 kg) IBW/kg (Calculated) : 50.1   Vital Signs: Temp: 97.3 F (36.3 C) (06/04 0801) Temp Source: Oral (06/04 0801) BP: 118/80 (06/04 0801) Pulse Rate: 104 (06/04 0801)  Labs: Recent Labs    11/20/17 1117 11/20/17 1236 11/21/17 0449 11/22/17 0552  HGB 12.9  --  14.3  --   HCT 40.0  --  44.3  --   PLT 156  --  163  --   LABPROT  --  20.9* 19.5* 20.9*  INR  --  1.82 1.66 1.82  CREATININE 1.87*  --  1.89* 1.86*    Estimated Creatinine Clearance: 19.2 mL/min (A) (by C-G formula based on SCr of 1.86 mg/dL (H)).   Medical History: Past Medical History:  Diagnosis Date  . Atrial fibrillation ([place])    a. 03/2013 s/p TEE/DCCV;  b. 03/2013 Eliquis initiated.  . Chronic systolic CHF (congestive heart failure) ([place])    a. 03/2013 Echo: EF 20-25%, diff HK, mild to mod MR, sev dil LA, mild RV dysfxn, sev dil RA, mod TR.  . Claudication ([place])   . Glaucoma   . High cholesterol   . Hypertension   . Type II diabetes mellitus ([facility])     Assessment: 84 yo female on warfarin 4mg  x 2 days, then 2mg  x 2 days and repeat PTA for Afib. (Last dose PTA 6/1) INR up slightly, but remains subtheapeutic 1.82. CBC stable, WNL  MD would like to avoid bridge therapy at this time. Pharmacy will monitor INR closely.  Goal of Therapy:  INR 2-3 Monitor platelets by anticoagulation protocol: Yes   Plan:  Warfarin 4mg  x1 tonight Monitor daily INR, CBC, s/s of bleed  Georga Bora, PharmD Clinical Pharmacist 11/22/2017 8:29 AM

## 2017-11-22 NOTE — Progress Notes (Signed)
Progress Note    Patricia Randall  YIF:[id number] DOB: September 19, 1928  DOA: 11/20/2017 PCP: Leonard Downing, MD    Brief Narrative:    Medical records reviewed and are as summarized below:  Patricia Randall is an 84 y.o. female with 2 recent admission for CHF.  She states that she is planning on starting to weigh herself and put her weight on the calendar but has not done so yet.  She has gained 2 pounds since discharge from the hospital.  She clearly needs a lot more assistance at home.  Presents with an acute exacerbation of congestive heart failure only days after being discharged from the hospital. Appears to not understand what she needs to do at home regarding diet and daily weight.    Assessment/Plan:   Principal Problem:   Acute on chronic systolic CHF (congestive heart failure) (HCC) Active Problems:   Permanent atrial fibrillation (H[facility])   Cardiomyopathy (H[facility])   Essential hypertension, benign   CKD (chronic kidney disease), stage III (H[facility])   Protein-calorie malnutrition, severe   Type II diabetes mellitus ([place])   Ulcer of right lower extremity (HCC)   CHF exacerbation ([facility])  Acute on chronic  systolic   CHF  -history of progressive shortness of breath, with orthopnea, without chest pain, fever or chills, since her last admission to the hospital discharge on 11/15/2017.   - notices increasing edema, and a 2 pound weight gain since discharge.   -recent echo 30-35% with global hypokinesis  -per cards- 1 more dose of IV lasix then change to PO BID 40 mg (up from 20 mg BID) -monitor I/Os and daily weights -dietician-- need further education  Chronic Atrial fibrillation, on chronic Coumadin. CHADSVASC 5 Continue Coumadin- pharmacy to dose -on coreg-- rate not controlled on tele currently- cards to change to PO metoprolol  Chronic kidney disease stage III -monitor Cr closely while diuresing   Type II Diabetes  Hold home oral diabetic medications.   SSI  Right leg ulcer,  the patient follows with wound care as an outpatient  -diminished pulses -ABI was planned as an outpatient-- will get while here   Poor nutritional status.   -nutritional consultation.       Family Communication/Anticipated D/C date and plan/Code Status   DVT prophylaxis: warfarin Code Status: Full Code.  Family Communication: none at bedside Disposition Plan:    Medical Consultants:   cards  Subjective:   No overnight events-- nursing reports patient got short of breathing washing face this AM  Objective:    Vitals:   11/22/17 0801 11/22/17 0849 11/22/17 1023 11/22/17 1235  BP: 118/80 110/87  (!) 103/94  Pulse: (!) 104 (!) 109  77  Resp:  18  18  Temp: (!) 97.3 F (36.3 C)   97.7 F (36.5 C)  TempSrc: Oral   Oral  SpO2: 100% 100% 98% 100%  Weight:      Height:        Intake/Output Summary (Last 24 hours) at 11/22/2017 1438 Last data filed at 11/22/2017 1045 Gross per 24 hour  Intake 800 ml  Output 2200 ml  Net -1400 ml   Filed Weights   11/20/17 1813 11/21/17 0359 11/22/17 0414  Weight: 66.9 kg (147 lb 8 oz) 66.7 kg (147 lb) 65.1 kg (143 lb 8 oz)    Exam: In bed, NAD rrr Diminished but no wheezing Foot wrapped   Data Reviewed:   I have personally reviewed following labs and imaging studies:  Labs:  Labs show the following:   Basic Metabolic Panel: Recent Labs  Lab 11/20/17 1117 11/21/17 0449 11/22/17 0552  NA 139 141 144  K 3.9 4.0 3.7  CL 108 107 105  CO2 22 25 27   GLUCOSE 171* 135* 126*  BUN 47* 48* 45*  CREATININE 1.87* 1.89* 1.86*  CALCIUM 8.6* 9.0 9.1   GFR Estimated Creatinine Clearance: 19.2 mL/min (A) (by C-G formula based on SCr of 1.86 mg/dL (H)). Liver Function Tests: No results for input(s): AST, ALT, ALKPHOS, BILITOT, PROT, ALBUMIN in the last 168 hours. No results for input(s): LIPASE, AMYLASE in the last 168 hours. No results for input(s): AMMONIA in the last 168 hours. Coagulation profile Recent Labs  Lab  11/20/17 1236 11/21/17 0449 11/22/17 0552  INR 1.82 1.66 1.82    CBC: Recent Labs  Lab 11/20/17 1117 11/21/17 0449  WBC 3.8* 4.5  HGB 12.9 14.3  HCT 40.0 44.3  MCV 83.7 84.4  PLT 156 163   Cardiac Enzymes: No results for input(s): CKTOTAL, CKMB, CKMBINDEX, TROPONINI in the last 168 hours. BNP (last 3 results) No results for input(s): PROBNP in the last 8760 hours. CBG: Recent Labs  Lab 11/21/17 1119 11/21/17 1717 11/21/17 2147 11/22/17 0757 11/22/17 1127  GLUCAP 163* 115* 190* 124* 178*   D-Dimer: No results for input(s): DDIMER in the last 72 hours. Hgb A1c: No results for input(s): HGBA1C in the last 72 hours. Lipid Profile: No results for input(s): CHOL, HDL, LDLCALC, TRIG, CHOLHDL, LDLDIRECT in the last 72 hours. Thyroid function studies: No results for input(s): TSH, T4TOTAL, T3FREE, THYROIDAB in the last 72 hours.  Invalid input(s): FREET3 Anemia work up: No results for input(s): VITAMINB12, FOLATE, FERRITIN, TIBC, IRON, RETICCTPCT in the last 72 hours. Sepsis Labs: Recent Labs  Lab 11/20/17 1117 11/21/17 0449  WBC 3.8* 4.5    Microbiology No results found for this or any previous visit (from the past 240 hour(s)).  Procedures and diagnostic studies:  No results found.  Medications:   . cholecalciferol  1,000 Units Oral Daily  . feeding supplement (GLUCERNA SHAKE)  237 mL Oral Q24H  . furosemide  40 mg Intravenous BID  . [START ON 11/23/2017] furosemide  40 mg Oral BID  . insulin aspart  0-9 Units Subcutaneous TID WC  . latanoprost  1 drop Both Eyes QHS  . metoprolol succinate  25 mg Oral BID  . pravastatin  40 mg Oral q1800  . silver sulfADIAZINE   Topical Daily  . warfarin  4 mg Oral ONCE-1800  . Warfarin - Pharmacist Dosing Inpatient   Does not apply q1800   Continuous Infusions:   LOS: 0 days   Geradine Girt  [facility]   *Please refer to [email or web address], password [email or web address] to get updated schedule on who will round on this  patient, as hospitalists switch teams weekly. If 7PM-7AM, please contact night-coverage at [email or web address], password [email or web address] for any overnight needs.  11/22/2017, 2:38 PM

## 2017-11-22 NOTE — Progress Notes (Signed)
Brief Nutrition Education Note  RD received consult for diet education.   Education was completed with pt on 11/21/17; please see education note for further details. Education session was focused on importance of weighing self and how to decrease sodium in diet. "Low Sodium Nutrition Therapy" and "Sodium Free Seasoning Tips" handouts provided at previous visit.   Pt also being followed for acute nutrition issues (see initial nutrition assessment on 11/21/17). RD will continue to follow.   If further nutrition education needs arise, consider outpatient referral to [facility] for additional reinforcement.   Zenna Traister A. Jimmye Norman, RD, LDN, CDE Pager: [phone] After hours Pager: [phone]

## 2017-11-22 NOTE — Progress Notes (Signed)
Progress Note  Patient Name: Patricia Randall Date of Encounter: 11/22/2017  Primary Cardiologist: Glori Bickers, MD   Subjective   Pt is resting in bed almost flat. She slept well last night with out orthopnea or PND. Breathing is almost back to normal. She did have mild shortness of breath and fatigue when she got up to the sink to wash up.   Inpatient Medications    Scheduled Meds: . carvedilol  9.375 mg Oral BID WC  . cholecalciferol  1,000 Units Oral Daily  . feeding supplement (GLUCERNA SHAKE)  237 mL Oral Q24H  . furosemide  40 mg Intravenous BID  . insulin aspart  0-9 Units Subcutaneous TID WC  . latanoprost  1 drop Both Eyes QHS  . pravastatin  40 mg Oral q1800  . silver sulfADIAZINE   Topical Daily  . warfarin  4 mg Oral ONCE-1800  . Warfarin - Pharmacist Dosing Inpatient   Does not apply q1800   Continuous Infusions:  PRN Meds: acetaminophen **OR** acetaminophen, bisacodyl, HYDROcodone-acetaminophen, ondansetron **OR** ondansetron (ZOFRAN) IV, polyethylene glycol   Vital Signs    Vitals:   11/22/17 0414 11/22/17 0417 11/22/17 0801 11/22/17 0849  BP:  (!) 131/98 118/80 110/87  Pulse:  94 (!) 104 (!) 109  Resp:    18  Temp:  97.8 F (36.6 C) (!) 97.3 F (36.3 C)   TempSrc:   Oral   SpO2:  100% 100% 100%  Weight: 143 lb 8 oz (65.1 kg)     Height:        Intake/Output Summary (Last 24 hours) at 11/22/2017 0926 Last data filed at 11/22/2017 0850 Gross per 24 hour  Intake 800 ml  Output 1850 ml  Net -1050 ml   Filed Weights   11/20/17 1813 11/21/17 0359 11/22/17 0414  Weight: 147 lb 8 oz (66.9 kg) 147 lb (66.7 kg) 143 lb 8 oz (65.1 kg)    Telemetry    Atrial fibrillation 90's-low 100's - Personally Reviewed  ECG    No new tracings - Personally Reviewed  Physical Exam   GEN: Frail, elderly female. No acute distress.   Neck: No JVD Cardiac: Irregualrly irregular rhythm, no murmurs, rubs, or gallops.  Respiratory: Clear to auscultation  bilaterally. GI: Soft, nontender, non-distended  MS: Trace RLE edema; Dressing in place to Right lower leg/foot.  Neuro:  Nonfocal  Psych: Normal affect   Labs    Chemistry Recent Labs  Lab 11/20/17 1117 11/21/17 0449 11/22/17 0552  NA 139 141 144  K 3.9 4.0 3.7  CL 108 107 105  CO2 22 25 27   GLUCOSE 171* 135* 126*  BUN 47* 48* 45*  CREATININE 1.87* 1.89* 1.86*  CALCIUM 8.6* 9.0 9.1  GFRNONAA 23* 23* 23*  GFRAA 27* 27* 27*  ANIONGAP 9 9 12      Hematology Recent Labs  Lab 11/20/17 1117 11/21/17 0449  WBC 3.8* 4.5  RBC 4.78 5.25*  HGB 12.9 14.3  HCT 40.0 44.3  MCV 83.7 84.4  MCH 27.0 27.2  MCHC 32.3 32.3  RDW 18.6* 19.5*  PLT 156 163    Cardiac EnzymesNo results for input(s): TROPONINI in the last 168 hours.  Recent Labs  Lab 11/20/17 1129  TROPIPOC 0.01     BNP Recent Labs  Lab 11/20/17 1216  BNP 1,151.7*     DDimer No results for input(s): DDIMER in the last 168 hours.   Radiology    Dg Chest 2 View  Result Date: 11/20/2017 CLINICAL DATA:  Shortness of breath, worse at night and when lying down. History of CHF. EXAM: CHEST - 2 VIEW COMPARISON:  Chest x-ray dated 11/13/2017, chest x-ray dated 07/05/2017 FINDINGS: Stable cardiomegaly. Lungs are clear. No evidence of active CHF at this time. No confluent opacity to suggest a developing pneumonia. No pleural effusion or pneumothorax seen. No acute or suspicious osseous finding. IMPRESSION: No active cardiopulmonary disease. No evidence of pneumonia or pulmonary edema. Stable cardiomegaly. Electronically Signed   By: Franki Cabot M.D.   On: 11/20/2017 11:33    Cardiac Studies   Echo 09/27/17: Study Conclusions - Left ventricle: The cavity size was normal. Wall thickness was normal. Systolic function was moderately to severely reduced. The estimated ejection fraction was in the range of 30% to 35%. Diffuse hypokinesis. The study is not technically sufficient to allow evaluation of LV diastolic  function. - Mitral valve: Mildly thickened leaflets . There was moderate regurgitation. - Left atrium: Moderately dilated. - Right atrium: The atrium was at the upper limits of normal in size. - Tricuspid valve: There was moderate regurgitation. - Pulmonary arteries: PA peak pressure: 42 mm Hg (S). - Inferior vena cava: The vessel was normal in size. The respirophasic diameter changes were in the normal range (>= 50%), consistent with normal central venous pressure.  Impressions: - Compared to a prior study in 2015, the LVEF has decreased to 30-35% with global hypokinesis - atrial fibrillation is present. There is moderate LAE.   Patient Profile     84 y.o. female  with a hx of atrial fibrillation on coumadin, chronic systolic heart failure with EF of 30-35%, CKD stage III, DM, HTN, HLD, chronic lower LE ulcers and claudication who was admitted worsening SOB and orthopnea. Recent hospitalization for acute HF, discharged 11/15/17. BNP 1151, SCr 1.87. Previously seen in HF clinic by Dr. Haroldine Laws, however, not seen since 2015.  Assessment & Plan    Acute on chronic systolic heart failure -Recent hospitalization for acute HF, discharged 11/15/17 on lasix 20 mg BID -Diuresing with lasix 40 mg IV BID.  -Wt at discharge on 5/28 was 147 lbs. Wt on this admission 149 lbs. Today down to 143 lbs.  -1.8L UOP since yesterday. -Continues on carvedilol. No ACE-I/ARB due to renal function.  -BP has been labile with some soft readings. BP limits HF med titration.  -Symptoms have improved, but still mildly short of breath and fatigued with min activity.  -Pt was unclear on her diuretic regimen at home. She will need to weigh daily at home and monitor of edema and dyspnea/orthopnea and report symptoms early. -Likely ready for discharge in 24-48 hrs. -continue IV lasix and plan to switch to oral dose in am.   CKD stage III -Scr was 2.00 at discharge on 5/28. On admission, 11/20/17, SCr  1.87. Baseline 1.6-1.8 -Today SCr unchanged. K+ 3.7  Atrial fibrillation -Rate controlled on carvedilol 9.375 mg bid. Rates 90's-low 100's. Med titration limited by BP.  -Anticoagulated for stroke risk reduction on coumadin. INR this am 1.86. Pharmacy dosing.   Right lower extremity chronic ulcers -Followed for wound care in [place] -ABI's have been ordered.  If ABIs are abnormal she will need angiography once renal function stabilizes. -continue statin. On lovastatin 40 mg daily at home, Pravastatin 40 mg daily here.   For questions or updates, please contact [place] Please consult [email or web address] for contact info under Cardiology/STEMI.      Signed, Daune Perch, NP  11/22/2017, 9:26 AM

## 2017-11-22 NOTE — Care Management Note (Signed)
Case Management Note  Patient Details  Name: Patricia Randall MRN: [id number] Date of Birth: 27-May-1929  Subjective/Objective:    CHF               Action/Plan: Patient recently discharged within a week, active with [facility]; PCP: Leonard Downing, MD; has private insurance with [facility] with prescription drug coverage; CM will continue to follow for progression of care.   Expected Discharge Date:      Possibly 11/25/2017            Expected Discharge Plan:  [place]  Discharge planning Services  CM Consult  HH Arranged:  RN [facility] Agency:  [place]  Status of Service:  In process, will continue to follow  Sherrilyn Rist [phone] 11/22/2017, 2:08 PM

## 2017-11-23 ENCOUNTER — Encounter (HOSPITAL_COMMUNITY): Payer: Medicare Other

## 2017-11-23 DIAGNOSIS — E1122 Type 2 diabetes mellitus with diabetic chronic kidney disease: Secondary | ICD-10-CM | POA: Diagnosis not present

## 2017-11-23 DIAGNOSIS — I5023 Acute on chronic systolic (congestive) heart failure: Secondary | ICD-10-CM | POA: Diagnosis not present

## 2017-11-23 DIAGNOSIS — I10 Essential (primary) hypertension: Secondary | ICD-10-CM | POA: Diagnosis not present

## 2017-11-23 DIAGNOSIS — I482 Chronic atrial fibrillation: Secondary | ICD-10-CM | POA: Diagnosis not present

## 2017-11-23 DIAGNOSIS — L97919 Non-pressure chronic ulcer of unspecified part of right lower leg with unspecified severity: Secondary | ICD-10-CM | POA: Diagnosis not present

## 2017-11-23 DIAGNOSIS — N183 Chronic kidney disease, stage 3 (moderate): Secondary | ICD-10-CM | POA: Diagnosis not present

## 2017-11-23 LAB — CBC
HEMATOCRIT: 46.6 % — AB (ref 36.0–46.0)
HEMOGLOBIN: 14.9 g/dL (ref 12.0–15.0)
MCH: 27.3 pg (ref 26.0–34.0)
MCHC: 32 g/dL (ref 30.0–36.0)
MCV: 85.5 fL (ref 78.0–100.0)
Platelets: 143 10*3/uL — ABNORMAL LOW (ref 150–400)
RBC: 5.45 MIL/uL — ABNORMAL HIGH (ref 3.87–5.11)
RDW: 19.9 % — AB (ref 11.5–15.5)
WBC: 4.9 10*3/uL (ref 4.0–10.5)

## 2017-11-23 LAB — BASIC METABOLIC PANEL
ANION GAP: 14 (ref 5–15)
BUN: 48 mg/dL — ABNORMAL HIGH (ref 6–20)
CALCIUM: 8.7 mg/dL — AB (ref 8.9–10.3)
CHLORIDE: 106 mmol/L (ref 101–111)
CO2: 21 mmol/L — AB (ref 22–32)
Creatinine, Ser: 1.83 mg/dL — ABNORMAL HIGH (ref 0.44–1.00)
GFR calc Af Amer: 28 mL/min — ABNORMAL LOW (ref >60)
GFR calc non Af Amer: 24 mL/min — ABNORMAL LOW (ref >60)
GLUCOSE: 131 mg/dL — AB (ref 65–99)
Potassium: 4 mmol/L (ref 3.5–5.1)
Sodium: 141 mmol/L (ref 135–145)

## 2017-11-23 LAB — IRON AND TIBC
Iron: 54 ug/dL (ref 28–170)
SATURATION RATIOS: 17 % (ref 10.4–31.8)
TIBC: 323 ug/dL (ref 250–450)
UIBC: 269 ug/dL

## 2017-11-23 LAB — PROTIME-INR
INR: 2.17
Prothrombin Time: 24 seconds — ABNORMAL HIGH (ref 11.4–15.2)

## 2017-11-23 LAB — GLUCOSE, CAPILLARY
GLUCOSE-CAPILLARY: 172 mg/dL — AB (ref 65–99)
GLUCOSE-CAPILLARY: 180 mg/dL — AB (ref 65–99)
Glucose-Capillary: 134 mg/dL — ABNORMAL HIGH (ref 65–99)
Glucose-Capillary: 89 mg/dL (ref 65–99)

## 2017-11-23 LAB — FERRITIN: FERRITIN: 25 ng/mL (ref 11–307)

## 2017-11-23 MED ORDER — METOPROLOL SUCCINATE ER 50 MG PO TB24
50.0000 mg | ORAL_TABLET | Freq: Two times a day (BID) | ORAL | Status: DC
Start: 2017-11-23 — End: 2017-11-25
  Administered 2017-11-23 – 2017-11-25 (×4): 50 mg via ORAL
  Filled 2017-11-23 (×5): qty 1

## 2017-11-23 MED ORDER — METOPROLOL TARTRATE 25 MG PO TABS
25.0000 mg | ORAL_TABLET | Freq: Once | ORAL | Status: AC
  Administered 2017-11-23: 25 mg via ORAL
  Filled 2017-11-23: qty 1

## 2017-11-23 MED ORDER — WARFARIN SODIUM 2 MG PO TABS
2.0000 mg | ORAL_TABLET | Freq: Once | ORAL | Status: AC
Start: 2017-11-23 — End: 2017-11-23
  Administered 2017-11-23: 2 mg via ORAL
  Filled 2017-11-23: qty 1

## 2017-11-23 MED ORDER — METOPROLOL TARTRATE 25 MG PO TABS: 25.0000 mg | ORAL_TABLET | Freq: Once | ORAL | Status: DC

## 2017-11-23 NOTE — Progress Notes (Signed)
Progress Note    Patricia Randall  MWU:[id number] DOB: [birth year]  DOA: 11/20/2017 PCP: Leonard Downing, MD    Brief Narrative:    Medical records reviewed and are as summarized below:  Patricia Randall is an 84 y.o. female with 2 recent admission for CHF.  She states that she is planning on starting to weigh herself and put her weight on the calendar but has not done so yet.  She has gained 2 pounds since discharge from the hospital.  She clearly needs a lot more assistance at home.  Presents with an acute exacerbation of congestive heart failure only days after being discharged from the hospital. Appears to not understand what she needs to do at home regarding diet and daily weight.  Regimen is being changed along with rate controlling meds.  Getting ABIs today and then home in AM if stable.    Assessment/Plan:   Principal Problem:   Acute on chronic systolic CHF (congestive heart failure) (HCC) Active Problems:   Permanent atrial fibrillation (HCC)   Cardiomyopathy (HCC)   Essential hypertension, benign   CKD (chronic kidney disease), stage III (HCC)   Protein-calorie malnutrition, severe   Type II diabetes mellitus ([place])   Ulcer of right lower extremity (HCC)   CHF exacerbation (HCC)  Acute on chronic  systolic   CHF  -history of progressive shortness of breath, with orthopnea, without chest pain, fever or chills, since her last admission to the hospital discharge on 11/15/2017.   - notices increasing edema, and a 2 pound weight gain since discharge.   -recent echo 30-35% with global hypokinesis  -per cards-  change from IV to PO BID 40 mg (up from 20 mg BID) lasix -monitor I/Os and daily weights -dietician-- needs further education -home health RN  Chronic Atrial fibrillation, on chronic Coumadin. CHADSVASC 5 Continue Coumadin- pharmacy to dose -was on coreg-- rate not controlled - cards changed to PO metoprolol and dose increased  Chronic kidney disease stage III -monitor  Cr closely while diuresing   Type II Diabetes  Hold home oral diabetic medications.   SSI  Right leg ulcer, the patient follows with wound care as an outpatient  -diminished pulses -ABI was planned as an outpatient, have ordered        Family Communication/Anticipated D/C date and plan/Code Status   DVT prophylaxis: warfarin Code Status: Full Code.  Family Communication: none at bedside Disposition Plan: home in AM?   Medical Consultants:   cards  Subjective:   Still getting short of breath, more with laying down and not exertion today  Objective:    Vitals:   11/23/17 0519 11/23/17 0829 11/23/17 1235 11/23/17 1249  BP: 124/90 (!) 112/96 105/87   Pulse:  (!) 113 (!) 36 (!) 105  Resp:   18   Temp:      TempSrc:      SpO2:   98%   Weight:      Height:        Intake/Output Summary (Last 24 hours) at 11/23/2017 1345 Last data filed at 11/23/2017 1008 Gross per 24 hour  Intake 360 ml  Output 650 ml  Net -290 ml   Filed Weights   11/21/17 0359 11/22/17 0414 11/23/17 0356  Weight: 66.7 kg (147 lb) 65.1 kg (143 lb 8 oz) 65.2 kg (143 lb 11.2 oz)    Exam: Eating breakfast, appears comfortable Pleasant and cooperative irr-- low 110s +BS, soft, NT   Data Reviewed:   I  have personally reviewed following labs and imaging studies:  Labs: Labs show the following:   Basic Metabolic Panel: Recent Labs  Lab 11/20/17 1117 11/21/17 0449 11/22/17 0552 11/23/17 0527  NA 139 141 144 141  K 3.9 4.0 3.7 4.0  CL 108 107 105 106  CO2 22 25 27  21*  GLUCOSE 171* 135* 126* 131*  BUN 47* 48* 45* 48*  CREATININE 1.87* 1.89* 1.86* 1.83*  CALCIUM 8.6* 9.0 9.1 8.7*   GFR Estimated Creatinine Clearance: 19.5 mL/min (A) (by C-G formula based on SCr of 1.83 mg/dL (H)). Liver Function Tests: No results for input(s): AST, ALT, ALKPHOS, BILITOT, PROT, ALBUMIN in the last 168 hours. No results for input(s): LIPASE, AMYLASE in the last 168 hours. No results for  input(s): AMMONIA in the last 168 hours. Coagulation profile Recent Labs  Lab 11/20/17 1236 11/21/17 0449 11/22/17 0552 11/23/17 0527  INR 1.82 1.66 1.82 2.17    CBC: Recent Labs  Lab 11/20/17 1117 11/21/17 0449 11/23/17 0527  WBC 3.8* 4.5 4.9  HGB 12.9 14.3 14.9  HCT 40.0 44.3 46.6*  MCV 83.7 84.4 85.5  PLT 156 163 143*   Cardiac Enzymes: No results for input(s): CKTOTAL, CKMB, CKMBINDEX, TROPONINI in the last 168 hours. BNP (last 3 results) No results for input(s): PROBNP in the last 8760 hours. CBG: Recent Labs  Lab 11/22/17 1127 11/22/17 1703 11/22/17 2058 11/23/17 0753 11/23/17 1235  GLUCAP 178* 147* 160* 134* 172*   D-Dimer: No results for input(s): DDIMER in the last 72 hours. Hgb A1c: No results for input(s): HGBA1C in the last 72 hours. Lipid Profile: No results for input(s): CHOL, HDL, LDLCALC, TRIG, CHOLHDL, LDLDIRECT in the last 72 hours. Thyroid function studies: No results for input(s): TSH, T4TOTAL, T3FREE, THYROIDAB in the last 72 hours.  Invalid input(s): FREET3 Anemia work up: Recent Labs    11/23/17 0527  FERRITIN 25  TIBC 323  IRON 54   Sepsis Labs: Recent Labs  Lab 11/20/17 1117 11/21/17 0449 11/23/17 0527  WBC 3.8* 4.5 4.9    Microbiology No results found for this or any previous visit (from the past 240 hour(s)).  Procedures and diagnostic studies:  No results found.  Medications:   . cholecalciferol  1,000 Units Oral Daily  . feeding supplement (GLUCERNA SHAKE)  237 mL Oral Q24H  . furosemide  40 mg Oral BID  . insulin aspart  0-9 Units Subcutaneous TID WC  . latanoprost  1 drop Both Eyes QHS  . metoprolol succinate  50 mg Oral BID  . pravastatin  40 mg Oral q1800  . silver sulfADIAZINE   Topical Daily  . warfarin  2 mg Oral ONCE-1800  . Warfarin - Pharmacist Dosing Inpatient   Does not apply q1800   Continuous Infusions:   LOS: 0 days   Geradine Girt  [facility]   *Please refer to [email or web address],  password [email or web address] to get updated schedule on who will round on this patient, as hospitalists switch teams weekly. If 7PM-7AM, please contact night-coverage at [email or web address], password [email or web address] for any overnight needs.  11/23/2017, 1:45 PM

## 2017-11-23 NOTE — Progress Notes (Signed)
Patient complained of intermittent SOB while walking. VS checked and stable. Patient denies pain. Per request place patient back on O2 [place].   Will continue to monitor.  Nithya Meriweather, RN

## 2017-11-23 NOTE — Progress Notes (Signed)
ANTICOAGULATION CONSULT NOTE   Pharmacy Consult for warfarin Indication: Atrial fibrilation  No Known Allergies  Patient Measurements: Height: 5\' 2"  (157.5 cm) Weight: 143 lb 11.2 oz (65.2 kg) IBW/kg (Calculated) : 50.1   Vital Signs: Temp: 97.4 F (36.3 C) (06/05 0356) Temp Source: Oral (06/05 0356) BP: 124/90 (06/05 0519) Pulse Rate: 59 (06/05 0356)  Labs: Recent Labs    11/20/17 1117  11/21/17 0449 11/22/17 0552 11/23/17 0527  HGB 12.9  --  14.3  --  14.9  HCT 40.0  --  44.3  --  46.6*  PLT 156  --  163  --  143*  LABPROT  --    < > 19.5* 20.9* 24.0*  INR  --    < > 1.66 1.82 2.17  CREATININE 1.87*  --  1.89* 1.86* 1.83*   < > = values in this interval not displayed.    Estimated Creatinine Clearance: 19.5 mL/min (A) (by C-G formula based on SCr of 1.83 mg/dL (H)).   Medical History: Past Medical History:  Diagnosis Date  . Atrial fibrillation ([place])    a. 03/2013 s/p TEE/DCCV;  b. 03/2013 Eliquis initiated.  . Chronic systolic CHF (congestive heart failure) ([place])    a. 03/2013 Echo: EF 20-25%, diff HK, mild to mod MR, sev dil LA, mild RV dysfxn, sev dil RA, mod TR.  . Claudication ([place])   . Glaucoma   . High cholesterol   . Hypertension   . Type II diabetes mellitus (HCC)    Assessment: 84 yo female on warfarin 4mg  x 2 days, then 2mg  x 2 days and repeat PTA for Afib. (Last dose PTA 6/1)  INR therapeutic this morning: 2.17 CBC stable, WNL  Goal of Therapy:  INR 2-3 Monitor platelets by anticoagulation protocol: Yes   Plan:  Warfarin 2 x1 tonight Monitor daily INR, CBC, s/s of bleed  Georga Bora, PharmD Clinical Pharmacist 11/23/2017 7:57 AM

## 2017-11-23 NOTE — Progress Notes (Addendum)
Progress Note  Patient Name: Patricia Randall Date of Encounter: 11/23/2017  Primary Cardiologist: Patricia Bickers, MD   Subjective   Pt reports breathing much better. She walked in the hall last night without shortness of breath or fatigue.   Inpatient Medications    Scheduled Meds: . cholecalciferol  1,000 Units Oral Daily  . feeding supplement (GLUCERNA SHAKE)  237 mL Oral Q24H  . furosemide  40 mg Oral BID  . insulin aspart  0-9 Units Subcutaneous TID WC  . latanoprost  1 drop Both Eyes QHS  . metoprolol succinate  25 mg Oral BID  . pravastatin  40 mg Oral q1800  . silver sulfADIAZINE   Topical Daily  . warfarin  2 mg Oral ONCE-1800  . Warfarin - Pharmacist Dosing Inpatient   Does not apply q1800   Continuous Infusions:  PRN Meds: acetaminophen **OR** acetaminophen, bisacodyl, HYDROcodone-acetaminophen, ondansetron **OR** ondansetron (ZOFRAN) IV, polyethylene glycol   Vital Signs    Vitals:   11/23/17 0048 11/23/17 0356 11/23/17 0519 11/23/17 0829  BP: 126/70 (!) 151/108 124/90 (!) 112/96  Pulse: 70 (!) 59  (!) 113  Resp: (!) 24 18    Temp: (!) 97.5 F (36.4 C) (!) 97.4 F (36.3 C)    TempSrc: Oral Oral    SpO2: 99% 100%    Weight:  143 lb 11.2 oz (65.2 kg)    Height:        Intake/Output Summary (Last 24 hours) at 11/23/2017 1057 Last data filed at 11/23/2017 0610 Gross per 24 hour  Intake 480 ml  Output 650 ml  Net -170 ml   Filed Weights   11/21/17 0359 11/22/17 0414 11/23/17 0356  Weight: 147 lb (66.7 kg) 143 lb 8 oz (65.1 kg) 143 lb 11.2 oz (65.2 kg)    Telemetry    Atrial fibrillation in the 80's-100's - Personally Reviewed  ECG    No new tracings - Personally Reviewed  Physical Exam   GEN: No acute distress.   Neck: No JVD Cardiac: irregularly irregular rhythm, no murmurs, rubs, or gallops.  Respiratory: Clear to auscultation bilaterally. GI: Soft, nontender, non-distended  MS: No edema, dressing on right lower leg.  Neuro:  Nonfocal    Psych: Normal affect   Labs    Chemistry Recent Labs  Lab 11/21/17 0449 11/22/17 0552 11/23/17 0527  NA 141 144 141  K 4.0 3.7 4.0  CL 107 105 106  CO2 25 27 21*  GLUCOSE 135* 126* 131*  BUN 48* 45* 48*  CREATININE 1.89* 1.86* 1.83*  CALCIUM 9.0 9.1 8.7*  GFRNONAA 23* 23* 24*  GFRAA 27* 27* 28*  ANIONGAP 9 12 14      Hematology Recent Labs  Lab 11/20/17 1117 11/21/17 0449 11/23/17 0527  WBC 3.8* 4.5 4.9  RBC 4.78 5.25* 5.45*  HGB 12.9 14.3 14.9  HCT 40.0 44.3 46.6*  MCV 83.7 84.4 85.5  MCH 27.0 27.2 27.3  MCHC 32.3 32.3 32.0  RDW 18.6* 19.5* 19.9*  PLT 156 163 143*    Cardiac EnzymesNo results for input(s): TROPONINI in the last 168 hours.  Recent Labs  Lab 11/20/17 1129  TROPIPOC 0.01     BNP Recent Labs  Lab 11/20/17 1216  BNP 1,151.7*     DDimer No results for input(s): DDIMER in the last 168 hours.   Radiology    No results found.  Cardiac Studies   Echo 09/27/17: Study Conclusions - Left ventricle: The cavity size was normal. Wall thickness was normal.  Systolic function was moderately to severely reduced. The estimated ejection fraction was in the range of 30% to 35%. Diffuse hypokinesis. The study is not technically sufficient to allow evaluation of LV diastolic function. - Mitral valve: Mildly thickened leaflets . There was moderate regurgitation. - Left atrium: Moderately dilated. - Right atrium: The atrium was at the upper limits of normal in size. - Tricuspid valve: There was moderate regurgitation. - Pulmonary arteries: PA peak pressure: 42 mm Hg (S). - Inferior vena cava: The vessel was normal in size. The respirophasic diameter changes were in the normal range (>= 50%), consistent with normal central venous pressure.  Impressions: - Compared to a prior study in 2015, the LVEF has decreased to 30-35% with global hypokinesis - atrial fibrillation is present. There is moderate LAE.  Patient Profile      84 y.o. female with a hx of atrial fibrillation on coumadin, chronic systolic heart failure with EF of 30-35%, CKD stage III, DM, HTN, HLD, chronic lower LE ulcers and claudicationwho was admitted worsening SOB and orthopnea. Recent hospitalization for acute HF, discharged 11/15/17. BNP 1151, SCr 1.87. Previously seen in HF clinic by Dr. Haroldine Randall, however, not seen since 2015.  Assessment & Plan    Acute on chronic systolic heart failure -Diureses with IV lasix with much improvement. Lasix switched to oral dosing today 40 mg bid, was on 20 mg bid at home (not clear if she was taking as directed).  -Wt at discharge on 5/28 was 147 lbs. Wt on this admission 149 lbs. Today down to 143 lbs.  -Net negative 2L fluid balance since admission.  -Carvedilol switched to metoprolol for better heart rate control in setting of soft BP. BP is higher today. She is not on ACE-I/ARB due to renal function. -Discussed pt taking her medications as prescribed including her diuretic. She also admits to eating a lot of peanut butter nabs. We discussed low sodium diet and better food alternatives.   CKD stage III -SCr 2.00 at discharge on 5/28. On admission, 11/20/17, SCr 1.87. Recent baseline appears to be ~1.6-1.8.  -SCr 1.87>1.89>1.86>1.83. Appears to be stable.   Permanent Atrial fibrillation -Rate controlled on BB. Carvedilol switched to metoprolol succinate 25 mg bid for better rate control with less blood pressure lowering. Plan to consolidate dosing to once daily once the dose is confirmed. BP is better today.  -Heart rates are still in the 80's-100's -She is anticoagulated for stroke risk reduction on coumadin. INR is therapeutic today at 2.17. Pharmacy dosing. Pt is requesting guidance of foods that she can eat while on coumadin. I will attach information to her AVS.   Right lower extremity chronic ulcers -Followed for wound care in [place] -ABI's have been ordered. If ABIs are abnormal she will need  angiography once renal function stabilizes. -continue statin. On lovastatin 40 mg daily at home, Pravastatin 40 mg daily here.    For questions or updates, please contact [place] Please consult [email or web address] for contact info under Cardiology/STEMI.      Signed, Patricia Perch, NP  11/23/2017, 10:57 AM

## 2017-11-24 ENCOUNTER — Observation Stay (HOSPITAL_BASED_OUTPATIENT_CLINIC_OR_DEPARTMENT_OTHER): Payer: Medicare Other

## 2017-11-24 DIAGNOSIS — I5033 Acute on chronic diastolic (congestive) heart failure: Secondary | ICD-10-CM

## 2017-11-24 DIAGNOSIS — I482 Chronic atrial fibrillation: Secondary | ICD-10-CM | POA: Diagnosis not present

## 2017-11-24 DIAGNOSIS — I5023 Acute on chronic systolic (congestive) heart failure: Secondary | ICD-10-CM | POA: Diagnosis not present

## 2017-11-24 DIAGNOSIS — I509 Heart failure, unspecified: Secondary | ICD-10-CM

## 2017-11-24 DIAGNOSIS — I10 Essential (primary) hypertension: Secondary | ICD-10-CM | POA: Diagnosis not present

## 2017-11-24 DIAGNOSIS — L0390 Cellulitis, unspecified: Secondary | ICD-10-CM | POA: Diagnosis not present

## 2017-11-24 DIAGNOSIS — N183 Chronic kidney disease, stage 3 (moderate): Secondary | ICD-10-CM | POA: Diagnosis not present

## 2017-11-24 LAB — CBC
HEMATOCRIT: 45.2 % (ref 36.0–46.0)
Hemoglobin: 14.5 g/dL (ref 12.0–15.0)
MCH: 27.1 pg (ref 26.0–34.0)
MCHC: 32.1 g/dL (ref 30.0–36.0)
MCV: 84.5 fL (ref 78.0–100.0)
Platelets: 144 10*3/uL — ABNORMAL LOW (ref 150–400)
RBC: 5.35 MIL/uL — ABNORMAL HIGH (ref 3.87–5.11)
RDW: 19.8 % — ABNORMAL HIGH (ref 11.5–15.5)
WBC: 4.1 10*3/uL (ref 4.0–10.5)

## 2017-11-24 LAB — BASIC METABOLIC PANEL
Anion gap: 15 (ref 5–15)
Anion gap: 11 (ref 5–15)
BUN: 63 mg/dL — ABNORMAL HIGH (ref 6–20)
BUN: 48 mg/dL — AB (ref 6–20)
CALCIUM: 9.2 mg/dL (ref 8.9–10.3)
CHLORIDE: 104 mmol/L (ref 101–111)
CO2: 23 mmol/L (ref 22–32)
CO2: 27 mmol/L (ref 22–32)
CREATININE: 1.98 mg/dL — AB (ref 0.44–1.00)
Calcium: 9.1 mg/dL (ref 8.9–10.3)
Chloride: 104 mmol/L (ref 101–111)
Creatinine, Ser: 4.18 mg/dL — ABNORMAL HIGH (ref 0.44–1.00)
GFR calc Af Amer: 25 mL/min — ABNORMAL LOW (ref >60)
GFR calc non Af Amer: 9 mL/min — ABNORMAL LOW (ref >60)
GFR calc non Af Amer: 22 mL/min — ABNORMAL LOW (ref >60)
GFR, EST AFRICAN AMERICAN: 10 mL/min — AB (ref >60)
Glucose, Bld: 146 mg/dL — ABNORMAL HIGH (ref 65–99)
Glucose, Bld: 143 mg/dL — ABNORMAL HIGH (ref 65–99)
Potassium: 4.7 mmol/L (ref 3.5–5.1)
Potassium: 4.2 mmol/L (ref 3.5–5.1)
SODIUM: 142 mmol/L (ref 135–145)
SODIUM: 142 mmol/L (ref 135–145)

## 2017-11-24 LAB — GLUCOSE, CAPILLARY
GLUCOSE-CAPILLARY: 153 mg/dL — AB (ref 65–99)
Glucose-Capillary: 135 mg/dL — ABNORMAL HIGH (ref 65–99)
Glucose-Capillary: 116 mg/dL — ABNORMAL HIGH (ref 65–99)
Glucose-Capillary: 221 mg/dL — ABNORMAL HIGH (ref 65–99)

## 2017-11-24 LAB — PROTIME-INR
INR: 2
Prothrombin Time: 22.6 seconds — ABNORMAL HIGH (ref 11.4–15.2)

## 2017-11-24 MED ORDER — CEPHALEXIN 500 MG PO CAPS
500.0000 mg | ORAL_CAPSULE | Freq: Two times a day (BID) | ORAL | Status: DC
Start: 2017-11-24 — End: 2017-11-25
  Administered 2017-11-24 – 2017-11-25 (×3): 500 mg via ORAL
  Filled 2017-11-24 (×3): qty 1

## 2017-11-24 MED ORDER — DIGOXIN 125 MCG PO TABS
0.0625 mg | ORAL_TABLET | Freq: Every day | ORAL | Status: DC
  Administered 2017-11-25: 0.0625 mg via ORAL
  Filled 2017-11-24: qty 1

## 2017-11-24 MED ORDER — DIGOXIN 125 MCG PO TABS
0.1250 mg | ORAL_TABLET | Freq: Two times a day (BID) | ORAL | Status: AC
  Administered 2017-11-24 (×2): 0.125 mg via ORAL
  Filled 2017-11-24 (×2): qty 1

## 2017-11-24 MED ORDER — WARFARIN SODIUM 2 MG PO TABS
4.0000 mg | ORAL_TABLET | Freq: Once | ORAL | Status: AC
  Administered 2017-11-24: 4 mg via ORAL
  Filled 2017-11-24: qty 2

## 2017-11-24 MED ORDER — FUROSEMIDE 40 MG PO TABS
40.0000 mg | ORAL_TABLET | Freq: Two times a day (BID) | ORAL | Status: DC
  Administered 2017-11-24 – 2017-11-25 (×2): 40 mg via ORAL
  Filled 2017-11-24 (×2): qty 1

## 2017-11-24 NOTE — Progress Notes (Signed)
ANTICOAGULATION CONSULT NOTE   Pharmacy Consult for warfarin Indication: Atrial fibrilation  No Known Allergies  Patient Measurements: Height: 5\' 2"  (157.5 cm) Weight: 141 lb (64 kg) IBW/kg (Calculated) : 50.1   Vital Signs: Temp: 97.5 F (36.4 C) (06/06 0344) Temp Source: Oral (06/06 0344) BP: 137/99 (06/06 0852) Pulse Rate: 111 (06/06 0857)  Labs: Recent Labs    11/22/17 0552 11/23/17 0527 11/24/17 0453 11/24/17 0806  HGB  --  14.9 14.5  --   HCT  --  46.6* 45.2  --   PLT  --  143* 144*  --   LABPROT 20.9* 24.0* 22.6*  --   INR 1.82 2.17 2.00  --   CREATININE 1.86* 1.83* 4.18* 1.98*    Estimated Creatinine Clearance: 17.9 mL/min (A) (by C-G formula based on SCr of 1.98 mg/dL (H)).   Medical History: Past Medical History:  Diagnosis Date  . Atrial fibrillation ([place])    a. 03/2013 s/p TEE/DCCV;  b. 03/2013 Eliquis initiated.  . Chronic systolic CHF (congestive heart failure) ([place])    a. 03/2013 Echo: EF 20-25%, diff HK, mild to mod MR, sev dil LA, mild RV dysfxn, sev dil RA, mod TR.  . Claudication ([place])   . Glaucoma   . High cholesterol   . Hypertension   . Type II diabetes mellitus (HCC)    Assessment: 84 yo female on warfarin 4mg  x 2 days, then 2mg  x 2 days and repeat PTA for Afib. (Last dose PTA 6/1)  INR therapeutic but at the low end of therapeutic range this morning: 2.00, CBC stable WNL, no overt bleeding documented  Goal of Therapy:  INR 2-3 Monitor platelets by anticoagulation protocol: Yes   Plan:  Warfarin 4mg  x1 tonight Monitor daily INR, CBC, s/s of bleed  Georga Bora, PharmD Clinical Pharmacist 11/24/2017 9:59 AM

## 2017-11-24 NOTE — Progress Notes (Signed)
ABI's have been completed. Right Non-compressible Left Non-compressible Unable to obtain TBI's due to low amplitude waveforms.  11/24/17 3:25 PM Patricia Randall RVT

## 2017-11-24 NOTE — Progress Notes (Signed)
[facility]                                                                              Patient Demographics  Patricia Randall, is a 84 y.o. female, DOB - 02/01/1929, NAT:[id number]  Admit date - 11/20/2017   Admitting Physician Lady Deutscher, MD  Outpatient Primary MD for the patient is Leonard Downing, MD  Outpatient specialists:   LOS - 0  days   Medical records reviewed and are as summarized below:    Chief Complaint  Patient presents with  . Shortness of Breath       Brief summary   Patient is a 84 year old female with history of A. fib, on Coumadin, CHF with EF 30 to 35%, diabetes, hypertension, recent admission and discharged on 5/28 presented with increasing shortness of breath, orthopnea, PND, worsening peripheral edema.  Patient was admitted for CHF exacerbation.   Assessment & Plan    Principal Problem:   Acute on chronic systolic CHF (congestive heart failure) ([place]) -Recently discharged on 5/28, presented with progressive shortness of breath, orthopnea, 2 pound weight gain since discharge and increasing edema.  No chest pain fevers or chills.  Reported being compliant with her Lasix which she had increased. -Continue strict I's and O's and daily weights, -Continue Lasix 40 mg BID, beta-blocker, no ACE/ARB given renal insufficiency -Negative balance of 1.9 L, weight down from 149lbs at admission to 141 lbs today   Active Problems:   Permanent atrial fibrillation [facility]) -Cardiology starting digoxin today, rate not well controlled -Continue beta-blocker, titrate up, continue Coumadin per pharmacy    Essential hypertension, benign -BP currently stable    CKD (chronic kidney disease), stage III (HCC) - Mild increase in creatinine function, repeated BMET this morning showed creatinine of 1.9 -Hold Lasix today am dose    Protein-calorie malnutrition, severe Nutrition consulted    Type II diabetes mellitus ([place]) -CBGs controlled, continue  sliding scale insulin while inpatient -Hemoglobin A1c 7.6 on 09/27/2017    Ulcer of right lower extremity (HCC) -Nonhealing, ABI pending -Continue wound care, placed on Keflex  Code Status: full  DVT Prophylaxis:  Warfarin  Family Communication: Discussed in detail with the patient, all imaging results, lab results explained to the patient    Disposition Plan:   Time Spent in minutes  35 minutes  Procedures:    Consultants:   Cardiology  Antimicrobials:   Keflex 6/6   Medications  Scheduled Meds: . cephALEXin  500 mg Oral BID  . cholecalciferol  1,000 Units Oral Daily  . [START ON 11/25/2017] digoxin  0.0625 mg Oral Daily  . digoxin  0.125 mg Oral BID  . feeding supplement (GLUCERNA SHAKE)  237 mL Oral Q24H  . insulin aspart  0-9 Units Subcutaneous TID WC  . latanoprost  1 drop Both Eyes QHS  . metoprolol succinate  50 mg Oral BID  . pravastatin  40 mg Oral q1800  . silver sulfADIAZINE   Topical Daily  . warfarin  4 mg Oral ONCE-1800  . Warfarin - Pharmacist Dosing Inpatient   Does not apply q1800   Continuous Infusions: PRN Meds:.acetaminophen **OR** acetaminophen,  bisacodyl, HYDROcodone-acetaminophen, ondansetron **OR** ondansetron (ZOFRAN) IV, polyethylene glycol   Antibiotics   Anti-infectives (From admission, onward)   Start     Dose/Rate Route Frequency Ordered Stop   11/24/17 1200  cephALEXin (KEFLEX) capsule 500 mg     500 mg Oral 2 times daily 11/24/17 1029          Subjective:   Eladia Frame was seen and examined today.  Sitting up in the chair, shortness of breath is improving no chest pain.  Patient denies dizziness, abdominal pain, N/V/D/C, new weakness, numbess, tingling. No acute events overnight.    Objective:   Vitals:   11/24/17 0349 11/24/17 0852 11/24/17 0857 11/24/17 1137  BP:  (!) 137/99  96/84  Pulse:  (!) 46 (!) 111 90  Resp:      Temp:    99.2 F (37.3 C)  TempSrc:    Oral  SpO2:  97%  99%  Weight: 64 kg (141 lb)     Height:         Intake/Output Summary (Last 24 hours) at 11/24/2017 1201 Last data filed at 11/24/2017 0940 Gross per 24 hour  Intake 480 ml  Output 400 ml  Net 80 ml     Wt Readings from Last 3 Encounters:  11/24/17 64 kg (141 lb)  11/15/17 66.7 kg (147 lb)  10/25/17 68.5 kg (151 lb)     Exam  General: Alert and oriented x 3, NAD  Eyes:   HEENT:  Atraumatic, normocephalic, normal oropharynx  Cardiovascular: S1 S2 auscultated,  Regular rate and rhythm.  Respiratory: Clear to auscultation bilaterally, no wheezing, rales or rhonchi  Gastrointestinal: Soft, nontender, nondistended, + bowel sounds  Ext: no pedal edema bilaterally  Neuro: no new deficit  Musculoskeletal: No digital cyanosis, clubbing  Skin: Right lower extremity, 2 small wounds granulation tissue, no discharge  Psych: Normal affect and demeanor, alert and oriented x3    Data Reviewed:  I have personally reviewed following labs and imaging studies  Micro Results No results found for this or any previous visit (from the past 240 hour(s)).  Radiology Reports Dg Chest 2 View  Result Date: 11/20/2017 CLINICAL DATA:  Shortness of breath, worse at night and when lying down. History of CHF. EXAM: CHEST - 2 VIEW COMPARISON:  Chest x-ray dated 11/13/2017, chest x-ray dated 07/05/2017 FINDINGS: Stable cardiomegaly. Lungs are clear. No evidence of active CHF at this time. No confluent opacity to suggest a developing pneumonia. No pleural effusion or pneumothorax seen. No acute or suspicious osseous finding. IMPRESSION: No active cardiopulmonary disease. No evidence of pneumonia or pulmonary edema. Stable cardiomegaly. Electronically Signed   By: Franki Cabot M.D.   On: 11/20/2017 11:33   Dg Chest 2 View  Result Date: 11/13/2017 CLINICAL DATA:  Shortness of breath for 2-3 weeks. EXAM: CHEST - 2 VIEW COMPARISON:  09/26/2017 FINDINGS: Enlarged cardiac silhouette, not significantly changed. Mediastinal contours appear intact.  Calcific atherosclerotic disease of the aorta. There is no evidence of focal airspace consolidation, pleural effusion or pneumothorax. Interstitial pulmonary edema. Osseous structures are without acute abnormality. Soft tissues are grossly normal. IMPRESSION: Enlarged cardiac silhouette which may be due to congestive heart failure or pericardial effusion. Interstitial pulmonary edema. Calcific atherosclerotic disease and tortuosity of the aorta. Electronically Signed   By: Fidela Salisbury M.D.   On: 11/13/2017 16:34    Lab Data:  CBC: Recent Labs  Lab 11/20/17 1117 11/21/17 0449 11/23/17 0527 11/24/17 0453  WBC 3.8* 4.5 4.9 4.1  HGB 12.9 14.3 14.9 14.5  HCT 40.0 44.3 46.6* 45.2  MCV 83.7 84.4 85.5 84.5  PLT 156 163 143* 884*   Basic Metabolic Panel: Recent Labs  Lab 11/21/17 0449 11/22/17 0552 11/23/17 0527 11/24/17 0453 11/24/17 0806  NA 141 144 141 142 142  K 4.0 3.7 4.0 4.7 4.2  CL 107 105 106 104 104  CO2 25 27 21* 23 27  GLUCOSE 135* 126* 131* 146* 143*  BUN 48* 45* 48* 63* 48*  CREATININE 1.89* 1.86* 1.83* 4.18* 1.98*  CALCIUM 9.0 9.1 8.7* 9.2 9.1   GFR: Estimated Creatinine Clearance: 17.9 mL/min (A) (by C-G formula based on SCr of 1.98 mg/dL (H)). Liver Function Tests: No results for input(s): AST, ALT, ALKPHOS, BILITOT, PROT, ALBUMIN in the last 168 hours. No results for input(s): LIPASE, AMYLASE in the last 168 hours. No results for input(s): AMMONIA in the last 168 hours. Coagulation Profile: Recent Labs  Lab 11/20/17 1236 11/21/17 0449 11/22/17 0552 11/23/17 0527 11/24/17 0453  INR 1.82 1.66 1.82 2.17 2.00   Cardiac Enzymes: No results for input(s): CKTOTAL, CKMB, CKMBINDEX, TROPONINI in the last 168 hours. BNP (last 3 results) No results for input(s): PROBNP in the last 8760 hours. HbA1C: No results for input(s): HGBA1C in the last 72 hours. CBG: Recent Labs  Lab 11/23/17 1235 11/23/17 1624 11/23/17 2059 11/24/17 0723 11/24/17 1135    GLUCAP 172* 89 180* 135* 153*   Lipid Profile: No results for input(s): CHOL, HDL, LDLCALC, TRIG, CHOLHDL, LDLDIRECT in the last 72 hours. Thyroid Function Tests: No results for input(s): TSH, T4TOTAL, FREET4, T3FREE, THYROIDAB in the last 72 hours. Anemia Panel: Recent Labs    11/23/17 0527  FERRITIN 25  TIBC 323  IRON 54   Urine analysis:    Component Value Date/Time   COLORURINE YELLOW 09/02/2017 1354   APPEARANCEUR CLEAR 09/02/2017 1354   LABSPEC 1.010 09/02/2017 1354   PHURINE 5.0 09/02/2017 1354   GLUCOSEU NEGATIVE 09/02/2017 1354   HGBUR NEGATIVE 09/02/2017 1354   BILIRUBINUR NEGATIVE 09/02/2017 1354   KETONESUR NEGATIVE 09/02/2017 1354   PROTEINUR NEGATIVE 09/02/2017 1354   NITRITE NEGATIVE 09/02/2017 1354   LEUKOCYTESUR NEGATIVE 09/02/2017 1354     America Sandall M.D. [facility] 11/24/2017, 12:01 PM  Pager: [phone] Between 7am to 7pm - call Pager - [phone]  After 7pm go to [email or web address] - password [email or web address]  Call night coverage person covering after 7pm

## 2017-11-24 NOTE — Progress Notes (Addendum)
Progress Note  Patient Name: Patricia Randall Date of Encounter: 11/24/2017  Primary Cardiologist: Glori Bickers, MD   Subjective   Breathing has improved.   Inpatient Medications    Scheduled Meds: . cephALEXin  500 mg Oral Q8H  . cholecalciferol  1,000 Units Oral Daily  . feeding supplement (GLUCERNA SHAKE)  237 mL Oral Q24H  . insulin aspart  0-9 Units Subcutaneous TID WC  . latanoprost  1 drop Both Eyes QHS  . metoprolol succinate  50 mg Oral BID  . pravastatin  40 mg Oral q1800  . silver sulfADIAZINE   Topical Daily  . warfarin  4 mg Oral ONCE-1800  . Warfarin - Pharmacist Dosing Inpatient   Does not apply q1800   Continuous Infusions:  PRN Meds: acetaminophen **OR** acetaminophen, bisacodyl, HYDROcodone-acetaminophen, ondansetron **OR** ondansetron (ZOFRAN) IV, polyethylene glycol   Vital Signs    Vitals:   11/24/17 0344 11/24/17 0349 11/24/17 0852 11/24/17 0857  BP: 113/88  (!) 137/99   Pulse: 83  (!) 46 (!) 111  Resp: 14     Temp: (!) 97.5 F (36.4 C)     TempSrc: Oral     SpO2: 100%  97%   Weight:  141 lb (64 kg)    Height:        Intake/Output Summary (Last 24 hours) at 11/24/2017 1034 Last data filed at 11/24/2017 0940 Gross per 24 hour  Intake 480 ml  Output 400 ml  Net 80 ml   Filed Weights   11/22/17 0414 11/23/17 0356 11/24/17 0349  Weight: 143 lb 8 oz (65.1 kg) 143 lb 11.2 oz (65.2 kg) 141 lb (64 kg)    Telemetry    A-fib, rates 80s-120s - Personally Reviewed  ECG    Atrial Fibrillation, ventricular rate of 90 - Personally Reviewed (6/3)  Physical Exam   GEN: No acute distress.   Neck: No JVD Cardiac: RRR, no murmurs, rubs, or gallops.  Respiratory: Clear to auscultation bilaterally. GI: Soft, nontender, non-distended  MS: No edema; No deformity. Neuro:  Nonfocal  Psych: Normal affect   Labs    Chemistry Recent Labs  Lab 11/23/17 0527 11/24/17 0453 11/24/17 0806  NA 141 142 142  K 4.0 4.7 4.2  CL 106 104 104  CO2 21* 23  27  GLUCOSE 131* 146* 143*  BUN 48* 63* 48*  CREATININE 1.83* 4.18* 1.98*  CALCIUM 8.7* 9.2 9.1  GFRNONAA 24* 9* 22*  GFRAA 28* 10* 25*  ANIONGAP 14 15 11      Hematology Recent Labs  Lab 11/21/17 0449 11/23/17 0527 11/24/17 0453  WBC 4.5 4.9 4.1  RBC 5.25* 5.45* 5.35*  HGB 14.3 14.9 14.5  HCT 44.3 46.6* 45.2  MCV 84.4 85.5 84.5  MCH 27.2 27.3 27.1  MCHC 32.3 32.0 32.1  RDW 19.5* 19.9* 19.8*  PLT 163 143* 144*    Cardiac EnzymesNo results for input(s): TROPONINI in the last 168 hours.  Recent Labs  Lab 11/20/17 1129  TROPIPOC 0.01     BNP Recent Labs  Lab 11/20/17 1216  BNP 1,151.7*     DDimer No results for input(s): DDIMER in the last 168 hours.   Radiology    No results found.  Cardiac Studies   None  Patient Profile     84 y.o. female with a pmhx of chronic systolic and diastolic heart failure, chronic atrial fibrillation, CKD III, HTN, HLD, and DM here with heart failure exacerbation.   Assessment & Plan    Acute  on Chronic Systolic Heart Failure: Switched to oral lasix 40 mg BID yesterday (increased from home 20 mg BID). I/Os appear inaccurate, weight is down an additional 2 lbs (8lbs total since admission). Renal function is stable at baseline.  -- Continue lasix 40 mg BID -- Follow renal function  -- Beta blocker changed yesterday carvedilol -> metoprolol given soft BP, improved today. -- No ACEi/ARB given renal funciton  CKD Stage III: -- At baseline  Permanent Atrial Fibrillation: -- Rates uncontrolled 80s-120s  -- Start digoxin  -- Tele -- Continue metoprolol succinate 25 mg BID -- Continue coumadin, INR at 2.0 at goal   Right Lower Extremities Chronic Ulcers: -- Follows with wound care in [place] -- ABIs scheduled today  -- Continue statin   For questions or updates, please contact [facility] Please consult [email or web address] for contact info under Cardiology/STEMI.      Signed, Velna Ochs, MD  11/24/2017, 10:34 AM

## 2017-11-24 NOTE — Progress Notes (Signed)
Physical Therapy Re Evaluation and Treatment Patient Details Name: Patricia Randall MRN: [id number] DOB: March 04, 1929 Today's Date: 11/24/2017    History of Present Illness 84 y.o. female with medical history significant for atrial fibrillation currently on Coumadin, CHF with EF 30 to 35%, diabetes, hypertension, recent admission for CHF exacerbation discharged on 11/15/2017. Presenting to ED with increased SOB.    PT Comments    Pt appears to be close to her baseline level of mobility. Pt does state that it is easier for her to ambulate with the RW. Pt is currently mod I for bed mobility and transfers and supervision for ambulation of 300 feet with RW and ascend/descend of 4 steps with railing assist. Pt with no SoB with ambulation today. PT continues to recommend RW for d/c and requires no additional PT follow up. PT signing off.    Follow Up Recommendations  No PT follow up     Equipment Recommendations  Rolling walker with 5" wheels    Recommendations for Other Services       Precautions / Restrictions Precautions Precautions: None Restrictions Weight Bearing Restrictions: No    Mobility  Bed Mobility Overal bed mobility: Modified Independent             General bed mobility comments: Pt performed supine>sit with HOB elevated mod I. Pt able to scoot to EOB and reach to pull socks up  Transfers Overall transfer level: Modified independent Equipment used: None             General transfer comment: good power up and steadying at [name] without assist   Ambulation/Gait Ambulation/Gait assistance: Supervision Ambulation Distance (Feet): 300 Feet Assistive device: Rolling walker (2 wheeled) Gait Pattern/deviations: Decreased stride length;Step-through pattern Gait velocity: decreased Gait velocity interpretation: 1.31 - 2.62 ft/sec, indicative of limited community ambulator General Gait Details: Pt ambulated 300 feet with RW, no SOB with ambulation    Stairs   Stairs  assistance: Supervision Stair Management: One rail Right;Step to pattern Number of Stairs: 4 General stair comments: Pt ascended and descended 4 stairs with step to pattern and use of R hand rail (ascending) with supervision for safety       Balance Overall balance assessment: Needs assistance Sitting-balance support: No upper extremity supported;Feet unsupported Sitting balance-Leahy Scale: Good       Standing balance-Leahy Scale: Good                              Cognition Arousal/Alertness: Awake/alert Behavior During Therapy: WFL for tasks assessed/performed Overall Cognitive Status: Within Functional Limits for tasks assessed                                           General Comments General comments (skin integrity, edema, etc.): VSS throughout session      Pertinent Vitals/Pain Pain Assessment: No/denies pain Pain Descriptors / Indicators: Discomfort    Home Living Family/patient expects to be discharged to:: Private residence Living Arrangements: Children Available Help at Discharge: Family;Available PRN/intermittently Type of Home: House Home Access: Stairs to enter Entrance Stairs-Rails: Right Home Layout: One level Home Equipment: Cane - single point Additional Comments: Pt owns cane but does not use    Prior Function Level of Independence: Independent      Comments: No falls in past year reported; pt enjoys going to church;  PT Goals (current goals can now be found in the care plan section) Acute Rehab PT Goals Patient Stated Goal: To go home without O2 PT Goal Formulation: With patient Time For Goal Achievement: 12/05/17 Potential to Achieve Goals: Good       PT Plan Pt is at baseline level of function and requires no additional PT services.        AM-PAC PT "6 Clicks" Daily Activity  Outcome Measure  Difficulty turning over in bed (including adjusting bedclothes, sheets and blankets)?: None Difficulty moving  from lying on back to sitting on the side of the bed? : None Difficulty sitting down on and standing up from a chair with arms (e.g., wheelchair, bedside commode, etc,.)?: None Help needed moving to and from a bed to chair (including a wheelchair)?: None Help needed walking in hospital room?: None Help needed climbing 3-5 steps with a railing? : A Little 6 Click Score: 23    End of Session Equipment Utilized During Treatment: Gait belt Activity Tolerance: Patient tolerated treatment well Patient left: in chair;with call bell/phone within reach Nurse Communication: Mobility status PT Visit Diagnosis: Other abnormalities of gait and mobility (R26.89)     Time: 9872-1587 PT Time Calculation (min) (ACUTE ONLY): 18 min  Charges:  $Gait Training: 8-22 mins                    G Codes:       Autym Siess B. Migdalia Dk PT, DPT Acute Rehabilitation  [phone] Pager [phone]     [place] 11/24/2017, 4:09 PM

## 2017-11-25 ENCOUNTER — Other Ambulatory Visit: Payer: Self-pay | Admitting: Physician Assistant

## 2017-11-25 DIAGNOSIS — E1122 Type 2 diabetes mellitus with diabetic chronic kidney disease: Secondary | ICD-10-CM

## 2017-11-25 DIAGNOSIS — I998 Other disorder of circulatory system: Secondary | ICD-10-CM | POA: Diagnosis not present

## 2017-11-25 DIAGNOSIS — I482 Chronic atrial fibrillation: Secondary | ICD-10-CM | POA: Diagnosis not present

## 2017-11-25 DIAGNOSIS — L97919 Non-pressure chronic ulcer of unspecified part of right lower leg with unspecified severity: Secondary | ICD-10-CM | POA: Diagnosis not present

## 2017-11-25 DIAGNOSIS — I10 Essential (primary) hypertension: Secondary | ICD-10-CM | POA: Diagnosis not present

## 2017-11-25 DIAGNOSIS — I4819 Other persistent atrial fibrillation: Secondary | ICD-10-CM

## 2017-11-25 DIAGNOSIS — I509 Heart failure, unspecified: Secondary | ICD-10-CM | POA: Diagnosis not present

## 2017-11-25 DIAGNOSIS — N183 Chronic kidney disease, stage 3 (moderate): Secondary | ICD-10-CM | POA: Diagnosis not present

## 2017-11-25 DIAGNOSIS — I5023 Acute on chronic systolic (congestive) heart failure: Secondary | ICD-10-CM | POA: Diagnosis not present

## 2017-11-25 LAB — BASIC METABOLIC PANEL
Anion gap: 12 (ref 5–15)
BUN: 51 mg/dL — AB (ref 6–20)
CHLORIDE: 107 mmol/L (ref 101–111)
CO2: 22 mmol/L (ref 22–32)
Calcium: 9.2 mg/dL (ref 8.9–10.3)
Creatinine, Ser: 1.79 mg/dL — ABNORMAL HIGH (ref 0.44–1.00)
GFR calc Af Amer: 28 mL/min — ABNORMAL LOW (ref >60)
GFR calc non Af Amer: 25 mL/min — ABNORMAL LOW (ref >60)
Glucose, Bld: 137 mg/dL — ABNORMAL HIGH (ref 65–99)
POTASSIUM: 4.5 mmol/L (ref 3.5–5.1)
SODIUM: 141 mmol/L (ref 135–145)

## 2017-11-25 LAB — CBC
HCT: 46.8 % — ABNORMAL HIGH (ref 36.0–46.0)
Hemoglobin: 15 g/dL (ref 12.0–15.0)
MCH: 27.4 pg (ref 26.0–34.0)
MCHC: 32.1 g/dL (ref 30.0–36.0)
MCV: 85.4 fL (ref 78.0–100.0)
Platelets: 179 10*3/uL (ref 150–400)
RBC: 5.48 MIL/uL — AB (ref 3.87–5.11)
RDW: 19.6 % — ABNORMAL HIGH (ref 11.5–15.5)
WBC: 4.4 10*3/uL (ref 4.0–10.5)

## 2017-11-25 LAB — LIPID PANEL
CHOLESTEROL: 124 mg/dL (ref 0–200)
HDL: 42 mg/dL (ref >40)
LDL Cholesterol: 61 mg/dL (ref 0–99)
TRIGLYCERIDES: 105 mg/dL (ref <150)
Total CHOL/HDL Ratio: 3 RATIO
VLDL: 21 mg/dL (ref 0–40)

## 2017-11-25 LAB — PROTIME-INR
INR: 1.79
PROTHROMBIN TIME: 20.6 s — AB (ref 11.4–15.2)

## 2017-11-25 LAB — GLUCOSE, CAPILLARY
GLUCOSE-CAPILLARY: 137 mg/dL — AB (ref 65–99)
Glucose-Capillary: 151 mg/dL — ABNORMAL HIGH (ref 65–99)

## 2017-11-25 MED ORDER — CEPHALEXIN 500 MG PO CAPS: 500.0000 mg | ORAL_CAPSULE | Freq: Two times a day (BID) | ORAL | 0 refills | Status: AC

## 2017-11-25 MED ORDER — WARFARIN SODIUM 5 MG PO TABS: 5.0000 mg | ORAL_TABLET | Freq: Once | ORAL | Status: DC

## 2017-11-25 MED ORDER — DIGOXIN 62.5 MCG PO TABS: 0.0625 mg | ORAL_TABLET | Freq: Every day | ORAL | 3 refills | Status: DC

## 2017-11-25 MED ORDER — METOPROLOL SUCCINATE ER 100 MG PO TB24: 100.0000 mg | ORAL_TABLET | Freq: Every day | ORAL | 0 refills | Status: DC

## 2017-11-25 MED ORDER — FUROSEMIDE 40 MG PO TABS: 40.0000 mg | ORAL_TABLET | Freq: Two times a day (BID) | ORAL | 5 refills | Status: DC

## 2017-11-25 NOTE — Discharge Summary (Signed)
Physician Discharge Summary   Patient ID: Patricia Randall MRN: [id number] DOB/AGE: 1928-12-18 84 y.o.  Admit date: 11/20/2017 Discharge date: 11/25/2017  Primary Care Physician:  Leonard Downing, MD   Recommendations for Outpatient Follow-up:  1. Follow up with PCP in 1-2 weeks 2. Please check BMET at the time of follow-up appointment  Home Health: None  Equipment/Devices: None  Discharge Condition: stable CODE STATUS: FULL  Diet recommendation: Carb modified   Discharge Diagnoses:    . Permanent atrial fibrillation ([place]) . Essential hypertension, benign . CKD (chronic kidney disease), stage III ([place]) . Protein-calorie malnutrition, severe . Acute on chronic systolic CHF (congestive heart failure) ([place]) . Ulcer of right lower extremity (HCC) Severe protein calorie malnutrition  type 2 diabetes mellitus    Consults: Cardiology    Allergies:  No Known Allergies   DISCHARGE MEDICATIONS: Allergies as of 11/25/2017   No Known Allergies     Medication List    STOP taking these medications   carvedilol 3.125 MG tablet Commonly known as:  COREG     TAKE these medications   cephALEXin 500 MG capsule Commonly known as:  KEFLEX Take 1 capsule (500 mg total) by mouth 2 (two) times daily for 10 days.   cholecalciferol 1000 units tablet Commonly known as:  VITAMIN D Take 1,000 Units by mouth daily.   Digoxin 62.5 MCG Tabs Take 0.0625 mg by mouth daily. Start taking on:  11/26/2017   feeding supplement (GLUCERNA SHAKE) Liqd Take 237 mLs by mouth daily.   furosemide 40 MG tablet Commonly known as:  LASIX Take 1 tablet (40 mg total) by mouth 2 (two) times daily. What changed:    medication strength  how much to take  when to take this   glimepiride 4 MG tablet Commonly known as:  AMARYL Take 1 tablet (4 mg total) by mouth daily before breakfast.   HAIR/SKIN/NAILS/BIOTIN PO Take 1 tablet by mouth daily.   latanoprost 0.005 % ophthalmic  solution Commonly known as:  XALATAN Place 1 drop into both eyes at bedtime.   lovastatin 40 MG tablet Commonly known as:  MEVACOR Take 1 tablet (40 mg total) by mouth at bedtime.   metoprolol succinate 100 MG 24 hr tablet Commonly known as:  TOPROL-XL Take 1 tablet (100 mg total) by mouth daily. Take with or immediately following a meal.   polyethylene glycol packet Commonly known as:  MIRALAX / GLYCOLAX Take 17 g by mouth daily as needed for mild constipation.   silver sulfADIAZINE 1 % cream Commonly known as:  SILVADENE Apply to affected area daily   warfarin 2 MG tablet Commonly known as:  COUMADIN Take 1 tablet (2 mg total) by mouth daily. What changed:    how much to take  when to take this  additional instructions        Brief H and P: For complete details please refer to admission H and P, but in brief Patient is a 84 year old female with history of A. fib, on Coumadin, CHF with EF 30 to 35%, diabetes, hypertension, recent admission and discharged on 5/28 presented with increasing shortness of breath, orthopnea, PND, worsening peripheral edema.  Patient was admitted for CHF exacerbation.   Hospital Course:  Acute on chronic systolic CHF (congestive heart failure) ([place]) -Recently discharged on 5/28, presented with progressive shortness of breath, orthopnea, 2 pound weight gain since discharge and increasing edema.  No chest pain fevers or chills.  Reported being compliant with her Lasix which she had  increased. -Continue Lasix 40 mg BID, beta-blocker, no ACE/ARB given renal insufficiency -Negative balance of 2.5 L, weight down from 149lbs at admission to 142lbs at discharge      Permanent atrial fibrillation [facility]) -Patient was placed on digoxin, initially rate was not well controlled.  She received loading dose of digoxin -Placed on Toprol XL consolidated to 100 mg daily    Essential hypertension, benign -BP currently stable, continue Toprol-XL    CKD  (chronic kidney disease), stage III (HCC) - Mild increase in creatinine function, repeated BMET this morning showed creatinine of 1.9 -Hold Lasix today am dose    Protein-calorie malnutrition, severe Nutrition was consulted patient was placed on    Type II diabetes mellitus ([place]) -CBGs controlled, patient was placed on sliding scale insulin while inpatient.  Continue Amaryl outpatient  -Hemoglobin A1c 7.6 on 09/27/2017    Ulcer of right lower extremity (HCC) -Nonhealing for last 1 month, ABIs were completed however right and left noncompressible due to low amplitude waveforms -Continue wound care, placed on Keflex for 10 days, continue Santyl ointment, follow outpatient with PCP   Day of Discharge S: Feeling better, eager to go home  BP (!) 86/71   Pulse 70   Temp (!) 97.3 F (36.3 C) (Oral)   Resp 18   Ht 5\' 2"  (1.575 m)   Wt 64.5 kg (142 lb 4.8 oz) Comment: scale c  SpO2 97%   BMI 26.03 kg/m   Physical Exam: General: Alert and awake oriented x3 not in any acute distress. HEENT: anicteric sclera, pupils reactive to light and accommodation CVS: S1-S2 clear no murmur rubs or gallops Chest: clear to auscultation bilaterally, no wheezing rales or rhonchi Abdomen: soft nontender, nondistended, normal bowel sounds Extremities: no cyanosis, clubbing or edema noted bilaterally, right lower extremity wound improving Neuro: Cranial nerves II-XII intact, no focal neurological deficits   The results of significant diagnostics from this hospitalization (including imaging, microbiology, ancillary and laboratory) are listed below for reference.      Procedures/Studies:  Dg Chest 2 View  Result Date: 11/20/2017 CLINICAL DATA:  Shortness of breath, worse at night and when lying down. History of CHF. EXAM: CHEST - 2 VIEW COMPARISON:  Chest x-ray dated 11/13/2017, chest x-ray dated 07/05/2017 FINDINGS: Stable cardiomegaly. Lungs are clear. No evidence of active CHF at this time. No  confluent opacity to suggest a developing pneumonia. No pleural effusion or pneumothorax seen. No acute or suspicious osseous finding. IMPRESSION: No active cardiopulmonary disease. No evidence of pneumonia or pulmonary edema. Stable cardiomegaly. Electronically Signed   By: Franki Cabot M.D.   On: 11/20/2017 11:33   Dg Chest 2 View  Result Date: 11/13/2017 CLINICAL DATA:  Shortness of breath for 2-3 weeks. EXAM: CHEST - 2 VIEW COMPARISON:  09/26/2017 FINDINGS: Enlarged cardiac silhouette, not significantly changed. Mediastinal contours appear intact. Calcific atherosclerotic disease of the aorta. There is no evidence of focal airspace consolidation, pleural effusion or pneumothorax. Interstitial pulmonary edema. Osseous structures are without acute abnormality. Soft tissues are grossly normal. IMPRESSION: Enlarged cardiac silhouette which may be due to congestive heart failure or pericardial effusion. Interstitial pulmonary edema. Calcific atherosclerotic disease and tortuosity of the aorta. Electronically Signed   By: Fidela Salisbury M.D.   On: 11/13/2017 16:34       LAB RESULTS: Basic Metabolic Panel: Recent Labs  Lab 11/24/17 0806 11/25/17 0759  NA 142 141  K 4.2 4.5  CL 104 107  CO2 27 22  GLUCOSE 143* 137*  BUN 48* 51*  CREATININE 1.98* 1.79*  CALCIUM 9.1 9.2   Liver Function Tests: No results for input(s): AST, ALT, ALKPHOS, BILITOT, PROT, ALBUMIN in the last 168 hours. No results for input(s): LIPASE, AMYLASE in the last 168 hours. No results for input(s): AMMONIA in the last 168 hours. CBC: Recent Labs  Lab 11/24/17 0453 11/25/17 0759  WBC 4.1 4.4  HGB 14.5 15.0  HCT 45.2 46.8*  MCV 84.5 85.4  PLT 144* 179   Cardiac Enzymes: No results for input(s): CKTOTAL, CKMB, CKMBINDEX, TROPONINI in the last 168 hours. BNP: Invalid input(s): POCBNP CBG: Recent Labs  Lab 11/25/17 0738 11/25/17 1141  GLUCAP 137* 151*      Disposition and Follow-up: Discharge  Instructions    (HEART FAILURE PATIENTS) Call MD:  Anytime you have any of the following symptoms: 1) 3 pound weight gain in 24 hours or 5 pounds in 1 week 2) shortness of breath, with or without a dry hacking cough 3) swelling in the hands, feet or stomach 4) if you have to sleep on extra pillows at night in order to breathe.   Complete by:  As directed    Diet Carb Modified   Complete by:  As directed    Increase activity slowly   Complete by:  As directed        DISPOSITION: [place] Follow up.   Why:  A home health care nurse will continue to see you at your home Contact information: [address] [place] [address] [phone]        [place] Office Follow up.   Specialty:  Cardiology Why:  [facility] - 12/01/17 - please come to the office anytime between 7:30am-4:30pm for LABWORK ONLY (BMET, digoxin level). (Below, the instructions say 2:30pm but you can disregard this and come during the window of time listed here.) Contact information: [address], [address] [place], [place] Follow up.   Specialty:  Cardiology Why:  [facility] - 12/07/17 at [place] is one of the PAs that works closely with Dr. Marlou Porch. Please arrive 15 minutes in advance to check in. Contact information: [address] [place] [address] [phone]        Nelva Bush, MD Follow up.   Specialty:  Cardiology Why:  [facility] - 12/16/17 at [place]. Dr. Saunders Revel is one of the physicians with our team who will evaluate you for possible peripheral vascular disease. Arrive 15 minutes prior to appointment to check in. Contact information: [address] [place] [address] [phone]        Leonard Downing, MD. Go on 12/02/2017.   Specialty:  Family Medicine Why:  @10 :Max Sane  information: [place] [address] [phone]            Time coordinating discharge:  35 mins   Signed:   Estill Cotta M.D. [facility] 11/25/2017, 12:31 PM Pager: [phone]

## 2017-11-25 NOTE — Progress Notes (Addendum)
Pt for dc home, iv removed with tip intact, dressing applied, meds reviewed, pt was started on digoxin and not in AVS, paged Dr Tana Coast to advise of med change. 1230- pt given avs and discharge instructions,discussed f/u appts, medications changes and to stop coreg/carvedilol, pt also advised [facility] will be back to see her, son would not come to room but was at pickup to get her, all questions entertained and answsered by pt, reviewed symptoms to watch for and call md and again reviwed meds she needed to check today

## 2017-11-25 NOTE — Progress Notes (Signed)
ANTICOAGULATION CONSULT NOTE   Pharmacy Consult:  Coumadin Indication: Atrial fibrilation  No Known Allergies  Patient Measurements: Height: 5\' 2"  (157.5 cm) Weight: 142 lb 4.8 oz (64.5 kg)(scale c) IBW/kg (Calculated) : 50.1  Vital Signs: Temp: 98 F (36.7 C) (06/07 0415) Temp Source: Oral (06/07 0415) BP: 114/78 (06/07 0850) Pulse Rate: 88 (06/07 0850)  Labs: Recent Labs    11/23/17 0527 11/24/17 0453 11/24/17 0806 11/25/17 0759  HGB 14.9 14.5  --  15.0  HCT 46.6* 45.2  --  46.8*  PLT 143* 144*  --  179  LABPROT 24.0* 22.6*  --  20.6*  INR 2.17 2.00  --  1.79  CREATININE 1.83* 4.18* 1.98* 1.79*    Estimated Creatinine Clearance: 19.9 mL/min (A) (by C-G formula based on SCr of 1.79 mg/dL (H)).    Assessment: 88 YOF to continue on Coumadin from PTA for history of AFib.  INR decreased to sub-therapeutic level today, possibly a reflection of the 2mg  dose.  Hopeful that INR will increase in AM since yesterday's dose was increased.  No bleeding reported.  Home Coumadin regimen:   4mg  x 2 days, then 2mg  x 2 days and repeat.  INR 1.8 on admit.   Goal of Therapy:  INR 2-3 Monitor platelets by anticoagulation protocol: Yes    Plan:  Coumadin 5mg  PO today Daily PT / INR   Heaven Meeker D. Mina Marble, PharmD, BCPS, BCCCP Pager:  [phone] 11/25/2017, 10:05 AM

## 2017-11-25 NOTE — Progress Notes (Signed)
The following appointments have been arranged per request by Dr. Oval Linsey:  June 13th - pt to come in anytime between 7:30-4:30 for BMET/digoxin level June 19th @ 9am [facility] with Tobe Sos PA-C June 28th @ 8am for New PV Eval with Minta Balsam MD  Put appts on AVS. Melina Copa PA-C

## 2017-11-25 NOTE — Progress Notes (Signed)
Progress Note  Patient Name: Patricia Randall Date of Encounter: 11/25/2017  Primary Cardiologist: Glori Bickers, MD   Subjective   Breathing has improved. Feels ready for discharge.   Inpatient Medications    Scheduled Meds: . cephALEXin  500 mg Oral BID  . cholecalciferol  1,000 Units Oral Daily  . digoxin  0.0625 mg Oral Daily  . feeding supplement (GLUCERNA SHAKE)  237 mL Oral Q24H  . furosemide  40 mg Oral BID  . insulin aspart  0-9 Units Subcutaneous TID WC  . latanoprost  1 drop Both Eyes QHS  . metoprolol succinate  50 mg Oral BID  . pravastatin  40 mg Oral q1800  . silver sulfADIAZINE   Topical Daily  . Warfarin - Pharmacist Dosing Inpatient   Does not apply q1800   Continuous Infusions:  PRN Meds: acetaminophen **OR** acetaminophen, bisacodyl, HYDROcodone-acetaminophen, ondansetron **OR** ondansetron (ZOFRAN) IV, polyethylene glycol   Vital Signs    Vitals:   11/24/17 2027 11/24/17 2028 11/25/17 0415 11/25/17 0850  BP: (!) 128/96 (!) 128/96 139/86 114/78  Pulse: 84 76 72 88  Resp: 17 17 18    Temp: (!) 97.4 F (36.3 C) (!) 97.4 F (36.3 C) 98 F (36.7 C)   TempSrc: Oral Oral Oral   SpO2: 98% 97% 98% 99%  Weight:   142 lb 4.8 oz (64.5 kg)   Height:        Intake/Output Summary (Last 24 hours) at 11/25/2017 0943 Last data filed at 11/25/2017 0813 Gross per 24 hour  Intake 50 ml  Output 700 ml  Net -650 ml   Filed Weights   11/23/17 0356 11/24/17 0349 11/25/17 0415  Weight: 143 lb 11.2 oz (65.2 kg) 141 lb (64 kg) 142 lb 4.8 oz (64.5 kg)    Telemetry    A-fib, rates <100  - Personally Reviewed  ECG    Atrial Fibrillation, ventricular rate of 90 - Personally Reviewed (6/3)  Physical Exam   GEN: No acute distress.   Neck: No JVD Cardiac: RRR, no murmurs, rubs, or gallops.  Respiratory: Clear to auscultation bilaterally. GI: Soft, nontender, non-distended  MS: No edema; No deformity. Extremities: LE wounds well wrapped. Legs are warm, left  lower extremity with faint DP pulse, unable to palpate PT  Neuro:  Nonfocal  Psych: Normal affect   Labs    Chemistry Recent Labs  Lab 11/24/17 0453 11/24/17 0806 11/25/17 0759  NA 142 142 141  K 4.7 4.2 4.5  CL 104 104 107  CO2 23 27 22   GLUCOSE 146* 143* 137*  BUN 63* 48* 51*  CREATININE 4.18* 1.98* 1.79*  CALCIUM 9.2 9.1 9.2  GFRNONAA 9* 22* 25*  GFRAA 10* 25* 28*  ANIONGAP 15 11 12      Hematology Recent Labs  Lab 11/23/17 0527 11/24/17 0453 11/25/17 0759  WBC 4.9 4.1 4.4  RBC 5.45* 5.35* 5.48*  HGB 14.9 14.5 15.0  HCT 46.6* 45.2 46.8*  MCV 85.5 84.5 85.4  MCH 27.3 27.1 27.4  MCHC 32.0 32.1 32.1  RDW 19.9* 19.8* 19.6*  PLT 143* 144* 179    Cardiac EnzymesNo results for input(s): TROPONINI in the last 168 hours.  Recent Labs  Lab 11/20/17 1129  TROPIPOC 0.01     BNP Recent Labs  Lab 11/20/17 1216  BNP 1,151.7*     DDimer No results for input(s): DDIMER in the last 168 hours.   Radiology    No results found.  Cardiac Studies   None  Patient  Profile     84 y.o. female with a pmhx of chronic systolic and diastolic heart failure, chronic atrial fibrillation, CKD III, HTN, HLD, and DM here with heart failure exacerbation.   Assessment & Plan    Acute on Chronic Systolic Heart Failure: Stable on lasix 40 mg BID (increased from home 20 mg BID). -- Continue lasix 40 mg BID -- Follow renal function  -- Beta blocker changed carvedilol -> metoprolol given soft BP. Consolidate today to 100 mg daily. -- No ACEi/ARB given renal function -- Ok for discharge today   CKD Stage III: -- At baseline  Permanent Atrial Fibrillation: -- Rates improved on digoxin -- Continue Digoxin 0.0625 mg daily -- Will need digoxin level checked early next week  -- Cards follow up 1 week  -- Change metoprolol succinate 25 mg BID -> 100 mg daily today  -- Continue coumadin, subtherapeutic today 1.79. Per pharmacy.   Right Lower Extremities Chronic Ulcers: --  Follows with wound care in [place] -- ABIs yesterday with bilateral non compressible arteries indicating severe PVD.  -- Will arrange outpatient vascular follow up -- Continue statin   For questions or updates, please contact [place] Please consult [email or web address] for contact info under Cardiology/STEMI.      Signed, Velna Ochs, MD  11/25/2017, 9:43 AM

## 2017-12-01 ENCOUNTER — Other Ambulatory Visit: Payer: Medicare Other

## 2017-12-06 NOTE — Progress Notes (Signed)
Cardiology Office Note    Date:  12/07/2017   ID:  Patricia Randall, DOB October 04, 1928, MRN [id number]  PCP:  Leonard Downing, MD  Cardiologist:  Dr. Haroldine Laws (last seen 2015) >>> Dr. Marlou Porch  Chief Complaint: Hospital follow up   History of Present Illness:   Patricia Randall is a 84 y.o. female with a hx of persistent atrial fibrillation on coumadin, chronic systolic heart failure with EF of 30-35%, CKD stage III, DM, HTN, HLD, and claudication presents for hospital follow up.   She has a long hx of known HF who has been remotely follow by Dr. Haroldine Laws in the past after a hospital admission for atrial fibrillation with RVR and acute systolic heart failure in 2014. EF at that time was found to be 20-25% in the setting tachycardia with anterior/anteroseptal wall motion abnormalities. She was started on apixaban for the AF and had a TEE-guided DCCV on 04/02/2013. Repeatechocardiogram on 04/2013 with EF 35% with mid/distal inferior and apical akinesis. A subsequent Lexiscan stress was ordered on 05/2013 with EF 42%, global hypokinesis and a small fixed apical defect with no ischemia (low risk).  She was last seen by Dr. Haroldine Laws on 03/13/2014 for follow up. Her last echo at that time was from 10/10/13 which showed an improvement in LVEF to 60-65%.  Recent multiple admission due to acute on chronic CHF. Last echo 09/27/17 showed again reduced LVEF to 30-35% (from 60-65% in 2015) with global hypokinesis during admission. Medical therapy was recommended.  Her A. fib was monitored during her hospitalization and this remained rate controlled.  Last admission 11/2017 with Acute on chronic systolic heart failure in setting of non compliance with diuretics. Felt better after IV diuresis. Recomended importance of weighing herself daily and taking Lasix as prescribed.   Here today for follow up. No concern. Weight stable at home at 146lb. Based on our scale, its 147lb, similar to recent discharge. Has non healing  ulcer on Right leg.  Has mild pain with walking. His appointment wound clinic MD aspirin later today.  She denies shortness of breath, lower extremity edema, orthopnea, PND, syncope, dizziness, melena or blood in his stool or urine.  Compliant with low-sodium diet no  Past Medical History:  Diagnosis Date  . Atrial fibrillation ([place])    a. 03/2013 s/p TEE/DCCV;  b. 03/2013 Eliquis initiated.  . Chronic systolic CHF (congestive heart failure) ([place])    a. 03/2013 Echo: EF 20-25%, diff HK, mild to mod MR, sev dil LA, mild RV dysfxn, sev dil RA, mod TR.  . Claudication ([place])   . Glaucoma   . High cholesterol   . Hypertension   . Type II diabetes mellitus ([place])     Past Surgical History:  Procedure Laterality Date  . CARDIOVERSION N/A 04/02/2013   Procedure: CARDIOVERSION;  Surgeon: Thayer Headings, MD;  Location: [facility];  Service: Cardiovascular;  Laterality: N/A;  Spoke with Patricia Randall   . ESOPHAGOGASTRODUODENOSCOPY N/A 05/26/2016   Procedure: ESOPHAGOGASTRODUODENOSCOPY (EGD);  Surgeon: Ladene Artist, MD;  Location: [facility] ENDOSCOPY;  Service: Endoscopy;  Laterality: N/A;  . EYE SURGERY Right    "had blood in it"   . TEE WITHOUT CARDIOVERSION N/A 04/02/2013   Procedure: TRANSESOPHAGEAL ECHOCARDIOGRAM (TEE);  Surgeon: Thayer Headings, MD;  Location: [facility] ENDOSCOPY;  Service: Cardiovascular;  Laterality: N/A;    Current Medications: Prior to Admission medications   Medication Sig Start Date End Date Taking? Authorizing Provider  cholecalciferol (VITAMIN D) 1000 units tablet Take 1,000  Units by mouth daily.    [provider]  digoxin 62.5 MCG TABS Take 0.0625 mg by mouth daily. 11/26/17   Rai, Ripudeep K, MD  feeding supplement, GLUCERNA SHAKE, (GLUCERNA SHAKE) LIQD Take 237 mLs by mouth daily.    [provider]  furosemide (LASIX) 40 MG tablet Take 1 tablet (40 mg total) by mouth 2 (two) times daily. 11/25/17 05/24/18  Rai, Vernelle Emerald, MD  glimepiride (AMARYL) 4 MG tablet Take 1  tablet (4 mg total) by mouth daily before breakfast. 11/15/17   Emokpae, Courage, MD  latanoprost (XALATAN) 0.005 % ophthalmic solution Place 1 drop into both eyes at bedtime.    [provider]  lovastatin (MEVACOR) 40 MG tablet Take 1 tablet (40 mg total) by mouth at bedtime. 11/15/17   Roxan Hockey, MD  metoprolol succinate (TOPROL-XL) 100 MG 24 hr tablet Take 1 tablet (100 mg total) by mouth daily. Take with or immediately following a meal. 11/25/17   Rai, Ripudeep K, MD  Multiple Vitamins-Minerals (HAIR/SKIN/NAILS/BIOTIN PO) Take 1 tablet by mouth daily.    [provider]  polyethylene glycol (MIRALAX / GLYCOLAX) packet Take 17 g by mouth daily as needed for mild constipation. 09/29/17   Lavina Hamman, MD  silver sulfADIAZINE (SILVADENE) 1 % cream Apply to affected area daily Patient not taking: Reported on 11/13/2017 09/30/17 09/30/18  Lavina Hamman, MD  warfarin (COUMADIN) 2 MG tablet Take 1 tablet (2 mg total) by mouth daily. Patient taking differently: Take 2-4 mg by mouth See admin instructions. Take 2 tablets (4mg ) by mouth daily after breakfast for two days then 1 tablet (2mg ) next day after breakfast, then repeat (2,2,1) 05/31/16   Nita Sells, MD    Allergies:   Patient has no known allergies.   Social History   Socioeconomic History  . Marital status: Widowed    Spouse name: Not on file  . Number of children: Not on file  . Years of education: Not on file  . Highest education level: Not on file  Occupational History  . Not on file  Social Needs  . Financial resource strain: Not on file  . Food insecurity:    Worry: Not on file    Inability: Not on file  . Transportation needs:    Medical: Not on file    Non-medical: Not on file  Tobacco Use  . Smoking status: Never Smoker  . Smokeless tobacco: Never Used  Substance and Sexual Activity  . Alcohol use: No  . Drug use: No  . Sexual activity: Never  Lifestyle  . Physical activity:     Days per week: Not on file    Minutes per session: Not on file  . Stress: Not on file  Relationships  . Social connections:    Talks on phone: Not on file    Gets together: Not on file    Attends religious service: Not on file    Active member of club or organization: Not on file    Attends meetings of clubs or organizations: Not on file    Relationship status: Not on file  Other Topics Concern  . Not on file  Social History Narrative   Lives in [place] with her son.     Family History:  The patient's family history includes Diabetes in her brother; Other in her father, mother, and unknown relative.   ROS:   Please see the history of present illness.    ROS All other systems reviewed  and are negative.   PHYSICAL EXAM:   VS:  BP 138/78   Pulse (!) 56   Ht 5\' 2"  (1.575 m)   Wt 147 lb 9.6 oz (67 kg)   BMI 27.00 kg/m    GEN: Well nourished, well developed, in no acute distress  HEENT: normal  Neck: no JVD, carotid bruits, or masses Cardiac: RRR; no murmurs, rubs, or gallops,no edema Respiratory:  clear to auscultation bilaterally, normal work of breathing GI: soft, nontender, nondistended, + BS MS: no deformity or atrophy. R leg wound Skin: warm and dry, no rash Neuro:  Alert and Oriented x 3, Strength and sensation are intact Psych: euthymic mood, full affect  Wt Readings from Last 3 Encounters:  12/07/17 147 lb 9.6 oz (67 kg)  11/25/17 142 lb 4.8 oz (64.5 kg)  11/15/17 147 lb (66.7 kg)      Studies/Labs Reviewed:   EKG:  EKG is not ordered today.    Recent Labs: 09/26/2017: ALT 21 11/13/2017: Magnesium 1.6 11/20/2017: B Natriuretic Peptide 1,151.7 11/25/2017: BUN 51; Creatinine, Ser 1.79; Hemoglobin 15.0; Platelets 179; Potassium 4.5; Sodium 141   Lipid Panel    Component Value Date/Time   CHOL 124 11/25/2017 0759   TRIG 105 11/25/2017 0759   HDL 42 11/25/2017 0759   CHOLHDL 3.0 11/25/2017 0759   VLDL 21 11/25/2017 0759   LDLCALC 61 11/25/2017 0759     Additional studies/ records that were reviewed today include:   Echocardiogram: 09/27/17 ------------------------------------------------------------------- Study Conclusions  - Left ventricle: The cavity size was normal. Wall thickness was   normal. Systolic function was moderately to severely reduced. The   estimated ejection fraction was in the range of 30% to 35%.   Diffuse hypokinesis. The study is not technically sufficient to   allow evaluation of LV diastolic function. - Mitral valve: Mildly thickened leaflets . There was moderate   regurgitation. - Left atrium: Moderately dilated. - Right atrium: The atrium was at the upper limits of normal in   size. - Tricuspid valve: There was moderate regurgitation. - Pulmonary arteries: PA peak pressure: 42 mm Hg (S). - Inferior vena cava: The vessel was normal in size. The   respirophasic diameter changes were in the normal range (>= 50%),   consistent with normal central venous pressure.  Impressions:  - Compared to a prior study in 2015, the LVEF has decreased to   30-35% with global hypokinesis - atrial fibrillation is present.   There is moderate LAE.   VAS [place] ABI ABI Findings: +--------+------------------+-----+---------+--------+ Right  Rt Pressure (mmHg)IndexWaveform Comment  +--------+------------------+-----+---------+--------+ [id number]           triphasic     +--------+------------------+-----+---------+--------+ PTA   254        1.95 biphasic      +--------+------------------+-----+---------+--------+ DP   254        1.95 biphasic      +--------+------------------+-----+---------+--------+  +--------+------------------+-----+---------+-------+ Left  Lt Pressure (mmHg)IndexWaveform Comment +--------+------------------+-----+---------+-------+ [id number]           triphasic      +--------+------------------+-----+---------+-------+ PTA   254        1.95 triphasic     +--------+------------------+-----+---------+-------+ DP   254        1.95 biphasic      +--------+------------------+-----+---------+-------+   Final Interpretation: Right: Resting right ankle-brachial index indicates noncompressible right lower extremity arteries.  Unable to obtain TBI's due to low amplitude waveforms. Left: Resting left ankle-brachial index indicates noncompressible left lower extremity arteries.  ASSESSMENT & PLAN:    1. Chronic systolic CHF Euvolemic. Weight has been stable since last discharge. Continue current medication. Check BMET.   2. Persistent atrial fibrillation  - Rate controlled. Coumadin followed by PCP. No bleeding issue.   3. HTN - Stable on current meds.   4. HLD - 11/25/2017: Cholesterol 124; HDL 42; LDL Cholesterol 61; Triglycerides 105; VLDL 21  - Continue statin  5. Non healing R leg ulcer - Has appointment with wound clinic later today. Recent resting bilateral ankle-brachial index indicates noncompressible lower extremity arteries. Reviewed with Dr. Marlou Porch. Will refer for PV evaluation.     Medication Adjustments/Labs and Tests Ordered: Current medicines are reviewed at length with the patient today.  Concerns regarding medicines are outlined above.  Medication changes, Labs and Tests ordered today are listed in the Patient Instructions below. Patient Instructions  Medication Instructions:   Your physician recommends that you continue on your current medications as directed. Please refer to the Current Medication list given to you today.   If you need a refill on your cardiac medications before your next appointment, please call your pharmacy.   Labwork: BMET TODAY    Testing/Procedures: NONE ORDERED  TODAY     Follow-Up: WITH DR BERRY OR DR ARIDA  NEXT AVAILABLE  FOR CLAUDICATION    Any  Other Special Instructions Will Be Listed Below (If Applicable).  MAKE SURE YOU FOLLOW UP WITH PCP OR INR CHECKS                                                                                                                                                     Jarrett Soho, [place]  12/07/2017 10:40 AM    [address], [place], [place]  [address] Phone: [phone]; Fax: [phone]

## 2017-12-07 ENCOUNTER — Ambulatory Visit (INDEPENDENT_AMBULATORY_CARE_PROVIDER_SITE_OTHER): Payer: Medicare Other | Admitting: Physician Assistant

## 2017-12-07 ENCOUNTER — Encounter: Payer: Self-pay | Admitting: Physician Assistant

## 2017-12-07 VITALS — BP 138/78 | HR 56 | Ht 62.0 in | Wt 147.6 lb

## 2017-12-07 DIAGNOSIS — I5022 Chronic systolic (congestive) heart failure: Secondary | ICD-10-CM | POA: Diagnosis not present

## 2017-12-07 DIAGNOSIS — L98499 Non-pressure chronic ulcer of skin of other sites with unspecified severity: Secondary | ICD-10-CM | POA: Diagnosis not present

## 2017-12-07 DIAGNOSIS — I481 Persistent atrial fibrillation: Secondary | ICD-10-CM | POA: Diagnosis not present

## 2017-12-07 DIAGNOSIS — Z79899 Other long term (current) drug therapy: Secondary | ICD-10-CM | POA: Diagnosis not present

## 2017-12-07 DIAGNOSIS — I10 Essential (primary) hypertension: Secondary | ICD-10-CM | POA: Diagnosis not present

## 2017-12-07 DIAGNOSIS — E782 Mixed hyperlipidemia: Secondary | ICD-10-CM

## 2017-12-07 DIAGNOSIS — I4819 Other persistent atrial fibrillation: Secondary | ICD-10-CM

## 2017-12-07 LAB — BASIC METABOLIC PANEL
BUN / CREAT RATIO: 25 (ref 12–28)
BUN: 57 mg/dL — ABNORMAL HIGH (ref 8–27)
CO2: 25 mmol/L (ref 20–29)
CREATININE: 2.29 mg/dL — AB (ref 0.57–1.00)
Calcium: 9.5 mg/dL (ref 8.7–10.3)
Chloride: 99 mmol/L (ref 96–106)
GFR, EST AFRICAN AMERICAN: 22 mL/min/{1.73_m2} — AB (ref >59)
GFR, EST NON AFRICAN AMERICAN: 19 mL/min/{1.73_m2} — AB (ref >59)
Glucose: 128 mg/dL — ABNORMAL HIGH (ref 65–99)
POTASSIUM: 4.4 mmol/L (ref 3.5–5.2)
SODIUM: 141 mmol/L (ref 134–144)

## 2017-12-07 NOTE — Patient Instructions (Addendum)
Medication Instructions:   Your physician recommends that you continue on your current medications as directed. Please refer to the Current Medication list given to you today.   If you need a refill on your cardiac medications before your next appointment, please call your pharmacy.   Labwork: BMET TODAY    Testing/Procedures: NONE ORDERED  TODAY     Follow-Up: WITH DR BERRY OR DR ARIDA  NEXT AVAILABLE  FOR CLAUDICATION    Any Other Special Instructions Will Be Listed Below (If Applicable).  MAKE SURE YOU FOLLOW UP WITH PCP OR INR CHECKS

## 2017-12-13 ENCOUNTER — Telehealth: Payer: Self-pay | Admitting: *Deleted

## 2017-12-13 DIAGNOSIS — R7989 Other specified abnormal findings of blood chemistry: Secondary | ICD-10-CM

## 2017-12-13 MED ORDER — FUROSEMIDE 40 MG PO TABS: ORAL_TABLET | ORAL | 3 refills | Status: DC

## 2017-12-13 NOTE — Telephone Encounter (Signed)
-----   Message from [place], [place] sent at 12/07/2017  4:12 PM EDT ----- Renal function has worsen. Reduce lasix to 40mg  AM and 20mg  PM. Refer to nephrologist. If she has seen by nephrologist, please fax lab. Repeat lab in 3 weeks. She may take extra 20mg  of lasix of weight gain, call us if required > 2 times/week.

## 2017-12-16 ENCOUNTER — Ambulatory Visit: Payer: Medicare Other | Admitting: Internal Medicine

## 2017-12-20 ENCOUNTER — Ambulatory Visit: Payer: Medicare Other | Admitting: Cardiovascular Disease

## 2018-01-02 ENCOUNTER — Other Ambulatory Visit: Payer: Medicare Other

## 2018-01-25 ENCOUNTER — Ambulatory Visit: Payer: Medicare Other | Admitting: Cardiology

## 2018-02-07 ENCOUNTER — Ambulatory Visit (INDEPENDENT_AMBULATORY_CARE_PROVIDER_SITE_OTHER): Payer: Medicare Other | Admitting: Cardiology

## 2018-02-07 ENCOUNTER — Encounter (INDEPENDENT_AMBULATORY_CARE_PROVIDER_SITE_OTHER): Payer: Self-pay

## 2018-02-07 ENCOUNTER — Encounter: Payer: Self-pay | Admitting: Cardiology

## 2018-02-07 VITALS — BP 112/70 | HR 60 | Ht 62.0 in | Wt 147.1 lb

## 2018-02-07 DIAGNOSIS — I481 Persistent atrial fibrillation: Secondary | ICD-10-CM

## 2018-02-07 DIAGNOSIS — E119 Type 2 diabetes mellitus without complications: Secondary | ICD-10-CM | POA: Diagnosis not present

## 2018-02-07 DIAGNOSIS — I5022 Chronic systolic (congestive) heart failure: Secondary | ICD-10-CM

## 2018-02-07 DIAGNOSIS — I10 Essential (primary) hypertension: Secondary | ICD-10-CM | POA: Diagnosis not present

## 2018-02-07 DIAGNOSIS — I4819 Other persistent atrial fibrillation: Secondary | ICD-10-CM

## 2018-02-07 LAB — BASIC METABOLIC PANEL
BUN/Creatinine Ratio: 24 (ref 12–28)
BUN: 44 mg/dL — ABNORMAL HIGH (ref 8–27)
CHLORIDE: 101 mmol/L (ref 96–106)
CO2: 24 mmol/L (ref 20–29)
Calcium: 9.2 mg/dL (ref 8.7–10.3)
Creatinine, Ser: 1.8 mg/dL — ABNORMAL HIGH (ref 0.57–1.00)
GFR calc non Af Amer: 25 mL/min/{1.73_m2} — ABNORMAL LOW (ref >59)
GFR, EST AFRICAN AMERICAN: 29 mL/min/{1.73_m2} — AB (ref >59)
Glucose: 123 mg/dL — ABNORMAL HIGH (ref 65–99)
POTASSIUM: 4 mmol/L (ref 3.5–5.2)
SODIUM: 142 mmol/L (ref 134–144)

## 2018-02-07 NOTE — Progress Notes (Signed)
Cardiology Office Note:    Date:  02/07/2018   ID:  Patricia Randall, DOB 04/06/1929, MRN [id number]  PCP:  Leonard Downing, MD  Cardiologist:  Nelva Bush, MD   Referring MD: Leonard Downing, *     History of Present Illness:    Patricia Randall is a 84 y.o. female with chronic systolic heart failure, ejection fraction 30%, diabetes with hypertension and hyperlipidemia, permanent atrial fibrillation, CKD 3 here for follow-up.  I saw her last in hospitalization on 09/30/2017.  At that time she was battling with acute on chronic systolic heart failure.  Her EF was 30%.  Original EF was 20% several years ago but it had returned to normal at one point.  She was having edema in her lower extremities which was resolved during hospital stay.  ACE inhibitor was held because of acute kidney injury.  She had good rate control with her atrial fibrillation.  She had failed cardioversion in the past.  Coumadin.  She was also battling with a nonhealing right ulcer, wound clinic.  PV evaluation recommended/she was referred by Vin during prior appointment on 12/07/2017.  Her renal function had slightly worsened and Lasix was reduced to 40 a.m. 20 p.m. and a referral to nephrology had taken place.  Overall she has been doing quite well.  No significant shortness of breath.  Her wound seems to be improving she states.  No chest pain fevers chills nausea vomiting orthopnea.  She has had some transportation issues.  Past Medical History:  Diagnosis Date  . Atrial fibrillation ([place])    a. 03/2013 s/p TEE/DCCV;  b. 03/2013 Eliquis initiated.  . Chronic systolic CHF (congestive heart failure) ([place])    a. 03/2013 Echo: EF 20-25%, diff HK, mild to mod MR, sev dil LA, mild RV dysfxn, sev dil RA, mod TR.  . Claudication ([place])   . Glaucoma   . High cholesterol   . Hypertension   . Type II diabetes mellitus ([place])     Past Surgical History:  Procedure Laterality Date  . CARDIOVERSION N/A 04/02/2013   Procedure: CARDIOVERSION;  Surgeon: Thayer Headings, MD;  Location: [facility];  Service: Cardiovascular;  Laterality: N/A;  Spoke with Tom   . ESOPHAGOGASTRODUODENOSCOPY N/A 05/26/2016   Procedure: ESOPHAGOGASTRODUODENOSCOPY (EGD);  Surgeon: Ladene Artist, MD;  Location: [facility] ENDOSCOPY;  Service: Endoscopy;  Laterality: N/A;  . EYE SURGERY Right    "had blood in it"   . TEE WITHOUT CARDIOVERSION N/A 04/02/2013   Procedure: TRANSESOPHAGEAL ECHOCARDIOGRAM (TEE);  Surgeon: Thayer Headings, MD;  Location: [facility] ENDOSCOPY;  Service: Cardiovascular;  Laterality: N/A;    Current Medications: Current Meds  Medication Sig  . ACCU-CHEK AVIVA PLUS test strip TO CHECK BLOOD GLUCOSE ONCE DAILY **DX CODE: E11.9**  . ACCU-CHEK SOFTCLIX LANCETS lancets USE TO CHECK BLOOD GLUCOSE ONCE DAILY **DX CODE: E11.9**  . Blood Glucose Monitoring Suppl (ACCU-CHEK AVIVA PLUS) w/Device KIT TO CHECK BLOOD GLUCOSE ONCE DAILY **DX CODE: E11.9**  . cholecalciferol (VITAMIN D) 1000 units tablet Take 1,000 Units by mouth daily.  . digoxin (LANOXIN) 0.125 MG tablet TAKE 1/2 (ONE HALF) TABLET BY MOUTH EVERY OTHER DAY  . feeding supplement, GLUCERNA SHAKE, (GLUCERNA SHAKE) LIQD Take 237 mLs by mouth daily.  . furosemide (LASIX) 40 MG tablet Take 1 tablet by mouth in the a.m, take 1/2 tablet by mouth in the afternoon.  Marland Kitchen glimepiride (AMARYL) 4 MG tablet Take 1 tablet (4 mg total) by mouth daily before breakfast.  .  latanoprost (XALATAN) 0.005 % ophthalmic solution Place 1 drop into both eyes at bedtime.  . lovastatin (MEVACOR) 40 MG tablet Take 1 tablet (40 mg total) by mouth at bedtime.  . metoprolol succinate (TOPROL-XL) 100 MG 24 hr tablet Take 1 tablet (100 mg total) by mouth daily. Take with or immediately following a meal.  . polyethylene glycol (MIRALAX / GLYCOLAX) packet Take 17 g by mouth daily as needed for mild constipation.  Marland Kitchen warfarin (COUMADIN) 2 MG tablet Take 2 mg by mouth as directed.  . [DISCONTINUED] digoxin  62.5 MCG TABS Take 0.0625 mg by mouth daily.     Allergies:   Patient has no known allergies.   Social History   Socioeconomic History  . Marital status: Widowed    Spouse name: Not on file  . Number of children: Not on file  . Years of education: Not on file  . Highest education level: Not on file  Occupational History  . Not on file  Social Needs  . Financial resource strain: Not on file  . Food insecurity:    Worry: Not on file    Inability: Not on file  . Transportation needs:    Medical: Not on file    Non-medical: Not on file  Tobacco Use  . Smoking status: Never Smoker  . Smokeless tobacco: Never Used  Substance and Sexual Activity  . Alcohol use: No  . Drug use: No  . Sexual activity: Never  Lifestyle  . Physical activity:    Days per week: Not on file    Minutes per session: Not on file  . Stress: Not on file  Relationships  . Social connections:    Talks on phone: Not on file    Gets together: Not on file    Attends religious service: Not on file    Active member of club or organization: Not on file    Attends meetings of clubs or organizations: Not on file    Relationship status: Not on file  Other Topics Concern  . Not on file  Social History Narrative   Lives in [place] with her son.     Family History: The patient's family history includes Diabetes in her brother; Other in her father, mother, and unknown relative.  ROS:   Please see the history of present illness.     All other systems reviewed and are negative.  EKGs/Labs/Other Studies Reviewed:    The following studies were reviewed today: Prior hospital records, lab work, EKG, echocardiogram personally reviewed  EKG:  None today  Recent Labs: 09/26/2017: ALT 21 11/13/2017: Magnesium 1.6 11/20/2017: B Natriuretic Peptide 1,151.7 11/25/2017: Hemoglobin 15.0; Platelets 179 12/07/2017: BUN 57; Creatinine, Ser 2.29; Potassium 4.4; Sodium 141  Recent Lipid Panel    Component Value Date/Time    CHOL 124 11/25/2017 0759   TRIG 105 11/25/2017 0759   HDL 42 11/25/2017 0759   CHOLHDL 3.0 11/25/2017 0759   VLDL 21 11/25/2017 0759   LDLCALC 61 11/25/2017 0759    Physical Exam:    VS:  BP 112/70   Pulse 60   Ht _0  (1.575 m)   Wt 147 lb 1.9 oz (66.7 kg)   SpO2 97%   BMI 26.91 kg/m     Wt Readings from Last 3 Encounters:  02/07/18 147 lb 1.9 oz (66.7 kg)  12/07/17 147 lb 9.6 oz (67 kg)  11/25/17 142 lb 4.8 oz (64.5 kg)     GEN: Elderly,  well nourished, well developed  in no acute distress HEENT: Normal NECK: No JVD; No carotid bruits LYMPHATICS: No lymphadenopathy CARDIAC: IRRR, no murmurs, rubs, gallops RESPIRATORY:  Clear to auscultation without rales, wheezing or rhonchi  ABDOMEN: Soft, non-tender, non-distended MUSCULOSKELETAL:  No edema; No deformity  SKIN: Warm and dry NEUROLOGIC:  Alert and oriented x 3 PSYCHIATRIC:  Normal affect   ASSESSMENT:    1. Chronic systolic CHF (congestive heart failure) (H[facility])   2. Persistent atrial fibrillation ([place])   3. Essential hypertension   4. Diabetes mellitus with coincident hypertension ([place])    PLAN:    In order of problems listed above:  Chronic systolic heart failure -EF 30%.  Not on ACE inhibitor because of chronic kidney disease. NYHA 1-2 Good overall rate control with Toprol 100 mg.  Low-dose digoxin every other day.  Doing much better, stable.  Low salt, low fluid.  Chronic kidney disease stage III/IV - Lasix had to be reduced to 40/20 -Nephrology referral.  She has had some trouble getting to an appointment because of transportation issues.  Her son was on dialysis at one point.  She states that she would not want to go on dialysis.  She is concerned about this. Agree with lowering digoxin dose to every other day given her age and chronic kidney disease.  Permanent atrial fibrillation - Continuing with Coumadin.  Dr. Arelia Sneddon.  Rate controlled.  On Toprol as well as every other day low-dose  digoxin.  Nonhealing leg ulcer -PV referral was put in place, but at this point she is seeing the wound clinic.  She states that this is improving.  She was able to walk to her church function for the first time.  Diabetes with hypertension and hyperlipidemia - Continue to optimize.  Care per Dr. Arelia Sneddon.  BMET today. 6th f/u Mickel Baas, 14 with me.    Medication Adjustments/Labs and Tests Ordered: Current medicines are reviewed at length with the patient today.  Concerns regarding medicines are outlined above.  Orders Placed This Encounter  Procedures  . Basic metabolic panel   No orders of the defined types were placed in this encounter.   Patient Instructions  Medication Instructions:  The current medical regimen is effective;  continue present plan and medications.  Labwork: Please have blood work today (BMP)  Follow-Up: Follow up in 6 months with Cecilie Kicks, NP.  You will receive a letter in the mail 2 months before you are due.  Please call us when you receive this letter to schedule your follow up appointment.  Follow up in 1 year with Dr. Marlou Porch.  You will receive a letter in the mail 2 months before you are due.  Please call us when you receive this letter to schedule your follow up appointment.  If you need a refill on your cardiac medications before your next appointment, please call your pharmacy.  Thank you for choosing [facility]!!        Signed, Candee Furbish, MD  02/07/2018 10:30 AM    [facility]

## 2018-02-07 NOTE — Patient Instructions (Signed)
Medication Instructions:  The current medical regimen is effective;  continue present plan and medications.  Labwork: Please have blood work today (BMP)  Follow-Up: Follow up in 6 months with Laura Ingold, NP.  You will receive a letter in the mail 2 months before you are due.  Please call us when you receive this letter to schedule your follow up appointment.  Follow up in 1 year with Dr. Skains.  You will receive a letter in the mail 2 months before you are due.  Please call us when you receive this letter to schedule your follow up appointment.  If you need a refill on your cardiac medications before your next appointment, please call your pharmacy.  Thank you for choosing [facility]!!     

## 2018-10-02 ENCOUNTER — Telehealth: Payer: Self-pay | Admitting: Cardiology

## 2018-10-02 NOTE — Telephone Encounter (Signed)
New message     Called pt to schedule recall visit.  Pt states that she does not have a cell phone or email and it is ok to call her and follow up with her over the phone.

## 2018-10-02 NOTE — Telephone Encounter (Signed)
Phone visit is fine.     Virtual Visit Pre-Appointment Phone Call  Steps For Call:  1. Confirm consent - "In the setting of the current Covid19 crisis, you are scheduled for a (phone or video) visit with your provider on (date) at (time).  Just as we do with many in-office visits, in order for you to participate in this visit, we must obtain consent.  If you'd like, I can send this to your mychart (if signed up) or email for you to review.  Otherwise, I can obtain your verbal consent now.  All virtual visits are billed to your insurance company just like a normal visit would be.  By agreeing to a virtual visit, we'd like you to understand that the technology does not allow for your provider to perform an examination, and thus may limit your provider's ability to fully assess your condition.  Finally, though the technology is pretty good, we cannot assure that it will always work on either your or our end, and in the setting of a video visit, we may have to convert it to a phone-only visit.  In either situation, we cannot ensure that we have a secure connection.  Are you willing to proceed?"  2. Give patient instructions for WebEx download to smartphone as below if video visit  3. Advise patient to be prepared with any vital sign or heart rhythm information, their current medicines, and a piece of paper and pen handy for any instructions they may receive the day of their visit  4. Inform patient they will receive a phone call 15 minutes prior to their appointment time (may be from unknown caller ID) so they should be prepared to answer  5. Confirm that appointment type is correct in Epic appointment notes (video vs telephone)    TELEPHONE CALL NOTE  Patricia Randall has been deemed a candidate for a follow-up tele-health visit to limit community exposure during the Covid-19 pandemic. I spoke with the patient via phone to ensure availability of phone/video source, confirm preferred email & phone  number, and discuss instructions and expectations.  I reminded Patricia Randall to be prepared with any vital sign and/or heart rhythm information that could potentially be obtained via home monitoring, at the time of her visit. I reminded Patricia Randall to expect a phone call at the time of her visit if her visit.  Did the patient verbally acknowledge consent to treatment? 10/05/2018  Patricia Randall, [place] 10/02/2018 10:27 AM   DOWNLOADING THE WEBEX SOFTWARE TO SMARTPHONE  - If Apple, go to [employer] and type in WebEx in the search bar. [place] [employer], the blue/green circle. The app is free but as with any other app downloads, their phone may require them to verify saved payment information or Apple password. The patient does NOT have to create an account.  - If Android, ask patient to go to [employer] and type in WebEx in the search bar. [place] [employer], the blue/green circle. The app is free but as with any other app downloads, their phone may require them to verify saved payment information or Android password. The patient does NOT have to create an account.   CONSENT FOR TELE-HEALTH VISIT - PLEASE REVIEW  I hereby voluntarily request, consent and authorize [facility] and its employed or contracted physicians, physician assistants, nurse practitioners or other licensed health care professionals (the Practitioner), to provide me with telemedicine health care services (the "Services") as deemed necessary by the  treating Practitioner. I acknowledge and consent to receive the Services by the Practitioner via telemedicine. I understand that the telemedicine visit will involve communicating with the Practitioner through live audiovisual communication technology and the disclosure of certain medical information by electronic transmission. I acknowledge that I have been given the opportunity to request an in-person assessment or other available alternative prior to the  telemedicine visit and am voluntarily participating in the telemedicine visit.  I understand that I have the right to withhold or withdraw my consent to the use of telemedicine in the course of my care at any time, without affecting my right to future care or treatment, and that the Practitioner or I may terminate the telemedicine visit at any time. I understand that I have the right to inspect all information obtained and/or recorded in the course of the telemedicine visit and may receive copies of available information for a reasonable fee.  I understand that some of the potential risks of receiving the Services via telemedicine include:  Marland Kitchen Delay or interruption in medical evaluation due to technological equipment failure or disruption; . Information transmitted may not be sufficient (e.g. poor resolution of images) to allow for appropriate medical decision making by the Practitioner; and/or  . In rare instances, security protocols could fail, causing a breach of personal health information.  Furthermore, I acknowledge that it is my responsibility to provide information about my medical history, conditions and care that is complete and accurate to the best of my ability. I acknowledge that Practitioner's advice, recommendations, and/or decision may be based on factors not within their control, such as incomplete or inaccurate data provided by me or distortions of diagnostic images or specimens that may result from electronic transmissions. I understand that the practice of medicine is not an exact science and that Practitioner makes no warranties or guarantees regarding treatment outcomes. I acknowledge that I will receive a copy of this consent concurrently upon execution via email to the email address I last provided but may also request a printed copy by calling the office of [facility].    I understand that my insurance will be billed for this visit.   I have read or had this consent read to me.  . I understand the contents of this consent, which adequately explains the benefits and risks of the Services being provided via telemedicine.  . I have been provided ample opportunity to ask questions regarding this consent and the Services and have had my questions answered to my satisfaction. . I give my informed consent for the services to be provided through the use of telemedicine in my medical care  By participating in this telemedicine visit I agree to the above.

## 2018-10-04 NOTE — Progress Notes (Signed)
Virtual Visit via Telephone Note   This visit type was conducted due to national recommendations for restrictions regarding the COVID-19 Pandemic (e.g. social distancing) in an effort to limit this patient's exposure and mitigate transmission in our community.  Due to her co-morbid illnesses, this patient is at least at moderate risk for complications without adequate follow up.  This format is felt to be most appropriate for this patient at this time.  The patient did not have access to video technology/had technical difficulties with video requiring transitioning to audio format only (telephone).  All issues noted in this document were discussed and addressed.  No physical exam could be performed with this format.  Please refer to the patient's chart for her  consent to telehealth for [facility].   Evaluation Performed:  Follow-up visit  Date:  10/05/2018   ID:  Patricia Randall, DOB 1929-04-15, MRN [id number]  Patient Location: Home Provider Location: Office  PCP:  Leonard Downing, MD  Cardiologist:  Candee Furbish, MD  Electrophysiologist:  None   Chief Complaint:  heart failure  History of Present Illness:    Patricia Randall is a 85 y.o. female with permanent a fib.  She is on coumadin.  Hx of chronic systolic heart failure with EF of 30-35%, CKD stage III, DM, HTN, HLD, and claudication.   Last seen in the office 02/07/18   acute on chronic systolic heart failure.  Her EF was 30%.  Original EF was 20% several years ago but it had returned to normal at one point.  She was having edema in her lower extremities which was resolved during hospital stay.  ACE inhibitor was held because of acute kidney injury.  She had good rate control with her atrial fibrillation.  She had failed cardioversion in the past.  Coumadin.  She was also battling with a nonhealing right ulcer, wound clinic.  PV evaluation recommended/she was referred by Vin during prior appointment on 12/07/2017.  She has seen  nephrology for for CKD 3 and no on ACE due to this.    Today Patricia Randall is doing well.  She has no chest pain and no SOB.  She was seen by Dr. Arelia Sneddon on the 14th. And her coumadin level was checked.  He has been following her kidney function as well per pt.  She has no awareness of heart beat. No rapid HR.  She denies any blood in her stool or urine.  No lightheadedness.  Her leg ulcer has healed and she has no further edema.  She does not need to prop up at night to sleep.     Recently diagnosed by Dr. Arelia Sneddon with gout her finger was swollen and her ring needed to be cut off.   Now improved.  She never saw [place] physician.  But again her ulcer has healed.  She also never saw nephrology due to lack of transportation.    She is somewhat off balance with walking so for exercise we discussed taking cans of beans and lifting over her head back and forth to increase HR and help improve muscle mass. she is eating healthy using supplements like equate.  She does buy her own groceries and is wearing a mask and gloves with this.      The patient does not have symptoms concerning for COVID-19 infection (fever, chills, cough, or new shortness of breath).    Past Medical History:  Diagnosis Date  . Atrial fibrillation ([place])    a. 03/2013 s/p  TEE/DCCV;  b. 03/2013 Eliquis initiated.  . Chronic systolic CHF (congestive heart failure) ([place])    a. 03/2013 Echo: EF 20-25%, diff HK, mild to mod MR, sev dil LA, mild RV dysfxn, sev dil RA, mod TR.  . Claudication ([place])   . Glaucoma   . High cholesterol   . Hypertension   . Type II diabetes mellitus ([place])    Past Surgical History:  Procedure Laterality Date  . CARDIOVERSION N/A 04/02/2013   Procedure: CARDIOVERSION;  Surgeon: Thayer Headings, MD;  Location: [facility];  Service: Cardiovascular;  Laterality: N/A;  Spoke with Tom   . ESOPHAGOGASTRODUODENOSCOPY N/A 05/26/2016   Procedure: ESOPHAGOGASTRODUODENOSCOPY (EGD);  Surgeon: Ladene Artist, MD;  Location:  [facility] ENDOSCOPY;  Service: Endoscopy;  Laterality: N/A;  . EYE SURGERY Right    "had blood in it"   . TEE WITHOUT CARDIOVERSION N/A 04/02/2013   Procedure: TRANSESOPHAGEAL ECHOCARDIOGRAM (TEE);  Surgeon: Thayer Headings, MD;  Location: [facility];  Service: Cardiovascular;  Laterality: N/A;     Current Meds  Medication Sig  . ACCU-CHEK AVIVA PLUS test strip TO CHECK BLOOD GLUCOSE ONCE DAILY **DX CODE: E11.9**  . ACCU-CHEK SOFTCLIX LANCETS lancets USE TO CHECK BLOOD GLUCOSE ONCE DAILY **DX CODE: E11.9**  . Blood Glucose Monitoring Suppl (ACCU-CHEK AVIVA PLUS) w/Device KIT TO CHECK BLOOD GLUCOSE ONCE DAILY **DX CODE: E11.9**  . cholecalciferol (VITAMIN D) 1000 units tablet Take 1,000 Units by mouth daily.  . colchicine 0.6 MG tablet Take 0.6 mg by mouth as needed.  . digoxin (LANOXIN) 0.125 MG tablet Take 0.125 mg by mouth daily. Take 1/2 tab daily  . feeding supplement, GLUCERNA SHAKE, (GLUCERNA SHAKE) LIQD Take 237 mLs by mouth daily.  . furosemide (LASIX) 40 MG tablet Take 40 mg by mouth daily.  Marland Kitchen glimepiride (AMARYL) 4 MG tablet Take 1 tablet (4 mg total) by mouth daily before breakfast.  . latanoprost (XALATAN) 0.005 % ophthalmic solution Place 1 drop into both eyes at bedtime.  . lovastatin (MEVACOR) 40 MG tablet Take 1 tablet (40 mg total) by mouth at bedtime.  . metoprolol succinate (TOPROL-XL) 100 MG 24 hr tablet Take 1 tablet (100 mg total) by mouth daily. Take with or immediately following a meal.  . polyethylene glycol (MIRALAX / GLYCOLAX) packet Take 17 g by mouth daily as needed for mild constipation.  Marland Kitchen warfarin (COUMADIN) 2 MG tablet Take 2 mg by mouth as directed.     Allergies:   Patient has no known allergies.   Social History   Tobacco Use  . Smoking status: Never Smoker  . Smokeless tobacco: Never Used  Substance Use Topics  . Alcohol use: No  . Drug use: No     Family Hx: The patient's family history includes Diabetes in her brother; Other in her father,  mother, and unknown relative.  ROS:   Please see the history of present illness.    General:no colds or fevers, no weight changes Skin:no rashes or ulcers HEENT:no blurred vision, no congestion CV:see HPI PUL:see HPI GI:no diarrhea constipation or melena, no indigestion GU:no hematuria, no dysuria MS:no joint pain, no claudication Neuro:no syncope, no lightheadedness, somewhat off balance with walking. Does not like to use a cane. Endo:+ diabetes, no thyroid disease  All other systems reviewed and are negative.   Prior CV studies:   The following studies were reviewed today:  Echo 09/27/17 Study Conclusions  - Left ventricle: The cavity size was normal. Wall thickness was   normal. Systolic  function was moderately to severely reduced. The   estimated ejection fraction was in the range of 30% to 35%.   Diffuse hypokinesis. The study is not technically sufficient to   allow evaluation of LV diastolic function. - Mitral valve: Mildly thickened leaflets . There was moderate   regurgitation. - Left atrium: Moderately dilated. - Right atrium: The atrium was at the upper limits of normal in   size. - Tricuspid valve: There was moderate regurgitation. - Pulmonary arteries: PA peak pressure: 42 mm Hg (S). - Inferior vena cava: The vessel was normal in size. The   respirophasic diameter changes were in the normal range (>= 50%),   consistent with normal central venous pressure.  Impressions:  - Compared to a prior study in 2015, the LVEF has decreased to   30-35% with global hypokinesis - atrial fibrillation is present.   There is moderate LAE.  PV lower ext dopplers 11/24/17 Final Interpretation: Right: Resting right ankle-brachial index indicates noncompressible right lower extremity arteries.  Unable to obtain TBI's due to low amplitude waveforms. Left: Resting left ankle-brachial index indicates noncompressible left lower extremity arteries.  Unable to obtain TBI's due  to low amplitude waveforms.     Labs/Other Tests and Data Reviewed:    EKG:  An ECG dated 11/20/17 was personally reviewed today and demonstrated:  a fib with HR 92 deep T wave inversion in V5-6   Recent Labs: 11/13/2017: Magnesium 1.6 11/20/2017: B Natriuretic Peptide 1,151.7 11/25/2017: Hemoglobin 15.0; Platelets 179 02/07/2018: BUN 44; Creatinine, Ser 1.80; Potassium 4.0; Sodium 142   Recent Lipid Panel Lab Results  Component Value Date/Time   CHOL 124 11/25/2017 07:59 AM   TRIG 105 11/25/2017 07:59 AM   HDL 42 11/25/2017 07:59 AM   CHOLHDL 3.0 11/25/2017 07:59 AM   LDLCALC 61 11/25/2017 07:59 AM    Wt Readings from Last 3 Encounters:  10/05/18 154 lb (69.9 kg)  02/07/18 147 lb 1.9 oz (66.7 kg)  12/07/17 147 lb 9.6 oz (67 kg)     Objective:    Vital Signs:  Ht '5\' 2"'  (1.575 m)   Wt 154 lb (69.9 kg)   BMI 28.17 kg/m   Pt had no way to take BP but was stable with Dr. Arelia Sneddon on the 14th.     female in no acute distress. Lungs without SOB with talking, could complete sentences without stopping for breath. Neuro alert and oriented X 3 PSYCH pleasant affect.  Bright and cheerful.   ASSESSMENT & PLAN:    1. Permanent a fib, no complaints of racing HR. No chest pain on dig daily. On toprol 100 mg daily.  2. Anticoagulation on coumadin per Dr. Arelia Sneddon no bleeding 3. Chronic systolic HF  EF 36%. Currently per pt no edema and no SOB, none on phone conversation.  On lasix 40 mg daily.  4. HTN stable per pt 5. CKD 3 followed by Dr. Arelia Sneddon pt did not see nephrologist due to transportation issues.  Lasix has been reduced  6. HLD followed by Dr. Arelia Sneddon  On mevacor  7. Leg ulcer has resolved 8. PAD did not see peripheral MD for eval.  currently asymptomatic   I do not find her records in [facility] so will contact Dr. Hendricks Limes for records.  COVID-19 Education: The signs and symptoms of COVID-19 were discussed with the patient and how to seek care for testing (follow up with PCP or  arrange E-visit).  The importance of social distancing was discussed today.  Time:   Today, I have spent 14 minutes with the patient with telehealth technology discussing the above problems.     Medication Adjustments/Labs and Tests Ordered: Current medicines are reviewed at length with the patient today.  Concerns regarding medicines are outlined above.   Tests Ordered: No orders of the defined types were placed in this encounter.   Medication Changes: No orders of the defined types were placed in this encounter.    Disposition:  Follow up in 6 month(s) with Dr. Marlou Porch.  Signed, Cecilie Kicks, NP  10/05/2018 11:06 AM    [place]

## 2018-10-05 ENCOUNTER — Other Ambulatory Visit: Payer: Self-pay

## 2018-10-05 ENCOUNTER — Encounter: Payer: Self-pay | Admitting: Cardiology

## 2018-10-05 ENCOUNTER — Telehealth (INDEPENDENT_AMBULATORY_CARE_PROVIDER_SITE_OTHER): Payer: Medicare Other | Admitting: Cardiology

## 2018-10-05 VITALS — Ht 62.0 in | Wt 154.0 lb

## 2018-10-05 DIAGNOSIS — I4819 Other persistent atrial fibrillation: Secondary | ICD-10-CM | POA: Diagnosis not present

## 2018-10-05 DIAGNOSIS — I5022 Chronic systolic (congestive) heart failure: Secondary | ICD-10-CM

## 2018-10-05 DIAGNOSIS — I10 Essential (primary) hypertension: Secondary | ICD-10-CM

## 2018-10-05 DIAGNOSIS — I739 Peripheral vascular disease, unspecified: Secondary | ICD-10-CM

## 2018-10-05 DIAGNOSIS — E782 Mixed hyperlipidemia: Secondary | ICD-10-CM

## 2019-06-01 IMAGING — CR DG CHEST 2V
2 series · 2 of 2 positions shown · non-contrast
Comparison: Chest x-ray of July 04, 2015

CLINICAL DATA: One week of shortness of breath made worse when
supine. History of hypertension, CHF, atrial fibrillation.

EXAM:
CHEST  2 VIEW

[chest pa]
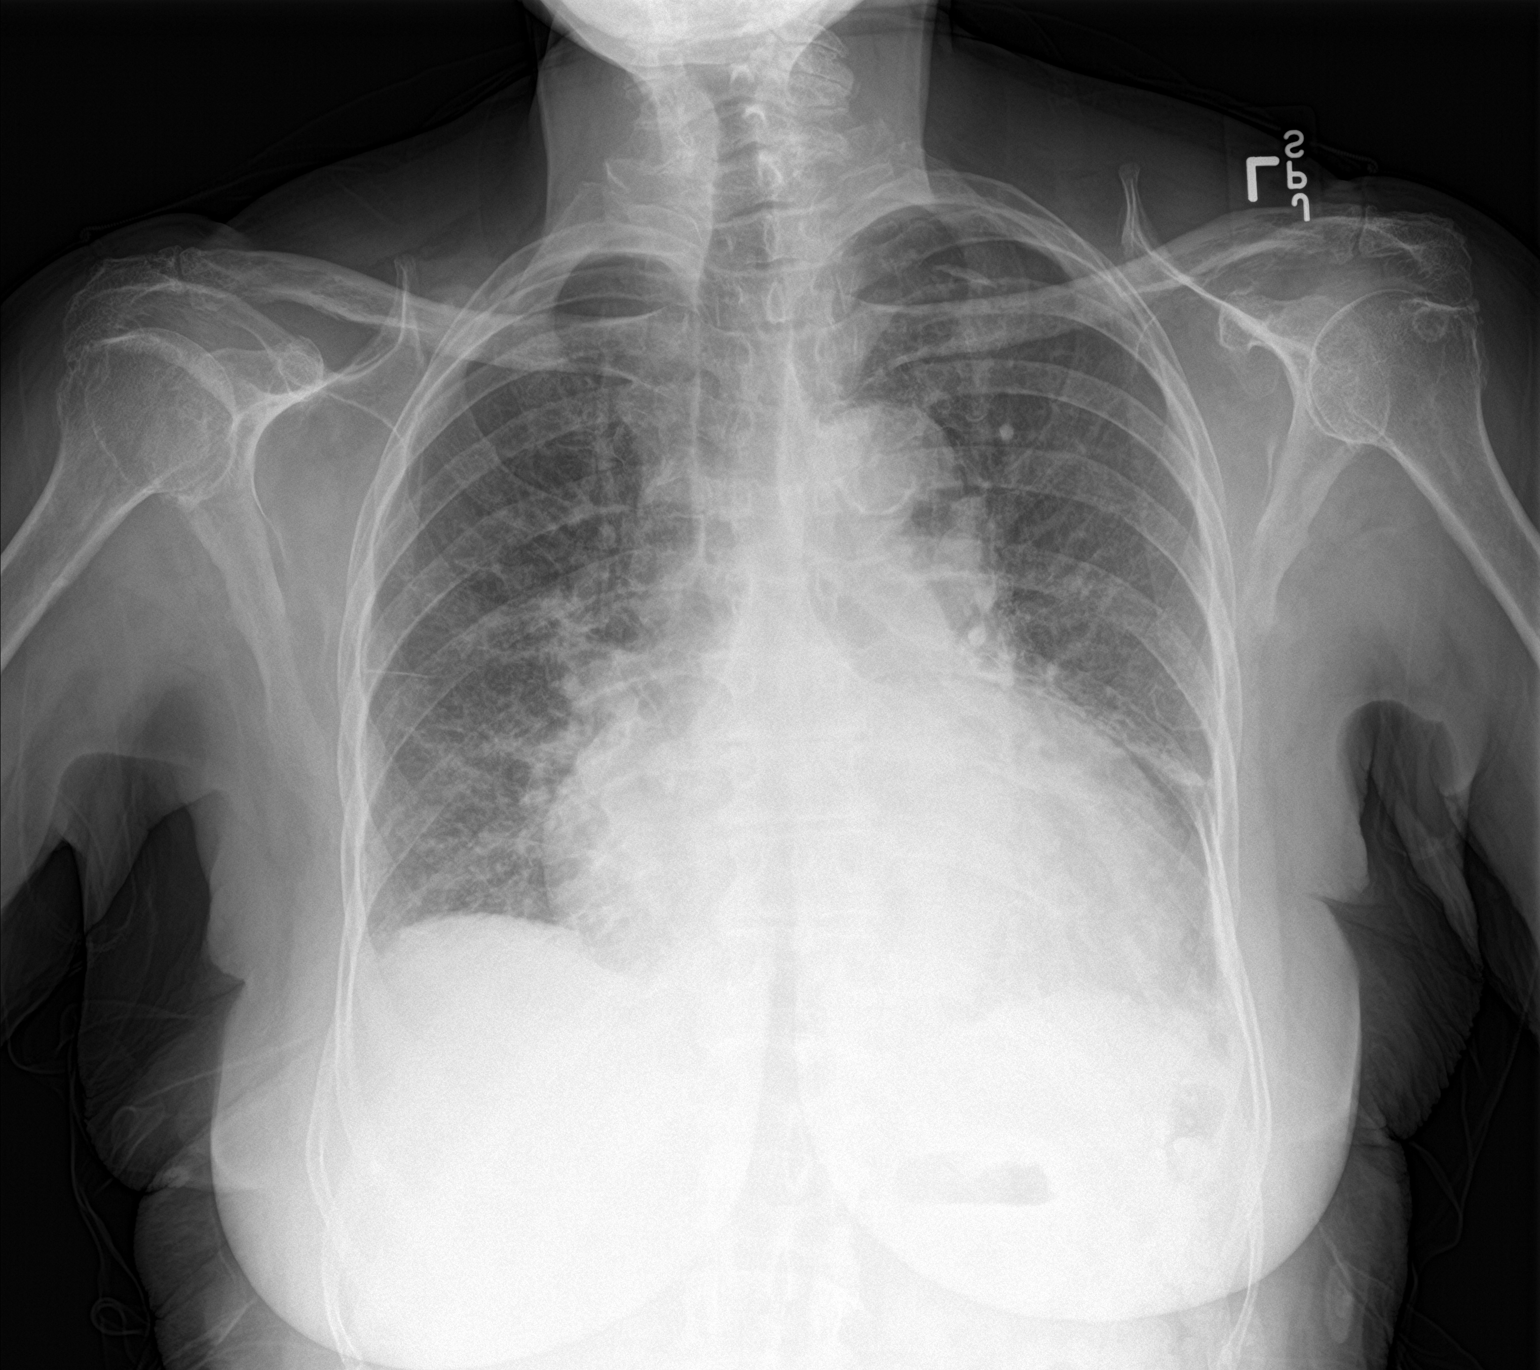

[chest lat]
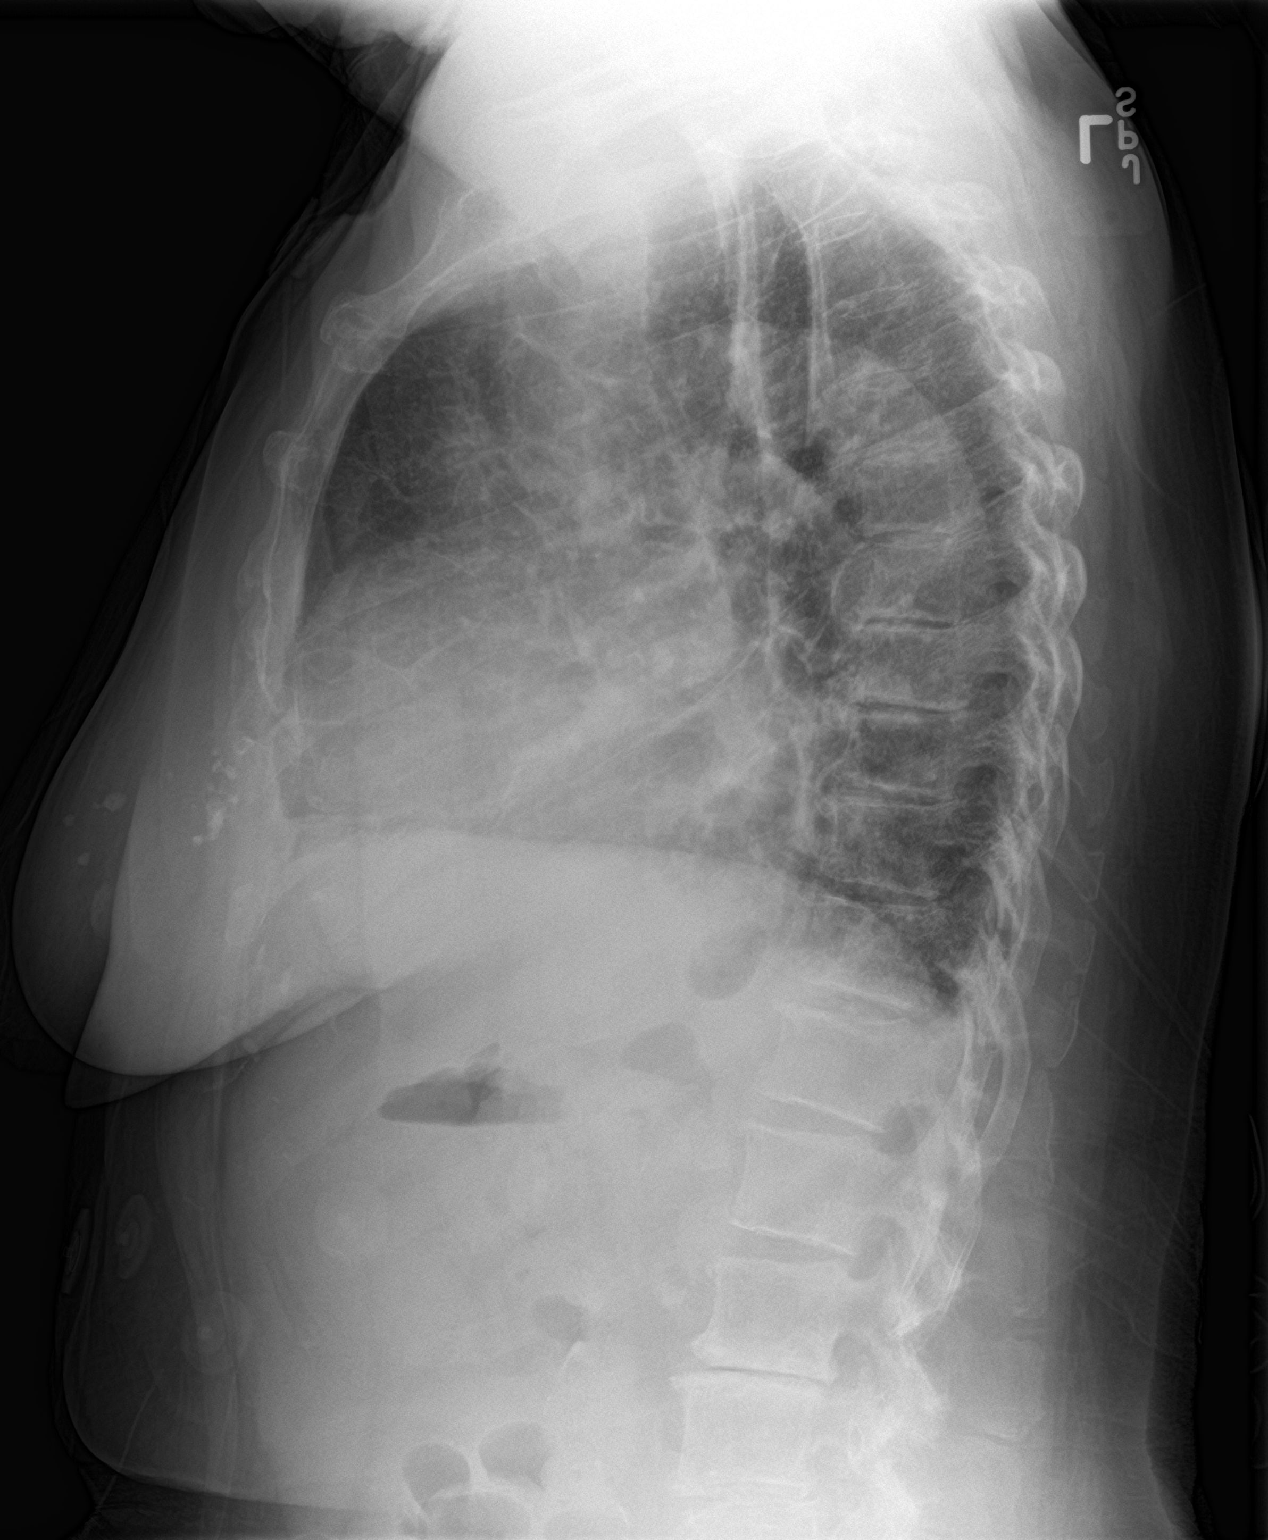

[2 of 2 positions shown; findings below may reference images not displayed]

FINDINGS: The lungs are adequately inflated. The interstitial markings are
increased. The cardiac silhouette is enlarged. There is a trace of
pleural fluid blunting the costophrenic angles. There is no alveolar
infiltrate. There calcification in the wall of the aortic arch.
There is multilevel degenerative disc disease of the thoracic spine.
IMPRESSION: CHF with mild interstitial edema.  No acute pneumonia.

Thoracic aortic atherosclerosis.

## 2019-07-04 ENCOUNTER — Inpatient Hospital Stay (HOSPITAL_COMMUNITY)
Admission: EM | Admit: 2019-07-04 | Discharge: 2019-07-18 | DRG: 177 | Disposition: A | Discharge status: Hospice | Payer: Medicare Other | Attending: Internal Medicine | Admitting: Internal Medicine

## 2019-07-04 DIAGNOSIS — R627 Adult failure to thrive: Secondary | ICD-10-CM | POA: Diagnosis present

## 2019-07-04 DIAGNOSIS — I10 Essential (primary) hypertension: Secondary | ICD-10-CM | POA: Diagnosis present

## 2019-07-04 DIAGNOSIS — E1165 Type 2 diabetes mellitus with hyperglycemia: Secondary | ICD-10-CM | POA: Diagnosis not present

## 2019-07-04 DIAGNOSIS — E1151 Type 2 diabetes mellitus with diabetic peripheral angiopathy without gangrene: Secondary | ICD-10-CM | POA: Diagnosis present

## 2019-07-04 DIAGNOSIS — T380X5A Adverse effect of glucocorticoids and synthetic analogues, initial encounter: Secondary | ICD-10-CM | POA: Diagnosis not present

## 2019-07-04 DIAGNOSIS — Z841 Family history of disorders of kidney and ureter: Secondary | ICD-10-CM

## 2019-07-04 DIAGNOSIS — Z833 Family history of diabetes mellitus: Secondary | ICD-10-CM

## 2019-07-04 DIAGNOSIS — E872 Acidosis: Secondary | ICD-10-CM | POA: Diagnosis not present

## 2019-07-04 DIAGNOSIS — I4821 Permanent atrial fibrillation: Secondary | ICD-10-CM | POA: Diagnosis present

## 2019-07-04 DIAGNOSIS — U071 COVID-19: Secondary | ICD-10-CM | POA: Diagnosis not present

## 2019-07-04 DIAGNOSIS — M6281 Muscle weakness (generalized): Secondary | ICD-10-CM

## 2019-07-04 DIAGNOSIS — Z7984 Long term (current) use of oral hypoglycemic drugs: Secondary | ICD-10-CM

## 2019-07-04 DIAGNOSIS — D649 Anemia, unspecified: Secondary | ICD-10-CM | POA: Diagnosis present

## 2019-07-04 DIAGNOSIS — I132 Hypertensive heart and chronic kidney disease with heart failure and with stage 5 chronic kidney disease, or end stage renal disease: Secondary | ICD-10-CM | POA: Diagnosis present

## 2019-07-04 DIAGNOSIS — I340 Nonrheumatic mitral (valve) insufficiency: Secondary | ICD-10-CM | POA: Diagnosis present

## 2019-07-04 DIAGNOSIS — Z515 Encounter for palliative care: Secondary | ICD-10-CM | POA: Diagnosis not present

## 2019-07-04 DIAGNOSIS — Z6829 Body mass index (BMI) 29.0-29.9, adult: Secondary | ICD-10-CM

## 2019-07-04 DIAGNOSIS — N179 Acute kidney failure, unspecified: Secondary | ICD-10-CM | POA: Diagnosis present

## 2019-07-04 DIAGNOSIS — E875 Hyperkalemia: Secondary | ICD-10-CM | POA: Diagnosis not present

## 2019-07-04 DIAGNOSIS — I5022 Chronic systolic (congestive) heart failure: Secondary | ICD-10-CM | POA: Diagnosis present

## 2019-07-04 DIAGNOSIS — N186 End stage renal disease: Secondary | ICD-10-CM | POA: Diagnosis present

## 2019-07-04 DIAGNOSIS — E1122 Type 2 diabetes mellitus with diabetic chronic kidney disease: Secondary | ICD-10-CM | POA: Diagnosis present

## 2019-07-04 DIAGNOSIS — I480 Paroxysmal atrial fibrillation: Secondary | ICD-10-CM | POA: Diagnosis present

## 2019-07-04 DIAGNOSIS — R791 Abnormal coagulation profile: Secondary | ICD-10-CM | POA: Diagnosis not present

## 2019-07-04 DIAGNOSIS — E1129 Type 2 diabetes mellitus with other diabetic kidney complication: Secondary | ICD-10-CM | POA: Diagnosis present

## 2019-07-04 DIAGNOSIS — Y92230 Patient room in hospital as the place of occurrence of the external cause: Secondary | ICD-10-CM | POA: Diagnosis not present

## 2019-07-04 DIAGNOSIS — R64 Cachexia: Secondary | ICD-10-CM | POA: Diagnosis present

## 2019-07-04 DIAGNOSIS — E119 Type 2 diabetes mellitus without complications: Secondary | ICD-10-CM

## 2019-07-04 DIAGNOSIS — R531 Weakness: Secondary | ICD-10-CM | POA: Diagnosis not present

## 2019-07-04 DIAGNOSIS — Z66 Do not resuscitate: Secondary | ICD-10-CM | POA: Diagnosis not present

## 2019-07-04 DIAGNOSIS — Z79899 Other long term (current) drug therapy: Secondary | ICD-10-CM

## 2019-07-04 DIAGNOSIS — Z7189 Other specified counseling: Secondary | ICD-10-CM

## 2019-07-04 DIAGNOSIS — E860 Dehydration: Secondary | ICD-10-CM | POA: Diagnosis present

## 2019-07-04 DIAGNOSIS — E663 Overweight: Secondary | ICD-10-CM | POA: Diagnosis present

## 2019-07-04 DIAGNOSIS — Z7901 Long term (current) use of anticoagulants: Secondary | ICD-10-CM

## 2019-07-04 DIAGNOSIS — E785 Hyperlipidemia, unspecified: Secondary | ICD-10-CM | POA: Diagnosis present

## 2019-07-04 DIAGNOSIS — I429 Cardiomyopathy, unspecified: Secondary | ICD-10-CM | POA: Diagnosis present

## 2019-07-04 DIAGNOSIS — H409 Unspecified glaucoma: Secondary | ICD-10-CM | POA: Diagnosis present

## 2019-07-04 NOTE — ED Triage Notes (Signed)
Pt comes via [facility] for the past two days has had loss of appetite and weakness, no BM, denies n/v/d

## 2019-07-05 ENCOUNTER — Other Ambulatory Visit: Payer: Self-pay

## 2019-07-05 ENCOUNTER — Emergency Department (HOSPITAL_COMMUNITY): Payer: Medicare Other

## 2019-07-05 ENCOUNTER — Encounter (HOSPITAL_COMMUNITY): Payer: Self-pay

## 2019-07-05 DIAGNOSIS — Z66 Do not resuscitate: Secondary | ICD-10-CM | POA: Diagnosis not present

## 2019-07-05 DIAGNOSIS — R531 Weakness: Secondary | ICD-10-CM | POA: Diagnosis present

## 2019-07-05 DIAGNOSIS — R627 Adult failure to thrive: Secondary | ICD-10-CM | POA: Diagnosis present

## 2019-07-05 DIAGNOSIS — E1129 Type 2 diabetes mellitus with other diabetic kidney complication: Secondary | ICD-10-CM

## 2019-07-05 DIAGNOSIS — N186 End stage renal disease: Secondary | ICD-10-CM | POA: Diagnosis present

## 2019-07-05 DIAGNOSIS — I429 Cardiomyopathy, unspecified: Secondary | ICD-10-CM | POA: Diagnosis present

## 2019-07-05 DIAGNOSIS — E1151 Type 2 diabetes mellitus with diabetic peripheral angiopathy without gangrene: Secondary | ICD-10-CM | POA: Diagnosis present

## 2019-07-05 DIAGNOSIS — E785 Hyperlipidemia, unspecified: Secondary | ICD-10-CM | POA: Diagnosis present

## 2019-07-05 DIAGNOSIS — E860 Dehydration: Secondary | ICD-10-CM | POA: Diagnosis present

## 2019-07-05 DIAGNOSIS — H409 Unspecified glaucoma: Secondary | ICD-10-CM | POA: Diagnosis present

## 2019-07-05 DIAGNOSIS — I340 Nonrheumatic mitral (valve) insufficiency: Secondary | ICD-10-CM | POA: Diagnosis present

## 2019-07-05 DIAGNOSIS — T380X5A Adverse effect of glucocorticoids and synthetic analogues, initial encounter: Secondary | ICD-10-CM | POA: Diagnosis not present

## 2019-07-05 DIAGNOSIS — I480 Paroxysmal atrial fibrillation: Secondary | ICD-10-CM | POA: Diagnosis present

## 2019-07-05 DIAGNOSIS — U071 COVID-19: Principal | ICD-10-CM

## 2019-07-05 DIAGNOSIS — N179 Acute kidney failure, unspecified: Secondary | ICD-10-CM | POA: Diagnosis present

## 2019-07-05 DIAGNOSIS — N189 Chronic kidney disease, unspecified: Secondary | ICD-10-CM | POA: Diagnosis not present

## 2019-07-05 DIAGNOSIS — I5022 Chronic systolic (congestive) heart failure: Secondary | ICD-10-CM | POA: Diagnosis present

## 2019-07-05 DIAGNOSIS — D649 Anemia, unspecified: Secondary | ICD-10-CM | POA: Diagnosis present

## 2019-07-05 DIAGNOSIS — R64 Cachexia: Secondary | ICD-10-CM | POA: Diagnosis present

## 2019-07-05 DIAGNOSIS — E872 Acidosis: Secondary | ICD-10-CM | POA: Diagnosis not present

## 2019-07-05 DIAGNOSIS — I4821 Permanent atrial fibrillation: Secondary | ICD-10-CM

## 2019-07-05 DIAGNOSIS — I132 Hypertensive heart and chronic kidney disease with heart failure and with stage 5 chronic kidney disease, or end stage renal disease: Secondary | ICD-10-CM | POA: Diagnosis present

## 2019-07-05 DIAGNOSIS — R791 Abnormal coagulation profile: Secondary | ICD-10-CM | POA: Diagnosis not present

## 2019-07-05 DIAGNOSIS — E1122 Type 2 diabetes mellitus with diabetic chronic kidney disease: Secondary | ICD-10-CM | POA: Diagnosis present

## 2019-07-05 DIAGNOSIS — E1165 Type 2 diabetes mellitus with hyperglycemia: Secondary | ICD-10-CM | POA: Diagnosis not present

## 2019-07-05 DIAGNOSIS — Y92230 Patient room in hospital as the place of occurrence of the external cause: Secondary | ICD-10-CM | POA: Diagnosis not present

## 2019-07-05 DIAGNOSIS — E875 Hyperkalemia: Secondary | ICD-10-CM | POA: Diagnosis not present

## 2019-07-05 DIAGNOSIS — Z515 Encounter for palliative care: Secondary | ICD-10-CM | POA: Diagnosis not present

## 2019-07-05 LAB — CBC
HCT: 48.4 % — ABNORMAL HIGH (ref 36.0–46.0)
Hemoglobin: 15.6 g/dL — ABNORMAL HIGH (ref 12.0–15.0)
MCH: 28.5 pg (ref 26.0–34.0)
MCHC: 32.2 g/dL (ref 30.0–36.0)
MCV: 88.5 fL (ref 80.0–100.0)
Platelets: 233 10*3/uL (ref 150–400)
RBC: 5.47 MIL/uL — ABNORMAL HIGH (ref 3.87–5.11)
RDW: 15.2 % (ref 11.5–15.5)
WBC: 4.7 10*3/uL (ref 4.0–10.5)
nRBC: 0 % (ref 0.0–0.2)

## 2019-07-05 LAB — COMPREHENSIVE METABOLIC PANEL
ALT: 15 U/L (ref 0–44)
ALT: 15 U/L (ref 0–44)
AST: 27 U/L (ref 15–41)
AST: 24 U/L (ref 15–41)
Albumin: 2.3 g/dL — ABNORMAL LOW (ref 3.5–5.0)
Albumin: 2.1 g/dL — ABNORMAL LOW (ref 3.5–5.0)
Alkaline Phosphatase: 65 U/L (ref 38–126)
Alkaline Phosphatase: 61 U/L (ref 38–126)
Anion gap: 15 (ref 5–15)
Anion gap: 18 — ABNORMAL HIGH (ref 5–15)
BUN: 57 mg/dL — ABNORMAL HIGH (ref 8–23)
BUN: 63 mg/dL — ABNORMAL HIGH (ref 8–23)
CO2: 22 mmol/L (ref 22–32)
CO2: 22 mmol/L (ref 22–32)
Calcium: 8.2 mg/dL — ABNORMAL LOW (ref 8.9–10.3)
Calcium: 8.1 mg/dL — ABNORMAL LOW (ref 8.9–10.3)
Chloride: 101 mmol/L (ref 98–111)
Chloride: 98 mmol/L (ref 98–111)
Creatinine, Ser: 3.42 mg/dL — ABNORMAL HIGH (ref 0.44–1.00)
Creatinine, Ser: 3.58 mg/dL — ABNORMAL HIGH (ref 0.44–1.00)
GFR calc Af Amer: 13 mL/min — ABNORMAL LOW (ref >60)
GFR calc Af Amer: 12 mL/min — ABNORMAL LOW (ref >60)
GFR calc non Af Amer: 11 mL/min — ABNORMAL LOW (ref >60)
GFR calc non Af Amer: 11 mL/min — ABNORMAL LOW (ref >60)
Glucose, Bld: 148 mg/dL — ABNORMAL HIGH (ref 70–99)
Glucose, Bld: 183 mg/dL — ABNORMAL HIGH (ref 70–99)
Potassium: 4.5 mmol/L (ref 3.5–5.1)
Potassium: 4.8 mmol/L (ref 3.5–5.1)
Sodium: 138 mmol/L (ref 135–145)
Sodium: 138 mmol/L (ref 135–145)
Total Bilirubin: 0.9 mg/dL (ref 0.3–1.2)
Total Bilirubin: 1.2 mg/dL (ref 0.3–1.2)
Total Protein: 6.7 g/dL (ref 6.5–8.1)
Total Protein: 6 g/dL — ABNORMAL LOW (ref 6.5–8.1)

## 2019-07-05 LAB — URINALYSIS, ROUTINE W REFLEX MICROSCOPIC
Bilirubin Urine: NEGATIVE
Glucose, UA: NEGATIVE mg/dL
Ketones, ur: NEGATIVE mg/dL
Leukocytes,Ua: NEGATIVE
Nitrite: NEGATIVE
Protein, ur: >=300 mg/dL — AB
Specific Gravity, Urine: 1.024 (ref 1.005–1.030)
pH: 5 (ref 5.0–8.0)

## 2019-07-05 LAB — CBC WITH DIFFERENTIAL/PLATELET
Abs Immature Granulocytes: 0.08 10*3/uL — ABNORMAL HIGH (ref 0.00–0.07)
Basophils Absolute: 0 10*3/uL (ref 0.0–0.1)
Basophils Relative: 1 %
Eosinophils Absolute: 0 10*3/uL (ref 0.0–0.5)
Eosinophils Relative: 0 %
HCT: 48.5 % — ABNORMAL HIGH (ref 36.0–46.0)
Hemoglobin: 15.6 g/dL — ABNORMAL HIGH (ref 12.0–15.0)
Immature Granulocytes: 2 %
Lymphocytes Relative: 13 %
Lymphs Abs: 0.6 10*3/uL — ABNORMAL LOW (ref 0.7–4.0)
MCH: 27.9 pg (ref 26.0–34.0)
MCHC: 32.2 g/dL (ref 30.0–36.0)
MCV: 86.8 fL (ref 80.0–100.0)
Monocytes Absolute: 0.4 10*3/uL (ref 0.1–1.0)
Monocytes Relative: 8 %
Neutro Abs: 3.4 10*3/uL (ref 1.7–7.7)
Neutrophils Relative %: 76 %
Platelets: 233 10*3/uL (ref 150–400)
RBC: 5.59 MIL/uL — ABNORMAL HIGH (ref 3.87–5.11)
RDW: 15.3 % (ref 11.5–15.5)
WBC: 4.4 10*3/uL (ref 4.0–10.5)
nRBC: 0 % (ref 0.0–0.2)

## 2019-07-05 LAB — TROPONIN I (HIGH SENSITIVITY): Troponin I (High Sensitivity): 35 ng/L — ABNORMAL HIGH (ref <18)

## 2019-07-05 LAB — GLUCOSE, CAPILLARY
Glucose-Capillary: 179 mg/dL — ABNORMAL HIGH (ref 70–99)
Glucose-Capillary: 213 mg/dL — ABNORMAL HIGH (ref 70–99)
Glucose-Capillary: 161 mg/dL — ABNORMAL HIGH (ref 70–99)
Glucose-Capillary: 78 mg/dL (ref 70–99)

## 2019-07-05 LAB — PROCALCITONIN: Procalcitonin: 0.19 ng/mL

## 2019-07-05 LAB — PROTIME-INR
INR: 3.1 — ABNORMAL HIGH (ref 0.8–1.2)
Prothrombin Time: 32 seconds — ABNORMAL HIGH (ref 11.4–15.2)

## 2019-07-05 LAB — FERRITIN: Ferritin: 91 ng/mL (ref 11–307)

## 2019-07-05 LAB — MAGNESIUM: Magnesium: 1.8 mg/dL (ref 1.7–2.4)

## 2019-07-05 LAB — POC SARS CORONAVIRUS 2 AG -  ED: SARS Coronavirus 2 Ag: POSITIVE — AB

## 2019-07-05 LAB — DIGOXIN LEVEL: Digoxin Level: 0.8 ng/mL (ref 0.8–2.0)

## 2019-07-05 LAB — C-REACTIVE PROTEIN: CRP: 4.8 mg/dL — ABNORMAL HIGH (ref <1.0)

## 2019-07-05 LAB — LIPASE, BLOOD: Lipase: 25 U/L (ref 11–51)

## 2019-07-05 LAB — D-DIMER, QUANTITATIVE: D-Dimer, Quant: 0.56 ug/mL-FEU — ABNORMAL HIGH (ref 0.00–0.50)

## 2019-07-05 MED ORDER — ONDANSETRON HCL 4 MG PO TABS
4.0000 mg | ORAL_TABLET | Freq: Four times a day (QID) | ORAL | Status: DC | PRN
  Filled 2019-07-05: qty 1

## 2019-07-05 MED ORDER — DIGOXIN 125 MCG PO TABS
0.0625 mg | ORAL_TABLET | ORAL | Status: DC
  Administered 2019-07-05 – 2019-07-07 (×2): 0.0625 mg via ORAL
  Filled 2019-07-05 (×2): qty 1

## 2019-07-05 MED ORDER — WARFARIN - PHARMACIST DOSING INPATIENT: Freq: Every day | Status: DC

## 2019-07-05 MED ORDER — GLUCERNA SHAKE PO LIQD
237.0000 mL | Freq: Two times a day (BID) | ORAL | Status: DC
  Administered 2019-07-05: 237 mL via ORAL

## 2019-07-05 MED ORDER — INSULIN ASPART 100 UNIT/ML ~~LOC~~ SOLN
0.0000 [IU] | Freq: Three times a day (TID) | SUBCUTANEOUS | Status: DC
  Administered 2019-07-05: 3 [IU] via SUBCUTANEOUS
  Administered 2019-07-05 – 2019-07-09 (×5): 2 [IU] via SUBCUTANEOUS
  Administered 2019-07-09 – 2019-07-10 (×3): 1 [IU] via SUBCUTANEOUS
  Administered 2019-07-14 – 2019-07-15 (×4): 2 [IU] via SUBCUTANEOUS
  Administered 2019-07-16 (×2): 1 [IU] via SUBCUTANEOUS
  Administered 2019-07-16: 3 [IU] via SUBCUTANEOUS
  Administered 2019-07-17: 2 [IU] via SUBCUTANEOUS
  Administered 2019-07-17: 3 [IU] via SUBCUTANEOUS
  Administered 2019-07-17: 2 [IU] via SUBCUTANEOUS

## 2019-07-05 MED ORDER — ONDANSETRON HCL 4 MG/2ML IJ SOLN
4.0000 mg | Freq: Four times a day (QID) | INTRAMUSCULAR | Status: DC | PRN
  Administered 2019-07-06 – 2019-07-08 (×2): 4 mg via INTRAVENOUS
  Filled 2019-07-05 (×2): qty 2

## 2019-07-05 MED ORDER — ONDANSETRON 4 MG PO TBDP
4.0000 mg | ORAL_TABLET | Freq: Once | ORAL | Status: AC
  Administered 2019-07-05: 4 mg via ORAL
  Filled 2019-07-05: qty 1

## 2019-07-05 MED ORDER — METOPROLOL SUCCINATE ER 100 MG PO TB24
100.0000 mg | ORAL_TABLET | Freq: Every day | ORAL | Status: DC
  Administered 2019-07-05 – 2019-07-09 (×5): 100 mg via ORAL
  Filled 2019-07-05 (×5): qty 1

## 2019-07-05 MED ORDER — ACETAMINOPHEN 325 MG PO TABS
650.0000 mg | ORAL_TABLET | Freq: Four times a day (QID) | ORAL | Status: DC | PRN
  Administered 2019-07-17 (×2): 650 mg via ORAL
  Filled 2019-07-05 (×2): qty 2

## 2019-07-05 MED ORDER — SODIUM CHLORIDE 0.9% FLUSH
3.0000 mL | Freq: Once | INTRAVENOUS | Status: AC
  Administered 2019-07-05: 3 mL via INTRAVENOUS

## 2019-07-05 MED ORDER — ACETAMINOPHEN 650 MG RE SUPP: 650.0000 mg | Freq: Four times a day (QID) | RECTAL | Status: DC | PRN

## 2019-07-05 MED ORDER — ENSURE ENLIVE PO LIQD
237.0000 mL | Freq: Two times a day (BID) | ORAL | Status: DC
  Administered 2019-07-05 – 2019-07-09 (×2): 237 mL via ORAL

## 2019-07-05 MED ORDER — SODIUM CHLORIDE 0.9 % IV SOLN: INTRAVENOUS | Status: DC

## 2019-07-05 MED ORDER — LATANOPROST 0.005 % OP SOLN
1.0000 [drp] | Freq: Every day | OPHTHALMIC | Status: DC
  Administered 2019-07-05 – 2019-07-17 (×13): 1 [drp] via OPHTHALMIC
  Filled 2019-07-05: qty 2.5

## 2019-07-05 MED ORDER — SODIUM CHLORIDE 0.9 % IV SOLN
200.0000 mg | Freq: Once | INTRAVENOUS | Status: AC
  Administered 2019-07-05: 200 mg via INTRAVENOUS
  Filled 2019-07-05: qty 200

## 2019-07-05 MED ORDER — PRAVASTATIN SODIUM 40 MG PO TABS
40.0000 mg | ORAL_TABLET | Freq: Every day | ORAL | Status: DC
  Administered 2019-07-05 – 2019-07-17 (×11): 40 mg via ORAL
  Filled 2019-07-05 (×10): qty 1

## 2019-07-05 MED ORDER — SODIUM CHLORIDE 0.9 % IV SOLN
100.0000 mg | Freq: Every day | INTRAVENOUS | Status: AC
  Administered 2019-07-06 – 2019-07-09 (×4): 100 mg via INTRAVENOUS
  Filled 2019-07-05 (×4): qty 20

## 2019-07-05 MED ORDER — DIGOXIN 125 MCG PO TABS: 0.1250 mg | ORAL_TABLET | Freq: Every day | ORAL | Status: DC

## 2019-07-05 MED ORDER — WARFARIN SODIUM 1 MG PO TABS
1.0000 mg | ORAL_TABLET | Freq: Once | ORAL | Status: AC
  Administered 2019-07-05: 1 mg via ORAL
  Filled 2019-07-05: qty 1

## 2019-07-05 NOTE — ED Notes (Signed)
Pt is aware she needs a urine sample but she doesn't need to go at this moment

## 2019-07-05 NOTE — ED Notes (Signed)
MD was notified of current COVID readout

## 2019-07-05 NOTE — Progress Notes (Signed)
ANTICOAGULATION CONSULT NOTE - Initial Consult  Pharmacy Consult for Warfarin  Indication: atrial fibrillation  No Known Allergies   Vital Signs: Temp: 98.3 F (36.8 C) (01/14 0016) Temp Source: Oral (01/14 0016) BP: 146/90 (01/14 0312) Pulse Rate: 98 (01/14 0312)  Labs: Recent Labs    07/05/19 0017  HGB 15.6*  HCT 48.4*  PLT 233  CREATININE 3.42*    CrCl cannot be calculated (Unknown ideal weight.).   Medical History: Past Medical History:  Diagnosis Date  . Atrial fibrillation ([place])    a. 03/2013 s/p TEE/DCCV;  b. 03/2013 Eliquis initiated.  . Chronic systolic CHF (congestive heart failure) ([place])    a. 03/2013 Echo: EF 20-25%, diff HK, mild to mod MR, sev dil LA, mild RV dysfxn, sev dil RA, mod TR.  . Claudication ([place])   . Glaucoma   . High cholesterol   . Hypertension   . Type II diabetes mellitus ([facility])    Assessment: 86 y/o F in the ED with failure to thrive, on warfarin PTA for afib, no INR drawn  Goal of Therapy:  INR 2-3 Monitor platelets by anticoagulation protocol: Yes   Plan:  -INR with AM labs to assess dosing needs  Narda Bonds 07/05/2019,3:59 AM

## 2019-07-05 NOTE — ED Provider Notes (Signed)
[place] EMERGENCY DEPARTMENT Provider Note   CSN: [id number] Arrival date & time: 07/04/19  2357     History Chief Complaint  Patient presents with  . Failure To Thrive    Patricia Randall is a 86 y.o. female.  The history is provided by the patient and medical records.    86 y.o. F with history of CHF, cardiomyopathy, CKD, AFIB on coumadin, DM2, presenting to the ED for feeling "bad" over the past 2 days.  States all she wants to do is lay around and sleep.  She has no energy and does not want to eat/drink. States she feels dry.  No vomiting or diarrhea.  She has continued urinating in small amounts.  She denies any chest pain or SOB.  No fevers/chills.  No sick contacts or known COVID exposures.  States she mostly has been trying to stay home but her family has been coming to visit.  Past Medical History:  Diagnosis Date  . Atrial fibrillation ([place])    a. 03/2013 s/p TEE/DCCV;  b. 03/2013 Eliquis initiated.  . Chronic systolic CHF (congestive heart failure) ([place])    a. 03/2013 Echo: EF 20-25%, diff HK, mild to mod MR, sev dil LA, mild RV dysfxn, sev dil RA, mod TR.  . Claudication ([place])   . Glaucoma   . High cholesterol   . Hypertension   . Type II diabetes mellitus [facility])     Patient Active Problem List   Diagnosis Date Noted  . Limb ischemia   . CHF exacerbation ([place]) 11/20/2017  . Type II diabetes mellitus ([place]) 11/13/2017  . Ulcer of right lower extremity ([place]) 11/13/2017  . Acute on chronic systolic CHF (congestive heart failure) ([place]) 09/26/2017  . Protein-calorie malnutrition, severe 07/05/2015  . CKD (chronic kidney disease), stage III ([place])   . Essential hypertension, benign 03/13/2014  . Cardiomyopathy ([place]) 04/01/2013  . Permanent atrial fibrillation ([place]) 03/31/2013    Past Surgical History:  Procedure Laterality Date  . CARDIOVERSION N/A 04/02/2013   Procedure: CARDIOVERSION;  Surgeon: Thayer Headings, MD;  Location: [place];   Service: Cardiovascular;  Laterality: N/A;  Spoke with Tom   . ESOPHAGOGASTRODUODENOSCOPY N/A 05/26/2016   Procedure: ESOPHAGOGASTRODUODENOSCOPY (EGD);  Surgeon: Ladene Artist, MD;  Location[facility] ENDOSCOPY;  Service: Endoscopy;  Laterality: N/A;  . EYE SURGERY Right    "had blood in it"   . TEE WITHOUT CARDIOVERSION N/A 04/02/2013   Procedure: TRANSESOPHAGEAL ECHOCARDIOGRAM (TEE);  Surgeon: Thayer Headings, MD;  Location: [facility] ENDOSCOPY;  Service: Cardiovascular;  Laterality: N/A;     OB History   No obstetric history on file.     Family History  Problem Relation Age of Onset  . Other Mother        died in her 80's - 'just got sick.'  . Other Father        died @ 34, unknown cause.  . Diabetes Brother   . Other Other        12 siblings + 7 more 1/2 siblings.    Social History   Tobacco Use  . Smoking status: Never Smoker  . Smokeless tobacco: Never Used  Substance Use Topics  . Alcohol use: No  . Drug use: No    Home Medications Prior to Admission medications   Medication Sig Start Date End Date Taking? Authorizing Provider  ACCU-CHEK AVIVA PLUS test strip TO CHECK BLOOD GLUCOSE ONCE DAILY **DX CODE: E11.9** 01/30/18   [provider]  ACCU-CHEK SOFTCLIX LANCETS lancets USE TO CHECK BLOOD GLUCOSE ONCE DAILY **DX CODE: E11.9** 01/30/18   [provider]  Blood Glucose Monitoring Suppl (ACCU-CHEK AVIVA PLUS) w/Device KIT TO CHECK BLOOD GLUCOSE ONCE DAILY **DX CODE: E11.9** 01/30/18   [provider]  cholecalciferol (VITAMIN D) 1000 units tablet Take 1,000 Units by mouth daily.    [provider]  colchicine 0.6 MG tablet Take 0.6 mg by mouth as needed. 09/28/18   [provider]  digoxin (LANOXIN) 0.125 MG tablet Take 0.125 mg by mouth daily. Take 1/2 tab daily    [provider]  feeding supplement, GLUCERNA SHAKE, (GLUCERNA SHAKE) LIQD Take 237 mLs by mouth daily.    [provider]  furosemide (LASIX) 40 MG tablet  Take 40 mg by mouth daily.    [provider]  glimepiride (AMARYL) 4 MG tablet Take 1 tablet (4 mg total) by mouth daily before breakfast. 11/15/17   Emokpae, Courage, MD  latanoprost (XALATAN) 0.005 % ophthalmic solution Place 1 drop into both eyes at bedtime.    [provider]  lovastatin (MEVACOR) 40 MG tablet Take 1 tablet (40 mg total) by mouth at bedtime. 11/15/17   Roxan Hockey, MD  metoprolol succinate (TOPROL-XL) 100 MG 24 hr tablet Take 1 tablet (100 mg total) by mouth daily. Take with or immediately following a meal. 11/25/17   Rai, Ripudeep K, MD  polyethylene glycol (MIRALAX / GLYCOLAX) packet Take 17 g by mouth daily as needed for mild constipation. 09/29/17   Lavina Hamman, MD  warfarin (COUMADIN) 2 MG tablet Take 2 mg by mouth as directed.    [provider]    Allergies    Patient has no known allergies.  Review of Systems   Review of Systems  Constitutional: Positive for appetite change and fatigue.  All other systems reviewed and are negative.   Physical Exam Updated Vital Signs BP (!) 131/99 (BP Location: Right Arm)   Pulse 67   Temp 98.3 F (36.8 C) (Oral)   Resp 18   SpO2 98%   Physical Exam Vitals and nursing note reviewed.  Constitutional:      Appearance: She is well-developed.     Comments: Elderly, no acute distress  HENT:     Head: Normocephalic and atraumatic.     Mouth/Throat:     Comments: Mucous membranes are dry Eyes:     Conjunctiva/sclera: Conjunctivae normal.     Pupils: Pupils are equal, round, and reactive to light.  Cardiovascular:     Rate and Rhythm: Normal rate and regular rhythm.     Heart sounds: Normal heart sounds.  Pulmonary:     Effort: Pulmonary effort is normal. No respiratory distress.     Breath sounds: Normal breath sounds. No rhonchi.  Abdominal:     General: Bowel sounds are normal.     Palpations: Abdomen is soft.     Tenderness: There is no guarding.     Hernia: No hernia is  present.  Musculoskeletal:        General: Normal range of motion.     Cervical back: Normal range of motion.  Skin:    General: Skin is warm and dry.  Neurological:     Mental Status: She is alert and oriented to person, place, and time.     ED Results / Procedures / Treatments   Labs (all labs ordered are listed, but only abnormal results are displayed) Labs Reviewed  COMPREHENSIVE METABOLIC PANEL -  Abnormal; Notable for the following components:      Result Value   Glucose, Bld 148 (*)    BUN 57 (*)    Creatinine, Ser 3.42 (*)    Calcium 8.2 (*)    Albumin 2.3 (*)    GFR calc non Af Amer 11 (*)    GFR calc Af Amer 13 (*)    All other components within normal limits  CBC - Abnormal; Notable for the following components:   RBC 5.47 (*)    Hemoglobin 15.6 (*)    HCT 48.4 (*)    All other components within normal limits  POC SARS CORONAVIRUS 2 AG -  ED - Abnormal; Notable for the following components:   SARS Coronavirus 2 Ag POSITIVE (*)    All other components within normal limits  URINE CULTURE  LIPASE, BLOOD  URINALYSIS, ROUTINE W REFLEX MICROSCOPIC  DIGOXIN LEVEL  PROTIME-INR    EKG EKG Interpretation  Date/Time:  Thursday July 05 2019 00:02:47 EST Ventricular Rate:  129 PR Interval:    QRS Duration: 84 QT Interval:  350 QTC Calculation: 512 R Axis:   -86 Text Interpretation: Atrial fibrillation with rapid ventricular response Left axis deviation Anteroseptal infarct , age undetermined ST & T wave abnormality, consider inferolateral ischemia Abnormal ECG No significant change since last tracing Confirmed by Pryor Curia [phone]) on 07/05/2019 1:24:29 AM   Radiology DG Chest Portable 1 View  Result Date: 07/05/2019 CLINICAL DATA:  COVID positive and fatigue EXAM: PORTABLE CHEST 1 VIEW COMPARISON:  November 20, 2017 FINDINGS: The heart size and mediastinal contours are unchanged with mild cardiomegaly. Aortic knob calcifications. Both lungs are clear. The  visualized skeletal structures are unremarkable. IMPRESSION: Mild cardiomegaly.  No acute cardiopulmonary process. Electronically Signed   By: Prudencio Pair M.D.   On: 07/05/2019 02:11    Procedures Procedures (including critical care time)  Medications Ordered in ED Medications  sodium chloride flush (NS) 0.9 % injection 3 mL (has no administration in time range)  0.9 %  sodium chloride infusion (has no administration in time range)  digoxin (LANOXIN) tablet 0.125 mg (has no administration in time range)  pravastatin (PRAVACHOL) tablet 40 mg (has no administration in time range)  metoprolol succinate (TOPROL-XL) 24 hr tablet 100 mg (has no administration in time range)  latanoprost (XALATAN) 0.005 % ophthalmic solution 1 drop (has no administration in time range)  acetaminophen (TYLENOL) tablet 650 mg (has no administration in time range)    Or  acetaminophen (TYLENOL) suppository 650 mg (has no administration in time range)  ondansetron (ZOFRAN) tablet 4 mg (has no administration in time range)    Or  ondansetron (ZOFRAN) injection 4 mg (has no administration in time range)  insulin aspart (novoLOG) injection 0-9 Units (has no administration in time range)  ondansetron (ZOFRAN-ODT) disintegrating tablet 4 mg (4 mg Oral Given 07/05/19 0039)    ED Course  I have reviewed the triage vital signs and the nursing notes.  Pertinent labs & imaging results that were available during my care of the patient were reviewed by me and considered in my medical decision making (see chart for details).    MDM Rules/Calculators/A&P    86 year old female presenting to the ED with fatigue, poor appetite, and generally feeling unwell for the past 2 days.  States all she has really been wanting to do is sit around and sleep.  She is afebrile and nontoxic in appearance here.  She does appear tired and somewhat dehydrated  with dry mucous membranes.  Her lungs are clear without any wheezes or rhonchi.   Screening labs from triage revealing that patient is Covid positive.  She denies any known exposure as she has been trying to isolate at home as much as possible.  Her family does come over and visit, but none of them have been sick or known to have Covid to her knowledge.  She does have evidence of acute renal failure with creatinine of 3.42, baseline is around 2.  Chest x-ray without any acute findings.  Patient is on Coumadin for chronic A. fib as well as digoxin so we will add on PT/INR and dig level.  She was started on IV fluids.  2:29 AM Called and spoke with granddaughter York Cerise-- she is aware of + COVID results, labs findings, and need for admission.  She and remainder of family that has been with patient will get tested.  She would like continued updates as available at [phone].  Discussed with Dr. Hal Hope-- he will admit for ongoing care.  Final Clinical Impression(s) / ED Diagnoses Final diagnoses:  COVID-19  Dehydration  AKI (acute kidney injury) [facility])    Rx / DC Orders ED Discharge Orders    None       Larene Pickett, PA-C 07/05/19 0322    Ward, Delice Bison, DO 07/05/19 [phone]

## 2019-07-05 NOTE — Progress Notes (Signed)
Initial Nutrition Assessment  RD working remotely.  DOCUMENTATION CODES:   Not applicable  INTERVENTION:   - Liberalize diet to Regular, verbal with readback order placed per MD  - Ensure Enlive po BID, each supplement provides 350 kcal and 20 grams of protein  - Encourage adequate PO intake  - d/c Glucerna  NUTRITION DIAGNOSIS:   Increased nutrient needs related to acute illness, catabolic illness ([id number]) as evidenced by estimated needs.  GOAL:   Patient will meet greater than or equal to 90% of their needs  MONITOR:   PO intake, Supplement acceptance, Labs, Weight trends  REASON FOR ASSESSMENT:   Malnutrition Screening Tool    ASSESSMENT:   86 year old female who presented to the ED on 1/13 with loss of appetite and weakness. PMH of CHF, atrial fibrillation, CKD stage IV, T2DM. Pt tested positive for COVID-19.  Pt currently on a Heart Healthy/Carb Modified diet. Discussed diet liberalization with MD who agreed. Regular diet ordered.  Reviewed weight history in chart. Pt with a 3.6 kg weight loss since 10/05/18. This is a 5.2% weight loss which is not significant for timeframe.  Per RN edema assessment, pt with non-pitting edema to BLE. This may be masking additional weight loss.  Spoke with pt via phone call to room. Pt reports that she is doing "much better" and was able to eat some at breakfast today. Pt states she ate the oatmeal and applesauce but did not eat the pancakes or sausage. Pt reports this is a huge improvement compared to PTA. Pt reports that for 3 days PTA, she was barely eating at all.  Pt denies any N/V at this time.  Pt reports that she drank part of a Glucerna this morning. Pt states she drinks the [employer] brand at home. Pt willing to drink Ensure Enlive to provide more kcal and protein. RD to adjust orders.  Medications reviewed and include: Glucerna BID, SSI, remdesivir IVF: NS @ 50 ml/hr  Labs reviewed. CBG's: 179  NUTRITION - FOCUSED  PHYSICAL EXAM:  Unable to complete at this time. RD working remotely.  Diet Order:   Diet Order            Diet regular Room service appropriate? Yes; Fluid consistency: Thin  Diet effective now              EDUCATION NEEDS:   Education needs have been addressed  Skin:  Skin Assessment: Reviewed RN Assessment  Last BM:  07/02/19  Height:   Ht Readings from Last 1 Encounters:  07/05/19 5\' 2"  (1.575 m)    Weight:   Wt Readings from Last 1 Encounters:  07/05/19 66.3 kg    Ideal Body Weight:  50 kg  BMI:  Body mass index is 26.73 kg/m.  Estimated Nutritional Needs:   Kcal:  1500-1700  Protein:  70-85 grams  Fluid:  >/= 1.5 L    Gaynell Face, MS, RD, LDN Inpatient Clinical Dietitian Pager: [phone] Weekend/After Hours: [phone]

## 2019-07-05 NOTE — ED Notes (Signed)
[phone] Patricia Randall, second # [id number]

## 2019-07-05 NOTE — Progress Notes (Signed)
ANTICOAGULATION CONSULT NOTE - Initial Consult  Pharmacy Consult for warfarin Indication: atrial fibrillation (CHADS2VASc = 7)  No Known Allergies  Patient Measurements: Height: 5\' 2"  (157.5 cm) Weight: 146 lb 2.6 oz (66.3 kg) IBW/kg (Calculated) : 50.1   Vital Signs: Temp: 98.2 F (36.8 C) (01/14 0450) Temp Source: Oral (01/14 0450) BP: 155/80 (01/14 0450) Pulse Rate: 98 (01/14 0312)  Labs: Recent Labs    07/05/19 0017 07/05/19 0614  HGB 15.6* 15.6*  HCT 48.4* 48.5*  PLT 233 233  LABPROT  --  32.0*  INR  --  3.1*  CREATININE 3.42* 3.58*  TROPONINIHS  --  35*    Estimated Creatinine Clearance: 9.7 mL/min (A) (by C-G formula based on SCr of 3.58 mg/dL (H)).   Assessment: 86 yo W on warfarin for afib (CHADS2VASc = 7). Following by Dr. Claris Gower [facility], [phone], who's staff said patient was last seen 06/11/19 and instructed to take 6mg -3mg -3mg -3mg  with follow up on 06/25/19, which the patient missed. Per patient, she's taking warfarin 6mg -6mg -3mg -3mg -3mg -6mg . Unclear from both sources which days of the week are 3mg  and which are 6mg . Confirmed she took warfarin on 07/03/19 before admission. D-dimer < 5.   INR 3.1 on admit. Poor PO at least 3 days before admission. Will give small dose to prevent drop.    PTA Warfarin: 3-6mg , unclear exact dose but likely 3mg  for 3-4 days and 6mg  all other days.  Goal of Therapy:  INR 2-3 Monitor platelets by anticoagulation protocol: Yes   Plan:  Warfarin 1mg  x1  Daily INR, CBC, monitor for bleeding   Benetta Spar, PharmD, BCPS, BCCP Clinical Pharmacist  Please check AMION for all [place] phone numbers After 10:00 PM, call [place] [phone]

## 2019-07-05 NOTE — ED Notes (Signed)
[phone] Pts son called checking on mother, please update

## 2019-07-05 NOTE — ED Notes (Signed)
Pt requesting zofran.

## 2019-07-05 NOTE — Progress Notes (Addendum)
PROGRESS NOTE   Patricia Randall  [id number]    DOB: [birth year]    DOA: 07/04/2019  PCP: Leonard Downing, MD   I have briefly reviewed patients previous medical [facility] in [facility].  Chief Complaint:   Chief Complaint  Patient presents with  . Failure To Thrive    Brief Narrative:  86 year old female patient with PMH of atrial fibrillation on Warfarin, chronic systolic CHF, LVEF [id number] by TTE 09/27/2017, hyperlipidemia, hypertension, type II DM, stage IV CKD (baseline creatinine ~1.8), presented to the ER due to increasing fatigue, weakness, poor appetite, decreased oral intake, decreased mobility and laying in the bed all the time for the last 3 days.  Transiently in A. fib with RVR in the ED, improved after IV fluids.  Admitted for acute on chronic kidney disease due to COVID-19 infection with associated poor oral intake and dehydration.   Assessment & Plan:  Principal Problem:   COVID-19 virus infection Active Problems:   Permanent atrial fibrillation (HCC)   Cardiomyopathy ([place])   DM (diabetes mellitus), type 2 with renal complications (HCC)   AKI (acute kidney injury) ([place])   Acute kidney injury complicating CKD stage IV  Most likely due to COVID-19 infection related to poor oral intake and continued Lasix use.  Held Lasix temporarily.  Briefly hydrated with IV fluids.  Encouraged oral intake.  Baseline creatinine ~1.8, presented with creatinine of 3.42.  Trend daily BMP.  RD evaluated and plans to liberalize diet to regular with nutritional supplements to encourage oral intake.  Despite overnight hydration, creatinine has worsened from 3.42-3.58.  Will briefly increase IV fluids.  COVID-19 infection  Continue IV remdesivir.  Decadron not started since patient was not hypoxic.  Chest x-ray showed no acute abnormalities.  A. fib with RVR  Transient RVR in the ED which improved after IV fluid hydration.  Continue Toprol-XL 100 mg daily.  Has communicated with  pharmacy, reduced digoxin dose from 0.0625 mg daily to every other day pending improvement in renal insufficiency.  Digoxin level normal.  Continue Coumadin per pharmacy/INR therapeutic 3.1.  Chronic systolic CHF/cardiomyopathy  TTE 09/27/2017: LVEF 30-25%.  Clinically dehydrated on admission and now.  Hold Lasix.  Continue gentle IV fluids and resume Lasix when able, possibly at discharge.  Essential hypertension  Mildly uncontrolled at times.  Continue Toprol-XL 100 mg daily.  Hyperlipidemia  Continue statins.  Type II DM with renal complications  Hyperglycemia now worsened by steroids.  Managed with SSI and adjust insulins as needed.  Body mass index is 26.73 kg/m.  Nutritional Status Nutrition Problem: Increased nutrient needs Etiology: acute illness, catabolic [id number]) Signs/Symptoms: estimated needs Interventions: Ensure Enlive (each supplement provides 350kcal and 20 grams of protein), Liberalize Diet  DVT prophylaxis: Anticoagulated on warfarin. Code Status: Full Family Communication: None at bedside. I discussed with patient's son via phone, updated care and answered questions. Disposition: Patient admitted from home.  Plan will be to discharge her home pending clinical improvement and therapies evaluation.   Consultants:   None  Procedures:   None  Antimicrobials:   None   Subjective:  Patient reports feeling better.  Feels that her appetite is improving.  Seen that she had eaten some of her breakfast this morning.  Denies pain or dyspnea.  As per RN, no acute issues reported.  Objective:   Vitals:   07/05/19 0016 07/05/19 0312 07/05/19 0450 07/05/19 0954  BP: (!) 131/99 (!) 146/90 (!) 155/80 (!) 147/74  Pulse: 67 98  91  Resp: 18 (!) 21 (!) 24   Temp: 98.3 F (36.8 C)  98.2 F (36.8 C) (!) 97.4 F (36.3 C)  TempSrc: Oral  Oral Oral  SpO2: 98% 96% 97%   Weight:   66.3 kg   Height:   5\' 2"  (1.575 m)     General exam: Pleasant elderly  female, moderately built and frail sitting up comfortably in bed, still working on her breakfast.  Oral mucosa dry.  Decreased skin turgor. Respiratory system: Clear to auscultation. Respiratory effort normal. Cardiovascular system: S1 & S2 heard, irregularly irregular. No JVD, murmurs, rubs, gallops or clicks. No pedal edema.  Telemetry personally reviewed: A. fib with controlled ventricular rate. Gastrointestinal system: Abdomen is nondistended, soft and nontender. No organomegaly or masses felt. Normal bowel sounds heard. Central nervous system: Alert and oriented to self and place. No focal neurological deficits. Extremities: Symmetric 5 x 5 power. Skin: No rashes, lesions or ulcers Psychiatry: Judgement and insight appear somewhat impaired. Mood & affect pleasant and appropriate.     Data Reviewed:   I have personally reviewed following labs and imaging studies   CBC: Recent Labs  Lab 07/05/19 0017 07/05/19 0614  WBC 4.7 4.4  NEUTROABS  --  3.4  HGB 15.6* 15.6*  HCT 48.4* 48.5*  MCV 88.5 86.8  PLT 233 0000000    Basic Metabolic Panel: Recent Labs  Lab 07/05/19 0017 07/05/19 0614  NA 138 138  K 4.5 4.8  CL 101 98  CO2 22 22  GLUCOSE 148* 183*  BUN 57* 63*  CREATININE 3.42* 3.58*  CALCIUM 8.2* 8.1*  MG  --  1.8    Liver Function Tests: Recent Labs  Lab 07/05/19 0017 07/05/19 0614  AST 27 24  ALT 15 15  ALKPHOS 65 61  BILITOT 0.9 1.2  PROT 6.7 6.0*  ALBUMIN 2.3* 2.1*    CBG: Recent Labs  Lab 07/05/19 0805 07/05/19 1145  GLUCAP 179* 213*    Microbiology Studies:  No results found for this or any previous visit (from the past 240 hour(s)).   Radiology Studies:  DG Chest Portable 1 View  Result Date: 07/05/2019 CLINICAL DATA:  COVID positive and fatigue EXAM: PORTABLE CHEST 1 VIEW COMPARISON:  November 20, 2017 FINDINGS: The heart size and mediastinal contours are unchanged with mild cardiomegaly. Aortic knob calcifications. Both lungs are clear. The  visualized skeletal structures are unremarkable. IMPRESSION: Mild cardiomegaly.  No acute cardiopulmonary process. Electronically Signed   By: Prudencio Pair M.D.   On: 07/05/2019 02:11     Scheduled Meds:   . digoxin  0.0625 mg Oral QODAY  . feeding supplement (ENSURE ENLIVE)  237 mL Oral BID BM  . insulin aspart  0-9 Units Subcutaneous TID WC  . latanoprost  1 drop Both Eyes QHS  . metoprolol succinate  100 mg Oral Daily  . pravastatin  40 mg Oral q1800    Continuous Infusions:   . sodium chloride 50 mL/hr at 07/05/19 0721  . [START ON 07/06/2019] remdesivir 100 mg in NS 100 mL       LOS: 0 days     Vernell Leep, MD, [place], [facility]. [facility]    To contact the attending provider between 7A-7P or the covering provider during after hours 7P-7A, please log into the web site [email or web address] and access using universal [place] password for that web site. If you do not have the password, please call the hospital operator.  07/05/2019, 12:40 PM

## 2019-07-05 NOTE — ED Notes (Deleted)
[phone] Pts son called checking on mother, please update

## 2019-07-05 NOTE — H&P (Signed)
History and Physical    Margan Elias ZHG:[id number] DOB: 07-27-1928 DOA: 07/04/2019  PCP: Leonard Downing, MD  Patient coming from: Home.  Chief Complaint: Weakness.  HPI: Patricia Randall is a 86 y.o. female with history of chronic systolic heart failure last EF measured was 30 to 35% with history of A. fib hypertension chronic kidney disease stage IV baseline creatinine around 1.8 and diabetes mellitus presents to the ER after patient has been feeling intensely fatigued weak with poor appetite unable to eat anything and lying on the bed all the time for the last 3 days.  Patient denies any chest pain shortness of breath productive cough fever chills nausea vomiting or diarrhea.  ED Course: In the ER patient appears generally weak and was not hypoxic.  EKG initially showed A. fib with RVR but improved with fluids.  Labs reveal Covid test came back positive.  Creatinine was 3.4 which increased from 1.8 in August 2019.  CBC largely unremarkable.  Inflammatory markers are pending.  Given the generalized fatigue weakness and renal failure with Covid infection patient admitted for further observation and management of renal failure and Covid infection.  Chest x-ray showed mild cardiomegaly.  Review of Systems: As per HPI, rest all negative.   Past Medical History:  Diagnosis Date  . Atrial fibrillation ([place])    a. 03/2013 s/p TEE/DCCV;  b. 03/2013 Eliquis initiated.  . Chronic systolic CHF (congestive heart failure) ([place])    a. 03/2013 Echo: EF 20-25%, diff HK, mild to mod MR, sev dil LA, mild RV dysfxn, sev dil RA, mod TR.  . Claudication ([place])   . Glaucoma   . High cholesterol   . Hypertension   . Type II diabetes mellitus ([place])     Past Surgical History:  Procedure Laterality Date  . CARDIOVERSION N/A 04/02/2013   Procedure: CARDIOVERSION;  Surgeon: Thayer Headings, MD;  Location: [place];  Service: Cardiovascular;  Laterality: N/A;  Spoke with Tom   . ESOPHAGOGASTRODUODENOSCOPY  N/A 05/26/2016   Procedure: ESOPHAGOGASTRODUODENOSCOPY (EGD);  Surgeon: Ladene Artist, MD;  Location: [facility] ENDOSCOPY;  Service: Endoscopy;  Laterality: N/A;  . EYE SURGERY Right    "had blood in it"   . TEE WITHOUT CARDIOVERSION N/A 04/02/2013   Procedure: TRANSESOPHAGEAL ECHOCARDIOGRAM (TEE);  Surgeon: Thayer Headings, MD;  Location: [place];  Service: Cardiovascular;  Laterality: N/A;     reports that she has never smoked. She has never used smokeless tobacco. She reports that she does not drink alcohol or use drugs.  No Known Allergies  Family History  Problem Relation Age of Onset  . Other Mother        died in her 46's - 'just got sick.'  . Other Father        died @ 38, unknown cause.  . Diabetes Brother   . Other Other        12 siblings + 7 more 1/2 siblings.    Prior to Admission medications   Medication Sig Start Date End Date Taking? Authorizing Provider  ACCU-CHEK AVIVA PLUS test strip TO CHECK BLOOD GLUCOSE ONCE DAILY **DX CODE: E11.9** 01/30/18   [provider]  ACCU-CHEK SOFTCLIX LANCETS lancets USE TO CHECK BLOOD GLUCOSE ONCE DAILY **DX CODE: E11.9** 01/30/18   [provider]  Blood Glucose Monitoring Suppl (ACCU-CHEK AVIVA PLUS) w/Device KIT TO CHECK BLOOD GLUCOSE ONCE DAILY **DX CODE: E11.9** 01/30/18   [provider]  cholecalciferol (VITAMIN D) 1000 units tablet Take 1,000  Units by mouth daily.    [provider]  colchicine 0.6 MG tablet Take 0.6 mg by mouth as needed. 09/28/18   [provider]  digoxin (LANOXIN) 0.125 MG tablet Take 0.125 mg by mouth daily. Take 1/2 tab daily    [provider]  feeding supplement, GLUCERNA SHAKE, (GLUCERNA SHAKE) LIQD Take 237 mLs by mouth daily.    [provider]  furosemide (LASIX) 40 MG tablet Take 40 mg by mouth daily.    [provider]  glimepiride (AMARYL) 4 MG tablet Take 1 tablet (4 mg total) by mouth daily before breakfast. 11/15/17   Emokpae,  Courage, MD  latanoprost (XALATAN) 0.005 % ophthalmic solution Place 1 drop into both eyes at bedtime.    [provider]  lovastatin (MEVACOR) 40 MG tablet Take 1 tablet (40 mg total) by mouth at bedtime. 11/15/17   Roxan Hockey, MD  metoprolol succinate (TOPROL-XL) 100 MG 24 hr tablet Take 1 tablet (100 mg total) by mouth daily. Take with or immediately following a meal. 11/25/17   Rai, Ripudeep K, MD  polyethylene glycol (MIRALAX / GLYCOLAX) packet Take 17 g by mouth daily as needed for mild constipation. 09/29/17   Lavina Hamman, MD  warfarin (COUMADIN) 2 MG tablet Take 2 mg by mouth as directed.    [provider]    Physical Exam: Constitutional: Moderately built and nourished. Vitals:   07/04/19 2359 07/05/19 0016 07/05/19 0312  BP:  (!) 131/99 (!) 146/90  Pulse:  67 98  Resp:  18 (!) 21  Temp:  98.3 F (36.8 C)   TempSrc:  Oral   SpO2: 97% 98% 96%   Eyes: Anicteric no pallor. ENMT: No discharge from the ears eyes nose or mouth. Neck: No mass felt.  No neck rigidity.  No JVD appreciated. Respiratory: No rhonchi or crepitations. Cardiovascular: S1-S2 heard. Abdomen: Soft nontender bowel sounds present. Musculoskeletal: No edema. Skin: No rash. Neurologic: Alert awake oriented to time place and person.  Moves all extremities. Psychiatric: Appears normal per normal affect.   Labs on Admission: I have personally reviewed following labs and imaging studies  CBC: Recent Labs  Lab 07/05/19 0017  WBC 4.7  HGB 15.6*  HCT 48.4*  MCV 88.5  PLT 754   Basic Metabolic Panel: Recent Labs  Lab 07/05/19 0017  NA 138  K 4.5  CL 101  CO2 22  GLUCOSE 148*  BUN 57*  CREATININE 3.42*  CALCIUM 8.2*   GFR: CrCl cannot be calculated (Unknown ideal weight.). Liver Function Tests: Recent Labs  Lab 07/05/19 0017  AST 27  ALT 15  ALKPHOS 65  BILITOT 0.9  PROT 6.7  ALBUMIN 2.3*   Recent Labs  Lab 07/05/19 0017  LIPASE 25   No results for  input(s): AMMONIA in the last 168 hours. Coagulation Profile: No results for input(s): INR, PROTIME in the last 168 hours. Cardiac Enzymes: No results for input(s): CKTOTAL, CKMB, CKMBINDEX, TROPONINI in the last 168 hours. BNP (last 3 results) No results for input(s): PROBNP in the last 8760 hours. HbA1C: No results for input(s): HGBA1C in the last 72 hours. CBG: No results for input(s): GLUCAP in the last 168 hours. Lipid Profile: No results for input(s): CHOL, HDL, LDLCALC, TRIG, CHOLHDL, LDLDIRECT in the last 72 hours. Thyroid Function Tests: No results for input(s): TSH, T4TOTAL, FREET4, T3FREE, THYROIDAB in the last 72 hours. Anemia Panel: No results for input(s): VITAMINB12, FOLATE, FERRITIN, TIBC, IRON, RETICCTPCT in the last 72  hours. Urine analysis:    Component Value Date/Time   COLORURINE YELLOW 09/02/2017 1354   APPEARANCEUR CLEAR 09/02/2017 1354   LABSPEC 1.010 09/02/2017 1354   PHURINE 5.0 09/02/2017 1354   GLUCOSEU NEGATIVE 09/02/2017 1354   HGBUR NEGATIVE 09/02/2017 1354   BILIRUBINUR NEGATIVE 09/02/2017 1354   KETONESUR NEGATIVE 09/02/2017 1354   PROTEINUR NEGATIVE 09/02/2017 1354   NITRITE NEGATIVE 09/02/2017 1354   LEUKOCYTESUR NEGATIVE 09/02/2017 1354   Sepsis Labs: '@LABRCNTIP' (procalcitonin:4,lacticidven:4) )No results found for this or any previous visit (from the past 240 hour(s)).   Radiological Exams on Admission: DG Chest Portable 1 View  Result Date: 07/05/2019 CLINICAL DATA:  COVID positive and fatigue EXAM: PORTABLE CHEST 1 VIEW COMPARISON:  November 20, 2017 FINDINGS: The heart size and mediastinal contours are unchanged with mild cardiomegaly. Aortic knob calcifications. Both lungs are clear. The visualized skeletal structures are unremarkable. IMPRESSION: Mild cardiomegaly.  No acute cardiopulmonary process. Electronically Signed   By: Prudencio Pair M.D.   On: 07/05/2019 02:11    EKG: Independently reviewed.  A. fib with  RVR.  Assessment/Plan Principal Problem:   COVID-19 virus infection Active Problems:   Permanent atrial fibrillation (HCC)   Cardiomyopathy ([place])   DM (diabetes mellitus), type 2 with renal complications ([place])   AKI (acute kidney injury) ([place])    1. Acute renal failure with generalized weakness fatigue secondary to poor oral intake secondary to Covid infection on my exam patient abdomen appears benign.  Gently hydrating for now note that patient does have a history of chronic systolic heart failure.  Will hold off Lasix for now.  Follow intake output and since patient does have Covid infection I started patient on remdesivir but holding off Decadron other medication since patient is on hypoxic and chest x-ray does not show any definite infiltrates and patient is afebrile.  Inflammatory markers are pending. 2. Acute renal failure on chronic kidney disease stage IV see #1. 3. A. fib with RVR which improved with fluids.  Presently on metoprolol Coumadin and digoxin.  Digoxin levels are pending. 4. History of chronic systolic heart failure last EF measured was 30 to 35% in April 2019 presently receiving fluids for dehydration.  Holding of Lasix. 5. Diabetes mellitus type 2 we will keep patient on sliding scale coverage. 6. Hypertension on metoprolol.  Given that patient has acute renal failure with Covid symptoms will need inpatient status for close monitoring of further deterioration.   DVT prophylaxis: Coumadin. Code Status: Full code.  Confirmed with patient. Family Communication: Discussed with patient. Disposition Plan: Home when stable. Consults called: None. Admission status: Inpatient.   Rise Patience MD [facility] Pager [phone].  If 7PM-7AM, please contact night-coverage [email or web address] Password [facility]  07/05/2019, 3:19 AM

## 2019-07-06 ENCOUNTER — Inpatient Hospital Stay (HOSPITAL_COMMUNITY): Payer: Medicare Other

## 2019-07-06 DIAGNOSIS — E860 Dehydration: Secondary | ICD-10-CM

## 2019-07-06 LAB — CBC
HCT: 47 % — ABNORMAL HIGH (ref 36.0–46.0)
Hemoglobin: 15 g/dL (ref 12.0–15.0)
MCH: 27.9 pg (ref 26.0–34.0)
MCHC: 31.9 g/dL (ref 30.0–36.0)
MCV: 87.4 fL (ref 80.0–100.0)
Platelets: 203 10*3/uL (ref 150–400)
RBC: 5.38 MIL/uL — ABNORMAL HIGH (ref 3.87–5.11)
RDW: 15.3 % (ref 11.5–15.5)
WBC: 3.6 10*3/uL — ABNORMAL LOW (ref 4.0–10.5)
nRBC: 0 % (ref 0.0–0.2)

## 2019-07-06 LAB — COMPREHENSIVE METABOLIC PANEL
ALT: 17 U/L (ref 0–44)
AST: 31 U/L (ref 15–41)
Albumin: 2 g/dL — ABNORMAL LOW (ref 3.5–5.0)
Alkaline Phosphatase: 57 U/L (ref 38–126)
Anion gap: 15 (ref 5–15)
BUN: 74 mg/dL — ABNORMAL HIGH (ref 8–23)
CO2: 21 mmol/L — ABNORMAL LOW (ref 22–32)
Calcium: 8.1 mg/dL — ABNORMAL LOW (ref 8.9–10.3)
Chloride: 102 mmol/L (ref 98–111)
Creatinine, Ser: 4.04 mg/dL — ABNORMAL HIGH (ref 0.44–1.00)
GFR calc Af Amer: 11 mL/min — ABNORMAL LOW (ref >60)
GFR calc non Af Amer: 9 mL/min — ABNORMAL LOW (ref >60)
Glucose, Bld: 131 mg/dL — ABNORMAL HIGH (ref 70–99)
Potassium: 5 mmol/L (ref 3.5–5.1)
Sodium: 138 mmol/L (ref 135–145)
Total Bilirubin: 0.8 mg/dL (ref 0.3–1.2)
Total Protein: 5.7 g/dL — ABNORMAL LOW (ref 6.5–8.1)

## 2019-07-06 LAB — PROTIME-INR
INR: 3.1 — ABNORMAL HIGH (ref 0.8–1.2)
Prothrombin Time: 31.9 seconds — ABNORMAL HIGH (ref 11.4–15.2)

## 2019-07-06 LAB — GLUCOSE, CAPILLARY
Glucose-Capillary: 136 mg/dL — ABNORMAL HIGH (ref 70–99)
Glucose-Capillary: 99 mg/dL (ref 70–99)
Glucose-Capillary: 120 mg/dL — ABNORMAL HIGH (ref 70–99)
Glucose-Capillary: 123 mg/dL — ABNORMAL HIGH (ref 70–99)

## 2019-07-06 MED ORDER — WARFARIN SODIUM 1 MG PO TABS
1.0000 mg | ORAL_TABLET | Freq: Once | ORAL | Status: AC
  Administered 2019-07-06: 1 mg via ORAL
  Filled 2019-07-06: qty 1

## 2019-07-06 MED ORDER — HYDRALAZINE HCL 10 MG PO TABS: 10.0000 mg | ORAL_TABLET | Freq: Four times a day (QID) | ORAL | Status: DC | PRN

## 2019-07-06 MED ORDER — AMLODIPINE BESYLATE 5 MG PO TABS
5.0000 mg | ORAL_TABLET | Freq: Every day | ORAL | Status: DC
  Administered 2019-07-06 – 2019-07-07 (×2): 5 mg via ORAL
  Filled 2019-07-06 (×2): qty 1

## 2019-07-06 MED ORDER — SODIUM CHLORIDE 0.9 % IV SOLN: INTRAVENOUS | Status: AC

## 2019-07-06 NOTE — Progress Notes (Signed)
ANTICOAGULATION CONSULT NOTE - Initial Consult  Pharmacy Consult for warfarin Indication: atrial fibrillation (CHADS2VASc = 7)  No Known Allergies  Patient Measurements: Height: 5\' 2"  (157.5 cm) Weight: 148 lb 13 oz (67.5 kg) IBW/kg (Calculated) : 50.1   Vital Signs: Temp: 97.8 F (36.6 C) (01/15 0542) Temp Source: Oral (01/15 0542) BP: 155/94 (01/15 0542) Pulse Rate: 79 (01/15 0516)  Labs: Recent Labs    07/05/19 0017 07/05/19 0017 07/05/19 0614 07/06/19 0149  HGB 15.6*   < > 15.6* 15.0  HCT 48.4*  --  48.5* 47.0*  PLT 233  --  233 203  LABPROT  --   --  32.0* 31.9*  INR  --   --  3.1* 3.1*  CREATININE 3.42*  --  3.58* 4.04*  TROPONINIHS  --   --  35*  --    < > = values in this interval not displayed.    Estimated Creatinine Clearance: 8.7 mL/min (A) (by C-G formula based on SCr of 4.04 mg/dL (H)).   Assessment: 86 yo W on warfarin for afib (CHADS2VASc = 7). Followed by Dr. Claris Gower [facility], [phone], who's staff said patient was last seen 06/11/19 and instructed to take 6mg -3mg -3mg -3mg  with follow up on 06/25/19, which the patient missed. Per patient, she's taking warfarin 6mg -6mg -3mg -3mg -3mg -6mg . Unclear from both sources which days of the week are 3mg  vs 6mg . Confirmed she took warfarin on 07/03/19 before admission. D-dimer < 5.   INR 3.1 today. Poor PO at least 3 days before admission, eating 10% here. Will continue small dose to prevent drop.    PTA Warfarin: 3-6mg , unclear exact dose but likely 3mg  for 3-4 days/wk and 6mg  all other days.  Goal of Therapy:  INR 2-3 Monitor platelets by anticoagulation protocol: Yes   Plan:  Warfarin 1mg  x1  Daily INR, CBC, monitor for bleeding   Benetta Spar, PharmD, BCPS, BCCP Clinical Pharmacist  Please check AMION for all [place] phone numbers After 10:00 PM, call [place] [phone]

## 2019-07-06 NOTE — Progress Notes (Signed)
PROGRESS NOTE   Patricia Randall  [id number]    DOB: December 07, 1928    DOA: 07/04/2019  PCP: Leonard Downing, MD   I have briefly reviewed patients previous medical records in [facility].  Chief Complaint:   Chief Complaint  Patient presents with  . Failure To Thrive    Brief Narrative:  86 year old female patient with PMH of atrial fibrillation on Warfarin, chronic systolic CHF, LVEF [id number] by TTE 09/27/2017, hyperlipidemia, hypertension, type II DM, stage IV CKD (baseline creatinine ~1.8), presented to the ER due to increasing fatigue, weakness, poor appetite, decreased oral intake, decreased mobility and laying in the bed all the time for the last 3 days.  Transiently in A. fib with RVR in the ED, improved after IV fluids.  Admitted for acute on chronic kidney disease due to COVID-19 infection with associated poor oral intake and dehydration.  Despite IV hydration, acute kidney injury worsened, creatinine >4 and nephrology consulted on 1/15.   Assessment & Plan:  Principal Problem:   COVID-19 virus infection Active Problems:   Permanent atrial fibrillation (HCC)   Cardiomyopathy ([place])   DM (diabetes mellitus), type 2 with renal complications (HCC)   AKI (acute kidney injury) ([place])   Acute kidney injury complicating CKD stage IV  Most likely due to COVID-19 infection related to poor oral intake and continued Lasix use.  Held Lasix temporarily.  Briefly hydrated with IV fluids.  Encouraged oral intake.  Baseline creatinine ~1.8, presented with creatinine of 3.42.  Trend daily BMP.  RD evaluated and plans to liberalize diet to regular with nutritional supplements to encourage oral intake.  Despite increasing IV fluids, creatinine increased to >4 and nephrology consulted on 1/15.  Renal ultrasound with chronic medical disease and no hydronephrosis.  Nephrology input appreciated.  Continue hydration and supportive treatment and follow BMP.  Patient declines HD and would not be a  candidate anyway.  COVID-19 infection  Continue IV remdesivir.  Decadron not started since patient was not hypoxic.  Chest x-ray showed no acute abnormalities.  A. fib with RVR  Transient RVR in the ED which improved after IV fluid hydration.  Continue Toprol-XL 100 mg daily.  Has communicated with pharmacy, reduced digoxin dose from 0.0625 mg daily to every other day pending improvement in renal insufficiency.  Digoxin level normal.  Continue Coumadin per pharmacy/INR therapeutic 3.1.  Chronic systolic CHF/cardiomyopathy  TTE 09/27/2017: LVEF 30-25%.  Clinically dehydrated on admission and now.  Hold Lasix.  Continue gentle IV fluids and resume Lasix when able, possibly at discharge.  Does not appear volume overloaded.  Essential hypertension  Continue Toprol-XL 100 mg daily.  Uncontrolled despite this, start amlodipine 5 mg daily and titrate up as needed.  May consider as needed hydralazine.  Hyperlipidemia  Continue statins.  Type II DM with renal complications  Hyperglycemia now worsened by steroids.  Managed with SSI and adjust insulins as needed.  Reasonable inpatient control.  Body mass index is 27.22 kg/m.  Nutritional Status Nutrition Problem: Increased nutrient needs Etiology: acute illness, catabolic [id number] Signs/Symptoms: estimated needs Interventions: Ensure Enlive (each supplement provides 350kcal and 20 grams of protein), Liberalize Diet  DVT prophylaxis: Anticoagulated on warfarin. Code Status: Full Family Communication: None at bedside. I discussed with patient's son via phone on 1/14, updated care and answered questions. Disposition: Patient admitted from home.  Plan will be to discharge her home pending clinical improvement and therapies evaluation.   Consultants:   None  Procedures:   None  Antimicrobials:  None   Subjective:  Ongoing poor appetite.  Denies dyspnea or pain  Objective:   Vitals:   July 21, 2019 0800 July 21, 2019 0905  07-21-19 1200 Jul 21, 2019 1600  BP: (!) 181/101 (!) 160/95 (!) 166/111 (!) 166/118  Pulse: 79 76 73 (!) 105  Resp: 13 18 (!) 23 (!) 22  Temp:   97.9 F (36.6 C)   TempSrc:   Oral   SpO2: 93%     Weight:      Height:        General exam: Pleasant elderly female, moderately built and frail sitting up comfortably in bed, still working on her breakfast.  Oral mucosa with borderline hydration. Respiratory system: Clear to auscultation.  No increased work of breathing. Cardiovascular system: S1 & S2 heard, irregularly irregular. No JVD, murmurs, rubs, gallops or clicks. No pedal edema.  Telemetry personally reviewed: A. fib with controlled ventricular rate. Gastrointestinal system: Abdomen is nondistended, soft and nontender. No organomegaly or masses felt. Normal bowel sounds heard. Central nervous system: Alert and oriented to self and place. No focal neurological deficits. Extremities: Symmetric 5 x 5 power. Skin: No rashes, lesions or ulcers Psychiatry: Judgement and insight appear somewhat impaired. Mood & affect pleasant and appropriate.     Data Reviewed:   I have personally reviewed following labs and imaging studies   CBC: Recent Labs  Lab 07/05/19 0017 07/05/19 0614 21-Jul-2019 0149  WBC 4.7 4.4 3.6*  NEUTROABS  --  3.4  --   HGB 15.6* 15.6* 15.0  HCT 48.4* 48.5* 47.0*  MCV 88.5 86.8 87.4  PLT 233 233 [id number]    Basic Metabolic Panel: Recent Labs  Lab 07/05/19 0017 07/05/19 0614 2019-07-21 0149  NA 138 138 138  K 4.5 4.8 5.0  CL 101 98 102  CO2 22 22 21*  GLUCOSE 148* 183* 131*  BUN 57* 63* 74*  CREATININE 3.42* 3.58* 4.04*  CALCIUM 8.2* 8.1* 8.1*  MG  --  1.8  --     Liver Function Tests: Recent Labs  Lab 07/05/19 0017 07/05/19 0614 07/21/19 0149  AST 27 24 31   ALT 15 15 17   ALKPHOS 65 61 57  BILITOT 0.9 1.2 0.8  PROT 6.7 6.0* 5.7*  ALBUMIN 2.3* 2.1* 2.0*    CBG: Recent Labs  Lab 07/21/19 0817 07-21-19 1214 07-21-2019 1643  GLUCAP 136* 99 120*     Microbiology Studies:  No results found for this or any previous visit (from the past 240 hour(s)).   Radiology Studies:  US Renal  Result Date: 07/21/2019 CLINICAL DATA:  Acute renal injury. EXAM: RENAL / URINARY TRACT ULTRASOUND COMPLETE COMPARISON:  No prior. FINDINGS: Right Kidney: Renal measurements: 9.8 x 4.5 x 3.9 cm = volume: 91.9 mL. Increased echogenicity. No mass or hydronephrosis visualized. Left Kidney: Renal measurements: 10.0 x 5.3 x 5.7 cm = volume: 158.7 mL. Increased echogenicity. No mass or hydronephrosis visualized. Bladder: Appears normal for degree of bladder distention. Other: None. IMPRESSION: 1. Increased echogenicity both kidneys consistent with chronic medical renal disease. 2.  No acute abnormality.  No hydronephrosis or bladder distention. Electronically Signed   By: Marcello Moores  Register   On: 07/21/19 07:58     Scheduled Meds:   . digoxin  0.0625 mg Oral QODAY  . feeding supplement (ENSURE ENLIVE)  237 mL Oral BID BM  . insulin aspart  0-9 Units Subcutaneous TID WC  . latanoprost  1 drop Both Eyes QHS  . metoprolol succinate  100 mg Oral Daily  . pravastatin  40 mg Oral q1800  . Warfarin - Pharmacist Dosing Inpatient   Does not apply q1800    Continuous Infusions:   . sodium chloride 75 mL/hr at 07/06/19 0824  . remdesivir 100 mg in NS 100 mL 100 mg (07/06/19 1031)     LOS: 1 day     Vernell Leep, MD, [place], [facility]    To contact the attending provider between 7A-7P or the covering provider during after hours 7P-7A, please log into the web site [email or web address] and access using universal [place] password for that web site. If you do not have the password, please call the hospital operator.  07/06/2019, 5:44 PM

## 2019-07-06 NOTE — Consult Note (Signed)
[facility] Renal Consultation Note  Requesting MD: Hongalgi Indication for Consultation: A on CRF  HPI:  Patricia Randall is a 86 y.o. female with CHF (EF 30-35%), afib on coumadin, HTN, DM, CKD appears renal function baseline in high 1's up to 2.  She presented to ER on 1/14 with basically FTT-  Found to be COVID positive and in Afib with RVR.  and crt was 3.4.  Was admitted and hydrated but crt trending up to 4.0 today so I was consulted.  Only 300 of UOP recorded. She and nursing endorse one void today that was not counted.   Urine shows greater than 300 of prot , 11- 20 RBCs and WBCs.  Renal u/s today showing around 10 CM kidneys, inc echogenicity but no hydro.  BP has not been low-  She does not appear to have been on ACE or ARB or NSAIDS however she was on lasix as OP.  She tells me her son has been on dialysis and now has a kidney transplant.  She lives with her son but is independant with ADLs  Creatinine, Ser  Date/Time Value Ref Range Status  07/06/2019 01:49 AM 4.04 (H) 0.44 - 1.00 mg/dL Final  07/05/2019 06:14 AM 3.58 (H) 0.44 - 1.00 mg/dL Final  07/05/2019 12:17 AM 3.42 (H) 0.44 - 1.00 mg/dL Final  02/07/2018 10:35 AM 1.80 (H) 0.57 - 1.00 mg/dL Final  12/07/2017 10:02 AM 2.29 (H) 0.57 - 1.00 mg/dL Final  11/25/2017 07:59 AM 1.79 (H) 0.44 - 1.00 mg/dL Final  11/24/2017 08:06 AM 1.98 (H) 0.44 - 1.00 mg/dL Final    Comment:    DELTA CHECK NOTED NURSE WANTS RESULTS    11/24/2017 04:53 AM 4.18 (H) 0.44 - 1.00 mg/dL Final    Comment:    DELTA CHECK NOTED  11/23/2017 05:27 AM 1.83 (H) 0.44 - 1.00 mg/dL Final  11/22/2017 05:52 AM 1.86 (H) 0.44 - 1.00 mg/dL Final  11/21/2017 04:49 AM 1.89 (H) 0.44 - 1.00 mg/dL Final  11/20/2017 11:17 AM 1.87 (H) 0.44 - 1.00 mg/dL Final  11/15/2017 06:22 AM 2.00 (H) 0.44 - 1.00 mg/dL Final  11/14/2017 03:08 AM 1.69 (H) 0.44 - 1.00 mg/dL Final  11/13/2017 04:09 PM 1.63 (H) 0.44 - 1.00 mg/dL Final  09/30/2017 06:41 AM 2.58 (H) 0.44 - 1.00  mg/dL Final  09/29/2017 05:03 AM 2.86 (H) 0.44 - 1.00 mg/dL Final  09/28/2017 04:55 AM 2.08 (H) 0.44 - 1.00 mg/dL Final  09/27/2017 06:00 AM 2.09 (H) 0.44 - 1.00 mg/dL Final  09/26/2017 11:43 AM 2.22 (H) 0.44 - 1.00 mg/dL Final  09/17/2017 02:58 PM 1.84 (H) 0.44 - 1.00 mg/dL Final  09/02/2017 01:54 PM 1.79 (H) 0.44 - 1.00 mg/dL Final  07/05/2017 11:00 AM 1.58 (H) 0.44 - 1.00 mg/dL Final  05/25/2016 11:55 AM 1.71 (H) 0.44 - 1.00 mg/dL Final  05/24/2016 03:03 PM 1.87 (H) 0.44 - 1.00 mg/dL Final  07/05/2015 06:33 AM 1.65 (H) 0.44 - 1.00 mg/dL Final    Comment:    DELTA CHECK NOTED  07/04/2015 05:50 AM 3.50 (H) 0.44 - 1.00 mg/dL Final  07/03/2015 02:25 PM 4.44 (H) 0.44 - 1.00 mg/dL Final  09/27/2013 12:00 PM 1.30 (H) 0.50 - 1.10 mg/dL Final  05/14/2013 11:32 AM 1.10 0.50 - 1.10 mg/dL Final  04/04/2013 05:10 AM 1.05 0.50 - 1.10 mg/dL Final  04/03/2013 03:50 AM 1.07 0.50 - 1.10 mg/dL Final  04/02/2013 04:05 AM 1.24 (H) 0.50 - 1.10 mg/dL Final  04/01/2013 04:25 AM 1.20 (H) 0.50 -  1.10 mg/dL Final  03/31/2013 02:45 AM 1.16 (H) 0.50 - 1.10 mg/dL Final  03/30/2013 12:18 PM 1.10 0.50 - 1.10 mg/dL Final     PMHx:   Past Medical History:  Diagnosis Date  . Atrial fibrillation ([place])    a. 03/2013 s/p TEE/DCCV;  b. 03/2013 Eliquis initiated.  . Chronic systolic CHF (congestive heart failure) ([place])    a. 03/2013 Echo: EF 20-25%, diff HK, mild to mod MR, sev dil LA, mild RV dysfxn, sev dil RA, mod TR.  . Claudication ([place])   . Glaucoma   . High cholesterol   . Hypertension   . Type II diabetes mellitus ([place])     Past Surgical History:  Procedure Laterality Date  . CARDIOVERSION N/A 04/02/2013   Procedure: CARDIOVERSION;  Surgeon: Thayer Headings, MD;  Location: [facility];  Service: Cardiovascular;  Laterality: N/A;  Spoke with Tom   . ESOPHAGOGASTRODUODENOSCOPY N/A 05/26/2016   Procedure: ESOPHAGOGASTRODUODENOSCOPY (EGD);  Surgeon: Ladene Artist, MD;  Location[facility] END[facility]COPY;   Service: Endoscopy;  Laterality: N/A;  . EYE SURGERY Right    "had blood in it"   . TEE WITHOUT CARDIOVERSION N/A 04/02/2013   Procedure: TRANSESOPHAGEAL ECHOCARDIOGRAM (TEE);  Surgeon: Thayer Headings, MD;  Location: [facility] ENDOSCOPY;  Service: Cardiovascular;  Laterality: N/A;    Family Hx:  Family History  Problem Relation Age of Onset  . Other Mother        died in her 14's - 'just got sick.'  . Other Father        died @ 85, unknown cause.  . Diabetes Brother   . Other Other        12 siblings + 7 more 1/2 siblings.    Social History:  reports that she has never smoked. She has never used smokeless tobacco. She reports that she does not drink alcohol or use drugs.  Allergies: No Known Allergies  Medications: Prior to Admission medications   Medication Sig Start Date End Date Taking? Authorizing Provider  digoxin (LANOXIN) 0.125 MG tablet Take 0.0625 mg by mouth daily.    Yes [provider]  glipiZIDE (GLUCOTROL XL) 10 MG 24 hr tablet Take 10 mg by mouth 2 (two) times daily with a meal.    Yes [provider]  ACCU-CHEK AVIVA PLUS test strip TO CHECK BLOOD GLUCOSE ONCE DAILY **DX CODE: E11.9** 01/30/18   [provider]  ACCU-CHEK SOFTCLIX LANCETS lancets USE TO CHECK BLOOD GLUCOSE ONCE DAILY **DX CODE: E11.9** 01/30/18   [provider]  Blood Glucose Monitoring Suppl (ACCU-CHEK AVIVA PLUS) w/Device KIT TO CHECK BLOOD GLUCOSE ONCE DAILY **DX CODE: E11.9** 01/30/18   [provider]  cholecalciferol (VITAMIN D) 1000 units tablet Take 1,000 Units by mouth daily.    [provider]  feeding supplement, GLUCERNA SHAKE, (GLUCERNA SHAKE) LIQD Take 237 mLs by mouth daily.    [provider]  furosemide (LASIX) 40 MG tablet Take 40 mg by mouth daily.    [provider]  latanoprost (XALATAN) 0.005 % ophthalmic solution Place 1 drop into both eyes at bedtime.    [provider]  lovastatin (MEVACOR) 40 MG tablet  Take 1 tablet (40 mg total) by mouth at bedtime. 11/15/17   Roxan Hockey, MD  metoprolol succinate (TOPROL-XL) 100 MG 24 hr tablet Take 1 tablet (100 mg total) by mouth daily. Take with or immediately following a meal. 11/25/17   Rai, Vernelle Emerald, MD  polyethylene glycol (MIRALAX / GLYCOLAX) packet  Take 17 g by mouth daily as needed for mild constipation. 09/29/17   Lavina Hamman, MD  warfarin (COUMADIN) 3 MG tablet Take 3-6 mg by mouth as directed.     [provider]    I have reviewed the patient's current medications.  Labs:  Results for orders placed or performed during the hospital encounter of 07/04/19 (from the past 48 hour(s))  Lipase, blood     Status: None   Collection Time: 07/05/19 12:17 AM  Result Value Ref Range   Lipase 25 11 - 51 U/L    Comment: Performed at [facility], [place] [address] [place], [place] [address]  Comprehensive metabolic panel     Status: Abnormal   Collection Time: 07/05/19 12:17 AM  Result Value Ref Range   Sodium 138 135 - 145 mmol/L   Potassium 4.5 3.5 - 5.1 mmol/L   Chloride 101 98 - 111 mmol/L   CO2 22 22 - 32 mmol/L   Glucose, Bld 148 (H) 70 - 99 mg/dL   BUN 57 (H) 8 - 23 mg/dL   Creatinine, Ser 3.42 (H) 0.44 - 1.00 mg/dL   Calcium 8.2 (L) 8.9 - 10.3 mg/dL   Total Protein 6.7 6.5 - 8.1 g/dL   Albumin 2.3 (L) 3.5 - 5.0 g/dL   AST 27 15 - 41 U/L   ALT 15 0 - 44 U/L   Alkaline Phosphatase 65 38 - 126 U/L   Total Bilirubin 0.9 0.3 - 1.2 mg/dL   GFR calc non Af Amer 11 (L) >60 mL/min   GFR calc Af Amer 13 (L) >60 mL/min   Anion gap 15 5 - 15    Comment: Performed at [facility] [address] [place], [place] [address]  CBC     Status: Abnormal   Collection Time: 07/05/19 12:17 AM  Result Value Ref Range   WBC 4.7 4.0 - 10.5 K/uL   RBC 5.47 (H) 3.87 - 5.11 MIL/uL   Hemoglobin 15.6 (H) 12.0 - 15.0 g/dL   HCT 48.4 (H) 36.0 - 46.0 %   MCV 88.5 80.0 - 100.0 fL   MCH 28.5 26.0 - 34.0 pg   MCHC 32.2 30.0 - 36.0 g/dL    RDW 15.2 11.5 - 15.5 %   Platelets 233 150 - 400 K/uL   nRBC 0.0 0.0 - 0.2 %    Comment: Performed at [facility], [place] [address] [place], [place] [address]  POC SARS Coronavirus 2 Ag-ED - Nasal Swab (BD Veritor Kit)     Status: Abnormal   Collection Time: 07/05/19 12:30 AM  Result Value Ref Range   SARS Coronavirus 2 Ag POSITIVE (A) NEGATIVE    Comment: (NOTE) SARS-CoV-2 antigen PRESENT. Positive results indicate the presence of viral antigens, but clinical correlation with patient history and other diagnostic information is necessary to determine patient infection status.  Positive results do not rule out bacterial infection or co-infection  with other viruses. False positive results are rare but can occur, and confirmatory RT-PCR testing may be appropriate in some circumstances. The expected result is Negative. Fact Sheet for Patients: PodPark.tn Fact Sheet for Providers: GiftContent.is  This test is not yet approved or cleared by the [place] FDA and  has been authorized for detection and/or diagnosis of SARS-CoV-2 by FDA under an Emergency Use Authorization (EUA).  This EUA will remain in effect (meaning this test can be used) for the duration of  the COVID-19 declaration under  Section 564(b)(1) of the Act, 21 U.S.C. section 360bbb-3(b)(1), unless the a uthorization is terminated or revoked sooner.   Digoxin level     Status: None   Collection Time: 07/05/19  6:14 AM  Result Value Ref Range   Digoxin Level 0.8 0.8 - 2.0 ng/mL    Comment: Performed at [facility] [address] [place], [place] [address]  D-dimer, quantitative (not at [facility]     Status: Abnormal   Collection Time: 07/05/19  6:14 AM  Result Value Ref Range   D-Dimer, Quant 0.56 (H) 0.00 - 0.50 ug/mL-FEU    Comment: (NOTE) At the manufacturer cut-off of 0.50 ug/mL FEU, this assay has been documented to exclude PE with a  sensitivity and negative predictive value of 97 to 99%.  At this time, this assay has not been approved by the FDA to exclude DVT/VTE. Results should be correlated with clinical presentation. Performed at [facility], [place] [address], [place], [place] [address]   Comprehensive metabolic panel     Status: Abnormal   Collection Time: 07/05/19  6:14 AM  Result Value Ref Range   Sodium 138 135 - 145 mmol/L   Potassium 4.8 3.5 - 5.1 mmol/L   Chloride 98 98 - 111 mmol/L   CO2 22 22 - 32 mmol/L   Glucose, Bld 183 (H) 70 - 99 mg/dL   BUN 63 (H) 8 - 23 mg/dL   Creatinine, Ser 3.58 (H) 0.44 - 1.00 mg/dL   Calcium 8.1 (L) 8.9 - 10.3 mg/dL   Total Protein 6.0 (L) 6.5 - 8.1 g/dL   Albumin 2.1 (L) 3.5 - 5.0 g/dL   AST 24 15 - 41 U/L   ALT 15 0 - 44 U/L   Alkaline Phosphatase 61 38 - 126 U/L   Total Bilirubin 1.2 0.3 - 1.2 mg/dL   GFR calc non Af Amer 11 (L) >60 mL/min   GFR calc Af Amer 12 (L) >60 mL/min   Anion gap 18 (H) 5 - 15    Comment: Performed at [facility], [place] [address] [place], [place] [address]  C-reactive protein     Status: Abnormal   Collection Time: 07/05/19  6:14 AM  Result Value Ref Range   CRP 4.8 (H) <1.0 mg/dL    Comment: Performed at [facility] [address] [place], [place] [address]  CBC with Differential/Platelet     Status: Abnormal   Collection Time: 07/05/19  6:14 AM  Result Value Ref Range   WBC 4.4 4.0 - 10.5 K/uL   RBC 5.59 (H) 3.87 - 5.11 MIL/uL   Hemoglobin 15.6 (H) 12.0 - 15.0 g/dL   HCT 48.5 (H) 36.0 - 46.0 %   MCV 86.8 80.0 - 100.0 fL   MCH 27.9 26.0 - 34.0 pg   MCHC 32.2 30.0 - 36.0 g/dL   RDW 15.3 11.5 - 15.5 %   Platelets 233 150 - 400 K/uL   nRBC 0.0 0.0 - 0.2 %   Neutrophils Relative % 76 %   Neutro Abs 3.4 1.7 - 7.7 K/uL   Lymphocytes Relative 13 %   Lymphs Abs 0.6 (L) 0.7 - 4.0 K/uL   Monocytes Relative 8 %   Monocytes Absolute 0.4 0.1 - 1.0 K/uL   Eosinophils Relative 0 %   Eosinophils Absolute 0.0 0.0 - 0.5  K/uL   Basophils Relative 1 %   Basophils Absolute 0.0 0.0 - 0.1 K/uL   Immature Granulocytes 2 %  Abs Immature Granulocytes 0.08 (H) 0.00 - 0.07 K/uL    Comment: Performed at [facility], [place] [address], [place],  [address]  Procalcitonin     Status: None   Collection Time: 07/05/19  6:14 AM  Result Value Ref Range   Procalcitonin 0.19 ng/mL    Comment:        Interpretation: PCT (Procalcitonin) <= 0.5 ng/mL: Systemic infection (sepsis) is not likely. Local bacterial infection is possible. (NOTE)       Sepsis PCT Algorithm           Lower Respiratory Tract                                      Infection PCT Algorithm    ----------------------------     ----------------------------         PCT < 0.25 ng/mL                PCT < 0.10 ng/mL         Strongly encourage             Strongly discourage   discontinuation of antibiotics    initiation of antibiotics    ----------------------------     -----------------------------       PCT 0.25 - 0.50 ng/mL            PCT 0.10 - 0.25 ng/mL               OR       >80% decrease in PCT            Discourage initiation of                                            antibiotics      Encourage discontinuation           of antibiotics    ----------------------------     -----------------------------         PCT >= 0.50 ng/mL              PCT 0.26 - 0.50 ng/mL               AND        <80% decrease in PCT             Encourage initiation of                                             antibiotics       Encourage continuation           of antibiotics    ----------------------------     -----------------------------        PCT >= 0.50 ng/mL                  PCT > 0.50 ng/mL               AND         increase in PCT                  Strongly encourage  initiation of antibiotics    Strongly encourage escalation           of antibiotics                                      -----------------------------                                           PCT <= 0.25 ng/mL                                                 OR                                        > 80% decrease in PCT                                     Discontinue / Do not initiate                                             antibiotics Performed at [facility], [address] [place], [place] [address]   Troponin I (High Sensitivity)     Status: Abnormal   Collection Time: 07/05/19  6:14 AM  Result Value Ref Range   Troponin I (High Sensitivity) 35 (H) <18 ng/L    Comment: (NOTE) Elevated high sensitivity troponin I (hsTnI) values and significant  changes across serial measurements may suggest ACS but many other  chronic and acute conditions are known to elevate hsTnI results.  Refer to the "Links" section for chest pain algorithms and additional  guidance. Performed at [facility], [place] [address] [place], [place] [address]   Ferritin     Status: None   Collection Time: 07/05/19  6:14 AM  Result Value Ref Range   Ferritin 91 11 - 307 ng/mL    Comment: Performed at [facility] [address] [place], [place] [address]  Magnesium     Status: None   Collection Time: 07/05/19  6:14 AM  Result Value Ref Range   Magnesium 1.8 1.7 - 2.4 mg/dL    Comment: Performed at [facility] [address] [place], [place] [address]  Protime-INR     Status: Abnormal   Collection Time: 07/05/19  6:14 AM  Result Value Ref Range   Prothrombin Time 32.0 (H) 11.4 - 15.2 seconds   INR 3.1 (H) 0.8 - 1.2    Comment: (NOTE) INR goal varies based on device and disease states. Performed at [facility], [place] [address], [place], [place] [address]   Glucose, capillary     Status: Abnormal   Collection Time: 07/05/19  8:05 AM  Result Value Ref Range   Glucose-Capillary 179 (H) 70 - 99 mg/dL  Glucose, capillary     Status: Abnormal   Collection Time: 07/05/19  11:45 AM  Result  Value Ref Range   Glucose-Capillary 213 (H) 70 - 99 mg/dL  Glucose, capillary     Status: Abnormal   Collection Time: 07/05/19  4:47 PM  Result Value Ref Range   Glucose-Capillary 161 (H) 70 - 99 mg/dL  Glucose, capillary     Status: None   Collection Time: 07/05/19  9:04 PM  Result Value Ref Range   Glucose-Capillary 78 70 - 99 mg/dL  Urinalysis, Routine w reflex microscopic     Status: Abnormal   Collection Time: 07/05/19 10:21 PM  Result Value Ref Range   Color, Urine YELLOW YELLOW   APPearance HAZY (A) CLEAR   Specific Gravity, Urine 1.024 1.005 - 1.030   pH 5.0 5.0 - 8.0   Glucose, UA NEGATIVE NEGATIVE mg/dL   Hgb urine dipstick SMALL (A) NEGATIVE   Bilirubin Urine NEGATIVE NEGATIVE   Ketones, ur NEGATIVE NEGATIVE mg/dL   Protein, ur >=300 (A) NEGATIVE mg/dL   Nitrite NEGATIVE NEGATIVE   Leukocytes,Ua NEGATIVE NEGATIVE   RBC / HPF 11-20 0 - 5 RBC/hpf   WBC, UA 11-20 0 - 5 WBC/hpf   Bacteria, UA RARE (A) NONE SEEN   Squamous Epithelial / LPF 0-5 0 - 5   Mucus PRESENT    Hyaline Casts, UA PRESENT     Comment: Performed at [facility], [address], [place], [place] [address]  Comprehensive metabolic panel     Status: Abnormal   Collection Time: 07/06/19  1:49 AM  Result Value Ref Range   Sodium 138 135 - 145 mmol/L   Potassium 5.0 3.5 - 5.1 mmol/L   Chloride 102 98 - 111 mmol/L   CO2 21 (L) 22 - 32 mmol/L   Glucose, Bld 131 (H) 70 - 99 mg/dL   BUN 74 (H) 8 - 23 mg/dL   Creatinine, Ser 4.04 (H) 0.44 - 1.00 mg/dL   Calcium 8.1 (L) 8.9 - 10.3 mg/dL   Total Protein 5.7 (L) 6.5 - 8.1 g/dL   Albumin 2.0 (L) 3.5 - 5.0 g/dL   AST 31 15 - 41 U/L   ALT 17 0 - 44 U/L   Alkaline Phosphatase 57 38 - 126 U/L   Total Bilirubin 0.8 0.3 - 1.2 mg/dL   GFR calc non Af Amer 9 (L) >60 mL/min   GFR calc Af Amer 11 (L) >60 mL/min   Anion gap 15 5 - 15    Comment: Performed at [facility], [place] [address] [place], [place] [address]  Protime-INR     Status: Abnormal    Collection Time: 07/06/19  1:49 AM  Result Value Ref Range   Prothrombin Time 31.9 (H) 11.4 - 15.2 seconds   INR 3.1 (H) 0.8 - 1.2    Comment: (NOTE) INR goal varies based on device and disease states. Performed at [facility], [place] [address], [place], [place] [address]   CBC     Status: Abnormal   Collection Time: 07/06/19  1:49 AM  Result Value Ref Range   WBC 3.6 (L) 4.0 - 10.5 K/uL   RBC 5.38 (H) 3.87 - 5.11 MIL/uL   Hemoglobin 15.0 12.0 - 15.0 g/dL   HCT 47.0 (H) 36.0 - 46.0 %   MCV 87.4 80.0 - 100.0 fL   MCH 27.9 26.0 - 34.0 pg   MCHC 31.9 30.0 - 36.0 g/dL   RDW 15.3 11.5 - 15.5 %   Platelets 203 150 - 400 K/uL  nRBC 0.0 0.0 - 0.2 %    Comment: Performed at [facility], [place] [address] [place], [place] [address]  Glucose, capillary     Status: Abnormal   Collection Time: 07/06/19  8:17 AM  Result Value Ref Range   Glucose-Capillary 136 (H) 70 - 99 mg/dL  Glucose, capillary     Status: None   Collection Time: 07/06/19 12:14 PM  Result Value Ref Range   Glucose-Capillary 99 70 - 99 mg/dL     ROS:  A comprehensive review of systems was negative except for: Gastrointestinal: positive for reflux symptoms and decreased appetite  Physical Exam: Vitals:   07/06/19 0542 07/06/19 1200  BP: (!) 155/94   Pulse:    Resp: 17   Temp: 97.8 F (36.6 C) 97.9 F (36.6 C)  SpO2: 97%      General: elderly but well appearing BF-  Oriented- comprehended  HEENT: PERRLA, EOMI Neck: no JVD Heart: irreg Lungs: mostly clear Abdomen: soft, non tender-  She points to upper abdomen but more a sensation of not wanting to eat, no nausea Extremities: no edema Skin: warm and dry  Neuro: alert and oriented- non focal  Assessment/Plan: 86 year old BF with baseline medical issues including CKD (crt 2 at baseline) who presents with COVID and A on CRF 1.Renal- A on CRF in the setting of COVID.  I cannot detect any nephrotoxins and there have been no significant hemodynamic  derangements other than brief Afib with RVR.  She is making some urine.  U/A and ultrasound are fairly unrevealing.  There are no indications for HD and I am not sure I would offer it due to her age- possibly short term only.  Agree with management to date-  Hydration and supportive treatment of COVID.  I do not have any particular suggestions.  Will cont to follow renal function and UOP 2. Hypertension/volume  - BP is not low but her hgb is high suggesting volume depletion and also her exam does not look like volume overload.  Agree with IVF 3.  Metabolic acidosis and borderline hyperkalemia-  No action for now-  Is due to #1 4. Anemia  - hgb 15-  Argues for volume depletion   Louis Meckel 07/06/2019, 4:13 PM

## 2019-07-07 LAB — COMPREHENSIVE METABOLIC PANEL
ALT: 14 U/L (ref 0–44)
AST: 26 U/L (ref 15–41)
Albumin: 1.7 g/dL — ABNORMAL LOW (ref 3.5–5.0)
Alkaline Phosphatase: 57 U/L (ref 38–126)
Anion gap: 18 — ABNORMAL HIGH (ref 5–15)
BUN: 86 mg/dL — ABNORMAL HIGH (ref 8–23)
CO2: 15 mmol/L — ABNORMAL LOW (ref 22–32)
Calcium: 7.6 mg/dL — ABNORMAL LOW (ref 8.9–10.3)
Chloride: 107 mmol/L (ref 98–111)
Creatinine, Ser: 4.28 mg/dL — ABNORMAL HIGH (ref 0.44–1.00)
GFR calc Af Amer: 10 mL/min — ABNORMAL LOW (ref >60)
GFR calc non Af Amer: 9 mL/min — ABNORMAL LOW (ref >60)
Glucose, Bld: 116 mg/dL — ABNORMAL HIGH (ref 70–99)
Potassium: 5 mmol/L (ref 3.5–5.1)
Sodium: 140 mmol/L (ref 135–145)
Total Bilirubin: 0.9 mg/dL (ref 0.3–1.2)
Total Protein: 5 g/dL — ABNORMAL LOW (ref 6.5–8.1)

## 2019-07-07 LAB — CBC
HCT: 45.5 % (ref 36.0–46.0)
Hemoglobin: 14.9 g/dL (ref 12.0–15.0)
MCH: 28.4 pg (ref 26.0–34.0)
MCHC: 32.7 g/dL (ref 30.0–36.0)
MCV: 86.8 fL (ref 80.0–100.0)
Platelets: 220 10*3/uL (ref 150–400)
RBC: 5.24 MIL/uL — ABNORMAL HIGH (ref 3.87–5.11)
RDW: 15.9 % — ABNORMAL HIGH (ref 11.5–15.5)
WBC: 3.6 10*3/uL — ABNORMAL LOW (ref 4.0–10.5)
nRBC: 0 % (ref 0.0–0.2)

## 2019-07-07 LAB — GLUCOSE, CAPILLARY
Glucose-Capillary: 108 mg/dL — ABNORMAL HIGH (ref 70–99)
Glucose-Capillary: 133 mg/dL — ABNORMAL HIGH (ref 70–99)
Glucose-Capillary: 145 mg/dL — ABNORMAL HIGH (ref 70–99)
Glucose-Capillary: 133 mg/dL — ABNORMAL HIGH (ref 70–99)

## 2019-07-07 LAB — PROTIME-INR
INR: 3.1 — ABNORMAL HIGH (ref 0.8–1.2)
Prothrombin Time: 31.9 seconds — ABNORMAL HIGH (ref 11.4–15.2)

## 2019-07-07 MED ORDER — AMLODIPINE BESYLATE 10 MG PO TABS
10.0000 mg | ORAL_TABLET | Freq: Every day | ORAL | Status: DC
  Administered 2019-07-08 – 2019-07-13 (×5): 10 mg via ORAL
  Filled 2019-07-07 (×5): qty 1

## 2019-07-07 MED ORDER — SODIUM CHLORIDE 0.9 % IV SOLN: INTRAVENOUS | Status: DC

## 2019-07-07 MED ORDER — SODIUM BICARBONATE 650 MG PO TABS
650.0000 mg | ORAL_TABLET | Freq: Two times a day (BID) | ORAL | Status: DC
  Administered 2019-07-07 – 2019-07-13 (×13): 650 mg via ORAL
  Filled 2019-07-07 (×14): qty 1

## 2019-07-07 MED ORDER — WARFARIN 0.5 MG HALF TABLET
0.5000 mg | ORAL_TABLET | Freq: Once | ORAL | Status: AC
  Administered 2019-07-07: 0.5 mg via ORAL
  Filled 2019-07-07: qty 1

## 2019-07-07 NOTE — Progress Notes (Signed)
ANTICOAGULATION CONSULT NOTE - Initial Consult  Pharmacy Consult for warfarin Indication: atrial fibrillation (CHADS2VASc = 7)  No Known Allergies  Patient Measurements: Height: 5\' 2"  (157.5 cm) Weight: 149 lb 14.6 oz (68 kg) IBW/kg (Calculated) : 50.1   Vital Signs: Temp: 97.5 F (36.4 C) (01/16 0600) Temp Source: Axillary (01/16 0600) BP: 153/77 (01/16 0600) Pulse Rate: 74 (01/16 0600)  Labs: Recent Labs    07/05/19 0614 07/05/19 0614 07/06/19 0149 07/07/19 0504  HGB 15.6*   < > 15.0 14.9  HCT 48.5*  --  47.0* 45.5  PLT 233  --  203 220  LABPROT 32.0*  --  31.9* 31.9*  INR 3.1*  --  3.1* 3.1*  CREATININE 3.58*  --  4.04* 4.28*  TROPONINIHS 35*  --   --   --    < > = values in this interval not displayed.    Estimated Creatinine Clearance: 8.2 mL/min (A) (by C-G formula based on SCr of 4.28 mg/dL (H)).   Assessment: 86 yo W on warfarin for afib (CHADS2VASc = 7). Followed by Dr. Claris Gower [facility], [phone], who's staff said patient was last seen 06/11/19 and instructed to take 6mg -3mg -3mg -3mg  with follow up on 06/25/19, which the patient missed. Per patient, she's taking warfarin 6mg -6mg -3mg -3mg -3mg -6mg . Unclear from both sources which days of the week are 3mg  vs 6mg . Confirmed she took warfarin on 07/03/19 before admission. D-dimer < 5.   INR 3.1 today. Poor PO at least 3 days before admission, eating 0% yesterday. Will continue small dose to prevent drop.  CBC wnl.   PTA Warfarin: 3-6mg , unclear exact dose but likely 3mg  for 3-4 days/wk and 6mg  all other days.  Goal of Therapy:  INR 2-3 Monitor platelets by anticoagulation protocol: Yes   Plan:  Warfarin 0.5mg  x1  Daily INR, CBC, monitor for bleeding   Benetta Spar, PharmD, BCPS, BCCP Clinical Pharmacist  Please check AMION for all [place] phone numbers After 10:00 PM, call [place] [phone]

## 2019-07-07 NOTE — Progress Notes (Signed)
Subjective:  Did not physically see today.  Chart does not indicate any major events.  Labs from this AM are pending.  At least 350 of UOP   Objective Vital signs in last 24 hours: Vitals:   07/06/19 2045 07/06/19 2145 07/07/19 0531 07/07/19 0600  BP: (!) 154/81   (!) 153/77  Pulse:  65 (!) 56 74  Resp: 15 15 15 16   Temp:    (!) 97.5 F (36.4 C)  TempSrc:    Axillary  SpO2: 93% 92% 95% 94%  Weight:   68 kg   Height:       Weight change: 0.5 kg  Intake/Output Summary (Last 24 hours) at 07/07/2019 1124 Last data filed at 07/07/2019 0255 Gross per 24 hour  Intake 1135.69 ml  Output 350 ml  Net 785.69 ml    Assessment/Plan: 86 year old BF with baseline medical issues including CKD (crt 2 at baseline) who presents with COVID and A on CRF 1.Renal- A on CRF in the setting of COVID.  I cannot detect any nephrotoxins and there have been no significant hemodynamic derangements other than brief Afib with RVR.  She is making some urine.  U/A and ultrasound are fairly unrevealing.  There are no indications for HD and I am not sure I would offer it due to her age- possibly short term only.  Agree with management to date-  Hydration and supportive treatment of COVID.  I do not have any particular suggestions.  Will cont to follow renal function and UOP 2. Hypertension/volume  - BP is not low but her hgb is high suggesting volume depletion and also her exam does not look like volume overload.  Agree with IVF 3.  Metabolic acidosis and borderline hyperkalemia-  No action for now-  Is due to #1 4. Anemia  - hgb 15-  Argues for volume depletion  Will follow up on labs once resulted    Louis Meckel    Labs: Basic Metabolic Panel: Recent Labs  Lab 07/05/19 0017 07/05/19 0614 07/06/19 0149  NA 138 138 138  K 4.5 4.8 5.0  CL 101 98 102  CO2 22 22 21*  GLUCOSE 148* 183* 131*  BUN 57* 63* 74*  CREATININE 3.42* 3.58* 4.04*  CALCIUM 8.2* 8.1* 8.1*   Liver Function Tests: Recent  Labs  Lab 07/05/19 0017 07/05/19 0614 07/06/19 0149  AST 27 24 31   ALT 15 15 17   ALKPHOS 65 61 57  BILITOT 0.9 1.2 0.8  PROT 6.7 6.0* 5.7*  ALBUMIN 2.3* 2.1* 2.0*   Recent Labs  Lab 07/05/19 0017  LIPASE 25   No results for input(s): AMMONIA in the last 168 hours. CBC: Recent Labs  Lab 07/05/19 0017 07/05/19 0017 07/05/19 0614 07/06/19 0149 07/07/19 0504  WBC 4.7   < > 4.4 3.6* 3.6*  NEUTROABS  --   --  3.4  --   --   HGB 15.6*   < > 15.6* 15.0 14.9  HCT 48.4*   < > 48.5* 47.0* 45.5  MCV 88.5  --  86.8 87.4 86.8  PLT 233   < > 233 203 220   < > = values in this interval not displayed.   Cardiac Enzymes: No results for input(s): CKTOTAL, CKMB, CKMBINDEX, TROPONINI in the last 168 hours. CBG: Recent Labs  Lab 07/06/19 0817 07/06/19 1214 07/06/19 1643 07/06/19 2049 07/07/19 0800  GLUCAP 136* 99 120* 123* 108*    Iron Studies:  Recent Labs  07/05/19 0614  FERRITIN 91   Studies/Results: US Renal  Result Date: 07/06/2019 CLINICAL DATA:  Acute renal injury. EXAM: RENAL / URINARY TRACT ULTRASOUND COMPLETE COMPARISON:  No prior. FINDINGS: Right Kidney: Renal measurements: 9.8 x 4.5 x 3.9 cm = volume: 91.9 mL. Increased echogenicity. No mass or hydronephrosis visualized. Left Kidney: Renal measurements: 10.0 x 5.3 x 5.7 cm = volume: 158.7 mL. Increased echogenicity. No mass or hydronephrosis visualized. Bladder: Appears normal for degree of bladder distention. Other: None. IMPRESSION: 1. Increased echogenicity both kidneys consistent with chronic medical renal disease. 2.  No acute abnormality.  No hydronephrosis or bladder distention. Electronically Signed   By: Marcello Moores  Register   On: 07/06/2019 07:58   Medications: Infusions: . remdesivir 100 mg in NS 100 mL 100 mg (07/07/19 1042)    Scheduled Medications: . amLODipine  5 mg Oral Daily  . digoxin  0.0625 mg Oral QODAY  . feeding supplement (ENSURE ENLIVE)  237 mL Oral BID BM  . insulin aspart  0-9 Units  Subcutaneous TID WC  . latanoprost  1 drop Both Eyes QHS  . metoprolol succinate  100 mg Oral Daily  . pravastatin  40 mg Oral q1800  . Warfarin - Pharmacist Dosing Inpatient   Does not apply q1800    have reviewed scheduled and prn medications.  Physical Exam: Did not see and examine today due to COVID restrictions  07/07/2019,11:24 AM  LOS: 2 days

## 2019-07-07 NOTE — Progress Notes (Signed)
PROGRESS NOTE   Patricia Randall  [id number]    DOB: 08/06/1928    DOA: 07/04/2019  PCP: Leonard Downing, MD   I have briefly reviewed patients previous medical records in [facility].  Chief Complaint:   Chief Complaint  Patient presents with  . Failure To Thrive    Brief Narrative:  86 year old female patient with PMH of atrial fibrillation on Warfarin, chronic systolic CHF, LVEF [id number] by TTE 09/27/2017, hyperlipidemia, hypertension, type II DM, stage IV CKD (baseline creatinine ~1.8), presented to the ER due to increasing fatigue, weakness, poor appetite, decreased oral intake, decreased mobility and laying in the bed all the time for the last 3 days.  Transiently in A. fib with RVR in the ED, improved after IV fluids.  Admitted for acute on chronic kidney disease due to COVID-19 infection with associated poor oral intake and dehydration.  Despite IV hydration, acute kidney injury worsened, creatinine >4 and nephrology consulted on 1/15, no new interventions other than continuing IV fluids.  Creatinine continues to uptrend/4.28 today.   Assessment & Plan:  Principal Problem:   COVID-19 virus infection Active Problems:   Permanent atrial fibrillation (HCC)   Cardiomyopathy ([place])   DM (diabetes mellitus), type 2 with renal complications (HCC)   AKI (acute kidney injury) ([place])   Acute kidney injury complicating CKD stage IV/metabolic acidosis  Most likely due to COVID-19 infection related to poor oral intake and continued Lasix use.  Held Lasix temporarily.  Briefly hydrated with IV fluids.  Encouraged oral intake.  Baseline creatinine ~1.8, presented with creatinine of 3.42.  Trend daily BMP.  RD evaluated and plans to liberalize diet to regular with nutritional supplements to encourage oral intake.  Despite increasing IV fluids, creatinine increased to >4 and nephrology consulted on 1/15.  Renal ultrasound with chronic medical disease and no hydronephrosis.  As per nephrology,  no exposure to nephrotoxins, no evidence of hemodynamic derangements, recommended continuing IV hydration.  Despite continued IV fluids, creatinine has gone up from 4.04-4.28 but may be plateauing of i.e. not that rapid of a rise.  Continue IV fluids and follow BMP in a.m.  Added oral sodium bicarbonate tablets.  Not a candidate for long-term HD.  Also patient not keen on HD.  COVID-19 infection  Continue IV remdesivir.  Decadron not started since patient was not hypoxic.  Chest x-ray showed no acute abnormalities.  A. fib with RVR  Transient RVR in the ED which improved after IV fluid hydration.  Continue Toprol-XL 100 mg daily.  Digoxin level normal early on in admission.  Continue Coumadin per pharmacy/INR therapeutic 3.1.  Digoxin discontinued due to worsening renal insufficiency.  Had mild pauses up to 2.17 seconds, continue to monitor on telemetry.  Chronic systolic CHF/cardiomyopathy  TTE 09/27/2017: LVEF 30-25%.  Clinically dehydrated on admission.  Hold Lasix.  Continue gentle IV fluids and resume Lasix when able, possibly at discharge.  Does not appear volume overloaded but needs to be monitored closely while on continued IV fluids.  Essential hypertension  Continue Toprol-XL 100 mg daily.  Uncontrolled despite this, started amlodipine 5 mg daily and titrate up as needed.  As needed hydralazine.  Mildly uncontrolled.  Consider increasing amlodipine to 10 mg daily tomorrow  Hyperlipidemia  Continue statins.  Type II DM with renal complications  Hyperglycemia now worsened by steroids.  Managed with SSI and adjust insulins as needed.  Reasonable inpatient control.  Body mass index is 27.42 kg/m.  Nutritional Status Nutrition Problem: Increased nutrient  needs Etiology: acute illness, catabolic [id number]) Signs/Symptoms: estimated needs Interventions: Ensure Enlive (each supplement provides 350kcal and 20 grams of protein), Liberalize Diet  DVT prophylaxis: Anticoagulated  on warfarin. Code Status: Full Family Communication: None at bedside. I discussed with patient's son via phone on 1/14, updated care and answered questions. Disposition: Patient admitted from home.  Plan will be to discharge her home pending clinical improvement and therapies evaluation.  Not medically ready for discharge due to progressive acute kidney injury.   Consultants:   None  Procedures:   None  Antimicrobials:   None   Subjective:  Reports ongoing poor appetite.  However denies lack of taste or lack of smell.  No chest pain or dyspnea reported.  Objective:   Vitals:   07/06/19 2145 07/07/19 0531 07/07/19 0600 07/07/19 1413  BP:   (!) 153/77 (!) 170/106  Pulse: 65 (!) 56 74 60  Resp: 15 15 16    Temp:   (!) 97.5 F (36.4 C) 97.7 F (36.5 C)  TempSrc:   Axillary Oral  SpO2: 92% 95% 94% 98%  Weight:  68 kg    Height:        General exam: Pleasant elderly female, moderately built and frail sitting up comfortably in bed.  Oral mucosa moist.  No thrush. Respiratory system: Clear to auscultation.  No increased work of breathing Cardiovascular system: S1 and S2 heard, irregularly irregular.  No JVD, murmurs or pedal edema.  Telemetry personally reviewed: A. fib with controlled ventricular rate and occasional pauses longest of which was 2.17 seconds. Gastrointestinal system: Abdomen is nondistended, soft and nontender. No organomegaly or masses felt. Normal bowel sounds heard. Central nervous system: Alert and oriented to self and place. No focal neurological deficits. Extremities: Symmetric 5 x 5 power. Skin: No rashes, lesions or ulcers Psychiatry: Judgement and insight appear somewhat impaired. Mood & affect pleasant and appropriate.     Data Reviewed:   I have personally reviewed following labs and imaging studies   CBC: Recent Labs  Lab 07/05/19 0614 07/06/19 0149 07/07/19 0504  WBC 4.4 3.6* 3.6*  NEUTROABS 3.4  --   --   HGB 15.6* 15.0 14.9  HCT 48.5*  47.0* 45.5  MCV 86.8 87.4 86.8  PLT 233 203 [id number]    Basic Metabolic Panel: Recent Labs  Lab 07/05/19 0614 07/06/19 0149 07/07/19 0504  NA 138 138 140  K 4.8 5.0 5.0  CL 98 102 107  CO2 22 21* 15*  GLUCOSE 183* 131* 116*  BUN 63* 74* 86*  CREATININE 3.58* 4.04* 4.28*  CALCIUM 8.1* 8.1* 7.6*  MG 1.8  --   --     Liver Function Tests: Recent Labs  Lab 07/05/19 0614 07/06/19 0149 07/07/19 0504  AST 24 31 26   ALT 15 17 14   ALKPHOS 61 57 57  BILITOT 1.2 0.8 0.9  PROT 6.0* 5.7* 5.0*  ALBUMIN 2.1* 2.0* 1.7*    CBG: Recent Labs  Lab 07/07/19 0800 07/07/19 1150 07/07/19 1628  GLUCAP 108* 133* 145*    Microbiology Studies:  No results found for this or any previous visit (from the past 240 hour(s)).   Radiology Studies:  No results found.   Scheduled Meds:   . amLODipine  5 mg Oral Daily  . feeding supplement (ENSURE ENLIVE)  237 mL Oral BID BM  . insulin aspart  0-9 Units Subcutaneous TID WC  . latanoprost  1 drop Both Eyes QHS  . metoprolol succinate  100 mg Oral Daily  .  pravastatin  40 mg Oral q1800  . sodium bicarbonate  650 mg Oral BID  . warfarin  0.5 mg Oral ONCE-1800  . Warfarin - Pharmacist Dosing Inpatient   Does not apply q1800    Continuous Infusions:   . remdesivir 100 mg in NS 100 mL 100 mg (07/07/19 1042)     LOS: 2 days     Vernell Leep, MD, [place], [facility]. [facility]    To contact the attending provider between 7A-7P or the covering provider during after hours 7P-7A, please log into the web site [email or web address] and access using universal [place] password for that web site. If you do not have the password, please call the hospital operator.  07/07/2019, 5:44 PM

## 2019-07-08 DIAGNOSIS — E872 Acidosis: Secondary | ICD-10-CM

## 2019-07-08 DIAGNOSIS — N189 Chronic kidney disease, unspecified: Secondary | ICD-10-CM

## 2019-07-08 LAB — GLUCOSE, CAPILLARY
Glucose-Capillary: 148 mg/dL — ABNORMAL HIGH (ref 70–99)
Glucose-Capillary: 161 mg/dL — ABNORMAL HIGH (ref 70–99)
Glucose-Capillary: 152 mg/dL — ABNORMAL HIGH (ref 70–99)
Glucose-Capillary: 159 mg/dL — ABNORMAL HIGH (ref 70–99)
Glucose-Capillary: 185 mg/dL — ABNORMAL HIGH (ref 70–99)

## 2019-07-08 LAB — BASIC METABOLIC PANEL
Anion gap: 16 — ABNORMAL HIGH (ref 5–15)
BUN: 98 mg/dL — ABNORMAL HIGH (ref 8–23)
CO2: 19 mmol/L — ABNORMAL LOW (ref 22–32)
Calcium: 7.6 mg/dL — ABNORMAL LOW (ref 8.9–10.3)
Chloride: 108 mmol/L (ref 98–111)
Creatinine, Ser: 5.02 mg/dL — ABNORMAL HIGH (ref 0.44–1.00)
GFR calc Af Amer: 8 mL/min — ABNORMAL LOW (ref >60)
GFR calc non Af Amer: 7 mL/min — ABNORMAL LOW (ref >60)
Glucose, Bld: 157 mg/dL — ABNORMAL HIGH (ref 70–99)
Potassium: 4.7 mmol/L (ref 3.5–5.1)
Sodium: 143 mmol/L (ref 135–145)

## 2019-07-08 LAB — CBC
HCT: 47.5 % — ABNORMAL HIGH (ref 36.0–46.0)
Hemoglobin: 15.5 g/dL — ABNORMAL HIGH (ref 12.0–15.0)
MCH: 28.1 pg (ref 26.0–34.0)
MCHC: 32.6 g/dL (ref 30.0–36.0)
MCV: 86.1 fL (ref 80.0–100.0)
Platelets: 258 10*3/uL (ref 150–400)
RBC: 5.52 MIL/uL — ABNORMAL HIGH (ref 3.87–5.11)
RDW: 15.9 % — ABNORMAL HIGH (ref 11.5–15.5)
WBC: 4.5 10*3/uL (ref 4.0–10.5)
nRBC: 0 % (ref 0.0–0.2)

## 2019-07-08 LAB — URINE CULTURE

## 2019-07-08 LAB — PROTIME-INR
INR: 3.9 — ABNORMAL HIGH (ref 0.8–1.2)
Prothrombin Time: 38.2 seconds — ABNORMAL HIGH (ref 11.4–15.2)

## 2019-07-08 MED ORDER — FUROSEMIDE 10 MG/ML IJ SOLN
40.0000 mg | Freq: Once | INTRAMUSCULAR | Status: AC
  Administered 2019-07-08: 40 mg via INTRAVENOUS
  Filled 2019-07-08: qty 4

## 2019-07-08 NOTE — Progress Notes (Signed)
PROGRESS NOTE   Patricia Randall  [id number]    DOB: 1928-09-20    DOA: 07/04/2019  PCP: Leonard Downing, MD   I have briefly reviewed patients previous medical records in [facility].  Chief Complaint:   Chief Complaint  Patient presents with  . Failure To Thrive    Brief Narrative:  86 year old female patient with PMH of atrial fibrillation on Warfarin, chronic systolic CHF, LVEF [id number] by TTE 09/27/2017, hyperlipidemia, hypertension, type II DM, stage IV CKD (baseline creatinine ~1.8), presented to the ER due to increasing fatigue, weakness, poor appetite, decreased oral intake, decreased mobility and laying in the bed all the time for the last 3 days.  Transiently in A. fib with RVR in the ED, improved after IV fluids.  Admitted for acute on chronic kidney disease due to COVID-19 infection with associated poor oral intake and dehydration.  Despite IV hydration, acute kidney injury continued to worsen, appearing somewhat volume overloaded, trial of a dose of IV Lasix 40 mg x 1, monitor daily BMP and patient/family open for short-term HD but patient not a long-term HD candidate.   Assessment & Plan:  Principal Problem:   COVID-19 virus infection Active Problems:   Permanent atrial fibrillation (HCC)   Cardiomyopathy ([place])   DM (diabetes mellitus), type 2 with renal complications (HCC)   AKI (acute kidney injury) ([place])   Acute kidney injury complicating CKD stage IV/metabolic acidosis  Most likely due to COVID-19 infection related to poor oral intake and continued Lasix use.  Held Lasix temporarily. Baseline creatinine ~1.8, presented with creatinine of 3.42. Despite increasing IV fluids, creatinine continued to worsen and nephrology was consulted.  Renal ultrasound with chronic medical disease and no hydronephrosis.  As per nephrology, no exposure to nephrotoxins, no evidence of hemodynamic derangements.  Added oral sodium bicarbonate tablets. Despite IV hydration, acute kidney  injury continued to worsen (creatinine up to 5.02), appearing somewhat volume overloaded, discontinued IV fluids, trial of a dose of IV Lasix 40 mg x 1, monitor daily BMP and patient/family open for short-term HD (as discussed by me and Dr. Moshe Cipro) but patient not a long-term HD candidate.  Her poor oral intake may now be due to uremia.  No urgent indication for HD.  COVID-19 infection  Continue IV remdesivir.  Decadron not started since patient was not hypoxic.  Chest x-ray showed no acute abnormalities.  A. fib with RVR  Transient RVR in the ED which improved after IV fluid hydration.  Continue Toprol-XL 100 mg daily.  Digoxin level normal early on in admission.  Continue Coumadin per pharmacy/INR supra therapeutic 3.9, may have to hold Coumadin temporarily.  Digoxin discontinued due to worsening renal insufficiency.  No significant pauses i.e. none >2.5 seconds  Chronic systolic CHF/cardiomyopathy  TTE 09/27/2017: LVEF 30-25%.  Clinically dehydrated on admission.  Held Lasix.  Appears to be slightly volume overloaded with trace edema of upper extremities.  IV Lasix 40 mg x 1 dose.  Essential hypertension  Continue Toprol-XL 100 mg daily.  Added amlodipine 10 mg daily.  As needed oral hydralazine.  Mildly uncontrolled.  Monitor.  Hyperlipidemia  Continue statins.  Type II DM with renal complications  Hyperglycemia now worsened by steroids.  Managed with SSI and adjust insulins as needed.  Reasonable inpatient control.  Body mass index is 27.5 kg/m.  Nutritional Status Nutrition Problem: Increased nutrient needs Etiology: acute illness, catabolic [id number]) Signs/Symptoms: estimated needs Interventions: Ensure Enlive (each supplement provides 350kcal and 20 grams of protein), Liberalize  Diet  DVT prophylaxis: Anticoagulated on warfarin. Code Status: Full Family Communication: None at bedside. I discussed with patient's son via phone on 1/14, updated care and answered  questions. Disposition: Patient admitted from home.  Plan will be to discharge her home pending clinical improvement and therapies evaluation.  Not medically ready for discharge due to progressive acute kidney injury.   Consultants:   None  Procedures:   None  Antimicrobials:   None   Subjective:  Barely eating anything at all.  Drinking water.  Decreased urine output all day yesterday per RN but had 650 mL urine output today.  Refused to be out of bed yesterday.  Denies dyspnea.  Not keen on HD but willing to undergo for short-term purposes.  Objective:   Vitals:   07/07/19 0600 07/07/19 1413 07/07/19 2054 07/08/19 0437  BP: (!) 153/77 (!) 170/106 (!) 156/89 (!) 147/85  Pulse: 74 60 65 65  Resp: 16  13 18   Temp: (!) 97.5 F (36.4 C) 97.7 F (36.5 C) 97.8 F (36.6 C) 97.9 F (36.6 C)  TempSrc: Axillary Oral Oral Oral  SpO2: 94% 98% 98% 94%  Weight:    68.2 kg  Height:        General exam: Pleasant elderly female, moderately built and frail sitting up comfortably in reclining chair.  Oral mucosa moist. Respiratory system: Clear to auscultation.  No increased work of breathing. Cardiovascular system: S1 and S2 heard, irregularly irregular.  No JVD, murmurs or pedal edema.  Trace bilateral upper extremity edema.  Telemetry personally reviewed: A. fib with controlled ventricular rate.  A 2.10-second pause noted. Gastrointestinal system: Abdomen is nondistended, soft and nontender. No organomegaly or masses felt. Normal bowel sounds heard. Central nervous system: Alert and oriented to self and place. No focal neurological deficits. Extremities: Symmetric 5 x 5 power. Skin: No rashes, lesions or ulcers Psychiatry: Judgement and insight appear somewhat impaired. Mood & affect flat    Data Reviewed:   I have personally reviewed following labs and imaging studies   CBC: Recent Labs  Lab 07/05/19 0614 07/05/19 0614 07/06/19 0149 07/07/19 0504 07/08/19 0532  WBC 4.4   <  > 3.6* 3.6* 4.5  NEUTROABS 3.4  --   --   --   --   HGB 15.6*   < > 15.0 14.9 15.5*  HCT 48.5*   < > 47.0* 45.5 47.5*  MCV 86.8   < > 87.4 86.8 86.1  PLT 233   < > 203 220 258   < > = values in this interval not displayed.    Basic Metabolic Panel: Recent Labs  Lab 07/05/19 0614 07/05/19 0614 07/06/19 0149 07/07/19 0504 07/08/19 0532  NA 138   < > 138 140 143  K 4.8   < > 5.0 5.0 4.7  CL 98   < > 102 107 108  CO2 22   < > 21* 15* 19*  GLUCOSE 183*   < > 131* 116* 157*  BUN 63*   < > 74* 86* 98*  CREATININE 3.58*   < > 4.04* 4.28* 5.02*  CALCIUM 8.1*   < > 8.1* 7.6* 7.6*  MG 1.8  --   --   --   --    < > = values in this interval not displayed.    Liver Function Tests: Recent Labs  Lab 07/05/19 0614 07/06/19 0149 07/07/19 0504  AST 24 31 26   ALT 15 17 14   ALKPHOS 61 57 57  BILITOT 1.2 0.8 0.9  PROT 6.0* 5.7* 5.0*  ALBUMIN 2.1* 2.0* 1.7*    CBG: Recent Labs  Lab 07/07/19 2055 07/08/19 0820 07/08/19 1125  GLUCAP 133* 148* 161*    Microbiology Studies:   Recent Results (from the past 240 hour(s))  Urine culture     Status: Abnormal   Collection Time: 07/06/19  8:23 PM   Specimen: Urine, Clean Catch  Result Value Ref Range Status   Specimen Description URINE, CLEAN CATCH  Final   Special Requests   Final    NONE Performed at [facility], [place] [address] [place], [place] [address]    Culture MULTIPLE SPECIES PRESENT, SUGGEST RECOLLECTION (A)  Final   Report Status 07/08/2019 FINAL  Final     Radiology Studies:  No results found.   Scheduled Meds:   . amLODipine  10 mg Oral Daily  . feeding supplement (ENSURE ENLIVE)  237 mL Oral BID BM  . insulin aspart  0-9 Units Subcutaneous TID WC  . latanoprost  1 drop Both Eyes QHS  . metoprolol succinate  100 mg Oral Daily  . pravastatin  40 mg Oral q1800  . sodium bicarbonate  650 mg Oral BID  . Warfarin - Pharmacist Dosing Inpatient   Does not apply q1800    Continuous Infusions:   .  remdesivir 100 mg in NS 100 mL 100 mg (07/08/19 1005)     LOS: 3 days     Vernell Leep, MD, [place], [facility]. [facility]    To contact the attending provider between 7A-7P or the covering provider during after hours 7P-7A, please log into the web site [email or web address] and access using universal [place] password for that web site. If you do not have the password, please call the hospital operator.  07/08/2019, 12:43 PM

## 2019-07-08 NOTE — Progress Notes (Signed)
Subjective:  UOP has been marginal-  Patricia Randall does not still have an appetite-  BUN and crt worse   Objective Vital signs in last 24 hours: Vitals:   07/07/19 0600 07/07/19 1413 07/07/19 2054 07/08/19 0437  BP: (!) 153/77 (!) 170/106 (!) 156/89 (!) 147/85  Pulse: 74 60 65 65  Resp: 16  13 18   Temp: (!) 97.5 F (36.4 C) 97.7 F (36.5 C) 97.8 F (36.6 C) 97.9 F (36.6 C)  TempSrc: Axillary Oral Oral Oral  SpO2: 94% 98% 98% 94%  Weight:    68.2 kg  Height:       Weight change: 0.2 kg  Intake/Output Summary (Last 24 hours) at 07/08/2019 1103 Last data filed at 07/08/2019 0650 Gross per 24 hour  Intake 340 ml  Output 850 ml  Net -510 ml    Assessment/Plan: 86 year old BF with baseline medical issues including CKD (crt 2 at baseline) who presents with COVID and A on CRF 1.Renal- A on CRF in the setting of COVID.  I cannot detect any nephrotoxins and there have been no significant hemodynamic derangements other than brief Afib with RVR.  Patricia Randall is making some urine but not a lot-   possibly picking up.  U/A and ultrasound are fairly unrevealing.  There are no absolute indications for HD but numbers are worsening daily and Patricia Randall is not thriving.  We talked about this and I talked to her son as well who is a former dialysis patient s/p transplant.  We all agree that Patricia Randall would not be a good long term HD candidate however, if we feel some HD is needed in the short term to support her through this Patricia Randall and her son would be willing to consider.  Patricia Randall was pretty functional pre hosp so I think this is a reasonable approach .  Cont to follow UOP and labs-  hosp to give one dose of lasix today 2. Hypertension/volume  - BP is not low - hgb is high suggesting volume depletion - exam does not look like volume overload. IVF stopped with minimal UOP 3.  Metabolic acidosis and borderline hyperkalemia-  I put on oral bicarb- slightly better today  4. Anemia  - hgb 15-  Argues for volume depletion 5. COVID-  remdesivir    Louis Meckel    Labs: Basic Metabolic Panel: Recent Labs  Lab 07/06/19 0149 07/07/19 0504 07/08/19 0532  NA 138 140 143  K 5.0 5.0 4.7  CL 102 107 108  CO2 21* 15* 19*  GLUCOSE 131* 116* 157*  BUN 74* 86* 98*  CREATININE 4.04* 4.28* 5.02*  CALCIUM 8.1* 7.6* 7.6*   Liver Function Tests: Recent Labs  Lab 07/05/19 0614 07/06/19 0149 07/07/19 0504  AST 24 31 26   ALT 15 17 14   ALKPHOS 61 57 57  BILITOT 1.2 0.8 0.9  PROT 6.0* 5.7* 5.0*  ALBUMIN 2.1* 2.0* 1.7*   Recent Labs  Lab 07/05/19 0017  LIPASE 25   No results for input(s): AMMONIA in the last 168 hours. CBC: Recent Labs  Lab 07/05/19 0017 07/05/19 0017 07/05/19 [id number] 07/05/19 0614 07/06/19 0149 07/07/19 0504 07/08/19 0532  WBC 4.7   < > 4.4   < > 3.6* 3.6* 4.5  NEUTROABS  --   --  3.4  --   --   --   --   HGB 15.6*   < > 15.6*   < > 15.0 14.9 15.5*  HCT 48.4*   < >  48.5*   < > 47.0* 45.5 47.5*  MCV 88.5  --  86.8  --  87.4 86.8 86.1  PLT 233   < > 233   < > 203 220 258   < > = values in this interval not displayed.   Cardiac Enzymes: No results for input(s): CKTOTAL, CKMB, CKMBINDEX, TROPONINI in the last 168 hours. CBG: Recent Labs  Lab 07/07/19 0800 07/07/19 1150 07/07/19 1628 07/07/19 2055 07/08/19 0820  GLUCAP 108* 133* 145* 133* 148*    Iron Studies:  No results for input(s): IRON, TIBC, TRANSFERRIN, FERRITIN in the last 72 hours. Studies/Results: No results found. Medications: Infusions: . remdesivir 100 mg in NS 100 mL 100 mg (07/08/19 1005)    Scheduled Medications: . amLODipine  10 mg Oral Daily  . feeding supplement (ENSURE ENLIVE)  237 mL Oral BID BM  . insulin aspart  0-9 Units Subcutaneous TID WC  . latanoprost  1 drop Both Eyes QHS  . metoprolol succinate  100 mg Oral Daily  . pravastatin  40 mg Oral q1800  . sodium bicarbonate  650 mg Oral BID  . Warfarin - Pharmacist Dosing Inpatient   Does not apply q1800    have reviewed scheduled and  prn medications.  Physical Exam: Gen-  Sitting up in bedside chair.  Is distressed that Patricia Randall is so sick and that Patricia Randall may need dialysis HEENT-  PERRLA Lungs- CBS bilat CV-  RRR abd-  Soft, non tender Ext- min edema   07/08/2019,11:03 AM  LOS: 3 days

## 2019-07-08 NOTE — Progress Notes (Signed)
ANTICOAGULATION CONSULT NOTE - Initial Consult  Pharmacy Consult for warfarin Indication: atrial fibrillation (CHADS2VASc = 7)  No Known Allergies  Patient Measurements: Height: 5\' 2"  (157.5 cm) Weight: 150 lb 5.7 oz (68.2 kg) IBW/kg (Calculated) : 50.1   Vital Signs: Temp: 97.9 F (36.6 C) (01/17 0437) Temp Source: Oral (01/17 0437) BP: 147/85 (01/17 0437) Pulse Rate: 65 (01/17 0437)  Labs: Recent Labs    07/06/19 0149 07/06/19 0149 07/07/19 0504 07/08/19 0532  HGB 15.0   < > 14.9 15.5*  HCT 47.0*  --  45.5 47.5*  PLT 203  --  220 258  LABPROT 31.9*  --  31.9* 38.2*  INR 3.1*  --  3.1* 3.9*  CREATININE 4.04*  --  4.28* 5.02*   < > = values in this interval not displayed.    Estimated Creatinine Clearance: 7 mL/min (A) (by C-G formula based on SCr of 5.02 mg/dL (H)).   Assessment: 86 yo W on warfarin for afib (CHADS2VASc = 7). Followed by Dr. Claris Gower [facility], [phone], who's staff said patient was last seen 06/11/19 and instructed to take 6mg -3mg -3mg -3mg  with follow up on 06/25/19, which the patient missed. Per patient, she's taking warfarin 6mg -6mg -3mg -3mg -3mg -6mg . Unclear from both sources which days of the week are 3mg  vs 6mg . Confirmed she took warfarin on 07/03/19 before admission. D-dimer < 5.   INR 3.9 today. Poor PO at least 3 days before admission, eating 0% yesterday. CBC stable (Hbg 15.5, plt 258). No concerns for bleeding per RN.   PTA Warfarin: 3-6mg , unclear exact dose but likely 3mg  for 3-4 days/wk and 6mg  all other days.  Goal of Therapy:  INR 2-3 Monitor platelets by anticoagulation protocol: Yes   Plan:  Hold Warfarin Daily INR, CBC, monitor for bleeding   Acey Lav, PharmD  PGY1 Acute Care Pharmacy Resident  Please check AMION for all [place] phone numbers After 10:00 PM, call [place] [phone]

## 2019-07-09 LAB — BASIC METABOLIC PANEL
Anion gap: 17 — ABNORMAL HIGH (ref 5–15)
BUN: 121 mg/dL — ABNORMAL HIGH (ref 8–23)
CO2: 16 mmol/L — ABNORMAL LOW (ref 22–32)
Calcium: 7.4 mg/dL — ABNORMAL LOW (ref 8.9–10.3)
Chloride: 107 mmol/L (ref 98–111)
Creatinine, Ser: 6.81 mg/dL — ABNORMAL HIGH (ref 0.44–1.00)
GFR calc Af Amer: 6 mL/min — ABNORMAL LOW (ref >60)
GFR calc non Af Amer: 5 mL/min — ABNORMAL LOW (ref >60)
Glucose, Bld: 156 mg/dL — ABNORMAL HIGH (ref 70–99)
Potassium: 4.6 mmol/L (ref 3.5–5.1)
Sodium: 140 mmol/L (ref 135–145)

## 2019-07-09 LAB — GLUCOSE, CAPILLARY
Glucose-Capillary: 165 mg/dL — ABNORMAL HIGH (ref 70–99)
Glucose-Capillary: 163 mg/dL — ABNORMAL HIGH (ref 70–99)
Glucose-Capillary: 129 mg/dL — ABNORMAL HIGH (ref 70–99)
Glucose-Capillary: 116 mg/dL — ABNORMAL HIGH (ref 70–99)

## 2019-07-09 LAB — COMPREHENSIVE METABOLIC PANEL
ALT: 13 U/L (ref 0–44)
AST: 29 U/L (ref 15–41)
Albumin: 1.7 g/dL — ABNORMAL LOW (ref 3.5–5.0)
Alkaline Phosphatase: 62 U/L (ref 38–126)
Anion gap: 18 — ABNORMAL HIGH (ref 5–15)
BUN: 114 mg/dL — ABNORMAL HIGH (ref 8–23)
CO2: 14 mmol/L — ABNORMAL LOW (ref 22–32)
Calcium: 7.5 mg/dL — ABNORMAL LOW (ref 8.9–10.3)
Chloride: 109 mmol/L (ref 98–111)
Creatinine, Ser: 6.22 mg/dL — ABNORMAL HIGH (ref 0.44–1.00)
GFR calc Af Amer: 6 mL/min — ABNORMAL LOW (ref >60)
GFR calc non Af Amer: 6 mL/min — ABNORMAL LOW (ref >60)
Glucose, Bld: 206 mg/dL — ABNORMAL HIGH (ref 70–99)
Potassium: 6.1 mmol/L — ABNORMAL HIGH (ref 3.5–5.1)
Sodium: 141 mmol/L (ref 135–145)
Total Bilirubin: 1.2 mg/dL (ref 0.3–1.2)
Total Protein: 5.5 g/dL — ABNORMAL LOW (ref 6.5–8.1)

## 2019-07-09 LAB — CBC
HCT: 49.2 % — ABNORMAL HIGH (ref 36.0–46.0)
Hemoglobin: 16.2 g/dL — ABNORMAL HIGH (ref 12.0–15.0)
MCH: 28.2 pg (ref 26.0–34.0)
MCHC: 32.9 g/dL (ref 30.0–36.0)
MCV: 85.6 fL (ref 80.0–100.0)
Platelets: 289 10*3/uL (ref 150–400)
RBC: 5.75 MIL/uL — ABNORMAL HIGH (ref 3.87–5.11)
RDW: 16.8 % — ABNORMAL HIGH (ref 11.5–15.5)
WBC: 5.5 10*3/uL (ref 4.0–10.5)
nRBC: 0 % (ref 0.0–0.2)

## 2019-07-09 LAB — PROTIME-INR
INR: 4.5 (ref 0.8–1.2)
Prothrombin Time: 42.5 seconds — ABNORMAL HIGH (ref 11.4–15.2)

## 2019-07-09 LAB — PHOSPHORUS: Phosphorus: 8.4 mg/dL — ABNORMAL HIGH (ref 2.5–4.6)

## 2019-07-09 MED ORDER — SODIUM ZIRCONIUM CYCLOSILICATE 10 G PO PACK
10.0000 g | PACK | Freq: Once | ORAL | Status: AC
  Administered 2019-07-09: 10 g via ORAL
  Filled 2019-07-09: qty 1

## 2019-07-09 MED ORDER — SODIUM ZIRCONIUM CYCLOSILICATE 10 G PO PACK
10.0000 g | PACK | Freq: Two times a day (BID) | ORAL | Status: DC
  Administered 2019-07-09 – 2019-07-10 (×4): 10 g via ORAL
  Filled 2019-07-09 (×4): qty 1

## 2019-07-09 MED ORDER — CHLORHEXIDINE GLUCONATE CLOTH 2 % EX PADS
6.0000 | MEDICATED_PAD | Freq: Every day | CUTANEOUS | Status: DC
  Administered 2019-07-10 – 2019-07-17 (×7): 6 via TOPICAL

## 2019-07-09 MED ORDER — METOPROLOL SUCCINATE ER 50 MG PO TB24
50.0000 mg | ORAL_TABLET | Freq: Every day | ORAL | Status: DC
  Administered 2019-07-10 – 2019-07-13 (×3): 50 mg via ORAL
  Filled 2019-07-09 (×3): qty 1

## 2019-07-09 NOTE — Progress Notes (Signed)
PROGRESS NOTE   Patricia Randall  [id number]    DOB: 08-Nov-1928    DOA: 07/04/2019  PCP: Leonard Downing, MD   I have briefly reviewed patients previous medical records in [facility].  Chief Complaint:   Chief Complaint  Patient presents with  . Failure To Thrive    Brief Narrative:  86 year old female patient with PMH of atrial fibrillation on Warfarin, chronic systolic CHF, LVEF [id number] by TTE 09/27/2017, hyperlipidemia, hypertension, type II DM, stage IV CKD (baseline creatinine ~1.8), presented to the ER due to increasing fatigue, weakness, poor appetite, decreased oral intake, decreased mobility and laying in the bed all the time for the last 3 days.  Transiently in A. fib with RVR in the ED, improved after IV fluids.  Admitted for acute on chronic kidney disease due to COVID-19 infection with associated poor oral intake and dehydration.  Despite IV hydration, acute kidney injury continues to worsen, hyperkalemia, metabolic acidosis, suspect some uremia and volume overloaded. Patient/family open for short-term HD as per Nephrologist discussion with them on the weekend but patient not a long-term HD candidate.   Assessment & Plan:  Principal Problem:   COVID-19 virus infection Active Problems:   Permanent atrial fibrillation (HCC)   Cardiomyopathy ([place])   DM (diabetes mellitus), type 2 with renal complications (HCC)   AKI (acute kidney injury) ([place])   Acute kidney injury complicating CKD stage IV/metabolic acidosis/hyperkalemia/uremia  Most likely due to COVID-19 infection related to poor oral intake and continued Lasix use.  Held Lasix temporarily. Baseline creatinine ~1.8, presented with creatinine of 3.42. Renal ultrasound with chronic medical disease and no hydronephrosis.  As per nephrology, no exposure to nephrotoxins, no evidence of hemodynamic derangements.  Added oral sodium bicarbonate tablets. Despite IV hydration, acute kidney injury continues to worsen  (creatinine up to 6.22, hyperkalemia (6.1)-without EKG changes, metabolic acidosis (bicarb 14), suspect some uremia and volume overloaded.  No change despite a dose of IV Lasix 40 mg yesterday.  Patient/family open for short-term HD as per Nephrologist discussion with them on the weekend but patient not a long-term HD candidate.  Patient aware of HD because her son was on HD prior to renal transplant.  I communicated with Nephrologist this morning and waiting to hear back regarding further plans.  Anion gap metabolic acidosis  Secondary to progressive renal failure.  Continue bicarbonate supplements but will likely need HD.  Hyperkalemia  Secondary to progressive renal failure.  Changed diet to renal with low potassium diet.  EKG without acute changes.  Lokelma 10 g x 1 dose.  Await nephrology input regarding HD.  Repeat BMP this evening.  COVID-19 infection  Completed 5 days of remdesivir on 1/18.  Decadron not started since patient was not hypoxic.  Chest x-ray showed no acute abnormalities.  A. fib with RVR/pauses  Transient RVR in the ED which improved after IV fluid hydration.  Digoxin level normal early on in admission.  Continue Coumadin per pharmacy/INR supra therapeutic 4.5, may have to hold Coumadin temporarily.  Digoxin discontinued due to worsening renal insufficiency.  Telemetry showed transient bradycardia in the 30s, asymptomatic.  Also pause of 2.73 seconds.  Reduced Toprol-XL from 100 mg to 50 mg daily.  Continue telemetry.  EKG 1/18: A. fib with ventricular rate of 60/min, LAD, T wave inversion in lateral and inferior leads, Q waves in inferior and anterior leads with slow R wave progression.  QTc 438 ms.  No acute findings noted.  Supratherapeutic INR  Due to  Coumadin complicating poor oral intake and progressive renal failure.  INR up to 4.5.  Being managed by pharmacy, hold Coumadin.  Unsure if INR will need to be reversed for HD catheter placement.  Chronic systolic  CHF/cardiomyopathy  TTE 09/27/2017: LVEF 30-25%.  Clinically dehydrated on admission.  Held Lasix.  Appears to be slightly volume overloaded with trace edema of upper extremities.  IV Lasix 40 mg x 1 dose on 1/17 without significant urine output.  Total 600 mL urine output documented for yesterday.  Essential hypertension  Continue amlodipine 10 mg daily.  Reduce Toprol-XL to 50 mg daily due to transient bradycardia and pauses noted on telemetry.  Hyperlipidemia  Continue statins.  Type II DM with renal complications  Hyperglycemia now worsened by steroids.  Managed with SSI and adjust insulins as needed.  Reasonable inpatient control.  Body mass index is 27.5 kg/m.  Overweight  Nutritional Status Nutrition Problem: Increased nutrient needs Etiology: acute illness, catabolic [id number]) Signs/Symptoms: estimated needs Interventions: Ensure Enlive (each supplement provides 350kcal and 20 grams of protein), Liberalize Diet  DVT prophylaxis: Supratherapeutic INR on warfarin. Code Status: Full Family Communication: None at bedside. I discussed with patient's son via phone on 1/14, updated care and answered questions. Disposition: Patient not medically stable for discharge.   Consultants:   Nephrology  Procedures:   None  Antimicrobials:   None   Subjective:  Patient seen this morning.  States that she was eating something for the first time in several days, eating grits.  Reports she has been drinking lots of free water.  Continues to deny dyspnea, chest pain, palpitations, dizziness or lightheadedness.  Objective:   Vitals:   07/08/19 1600 07/08/19 2000 07/09/19 0353 07/09/19 0800  BP: 122/73 122/89 116/75 136/78  Pulse: 67 73 75 67  Resp: 18 15 16 15   Temp:  99.1 F (37.3 C) 98.7 F (37.1 C) 98.5 F (36.9 C)  TempSrc:  Oral Oral Oral  SpO2: 95% 96% 94% 97%  Weight:      Height:        General exam: Pleasant elderly female, moderately built and frail  sitting up comfortably in bed eating breakfast this morning.  Looks improved compared to the last 2 days although her labs look worse. Respiratory system: Clear to auscultation.  No increased work of breathing. Cardiovascular system: S1 and S2 heard, irregularly irregular.  No JVD, murmurs or pedal edema.  Trace bilateral upper extremity edema.  Telemetry personally reviewed: A. fib with controlled ventricular rate.  Transient slow ventricular rate of 38 bpm and a couple of pauses the longest of which was 2.73 seconds. Gastrointestinal system: Abdomen is nondistended, soft and nontender. No organomegaly or masses felt. Normal bowel sounds heard. Central nervous system: Alert and oriented. No focal neurological deficits. Extremities: Symmetric 5 x 5 power. Skin: No rashes, lesions or ulcers Psychiatry: Judgement and insight appear somewhat impaired. Mood & affect flat    Data Reviewed:   I have personally reviewed following labs and imaging studies   CBC: Recent Labs  Lab 07/05/19 0614 07/06/19 0149 07/07/19 0504 07/08/19 0532 07/09/19 0328  WBC 4.4   < > 3.6* 4.5 5.5  NEUTROABS 3.4  --   --   --   --   HGB 15.6*   < > 14.9 15.5* 16.2*  HCT 48.5*   < > 45.5 47.5* 49.2*  MCV 86.8   < > 86.8 86.1 85.6  PLT 233   < > 220 258 289   < > =  values in this interval not displayed.    Basic Metabolic Panel: Recent Labs  Lab 07/05/19 0614 07/06/19 0149 07/07/19 0504 07/08/19 0532 07/09/19 0328  NA 138   < > 140 143 141  K 4.8   < > 5.0 4.7 6.1*  CL 98   < > 107 108 109  CO2 22   < > 15* 19* 14*  GLUCOSE 183*   < > 116* 157* 206*  BUN 63*   < > 86* 98* 114*  CREATININE 3.58*   < > 4.28* 5.02* 6.22*  CALCIUM 8.1*   < > 7.6* 7.6* 7.5*  MG 1.8  --   --   --   --   PHOS  --   --   --   --  8.4*   < > = values in this interval not displayed.    Liver Function Tests: Recent Labs  Lab 07/06/19 0149 07/07/19 0504 07/09/19 0328  AST 31 26 29   ALT 17 14 13   ALKPHOS 57 57 62    BILITOT 0.8 0.9 1.2  PROT 5.7* 5.0* 5.5*  ALBUMIN 2.0* 1.7* 1.7*    CBG: Recent Labs  Lab 07/08/19 2141 07/09/19 0800 07/09/19 1227  GLUCAP 185* 165* 163*    Microbiology Studies:   Recent Results (from the past 240 hour(s))  Urine culture     Status: Abnormal   Collection Time: 07/06/19  8:23 PM   Specimen: Urine, Clean Catch  Result Value Ref Range Status   Specimen Description URINE, CLEAN CATCH  Final   Special Requests   Final    NONE Performed at [facility], [place] [address] [place], [place] [address]    Culture MULTIPLE SPECIES PRESENT, SUGGEST RECOLLECTION (A)  Final   Report Status 07/08/2019 FINAL  Final     Radiology Studies:  No results found.   Scheduled Meds:   . amLODipine  10 mg Oral Daily  . feeding supplement (ENSURE ENLIVE)  237 mL Oral BID BM  . insulin aspart  0-9 Units Subcutaneous TID WC  . latanoprost  1 drop Both Eyes QHS  . [START ON 07/10/2019] metoprolol succinate  50 mg Oral Daily  . pravastatin  40 mg Oral q1800  . sodium bicarbonate  650 mg Oral BID  . Warfarin - Pharmacist Dosing Inpatient   Does not apply q1800    Continuous Infusions:      LOS: 4 days     Vernell Leep, MD, [place], [facility]. [facility]    To contact the attending provider between 7A-7P or the covering provider during after hours 7P-7A, please log into the web site [email or web address] and access using universal [employer] password for that web site. If you do not have the password, please call the hospital operator.  07/09/2019, 1:20 PM

## 2019-07-09 NOTE — Progress Notes (Signed)
Subjective:    SCr from 5.0 to 6.2  K 6.1 this AM  Poor PO  Discussed HD, per prev notes / discussion rec starting as short course, she begrudgingly agrees  Attempted to call son [name] on 2#s in chart, no answer   Objective Vital signs in last 24 hours: Vitals:   07/08/19 2000 07/09/19 0353 07/09/19 0800 07/09/19 1200  BP: 122/89 116/75 136/78   Pulse: 73 75 67   Resp: 15 16 15    Temp: 99.1 F (37.3 C) 98.7 F (37.1 C) 98.5 F (36.9 C) 98.8 F (37.1 C)  TempSrc: Oral Oral Oral Oral  SpO2: 96% 94% 97%   Weight:      Height:       Weight change:   Intake/Output Summary (Last 24 hours) at 07/09/2019 1431 Last data filed at 07/09/2019 0600 Gross per 24 hour  Intake 480 ml  Output 600 ml  Net -120 ml    Assessment/Plan: 86 year old BF with baseline medical issues including CKD (crt 2 at baseline) who presents with COVID and A on CRF 1.Renal- A on CRF in the setting of COVID.  Subacute onset of Uremia, now with hyperkalemia.  As per prev conversation have rec trial of short term HD to temporize and permit time for GFR recovery, she agrees.  Will ask IR to help with [facility] and then HD thereafter. See 1/17 Neph note for further 2. Hypertension/volume  - BPstable  -  3.  Metabolic acidosis and borderline hyperkalemia-  Will improve with HD 4. Anemia  - hgb 16 5. COVID- remdesivir, per TRH 6. Hyperkalemia, will given another Lokelma today, BMP this PM 7. Supertherapeutic INR, per TRH, will need to bel ower for IR to place Temp cath  [place]: Basic Metabolic Panel: Recent Labs  Lab 07/07/19 0504 07/08/19 0532 07/09/19 0328  NA 140 143 141  K 5.0 4.7 6.1*  CL 107 108 109  CO2 15* 19* 14*  GLUCOSE 116* 157* 206*  BUN 86* 98* 114*  CREATININE 4.28* 5.02* 6.22*  CALCIUM 7.6* 7.6* 7.5*  PHOS  --   --  8.4*   Liver Function Tests: Recent Labs  Lab 07/06/19 0149 07/07/19 0504 07/09/19 0328  AST 31 26 29   ALT 17 14 13   ALKPHOS 57 57 62  BILITOT 0.8  0.9 1.2  PROT 5.7* 5.0* 5.5*  ALBUMIN 2.0* 1.7* 1.7*   Recent Labs  Lab 07/05/19 0017  LIPASE 25   No results for input(s): AMMONIA in the last 168 hours. CBC: Recent Labs  Lab 07/05/19 0614 07/05/19 0614 07/06/19 0149 07/06/19 0149 07/07/19 0504 07/08/19 0532 07/09/19 0328  WBC 4.4   < > 3.6*   < > 3.6* 4.5 5.5  NEUTROABS 3.4  --   --   --   --   --   --   HGB 15.6*   < > 15.0   < > 14.9 15.5* 16.2*  HCT 48.5*   < > 47.0*   < > 45.5 47.5* 49.2*  MCV 86.8  --  87.4  --  86.8 86.1 85.6  PLT 233   < > 203   < > 220 258 289   < > = values in this interval not displayed.   Cardiac Enzymes: No results for input(s): CKTOTAL, CKMB, CKMBINDEX, TROPONINI in the last 168 hours. CBG: Recent Labs  Lab 07/08/19 1303 07/08/19 1607 07/08/19 2141 07/09/19 0800 07/09/19 1227  GLUCAP 152* 159*  185* 165* 163*    Iron Studies:  No results for input(s): IRON, TIBC, TRANSFERRIN, FERRITIN in the last 72 hours. Studies/Results: No results found. Medications: Infusions:   Scheduled Medications: . amLODipine  10 mg Oral Daily  . feeding supplement (ENSURE ENLIVE)  237 mL Oral BID BM  . insulin aspart  0-9 Units Subcutaneous TID WC  . latanoprost  1 drop Both Eyes QHS  . [START ON 07/10/2019] metoprolol succinate  50 mg Oral Daily  . pravastatin  40 mg Oral q1800  . sodium bicarbonate  650 mg Oral BID  . Warfarin - Pharmacist Dosing Inpatient   Does not apply q1800    have reviewed scheduled and prn medications.  Physical Exam: Gen-  Sitting up in bedside chair.  Is distressed that she is so sick and that she may need dialysis HEENT-  PERRLA Lungs- CBS bilat CV-  RRR abd-  Soft, non tender Ext- min edema   07/09/2019,2:31 PM  LOS: 4 days

## 2019-07-09 NOTE — Progress Notes (Signed)
ANTICOAGULATION CONSULT NOTE - Follow up Pharmacy Consult for warfarin Indication: atrial fibrillation (CHADS2VASc = 7)  No Known Allergies  Patient Measurements: Height: 5\' 2"  (157.5 cm) Weight: 150 lb 5.7 oz (68.2 kg) IBW/kg (Calculated) : 50.1   Vital Signs: Temp: 98.5 F (36.9 C) (01/18 0800) Temp Source: Oral (01/18 0800) BP: 136/78 (01/18 0800) Pulse Rate: 67 (01/18 0800)  Labs: Recent Labs    07/07/19 0504 07/07/19 0504 07/08/19 0532 07/09/19 0328  HGB 14.9   < > 15.5* 16.2*  HCT 45.5  --  47.5* 49.2*  PLT 220  --  258 289  LABPROT 31.9*  --  38.2* 42.5*  INR 3.1*  --  3.9* 4.5*  CREATININE 4.28*  --  5.02* 6.22*   < > = values in this interval not displayed.    Estimated Creatinine Clearance: 5.7 mL/min (A) (by C-G formula based on SCr of 6.22 mg/dL (H)).   Assessment: 86 yo W on warfarin for afib (CHADS2VASc = 7). Followed by Dr. Claris Gower [facility], [phone], who's staff said patient was last seen 06/11/19 and instructed to take 6mg -3mg -3mg -3mg  with follow up on 06/25/19, which the patient missed. Per patient, she's taking warfarin 6mg -6mg -3mg -3mg -3mg -6mg . Unclear from both sources which days of the week are 3mg  vs 6mg . Confirmed she took warfarin on 07/03/19 before admission. D-dimer < 5.   INR up to 4.5 today. Poor PO at least 3 days before admission, eating 0% yesterday, none noted today. CBC stable (Hbg 16.2, plt 289). No bleeding reported.  PTA Warfarin: 3-6mg , unclear exact dose but likely 3mg  for 3-4 days/wk and 6mg  all other days.  Goal of Therapy:  INR 2-3 Monitor platelets by anticoagulation protocol: Yes   Plan:  Hold Warfarin today Daily INR, CBC, monitor for bleeding    Thank you for allowing pharmacy to be part of this patients care team. Nicole Cella, RPh Clinical Pharmacist Please check AMION for all [place] phone numbers After 10:00 PM, call [place] [phone] 07/09/2019 11:23 AM

## 2019-07-10 ENCOUNTER — Inpatient Hospital Stay (HOSPITAL_COMMUNITY): Payer: Medicare Other

## 2019-07-10 HISTORY — PX: IR US GUIDE VASC ACCESS RIGHT: IMG2390

## 2019-07-10 HISTORY — PX: IR FLUORO GUIDE CV LINE RIGHT: IMG2283

## 2019-07-10 LAB — CBC
HCT: 47.5 % — ABNORMAL HIGH (ref 36.0–46.0)
Hemoglobin: 15.8 g/dL — ABNORMAL HIGH (ref 12.0–15.0)
MCH: 27.8 pg (ref 26.0–34.0)
MCHC: 33.3 g/dL (ref 30.0–36.0)
MCV: 83.6 fL (ref 80.0–100.0)
Platelets: 291 10*3/uL (ref 150–400)
RBC: 5.68 MIL/uL — ABNORMAL HIGH (ref 3.87–5.11)
RDW: 16.1 % — ABNORMAL HIGH (ref 11.5–15.5)
WBC: 5 10*3/uL (ref 4.0–10.5)
nRBC: 0 % (ref 0.0–0.2)

## 2019-07-10 LAB — GLUCOSE, CAPILLARY
Glucose-Capillary: 133 mg/dL — ABNORMAL HIGH (ref 70–99)
Glucose-Capillary: 143 mg/dL — ABNORMAL HIGH (ref 70–99)
Glucose-Capillary: 127 mg/dL — ABNORMAL HIGH (ref 70–99)

## 2019-07-10 LAB — PROTIME-INR
INR: 4.4 (ref 0.8–1.2)
Prothrombin Time: 41.9 seconds — ABNORMAL HIGH (ref 11.4–15.2)

## 2019-07-10 LAB — RENAL FUNCTION PANEL
Albumin: 1.6 g/dL — ABNORMAL LOW (ref 3.5–5.0)
Anion gap: 17 — ABNORMAL HIGH (ref 5–15)
BUN: 130 mg/dL — ABNORMAL HIGH (ref 8–23)
CO2: 17 mmol/L — ABNORMAL LOW (ref 22–32)
Calcium: 7.4 mg/dL — ABNORMAL LOW (ref 8.9–10.3)
Chloride: 109 mmol/L (ref 98–111)
Creatinine, Ser: 7.73 mg/dL — ABNORMAL HIGH (ref 0.44–1.00)
GFR calc Af Amer: 5 mL/min — ABNORMAL LOW (ref >60)
GFR calc non Af Amer: 4 mL/min — ABNORMAL LOW (ref >60)
Glucose, Bld: 130 mg/dL — ABNORMAL HIGH (ref 70–99)
Phosphorus: 7.9 mg/dL — ABNORMAL HIGH (ref 2.5–4.6)
Potassium: 4.7 mmol/L (ref 3.5–5.1)
Sodium: 143 mmol/L (ref 135–145)

## 2019-07-10 LAB — HEPATITIS B SURFACE ANTIBODY,QUALITATIVE: Hep B S Ab: NONREACTIVE

## 2019-07-10 LAB — HEPATITIS B SURFACE ANTIGEN: Hepatitis B Surface Ag: NONREACTIVE

## 2019-07-10 LAB — HEPATITIS B CORE ANTIBODY, TOTAL: Hep B Core Total Ab: NONREACTIVE

## 2019-07-10 MED ORDER — LIDOCAINE HCL 1 % IJ SOLN
INTRAMUSCULAR | Status: AC
  Filled 2019-07-10: qty 20

## 2019-07-10 MED ORDER — ALTEPLASE 2 MG IJ SOLR: 2.0000 mg | Freq: Once | INTRAMUSCULAR | Status: DC | PRN

## 2019-07-10 MED ORDER — LIDOCAINE HCL 1 % IJ SOLN
INTRAMUSCULAR | Status: DC | PRN
  Administered 2019-07-10: 5 mL

## 2019-07-10 MED ORDER — HEPARIN SODIUM (PORCINE) 1000 UNIT/ML IJ SOLN
INTRAMUSCULAR | Status: AC
  Administered 2019-07-10: 2.6 mL
  Filled 2019-07-10: qty 1

## 2019-07-10 MED ORDER — SODIUM CHLORIDE 0.9 % IV SOLN: 100.0000 mL | INTRAVENOUS | Status: DC | PRN

## 2019-07-10 MED ORDER — HEPARIN SODIUM (PORCINE) 1000 UNIT/ML DIALYSIS: 1000.0000 [IU] | INTRAMUSCULAR | Status: DC | PRN

## 2019-07-10 NOTE — Procedures (Signed)
  Procedure: R IJ [place] 16cm HD catheter   EBL:   minimal Complications:  none immediate  See full dictation in [facility].  Dillard Cannon MD Main # [phone] Pager  [phone]

## 2019-07-10 NOTE — Progress Notes (Signed)
ANTICOAGULATION CONSULT NOTE - Follow up Pharmacy Consult for warfarin Indication: atrial fibrillation (CHADS2VASc = 7)  No Known Allergies  Patient Measurements: Height: 5\' 2"  (157.5 cm) Weight: 162 lb 0.6 oz (73.5 kg) IBW/kg (Calculated) : 50.1   Vital Signs: Temp: 97.6 F (36.4 C) (01/19 0800) Temp Source: Oral (01/19 0800) BP: 139/85 (01/19 0800) Pulse Rate: 64 (01/19 0800)  Labs: Recent Labs    07/08/19 0532 07/08/19 0532 07/09/19 0328 07/09/19 1608 07/10/19 0645  HGB 15.5*   < > 16.2*  --  15.8*  HCT 47.5*  --  49.2*  --  47.5*  PLT 258  --  289  --  291  LABPROT 38.2*  --  42.5*  --  41.9*  INR 3.9*  --  4.5*  --  4.4*  CREATININE 5.02*   < > 6.22* 6.81* 7.73*   < > = values in this interval not displayed.    Estimated Creatinine Clearance: 4.7 mL/min (A) (by C-G formula based on SCr of 7.73 mg/dL (H)).   Assessment: 86 yo W on warfarin for afib (CHADS2VASc = 7). Followed by Dr. Claris Gower [facility], [phone], who's staff said patient was last seen 06/11/19 and instructed to take 6mg -3mg -3mg -3mg  with follow up on 06/25/19, which the patient missed. Per patient, she's taking warfarin 6mg -6mg -3mg -3mg -3mg -6mg . Unclear from both sources which days of the week are 3mg  vs 6mg . Confirmed she took warfarin on 07/03/19 before admission. D-dimer < 5.   INR 4.4 today- remains SUPRA- therapeutic. Poor PO at least 3 days before admission, eating 0% yesterday, only 10-25% of meals yesterday.  CBC stable (Hbg 15.8, plt 291). No bleeding reported.  PTA Warfarin: 3-6mg , unclear exact dose but likely 3mg  for 3-4 days/wk and 6mg  all other days.  Goal of Therapy:  INR 2-3 Monitor platelets by anticoagulation protocol: Yes   Plan:  Hold Warfarin today Daily INR, CBC, monitor for bleeding   Sloan Leiter, PharmD, BCPS, BCCCP Clinical Pharmacist Please refer to [facility] for [place] numbers 07/10/2019 2:14 PM

## 2019-07-10 NOTE — Progress Notes (Signed)
CRITICAL VALUE STICKER  CRITICAL VALUE: PT: 41.9 INR: 4.4  RECEIVER (on-site recipient of call): Patirica Longshore S  DATE & TIME NOTIFIED: 07/10/19 0946  MESSENGER (representative from lab): Fara Olden  MD NOTIFIED: Dr. Lolita Lenz  TIME OF NOTIFICATION: 1035  RESPONSE: No response from doctor

## 2019-07-10 NOTE — Progress Notes (Signed)
Subjective:    Creatinine further worsened 7.7, BUN 130, K4.7  IR to place HD catheter today, start dialysis  No reported UOP okay  Late in the day yesterday was able to speak to patient's son, [name], and provide an update  Objective Vital signs in last 24 hours: Vitals:   07/09/19 2000 07/10/19 0621 07/10/19 0649 07/10/19 0800  BP: 110/78 (!) 143/105  139/85  Pulse: (!) 54 (!) 45  64  Resp: 15 20  15   Temp: 98.1 F (36.7 C) 97.8 F (36.6 C)  97.6 F (36.4 C)  TempSrc: Oral   Oral  SpO2: 96% 99%  98%  Weight:   73.5 kg   Height:       Weight change:   Intake/Output Summary (Last 24 hours) at 07/10/2019 1433 Last data filed at 07/10/2019 1300 Gross per 24 hour  Intake 470 ml  Output --  Net 470 ml    Assessment/Plan: 86 year old BF with baseline medical issues including CKD (crt 2 at baseline) who presents with COVID and A on CRF 1.Renal- A on CRF in the setting of COVID.  Subacute onset of Uremia, hyperkalemia requiring Lokelma.  As per prev conversation have rec trial of short term HD to temporize and permit time for GFR recovery, she agrees.  Plan for catheter today followed by first treatment.  See 1/17 Neph note for further 2. Hypertension/volume  - BPstable  -  3.  Metabolic acidosis and borderline hyperkalemia-  Will improve with HD 4. Anemia  - hgb 16 5. COVID- remdesivir, per TRH 6. Hyperkalemia, improved after Lokelma yesterday 7. Supertherapeutic INR, per TRH,   Rexene Agent    Labs: Basic Metabolic Panel: Recent Labs  Lab 07/09/19 0328 07/09/19 1608 07/10/19 0645  NA 141 140 143  K 6.1* 4.6 4.7  CL 109 107 109  CO2 14* 16* 17*  GLUCOSE 206* 156* 130*  BUN 114* 121* 130*  CREATININE 6.22* 6.81* 7.73*  CALCIUM 7.5* 7.4* 7.4*  PHOS 8.4*  --  7.9*   Liver Function Tests: Recent Labs  Lab 07/06/19 0149 07/06/19 0149 07/07/19 0504 07/09/19 0328 07/10/19 0645  AST 31  --  26 29  --   ALT 17  --  14 13  --   ALKPHOS 57  --  57 62  --    BILITOT 0.8  --  0.9 1.2  --   PROT 5.7*  --  5.0* 5.5*  --   ALBUMIN 2.0*   < > 1.7* 1.7* 1.6*   < > = values in this interval not displayed.   Recent Labs  Lab 07/05/19 0017  LIPASE 25   No results for input(s): AMMONIA in the last 168 hours. CBC: Recent Labs  Lab 07/05/19 0614 07/05/19 0614 07/06/19 0149 07/06/19 0149 07/07/19 0504 07/07/19 0504 07/08/19 0532 07/09/19 0328 07/10/19 0645  WBC 4.4   < > 3.6*   < > 3.6*   < > 4.5 5.5 5.0  NEUTROABS 3.4  --   --   --   --   --   --   --   --   HGB 15.6*   < > 15.0   < > 14.9   < > 15.5* 16.2* 15.8*  HCT 48.5*   < > 47.0*   < > 45.5   < > 47.5* 49.2* 47.5*  MCV 86.8   < > 87.4  --  86.8  --  86.1 85.6 83.6  PLT 233   < >  203   < > 220   < > 258 289 291   < > = values in this interval not displayed.   Cardiac Enzymes: No results for input(s): CKTOTAL, CKMB, CKMBINDEX, TROPONINI in the last 168 hours. CBG: Recent Labs  Lab 07/09/19 1227 07/09/19 1654 07/09/19 2117 07/10/19 0805 07/10/19 1219  GLUCAP 163* 129* 116* 133* 143*    Iron Studies:  No results for input(s): IRON, TIBC, TRANSFERRIN, FERRITIN in the last 72 hours. Studies/Results: No results found. Medications: Infusions:   Scheduled Medications: . amLODipine  10 mg Oral Daily  . Chlorhexidine Gluconate Cloth  6 each Topical Q0600  . feeding supplement (ENSURE ENLIVE)  237 mL Oral BID BM  . insulin aspart  0-9 Units Subcutaneous TID WC  . latanoprost  1 drop Both Eyes QHS  . metoprolol succinate  50 mg Oral Daily  . pravastatin  40 mg Oral q1800  . sodium bicarbonate  650 mg Oral BID  . sodium zirconium cyclosilicate  10 g Oral BID  . Warfarin - Pharmacist Dosing Inpatient   Does not apply q1800    have reviewed scheduled and prn medications.  Physical Exam: Gen-  Sitting up in bedside chair.  Is distressed that she is so sick and that she may need dialysis HEENT-  PERRLA Lungs- CBS bilat CV-  RRR abd-  Soft, non tender Ext- min edema    07/10/2019,2:33 PM  LOS: 5 days

## 2019-07-10 NOTE — Progress Notes (Signed)
PROGRESS NOTE   Patricia Randall  [id number]    DOB: Oct 08, 1928    DOA: 07/04/2019  PCP: Leonard Downing, MD   I have briefly reviewed patients previous medical records in [facility].  Chief Complaint:   Chief Complaint  Patient presents with  . Failure To Thrive    Brief Narrative:  86 year old female patient with PMH of atrial fibrillation on Warfarin, chronic systolic CHF, LVEF [id number] by TTE 09/27/2017, hyperlipidemia, hypertension, type II DM, stage IV CKD (baseline creatinine ~1.8), presented to the ER due to increasing fatigue, weakness, poor appetite, decreased oral intake, decreased mobility and laying in the bed all the time for the last 3 days.  Transiently in A. fib with RVR in the ED, improved after IV fluids.  Admitted for acute on chronic kidney disease due to COVID-19 infection with associated poor oral intake and dehydration.  Despite IV hydration, acute kidney injury continues to worsen, hyperkalemia, metabolic acidosis, suspect some uremia and volume overloaded. Patient/family open for short-term HD as per Nephrologist discussion with them on the weekend but patient not a long-term HD candidate. Going for HD catheter followed by initiation of HD today.  Assessment & Plan:  Principal Problem:   COVID-19 virus infection Active Problems:   Permanent atrial fibrillation (HCC)   Cardiomyopathy ([place])   DM (diabetes mellitus), type 2 with renal complications (HCC)   AKI (acute kidney injury) ([place])   Acute kidney injury complicating CKD stage IV/metabolic acidosis/hyperkalemia/uremia Most likely due to COVID-19 infection related to poor oral intake and continued Lasix use.  Held Lasix temporarily. Baseline creatinine ~1.8, presented with creatinine of 3.42. Renal ultrasound with chronic medical disease and no hydronephrosis.  As per nephrology, no exposure to nephrotoxins, no evidence of hemodynamic derangements.  Added oral sodium bicarbonate tablets. Despite IV  hydration, acute kidney injury continues to worsen (creatinine up to 6.22, hyperkalemia (6.1)-without EKG changes, metabolic acidosis (bicarb 14), suspect some uremia and volume overloaded.  No change despite a dose of IV Lasix 40 mg yesterday.  Patient/family open for short-term HD as per Nephrologist discussion with them on the weekend but patient not a long-term HD candidate.  Patient aware of HD because her son was on HD prior to renal transplant.  -Going for HD catheter by IR today followed by initiation of dialysis.  Anion gap metabolic acidosis  Secondary to progressive renal failure.  Continue bicarbonate supplements but will likely need HD.  Hyperkalemia. Resolved with Lokelma. Secondary to progressive renal failure. - COVID-19 infection  Completed 5 days of remdesivir on 1/18.  Decadron not started since patient was not hypoxic.  Chest x-ray showed no acute abnormalities.  A. fib with RVR/pauses  Transient RVR in the ED which improved after IV fluid hydration.  Digoxin level normal early on in admission.  Continue Coumadin per pharmacy/INR supra therapeutic 4.5, may have to hold Coumadin temporarily.  Digoxin discontinued due to worsening renal insufficiency.     Reduced Toprol-XL from 100 mg to 50 mg daily due to bradycardia. - continue telemetry.    Supratherapeutic INR  Due to Coumadin complicating poor oral intake and progressive renal failure.  INR up to 4.5>>4.4  Being managed by pharmacy, hold Coumadin.  Unsure if INR will need to be reversed for HD catheter placement.  Chronic systolic CHF/cardiomyopathy TTE 09/27/2017: LVEF 30-25%.  Clinically dehydrated on admission.  Held Lasix.  Appears to be slightly volume overloaded with trace edema of upper extremities.  IV Lasix 40 mg x 1 dose  on 1/17 without significant urine output.   -Going for HD today.  Essential hypertension  Continue amlodipine 10 mg daily.  Reduce Toprol-XL to 50 mg daily due to transient bradycardia  and pauses noted on telemetry.  Hyperlipidemia  Continue statins.  Type II DM with renal complications  Hyperglycemia now worsened by steroids.  Managed with SSI and adjust insulins as needed.  Reasonable inpatient control.   Overweight  Body mass index is 29.64 kg/m.    Nutritional Status Nutrition Problem: Increased nutrient needs Etiology: acute illness, catabolic [id number]) Signs/Symptoms: estimated needs Interventions: Ensure Enlive (each supplement provides 350kcal and 20 grams of protein), Liberalize Diet  DVT prophylaxis: Supratherapeutic INR on warfarin. Code Status: Full Family Communication: None at bedside. Disposition: Patient not medically stable for discharge.  Pending improvement.   Consultants:   Nephrology  Procedures:   HD catheter placement.  Antimicrobials:   None   Subjective:  Patient was feeling better when seen this morning.  Denies any new complaints.  She was aware of starting dialysis today.  Objective:   Vitals:   07/10/19 0621 07/10/19 0649 07/10/19 0800 07/10/19 1638  BP: (!) 143/105  139/85 129/76  Pulse: (!) 45  64 82  Resp: 20  15 18   Temp: 97.8 F (36.6 C)  97.6 F (36.4 C) 97.9 F (36.6 C)  TempSrc:   Oral Oral  SpO2: 99%  98% 97%  Weight:  73.5 kg    Height:        General exam: Pleasant elderly female, moderately built and frail, in no acute distress.   Respiratory system: Clear to auscultation.  No increased work of breathing. Cardiovascular system: S1 and S2 heard, irregularly irregular.  No JVD, murmurs or pedal edema.  Trace bilateral upper extremity edema.  Gastrointestinal system: Abdomen is nondistended, soft and nontender. No organomegaly or masses felt. Normal bowel sounds heard. Central nervous system: Alert and oriented. No focal neurological deficits. Extremities: Symmetric 5 x 5 power. Skin: No rashes, lesions or ulcers Psychiatry: Judgement and insight appear somewhat impaired. Mood & affect  flat  Data Reviewed:   I have personally reviewed following labs and imaging studies   CBC: Recent Labs  Lab 07/05/19 0614 07/06/19 0149 07/08/19 0532 07/09/19 0328 07/10/19 0645  WBC 4.4   < > 4.5 5.5 5.0  NEUTROABS 3.4  --   --   --   --   HGB 15.6*   < > 15.5* 16.2* 15.8*  HCT 48.5*   < > 47.5* 49.2* 47.5*  MCV 86.8   < > 86.1 85.6 83.6  PLT 233   < > 258 289 291   < > = values in this interval not displayed.    Basic Metabolic Panel: Recent Labs  Lab 07/05/19 0614 07/06/19 0149 07/09/19 0328 07/09/19 1608 07/10/19 0645  NA 138   < > 141 140 143  K 4.8   < > 6.1* 4.6 4.7  CL 98   < > 109 107 109  CO2 22   < > 14* 16* 17*  GLUCOSE 183*   < > 206* 156* 130*  BUN 63*   < > 114* 121* 130*  CREATININE 3.58*   < > 6.22* 6.81* 7.73*  CALCIUM 8.1*   < > 7.5* 7.4* 7.4*  MG 1.8  --   --   --   --   PHOS  --   --  8.4*  --  7.9*   < > = values in this interval  not displayed.    Liver Function Tests: Recent Labs  Lab 07/06/19 0149 07/06/19 0149 07/07/19 0504 07/09/19 0328 07/10/19 0645  AST 31  --  26 29  --   ALT 17  --  14 13  --   ALKPHOS 57  --  57 62  --   BILITOT 0.8  --  0.9 1.2  --   PROT 5.7*  --  5.0* 5.5*  --   ALBUMIN 2.0*   < > 1.7* 1.7* 1.6*   < > = values in this interval not displayed.    CBG: Recent Labs  Lab 07/09/19 2117 07/10/19 0805 07/10/19 1219  GLUCAP 116* 133* 143*    Microbiology Studies:   Recent Results (from the past 240 hour(s))  Urine culture     Status: Abnormal   Collection Time: 07/06/19  8:23 PM   Specimen: Urine, Clean Catch  Result Value Ref Range Status   Specimen Description URINE, CLEAN CATCH  Final   Special Requests   Final    NONE Performed at [facility], [place] [address], [place], [place] [address]    Culture MULTIPLE SPECIES PRESENT, SUGGEST RECOLLECTION (A)  Final   Report Status 07/08/2019 FINAL  Final     Radiology Studies:  No results found.   Scheduled Meds:   . amLODipine  10  mg Oral Daily  . Chlorhexidine Gluconate Cloth  6 each Topical Q0600  . feeding supplement (ENSURE ENLIVE)  237 mL Oral BID BM  . heparin      . insulin aspart  0-9 Units Subcutaneous TID WC  . latanoprost  1 drop Both Eyes QHS  . lidocaine      . metoprolol succinate  50 mg Oral Daily  . pravastatin  40 mg Oral q1800  . sodium bicarbonate  650 mg Oral BID  . sodium zirconium cyclosilicate  10 g Oral BID  . Warfarin - Pharmacist Dosing Inpatient   Does not apply q1800    Continuous Infusions:      LOS: 5 days    Lorella Nimrod, MD,  [facility]    To contact the attending provider between 7A-7P or the covering provider during after hours 7P-7A, please log into the web site [email or web address] and access using universal [place] password for that web site. If you do not have the password, please call the hospital operator.  07/10/2019, 5:20 PM

## 2019-07-11 DIAGNOSIS — N186 End stage renal disease: Secondary | ICD-10-CM

## 2019-07-11 LAB — GLUCOSE, CAPILLARY
Glucose-Capillary: 137 mg/dL — ABNORMAL HIGH (ref 70–99)
Glucose-Capillary: 180 mg/dL — ABNORMAL HIGH (ref 70–99)
Glucose-Capillary: 79 mg/dL (ref 70–99)
Glucose-Capillary: 94 mg/dL (ref 70–99)

## 2019-07-11 LAB — RENAL FUNCTION PANEL
Albumin: 1.6 g/dL — ABNORMAL LOW (ref 3.5–5.0)
Anion gap: 17 — ABNORMAL HIGH (ref 5–15)
BUN: 60 mg/dL — ABNORMAL HIGH (ref 8–23)
CO2: 20 mmol/L — ABNORMAL LOW (ref 22–32)
Calcium: 7.3 mg/dL — ABNORMAL LOW (ref 8.9–10.3)
Chloride: 101 mmol/L (ref 98–111)
Creatinine, Ser: 5.04 mg/dL — ABNORMAL HIGH (ref 0.44–1.00)
GFR calc Af Amer: 8 mL/min — ABNORMAL LOW (ref >60)
GFR calc non Af Amer: 7 mL/min — ABNORMAL LOW (ref >60)
Glucose, Bld: 108 mg/dL — ABNORMAL HIGH (ref 70–99)
Phosphorus: 4.8 mg/dL — ABNORMAL HIGH (ref 2.5–4.6)
Potassium: 3.5 mmol/L (ref 3.5–5.1)
Sodium: 138 mmol/L (ref 135–145)

## 2019-07-11 LAB — PROTIME-INR
INR: 5.3 (ref 0.8–1.2)
Prothrombin Time: 48.5 seconds — ABNORMAL HIGH (ref 11.4–15.2)

## 2019-07-11 MED ORDER — NEPRO/CARBSTEADY PO LIQD
237.0000 mL | Freq: Three times a day (TID) | ORAL | Status: DC
  Administered 2019-07-12 – 2019-07-15 (×4): 237 mL via ORAL

## 2019-07-11 MED ORDER — SODIUM CHLORIDE 0.9% FLUSH
10.0000 mL | Freq: Two times a day (BID) | INTRAVENOUS | Status: DC
  Administered 2019-07-11 – 2019-07-17 (×9): 10 mL

## 2019-07-11 MED ORDER — SODIUM CHLORIDE 0.9% FLUSH
10.0000 mL | INTRAVENOUS | Status: DC | PRN
  Administered 2019-07-11 – 2019-07-13 (×3): 10 mL

## 2019-07-11 MED ORDER — IPRATROPIUM-ALBUTEROL 20-100 MCG/ACT IN AERS
1.0000 | INHALATION_SPRAY | Freq: Four times a day (QID) | RESPIRATORY_TRACT | Status: DC | PRN
  Filled 2019-07-11: qty 4

## 2019-07-11 NOTE — Progress Notes (Signed)
Subjective:    Received temporary HD catheter yesterday followed by first dialysis treatment  Is more awake, alert this morning  Labs improved as expected, K is 3.5  No reported UOP   RA  Objective Vital signs in last 24 hours: Vitals:   07/11/19 0330 07/11/19 0414 07/11/19 0432 07/11/19 0843  BP: (!) 105/41 (!) 138/47  115/85  Pulse: 67 73  61  Resp:  19  16  Temp:  (!) 97 F (36.1 C) (!) 97.5 F (36.4 C)   TempSrc:  Axillary Oral   SpO2:    96%  Weight:  69.3 kg    Height:       Weight change: 0.5 kg  Intake/Output Summary (Last 24 hours) at 07/11/2019 1149 Last data filed at 07/11/2019 0405 Gross per 24 hour  Intake 280 ml  Output 0 ml  Net 280 ml    Assessment/Plan: 86 year old BF with baseline medical issues including CKD (crt 2 at baseline) who presents with COVID and A on CRF 1.Renal- A on CRF in the setting of COVID.  Subacute onset of Uremia, hyperkalemia requiring Lokelma. Initiated dialysis 1/19 with temporary right IJ catheter for uremia, to see if we can have her stabilize and recover some GFR. Tolerated first treatment, appears improved here today. Plan for next treatment on 1/21, gradually increasing dialysis dose. 2. Hypertension/volume  - BP stable  -  3.  Metabolic acidosis and borderline hyperkalemia-  Will improve with HD 4. Anemia  - not an issue 5. COVID- remdesivir, per [facility] 6. Hyperkalemia, improved stop Lokelma 7. Supertherapeutic INR, per [facility],   Rexene Agent    Labs: Basic Metabolic Panel: Recent Labs  Lab 07/09/19 0328 07/09/19 0328 07/09/19 1608 07/10/19 0645 07/11/19 0512  NA 141   < > 140 143 138  K 6.1*   < > 4.6 4.7 3.5  CL 109   < > 107 109 101  CO2 14*   < > 16* 17* 20*  GLUCOSE 206*   < > 156* 130* 108*  BUN 114*   < > 121* 130* 60*  CREATININE 6.22*   < > 6.81* 7.73* 5.04*  CALCIUM 7.5*   < > 7.4* 7.4* 7.3*  PHOS 8.4*  --   --  7.9* 4.8*   < > = values in this interval not displayed.   Liver Function  Tests: Recent Labs  Lab 07/06/19 0149 07/06/19 0149 07/07/19 0504 07/07/19 0504 07/09/19 0328 07/10/19 0645 07/11/19 0512  AST 31  --  26  --  29  --   --   ALT 17  --  14  --  13  --   --   ALKPHOS 57  --  57  --  62  --   --   BILITOT 0.8  --  0.9  --  1.2  --   --   PROT 5.7*  --  5.0*  --  5.5*  --   --   ALBUMIN 2.0*   < > 1.7*   < > 1.7* 1.6* 1.6*   < > = values in this interval not displayed.   Recent Labs  Lab 07/05/19 0017  LIPASE 25   No results for input(s): AMMONIA in the last 168 hours. CBC: Recent Labs  Lab 07/05/19 0614 07/05/19 [id number] 07/06/19 0149 07/06/19 0149 07/07/19 0504 07/07/19 0504 07/08/19 0532 07/09/19 0328 07/10/19 0645  WBC 4.4   < > 3.6*   < > 3.6*   < >  4.5 5.5 5.0  NEUTROABS 3.4  --   --   --   --   --   --   --   --   HGB 15.6*   < > 15.0   < > 14.9   < > 15.5* 16.2* 15.8*  HCT 48.5*   < > 47.0*   < > 45.5   < > 47.5* 49.2* 47.5*  MCV 86.8   < > 87.4  --  86.8  --  86.1 85.6 83.6  PLT 233   < > 203   < > 220   < > 258 289 291   < > = values in this interval not displayed.   Cardiac Enzymes: No results for input(s): CKTOTAL, CKMB, CKMBINDEX, TROPONINI in the last 168 hours. CBG: Recent Labs  Lab 07/09/19 2117 07/10/19 0805 07/10/19 1219 07/10/19 2038 07/11/19 0805  GLUCAP 116* 133* 143* 127* 79    Iron Studies:  No results for input(s): IRON, TIBC, TRANSFERRIN, FERRITIN in the last 72 hours. Studies/Results: IR Fluoro Guide CV Line Right  Result Date: 07/11/2019 CLINICAL DATA:  Renal failure. COVID positive. Needs access for hemodialysis EXAM: EXAM RIGHT IJ CATHETER PLACEMENT UNDER ULTRASOUND AND FLUOROSCOPIC GUIDANCE TECHNIQUE: The procedure, risks (including but not limited to bleeding, infection, organ damage, pneumothorax), benefits, and alternatives were explained to the patient. Questions regarding the procedure were encouraged and answered. The patient understands and consents to the procedure. Patency of the right IJ  vein was confirmed with ultrasound with image documentation. An appropriate skin site was determined. Skin site was marked. Region was prepped using maximum barrier technique including cap and mask, sterile gown, sterile gloves, large sterile sheet, and Chlorhexidine as cutaneous antisepsis. The region was infiltrated locally with 1% lidocaine. Under real-time ultrasound guidance, the right IJ vein was accessed with a 21 gauge needle; the needle tip within the vein was confirmed with ultrasound image documentation. The needle exchanged over a 018 guidewire for vascular dilator which allowed advancement of a 16 cm Mahurkar catheter. This was positioned with the tip at the cavoatrial junction. Spot chest radiograph shows good positioning and no pneumothorax. Catheter was flushed and sutured externally with 0-Prolene sutures. Patient tolerated the procedure well. FLUOROSCOPY TIME:  0.2 minutes; 12 uGym2 DAP COMPLICATIONS: COMPLICATIONS none IMPRESSION: 1. Technically successful right IJ Mahurkar catheter placement. Electronically Signed   By: Lucrezia Europe M.D.   On: 07/11/2019 07:13   IR US Guide Vasc Access Right  Result Date: 07/11/2019 CLINICAL DATA:  Renal failure. COVID positive. Needs access for hemodialysis EXAM: EXAM RIGHT IJ CATHETER PLACEMENT UNDER ULTRASOUND AND FLUOROSCOPIC GUIDANCE TECHNIQUE: The procedure, risks (including but not limited to bleeding, infection, organ damage, pneumothorax), benefits, and alternatives were explained to the patient. Questions regarding the procedure were encouraged and answered. The patient understands and consents to the procedure. Patency of the right IJ vein was confirmed with ultrasound with image documentation. An appropriate skin site was determined. Skin site was marked. Region was prepped using maximum barrier technique including cap and mask, sterile gown, sterile gloves, large sterile sheet, and Chlorhexidine as cutaneous antisepsis. The region was infiltrated  locally with 1% lidocaine. Under real-time ultrasound guidance, the right IJ vein was accessed with a 21 gauge needle; the needle tip within the vein was confirmed with ultrasound image documentation. The needle exchanged over a 018 guidewire for vascular dilator which allowed advancement of a 16 cm Mahurkar catheter. This was positioned with the tip at the cavoatrial junction. Spot  chest radiograph shows good positioning and no pneumothorax. Catheter was flushed and sutured externally with 0-Prolene sutures. Patient tolerated the procedure well. FLUOROSCOPY TIME:  0.2 minutes; 12 uGym2 DAP COMPLICATIONS: COMPLICATIONS none IMPRESSION: 1. Technically successful right IJ Mahurkar catheter placement. Electronically Signed   By: Lucrezia Europe M.D.   On: 07/11/2019 07:13   Medications: Infusions: . sodium chloride    . sodium chloride      Scheduled Medications: . amLODipine  10 mg Oral Daily  . Chlorhexidine Gluconate Cloth  6 each Topical Q0600  . feeding supplement (ENSURE ENLIVE)  237 mL Oral BID BM  . insulin aspart  0-9 Units Subcutaneous TID WC  . latanoprost  1 drop Both Eyes QHS  . metoprolol succinate  50 mg Oral Daily  . pravastatin  40 mg Oral q1800  . sodium bicarbonate  650 mg Oral BID  . Warfarin - Pharmacist Dosing Inpatient   Does not apply q1800    have reviewed scheduled and prn medications.  Physical Exam: Gen-  NAD, more awake/alert/interactive HEENT-  PERRLA Lungs- CBS bilat CV-  RRR abd-  Soft, non tender Ext- min edema  R IJ Temp HD Cath (placed 1/19 IR)  07/11/2019,11:49 AM  LOS: 6 days

## 2019-07-11 NOTE — Progress Notes (Signed)
ANTICOAGULATION CONSULT NOTE - Follow up Pharmacy Consult for Warfarin Indication: atrial fibrillation  No Known Allergies  Patient Measurements: Height: 5\' 2"  (157.5 cm) Weight: 152 lb 12.5 oz (69.3 kg) IBW/kg (Calculated) : 50.1   Vital Signs: Temp: 97.5 F (36.4 C) (01/20 0432) Temp Source: Oral (01/20 0432) BP: 115/85 (01/20 0843) Pulse Rate: 61 (01/20 0843)  Labs: Recent Labs    07/09/19 0328 07/09/19 0328 07/09/19 1608 07/10/19 0645 07/11/19 0512  HGB 16.2*  --   --  15.8*  --   HCT 49.2*  --   --  47.5*  --   PLT 289  --   --  291  --   LABPROT 42.5*  --   --  41.9* 48.5*  INR 4.5*  --   --  4.4* 5.3*  CREATININE 6.22*   < > 6.81* 7.73* 5.04*   < > = values in this interval not displayed.    Estimated Creatinine Clearance: 7 mL/min (A) (by C-G formula based on SCr of 5.04 mg/dL (H)).   Assessment: 86 yo W on warfarin for afib (CHADS2VASc = 7). Followed by Dr. Claris Gower [facility], [phone], who's staff said patient was last seen 06/11/19 and instructed to take 6mg -3mg -3mg -3mg  with follow up on 06/25/19, which the patient missed. Per patient, she's taking warfarin 6mg -6mg -3mg -3mg -3mg -6mg . Unclear from both sources which days of the week are 3mg  vs 6mg . Confirmed she took warfarin on 07/03/19 before admission. D-dimer < 5.   INR up to 5.3 today, remains supratherapeutic. Poor PO at least 3 days before admission, and remains poor.  Last warfarin dose 1/12 PTA.  PTA Warfarin: 3-6mg , unclear exact dose but likely 3mg  for 3-4 days/wk and 6mg  all other days.  Goal of Therapy:  INR 2-3 Monitor platelets by anticoagulation protocol: Yes   Plan:  Hold Warfarin again today. Daily PT/INR Monitor for bleeding.   Arty Baumgartner, [place] Phone: [phone] 07/11/2019 8:48 AM

## 2019-07-11 NOTE — Progress Notes (Signed)
PROGRESS NOTE    Patricia Randall  [id number] DOB: 03-05-1929 DOA: 07/04/2019 PCP: Leonard Downing, MD   Brief Narrative:  86 year old female patient with PMH of atrial fibrillation on Warfarin, chronic systolic CHF, LVEF [id number] by TTE 09/27/2017, hyperlipidemia, hypertension, type II DM, stage IV CKD (baseline creatinine ~1.8), presented to the ER due to increasing fatigue, weakness, poor appetite, decreased oral intake, decreased mobility and laying in the bed all the time for the last 3 days.  Transiently in A. fib with RVR in the ED, improved after IV fluids.  Admitted for acute on chronic kidney disease due to COVID-19 infection with associated poor oral intake and dehydration.  Despite IV hydration, acute kidney injury continues to worsen, hyperkalemia, metabolic acidosis, suspect some uremia and volume overloaded. Patient/family open for short-term HD as per Nephrologist discussion with them on the weekend but patient not a long-term HD candidate.  HD initiated 07/10/2019.   Assessment & Plan:   Principal Problem:   COVID-19 virus infection Active Problems:   Permanent atrial fibrillation (HCC)   Cardiomyopathy ([place])   Essential hypertension, benign   Type II diabetes mellitus ([place])   DM (diabetes mellitus), type 2 with renal complications ([place])   AKI (acute kidney injury) ([place])   ESRD (end stage renal disease) ([place])  ESRD on hemodialysis, new diagnosis History of CKD stage V, progressively worsening Metabolic derangement -HD catheter placed by IR 07/09/2018.  HD started per nephrology team.  Suspect electrolytes will improve slowly with dialysis. -Appears patient is not a good candidate for long-term dialysis -Sodium bicarb supplements twice daily  COVID-19 infection -Completed remdesivir 1/18.  Currently on room air.  Supportive care.  Paroxysmal atrial fibrillation Intermittent pause, resolved -Digoxin discontinued.  Toprol-XL reduced to 50 mg daily -Continue Coumadin.   Pharmacy to dose.  Chronic congestive heart failure with reduced ejection fraction, 30% -Currently on dialysis.  Slightly volume overloaded but should improve.  No acute signs of decompensation therefore we will closely monitor  Essential hypertension -Norvasc 10 mg daily. -Toprol-XL 50 mg daily  Hyperlipidemia -Continue statin  Diabetes mellitus type 2 with renal complications -Currently uncontrolled due to being on steroids.  Continue insulin sliding scale and Accu-Chek.  Adjust as necessary    DVT prophylaxis: Coumadin Code Status: Full code Family Communication: None Disposition Plan: Currently patient is not stable to be discharged.  She needs to be dialyzed inpatient at discretion of nephrology to see how she tolerates it.  Long-term disposition planning will be made accordingly  Consultants:   Nephrology  Procedures:   Dialysis catheter placement 1/19  Antimicrobials:   None   Subjective: She tolerated her dialysis yesterday, she feels okay this morning.  She tells me it is not as bad as it thought it would be.  Review of Systems Otherwise negative except as per HPI, including: General: Denies fever, chills, night sweats or unintended weight loss. Resp: Denies cough, wheezing, shortness of breath. Cardiac: Denies chest pain, palpitations, orthopnea, paroxysmal nocturnal dyspnea. GI: Denies abdominal pain, nausea, vomiting, diarrhea or constipation GU: Denies dysuria, frequency, hesitancy or incontinence MS: Denies muscle aches, joint pain or swelling Neuro: Denies headache, neurologic deficits (focal weakness, numbness, tingling), abnormal gait Psych: Denies anxiety, depression, SI/HI/AVH Skin: Denies new rashes or lesions ID: Denies sick contacts, exotic exposures, travel  Objective: Vitals:   07/11/19 0330 07/11/19 0414 07/11/19 0432 07/11/19 0843  BP: (!) 105/41 (!) 138/47  115/85  Pulse: 67 73  61  Resp:  19  16  Temp:  (!)  97 F (36.1 C) (!) 97.5  F (36.4 C)   TempSrc:  Axillary Oral   SpO2:    96%  Weight:  69.3 kg    Height:        Intake/Output Summary (Last 24 hours) at 07/11/2019 1209 Last data filed at 07/11/2019 0405 Gross per 24 hour  Intake 280 ml  Output 0 ml  Net 280 ml   Filed Weights   07/10/19 1710 07/11/19 0100 07/11/19 0414  Weight: 74 kg 69.3 kg 69.3 kg    Examination:  General exam: Appears calm and comfortable, elderly frail Respiratory system: Clear to auscultation. Respiratory effort normal. Cardiovascular system: S1 & S2 heard, RRR. No JVD, murmurs, rubs, gallops or clicks. No pedal edema. Gastrointestinal system: Abdomen is nondistended, soft and nontender. No organomegaly or masses felt. Normal bowel sounds heard. Central nervous system: Alert and oriented. No focal neurological deficits. Extremities: Symmetric 4 x 5 power. Skin: No rashes, lesions or ulcers Psychiatry: Judgement and insight appear normal. Mood & affect appropriate.  Dialysis catheter noted in her right neck area without any evidence of active bleeding.   Data Reviewed:   CBC: Recent Labs  Lab 07/05/19 0614 07/05/19 0614 07/06/19 0149 07/07/19 0504 07/08/19 0532 07/09/19 0328 07/10/19 0645  WBC 4.4   < > 3.6* 3.6* 4.5 5.5 5.0  NEUTROABS 3.4  --   --   --   --   --   --   HGB 15.6*   < > 15.0 14.9 15.5* 16.2* 15.8*  HCT 48.5*   < > 47.0* 45.5 47.5* 49.2* 47.5*  MCV 86.8   < > 87.4 86.8 86.1 85.6 83.6  PLT 233   < > 203 220 258 289 291   < > = values in this interval not displayed.   Basic Metabolic Panel: Recent Labs  Lab 07/05/19 0614 07/06/19 0149 07/08/19 0532 07/09/19 0328 07/09/19 1608 07/10/19 0645 07/11/19 0512  NA 138   < > 143 141 140 143 138  K 4.8   < > 4.7 6.1* 4.6 4.7 3.5  CL 98   < > 108 109 107 109 101  CO2 22   < > 19* 14* 16* 17* 20*  GLUCOSE 183*   < > 157* 206* 156* 130* 108*  BUN 63*   < > 98* 114* 121* 130* 60*  CREATININE 3.58*   < > 5.02* 6.22* 6.81* 7.73* 5.04*  CALCIUM 8.1*   <  > 7.6* 7.5* 7.4* 7.4* 7.3*  MG 1.8  --   --   --   --   --   --   PHOS  --   --   --  8.4*  --  7.9* 4.8*   < > = values in this interval not displayed.   GFR: Estimated Creatinine Clearance: 7 mL/min (A) (by C-G formula based on SCr of 5.04 mg/dL (H)). Liver Function Tests: Recent Labs  Lab 07/05/19 0017 07/05/19 0017 07/05/19 [id number] 07/05/19 [id number] 07/06/19 0149 07/07/19 0504 07/09/19 0328 07/10/19 0645 07/11/19 0512  AST 27  --  24  --  31 26 29   --   --   ALT 15  --  15  --  17 14 13   --   --   ALKPHOS 65  --  61  --  57 57 62  --   --   BILITOT 0.9  --  1.2  --  0.8 0.9 1.2  --   --   PROT 6.7  --  6.0*  --  5.7* 5.0* 5.5*  --   --   ALBUMIN 2.3*   < > 2.1*   < > 2.0* 1.7* 1.7* 1.6* 1.6*   < > = values in this interval not displayed.   Recent Labs  Lab 07/05/19 0017  LIPASE 25   No results for input(s): AMMONIA in the last 168 hours. Coagulation Profile: Recent Labs  Lab 07/07/19 0504 07/08/19 0532 07/09/19 0328 07/10/19 0645 07/11/19 0512  INR 3.1* 3.9* 4.5* 4.4* 5.3*   Cardiac Enzymes: No results for input(s): CKTOTAL, CKMB, CKMBINDEX, TROPONINI in the last 168 hours. BNP (last 3 results) No results for input(s): PROBNP in the last 8760 hours. HbA1C: No results for input(s): HGBA1C in the last 72 hours. CBG: Recent Labs  Lab 07/09/19 2117 07/10/19 0805 07/10/19 1219 07/10/19 2038 07/11/19 0805  GLUCAP 116* 133* 143* 127* 79   Lipid Profile: No results for input(s): CHOL, HDL, LDLCALC, TRIG, CHOLHDL, LDLDIRECT in the last 72 hours. Thyroid Function Tests: No results for input(s): TSH, T4TOTAL, FREET4, T3FREE, THYROIDAB in the last 72 hours. Anemia Panel: No results for input(s): VITAMINB12, FOLATE, FERRITIN, TIBC, IRON, RETICCTPCT in the last 72 hours. Sepsis Labs: Recent Labs  Lab 07/05/19 0614  PROCALCITON 0.19    Recent Results (from the past 240 hour(s))  Urine culture     Status: Abnormal   Collection Time: 07/06/19  8:23 PM    Specimen: Urine, Clean Catch  Result Value Ref Range Status   Specimen Description URINE, CLEAN CATCH  Final   Special Requests   Final    NONE Performed at [facility], [place] [address], [place], [place] [address]    Culture MULTIPLE SPECIES PRESENT, SUGGEST RECOLLECTION (A)  Final   Report Status 07/08/2019 FINAL  Final         Radiology Studies: IR Fluoro Guide CV Line Right  Result Date: 07/11/2019 CLINICAL DATA:  Renal failure. COVID positive. Needs access for hemodialysis EXAM: EXAM RIGHT IJ CATHETER PLACEMENT UNDER ULTRASOUND AND FLUOROSCOPIC GUIDANCE TECHNIQUE: The procedure, risks (including but not limited to bleeding, infection, organ damage, pneumothorax), benefits, and alternatives were explained to the patient. Questions regarding the procedure were encouraged and answered. The patient understands and consents to the procedure. Patency of the right IJ vein was confirmed with ultrasound with image documentation. An appropriate skin site was determined. Skin site was marked. Region was prepped using maximum barrier technique including cap and mask, sterile gown, sterile gloves, large sterile sheet, and Chlorhexidine as cutaneous antisepsis. The region was infiltrated locally with 1% lidocaine. Under real-time ultrasound guidance, the right IJ vein was accessed with a 21 gauge needle; the needle tip within the vein was confirmed with ultrasound image documentation. The needle exchanged over a 018 guidewire for vascular dilator which allowed advancement of a 16 cm Mahurkar catheter. This was positioned with the tip at the cavoatrial junction. Spot chest radiograph shows good positioning and no pneumothorax. Catheter was flushed and sutured externally with 0-Prolene sutures. Patient tolerated the procedure well. FLUOROSCOPY TIME:  0.2 minutes; 12 uGym2 DAP COMPLICATIONS: COMPLICATIONS none IMPRESSION: 1. Technically successful right IJ Mahurkar catheter placement. Electronically  Signed   By: Lucrezia Europe M.D.   On: 07/11/2019 07:13   IR US Guide Vasc Access Right  Result Date: 07/11/2019 CLINICAL DATA:  Renal failure. COVID positive. Needs access for hemodialysis EXAM: EXAM RIGHT IJ CATHETER PLACEMENT UNDER ULTRASOUND AND FLUOROSCOPIC GUIDANCE TECHNIQUE: The procedure, risks (including but not limited to bleeding,  infection, organ damage, pneumothorax), benefits, and alternatives were explained to the patient. Questions regarding the procedure were encouraged and answered. The patient understands and consents to the procedure. Patency of the right IJ vein was confirmed with ultrasound with image documentation. An appropriate skin site was determined. Skin site was marked. Region was prepped using maximum barrier technique including cap and mask, sterile gown, sterile gloves, large sterile sheet, and Chlorhexidine as cutaneous antisepsis. The region was infiltrated locally with 1% lidocaine. Under real-time ultrasound guidance, the right IJ vein was accessed with a 21 gauge needle; the needle tip within the vein was confirmed with ultrasound image documentation. The needle exchanged over a 018 guidewire for vascular dilator which allowed advancement of a 16 cm Mahurkar catheter. This was positioned with the tip at the cavoatrial junction. Spot chest radiograph shows good positioning and no pneumothorax. Catheter was flushed and sutured externally with 0-Prolene sutures. Patient tolerated the procedure well. FLUOROSCOPY TIME:  0.2 minutes; 12 uGym2 DAP COMPLICATIONS: COMPLICATIONS none IMPRESSION: 1. Technically successful right IJ Mahurkar catheter placement. Electronically Signed   By: Lucrezia Europe M.D.   On: 07/11/2019 07:13        Scheduled Meds: . amLODipine  10 mg Oral Daily  . Chlorhexidine Gluconate Cloth  6 each Topical Q0600  . feeding supplement (ENSURE ENLIVE)  237 mL Oral BID BM  . insulin aspart  0-9 Units Subcutaneous TID WC  . latanoprost  1 drop Both Eyes QHS  .  metoprolol succinate  50 mg Oral Daily  . pravastatin  40 mg Oral q1800  . sodium bicarbonate  650 mg Oral BID  . Warfarin - Pharmacist Dosing Inpatient   Does not apply q1800   Continuous Infusions: . sodium chloride    . sodium chloride       LOS: 6 days   Time spent= 35 mins    Gianluca Chhim Arsenio Loader, MD [facility]  If 7PM-7AM, please contact night-coverage  07/11/2019, 12:09 PM

## 2019-07-11 NOTE — Progress Notes (Signed)
Nutrition Follow-up  RD working remotely.  DOCUMENTATION CODES:   Not applicable  INTERVENTION:   Recommend placement of Cortrak feeding tube and initiation of enteral nutrition. Cortrak team is available on Tuesdays and Fridays. Recommend: - Nepro @ 35 ml/hr (840 ml/day) - Pro-stat 30 ml daily  Recommended tube feeding regimen provides 1612 kcal, 83 grams of protein, and 611 ml of H2O.  - Nepro Shake po TID, each supplement provides 425 kcal and 19 grams protein  NUTRITION DIAGNOSIS:   Increased nutrient needs related to acute illness, catabolic illness ([id number]) as evidenced by estimated needs.  Ongoing  GOAL:   Patient will meet greater than or equal to 90% of their needs  Unmet at this time  MONITOR:   PO intake, Supplement acceptance, Labs, Weight trends  REASON FOR ASSESSMENT:   Malnutrition Screening Tool    ASSESSMENT:   86 year old female who presented to the ED on 1/13 with loss of appetite and weakness. PMH of CHF, atrial fibrillation, CKD stage IV, T2DM. Pt tested positive for COVID-19.  01/19 - HD catheter placed, HD initiated  First HD was yesterday evening with 0 ml net UF. Post-HD weight was 69.3 kg. Next HD planned for 1/21. Per notes, pt is not a good candidate for long-term dialysis.  Overall, pt's weight is 6 lbs above admission weight. Suspect this is related to fluid status. Will continue to monitor trends. Per RN edema assessment, pt with moderate pitting generalized edema, moderate pitting edema to BUE, and mild pitting edema to BLE.  Pt refusing most Ensure Enlive supplements. RD will switch supplement to Nepro now that pt is on HD.  Unable to reach pt via phone call to room. Someone answered phone then promptly hung up.  Given continued poor PO intake and supplement refusal, recommend Cortrak placement and tube feeds. Discussed with MD who will discuss with family.  Meal Completion: 0-25% x last 8 recorded meals  Medications reviewed  and include: Ensure Enlive BID, SSI, sodium bicarb, warfarin  Labs reviewed: phosphorus 4.8 CBG's: 79-127 x 24 hours  Diet Order:   Diet Order            Diet renal/carb modified with fluid restriction Diet-HS Snack? Nothing; Fluid restriction: 1200 mL Fluid; Room service appropriate? Yes; Fluid consistency: Thin  Diet effective now              EDUCATION NEEDS:   Education needs have been addressed  Skin:  Skin Assessment: Reviewed RN Assessment  Last BM:  07/07/19  Height:   Ht Readings from Last 1 Encounters:  07/05/19 5\' 2"  (1.575 m)    Weight:   Wt Readings from Last 1 Encounters:  07/11/19 69.3 kg    Ideal Body Weight:  50 kg  BMI:  Body mass index is 27.94 kg/m.  Estimated Nutritional Needs:   Kcal:  1600-1800  Protein:  80-95 grams  Fluid:  1000 ml + UOP    Gaynell Face, MS, RD, LDN Inpatient Clinical Dietitian Pager: [phone] Weekend/After Hours: [phone]

## 2019-07-12 LAB — GLUCOSE, CAPILLARY
Glucose-Capillary: 146 mg/dL — ABNORMAL HIGH (ref 70–99)
Glucose-Capillary: 155 mg/dL — ABNORMAL HIGH (ref 70–99)
Glucose-Capillary: 129 mg/dL — ABNORMAL HIGH (ref 70–99)

## 2019-07-12 LAB — PROTIME-INR
INR: 5 (ref 0.8–1.2)
Prothrombin Time: 46.8 seconds — ABNORMAL HIGH (ref 11.4–15.2)

## 2019-07-12 LAB — CBC
HCT: 44 % (ref 36.0–46.0)
Hemoglobin: 14.8 g/dL (ref 12.0–15.0)
MCH: 28 pg (ref 26.0–34.0)
MCHC: 33.6 g/dL (ref 30.0–36.0)
MCV: 83.3 fL (ref 80.0–100.0)
Platelets: 304 10*3/uL (ref 150–400)
RBC: 5.28 MIL/uL — ABNORMAL HIGH (ref 3.87–5.11)
RDW: 16 % — ABNORMAL HIGH (ref 11.5–15.5)
WBC: 5.8 10*3/uL (ref 4.0–10.5)
nRBC: 0 % (ref 0.0–0.2)

## 2019-07-12 LAB — RENAL FUNCTION PANEL
Albumin: 1.6 g/dL — ABNORMAL LOW (ref 3.5–5.0)
Anion gap: 22 — ABNORMAL HIGH (ref 5–15)
BUN: 84 mg/dL — ABNORMAL HIGH (ref 8–23)
CO2: 19 mmol/L — ABNORMAL LOW (ref 22–32)
Calcium: 7.1 mg/dL — ABNORMAL LOW (ref 8.9–10.3)
Chloride: 100 mmol/L (ref 98–111)
Creatinine, Ser: 7.26 mg/dL — ABNORMAL HIGH (ref 0.44–1.00)
GFR calc Af Amer: 5 mL/min — ABNORMAL LOW (ref >60)
GFR calc non Af Amer: 5 mL/min — ABNORMAL LOW (ref >60)
Glucose, Bld: 162 mg/dL — ABNORMAL HIGH (ref 70–99)
Phosphorus: 9.2 mg/dL — ABNORMAL HIGH (ref 2.5–4.6)
Potassium: 4.3 mmol/L (ref 3.5–5.1)
Sodium: 141 mmol/L (ref 135–145)

## 2019-07-12 LAB — MAGNESIUM: Magnesium: 2.1 mg/dL (ref 1.7–2.4)

## 2019-07-12 NOTE — Procedures (Signed)
I was present at this dialysis session. I have reviewed the session itself and made appropriate changes.    Does not toelrate any UF at all, immediate drop in BP and she becomes symptomatic.  Labs do not suggest any GFR recovery. No reported UOP.    I think after today will follow labs, given her relative intolerance of HD might need to move to palliative approach.    Filed Weights   07/10/19 1710 07/11/19 0100 07/11/19 0414  Weight: 74 kg 69.3 kg 69.3 kg    Recent Labs  Lab 07/12/19 0352  NA 141  K 4.3  CL 100  CO2 19*  GLUCOSE 162*  BUN 84*  CREATININE 7.26*  CALCIUM 7.1*  PHOS 9.2*    Recent Labs  Lab 07/09/19 0328 07/10/19 0645 07/12/19 0352  WBC 5.5 5.0 5.8  HGB 16.2* 15.8* 14.8  HCT 49.2* 47.5* 44.0  MCV 85.6 83.6 83.3  PLT 289 291 304    Scheduled Meds: . amLODipine  10 mg Oral Daily  . Chlorhexidine Gluconate Cloth  6 each Topical Q0600  . feeding supplement (NEPRO CARB STEADY)  237 mL Oral TID BM  . insulin aspart  0-9 Units Subcutaneous TID WC  . latanoprost  1 drop Both Eyes QHS  . metoprolol succinate  50 mg Oral Daily  . pravastatin  40 mg Oral q1800  . sodium bicarbonate  650 mg Oral BID  . sodium chloride flush  10-40 mL Intracatheter Q12H  . Warfarin - Pharmacist Dosing Inpatient   Does not apply q1800   Continuous Infusions: . sodium chloride    . sodium chloride     PRN Meds:.sodium chloride, sodium chloride, acetaminophen **OR** acetaminophen, alteplase, heparin, hydrALAZINE, Ipratropium-Albuterol, lidocaine, ondansetron **OR** ondansetron (ZOFRAN) IV, sodium chloride flush   Pearson Grippe  MD 07/12/2019, 12:05 PM

## 2019-07-12 NOTE — Progress Notes (Signed)
PROGRESS NOTE    Patricia Randall  [id number] DOB: 1928-12-14 DOA: 07/04/2019 PCP: Leonard Downing, MD   Brief Narrative:  86 year old female patient with PMH of atrial fibrillation on Warfarin, chronic systolic CHF, LVEF [id number] by TTE 09/27/2017, hyperlipidemia, hypertension, type II DM, stage IV CKD (baseline creatinine ~1.8), presented to the ER due to increasing fatigue, weakness, poor appetite, decreased oral intake, decreased mobility and laying in the bed all the time for the last 3 days.  Transiently in A. fib with RVR in the ED, improved after IV fluids.  Admitted for acute on chronic kidney disease due to COVID-19 infection with associated poor oral intake and dehydration.  Despite IV hydration, acute kidney injury continues to worsen, hyperkalemia, metabolic acidosis, suspect some uremia and volume overloaded. Patient/family open for short-term HD as per Nephrologist discussion with them on the weekend but patient not a long-term HD candidate.  HD initiated 07/10/2019.  Intermittently having a difficult time tolerating hemodialysis and also poor p.o. intake.  Palliative care team consulted.   Assessment & Plan:   Principal Problem:   COVID-19 virus infection Active Problems:   Permanent atrial fibrillation (HCC)   Cardiomyopathy ([place])   Essential hypertension, benign   Type II diabetes mellitus ([place])   DM (diabetes mellitus), type 2 with renal complications ([place])   AKI (acute kidney injury) ([place])   ESRD (end stage renal disease) ([place])  ESRD on hemodialysis, new diagnosis History of CKD stage V, progressively worsening Metabolic derangement -HD catheter placed by IR 07/09/2018.  For session dialysis 07/09/2018.  Having difficult time tolerating dialysis.  Hold a.m. blood pressure medications on the day of dialysis. -Appears patient is not a good candidate for long-term dialysis -Sodium bicarb twice daily  COVID-19 infection -Completed remdesivir 1/18.  Currently on room air.   Continue supportive care  Paroxysmal atrial fibrillation Intermittent pause, resolved -Digoxin discontinued.  Toprol-XL reduced to 50 mg daily -Coumadin, pharmacy to dose  Chronic congestive heart failure with reduced ejection fraction, 30% -Currently on dialysis.  Slightly volume overloaded but should improve if she tolerates dialysis.  No acute signs of decompensation therefore we will closely monitor  Essential hypertension -Norvasc 10 mg daily; Toprol-XL 50 mg daily.  Plan to hold these medications predialysis  Hyperlipidemia -Continue statin  Diabetes mellitus type 2 with renal complications -Currently uncontrolled due to being on steroids.  Continue insulin sliding scale and Accu-Chek.  Adjust as necessary  Goals of care discussion -Will consult palliative care service to help establish goals of care as patient is having difficult time tolerating dialysis and poor nutritional intake. Have attempted to call her son but no answer.    DVT prophylaxis: Coumadin Code Status: Full code Family Communication: Alethia Berthold, no answer Disposition Plan: Currently having difficult time tolerating dialysis and poor oral intake.  Will consult palliative care services  Consultants:   Nephrology  Procedures:   Dialysis catheter placement 1/19  Antimicrobials:   None   Subjective: Overall no complaints.  Having difficult time tolerating dialysis due to low blood pressure while on HD.  Review of Systems Otherwise negative except as per HPI, including: General = no fevers, chills, dizziness, malaise, fatigue HEENT/EYES = negative for pain, redness, loss of vision, double vision, blurred vision, loss of hearing, sore throat, hoarseness, dysphagia Cardiovascular= negative for chest pain, palpitation, murmurs, lower extremity swelling Respiratory/lungs= negative for shortness of breath, cough, hemoptysis, wheezing, mucus production Gastrointestinal= negative for nausea, vomiting,,  abdominal pain, melena, hematemesis Genitourinary= negative for Dysuria, Hematuria,  Change in Urinary Frequency MSK = Negative for arthralgia, myalgias, Back Pain, Joint swelling  Neurology= Negative for headache, seizures, numbness, tingling  Psychiatry= Negative for anxiety, depression, suicidal and homocidal ideation Allergy/Immunology= Medication/Food allergy as listed  Skin= Negative for Rash, lesions, ulcers, itching   Objective: Vitals:   07/12/19 1100 07/12/19 1130 07/12/19 1202 07/12/19 1247  BP: (!) 100/57 112/71 110/63 (!) 130/107  Pulse: (!) 57 65 (!) 57 63  Resp:   18 17  Temp:    98.6 F (37 C)  TempSrc:   Axillary Oral  SpO2:    100%  Weight:      Height:        Intake/Output Summary (Last 24 hours) at 07/12/2019 1417 Last data filed at 07/12/2019 1142 Gross per 24 hour  Intake 440 ml  Output -179 ml  Net 619 ml   Filed Weights   07/10/19 1710 07/11/19 0100 07/11/19 0414  Weight: 74 kg 69.3 kg 69.3 kg    Examination:  Constitutional: Cachectic elderly frail Respiratory: Clear to auscultation bilaterally Cardiovascular: Normal sinus rhythm, no rubs Abdomen: Nontender nondistended good bowel sounds Musculoskeletal: No edema noted Skin: No rashes seen Neurologic: CN 2-12 grossly intact.  And nonfocal Psychiatric: Somewhat normal judgment and insight. Alert and oriented x 3. Normal mood.    Dialysis catheter noted in her right neck area without any evidence of active bleeding.   Data Reviewed:   CBC: Recent Labs  Lab 07/07/19 0504 07/08/19 0532 07/09/19 0328 07/10/19 0645 07/12/19 0352  WBC 3.6* 4.5 5.5 5.0 5.8  HGB 14.9 15.5* 16.2* 15.8* 14.8  HCT 45.5 47.5* 49.2* 47.5* 44.0  MCV 86.8 86.1 85.6 83.6 83.3  PLT 220 258 289 291 [id number]   Basic Metabolic Panel: Recent Labs  Lab 07/09/19 0328 07/09/19 1608 07/10/19 0645 07/11/19 0512 07/12/19 0352  NA 141 140 143 138 141  K 6.1* 4.6 4.7 3.5 4.3  CL 109 107 109 101 100  CO2 14* 16* 17* 20*  19*  GLUCOSE 206* 156* 130* 108* 162*  BUN 114* 121* 130* 60* 84*  CREATININE 6.22* 6.81* 7.73* 5.04* 7.26*  CALCIUM 7.5* 7.4* 7.4* 7.3* 7.1*  MG  --   --   --   --  2.1  PHOS 8.4*  --  7.9* 4.8* 9.2*   GFR: Estimated Creatinine Clearance: 4.9 mL/min (A) (by C-G formula based on SCr of 7.26 mg/dL (H)). Liver Function Tests: Recent Labs  Lab 07/06/19 0149 07/06/19 0149 07/07/19 0504 07/09/19 0328 07/10/19 0645 07/11/19 0512 07/12/19 0352  AST 31  --  26 29  --   --   --   ALT 17  --  14 13  --   --   --   ALKPHOS 57  --  57 62  --   --   --   BILITOT 0.8  --  0.9 1.2  --   --   --   PROT 5.7*  --  5.0* 5.5*  --   --   --   ALBUMIN 2.0*   < > 1.7* 1.7* 1.6* 1.6* 1.6*   < > = values in this interval not displayed.   No results for input(s): LIPASE, AMYLASE in the last 168 hours. No results for input(s): AMMONIA in the last 168 hours. Coagulation Profile: Recent Labs  Lab 07/08/19 0532 07/09/19 0328 07/10/19 0645 07/11/19 0512 07/12/19 0352  INR 3.9* 4.5* 4.4* 5.3* 5.0*   Cardiac Enzymes: No results for input(s): CKTOTAL, CKMB, CKMBINDEX,  TROPONINI in the last 168 hours. BNP (last 3 results) No results for input(s): PROBNP in the last 8760 hours. HbA1C: No results for input(s): HGBA1C in the last 72 hours. CBG: Recent Labs  Lab 07/11/19 1228 07/11/19 1646 07/11/19 2330 07/12/19 0628 07/12/19 0759  GLUCAP 94 137* 180* 146* 155*   Lipid Profile: No results for input(s): CHOL, HDL, LDLCALC, TRIG, CHOLHDL, LDLDIRECT in the last 72 hours. Thyroid Function Tests: No results for input(s): TSH, T4TOTAL, FREET4, T3FREE, THYROIDAB in the last 72 hours. Anemia Panel: No results for input(s): VITAMINB12, FOLATE, FERRITIN, TIBC, IRON, RETICCTPCT in the last 72 hours. Sepsis Labs: No results for input(s): PROCALCITON, LATICACIDVEN in the last 168 hours.  Recent Results (from the past 240 hour(s))  Urine culture     Status: Abnormal   Collection Time: 07/06/19  8:23 PM    Specimen: Urine, Clean Catch  Result Value Ref Range Status   Specimen Description URINE, CLEAN CATCH  Final   Special Requests   Final    NONE Performed at [facility], [address] [place], [place] [address]    Culture MULTIPLE SPECIES PRESENT, SUGGEST RECOLLECTION (A)  Final   Report Status 07/08/2019 FINAL  Final         Radiology Studies: IR Fluoro Guide CV Line Right  Result Date: 07/11/2019 CLINICAL DATA:  Renal failure. COVID positive. Needs access for hemodialysis EXAM: EXAM RIGHT IJ CATHETER PLACEMENT UNDER ULTRASOUND AND FLUOROSCOPIC GUIDANCE TECHNIQUE: The procedure, risks (including but not limited to bleeding, infection, organ damage, pneumothorax), benefits, and alternatives were explained to the patient. Questions regarding the procedure were encouraged and answered. The patient understands and consents to the procedure. Patency of the right IJ vein was confirmed with ultrasound with image documentation. An appropriate skin site was determined. Skin site was marked. Region was prepped using maximum barrier technique including cap and mask, sterile gown, sterile gloves, large sterile sheet, and Chlorhexidine as cutaneous antisepsis. The region was infiltrated locally with 1% lidocaine. Under real-time ultrasound guidance, the right IJ vein was accessed with a 21 gauge needle; the needle tip within the vein was confirmed with ultrasound image documentation. The needle exchanged over a 018 guidewire for vascular dilator which allowed advancement of a 16 cm Mahurkar catheter. This was positioned with the tip at the cavoatrial junction. Spot chest radiograph shows good positioning and no pneumothorax. Catheter was flushed and sutured externally with 0-Prolene sutures. Patient tolerated the procedure well. FLUOROSCOPY TIME:  0.2 minutes; 12 uGym2 DAP COMPLICATIONS: COMPLICATIONS none IMPRESSION: 1. Technically successful right IJ Mahurkar catheter placement. Electronically  Signed   By: Lucrezia Europe M.D.   On: 07/11/2019 07:13   IR US Guide Vasc Access Right  Result Date: 07/11/2019 CLINICAL DATA:  Renal failure. COVID positive. Needs access for hemodialysis EXAM: EXAM RIGHT IJ CATHETER PLACEMENT UNDER ULTRASOUND AND FLUOROSCOPIC GUIDANCE TECHNIQUE: The procedure, risks (including but not limited to bleeding, infection, organ damage, pneumothorax), benefits, and alternatives were explained to the patient. Questions regarding the procedure were encouraged and answered. The patient understands and consents to the procedure. Patency of the right IJ vein was confirmed with ultrasound with image documentation. An appropriate skin site was determined. Skin site was marked. Region was prepped using maximum barrier technique including cap and mask, sterile gown, sterile gloves, large sterile sheet, and Chlorhexidine as cutaneous antisepsis. The region was infiltrated locally with 1% lidocaine. Under real-time ultrasound guidance, the right IJ vein was accessed with a 21 gauge needle; the  needle tip within the vein was confirmed with ultrasound image documentation. The needle exchanged over a 018 guidewire for vascular dilator which allowed advancement of a 16 cm Mahurkar catheter. This was positioned with the tip at the cavoatrial junction. Spot chest radiograph shows good positioning and no pneumothorax. Catheter was flushed and sutured externally with 0-Prolene sutures. Patient tolerated the procedure well. FLUOROSCOPY TIME:  0.2 minutes; 12 uGym2 DAP COMPLICATIONS: COMPLICATIONS none IMPRESSION: 1. Technically successful right IJ Mahurkar catheter placement. Electronically Signed   By: Lucrezia Europe M.D.   On: 07/11/2019 07:13        Scheduled Meds: . amLODipine  10 mg Oral Daily  . Chlorhexidine Gluconate Cloth  6 each Topical Q0600  . feeding supplement (NEPRO CARB STEADY)  237 mL Oral TID BM  . insulin aspart  0-9 Units Subcutaneous TID WC  . latanoprost  1 drop Both Eyes QHS    . metoprolol succinate  50 mg Oral Daily  . pravastatin  40 mg Oral q1800  . sodium bicarbonate  650 mg Oral BID  . sodium chloride flush  10-40 mL Intracatheter Q12H  . Warfarin - Pharmacist Dosing Inpatient   Does not apply q1800   Continuous Infusions: . sodium chloride    . sodium chloride       LOS: 7 days   Time spent= 35 mins    Gavin Telford Arsenio Loader, MD [facility]  If 7PM-7AM, please contact night-coverage  07/12/2019, 2:17 PM

## 2019-07-12 NOTE — Progress Notes (Signed)
ANTICOAGULATION CONSULT NOTE - Follow up Pharmacy Consult for Warfarin Indication: atrial fibrillation  No Known Allergies  Patient Measurements: Height: 5\' 2"  (157.5 cm) Weight: 152 lb 12.5 oz (69.3 kg) IBW/kg (Calculated) : 50.1   Vital Signs: Temp: 98.4 F (36.9 C) (01/21 0615) Temp Source: Oral (01/21 0615) BP: 133/81 (01/21 0615) Pulse Rate: 44 (01/21 0615)  Labs: Recent Labs    07/10/19 0645 07/11/19 0512 07/12/19 0352  HGB 15.8*  --  14.8  HCT 47.5*  --  44.0  PLT 291  --  304  LABPROT 41.9* 48.5* 46.8*  INR 4.4* 5.3* 5.0*  CREATININE 7.73* 5.04* 7.26*    Estimated Creatinine Clearance: 4.9 mL/min (A) (by C-G formula based on SCr of 7.26 mg/dL (H)).   Assessment: 86 yo W on warfarin for afib (CHADS2VASc = 7). Followed by Dr. Claris Gower [facility], [phone], who's staff said patient was last seen 06/11/19 and instructed to take 6mg -3mg -3mg -3mg  with follow up on 06/25/19, which the patient missed. Per patient, she's taking warfarin 6mg -6mg -3mg -3mg -3mg -6mg . Unclear from both sources which days of the week are 3mg  vs 6mg . Confirmed she took warfarin on 07/03/19 before admission. D-dimer < 5.   INR 5.0, remains supratherapeutic but no further rise. Poor PO at least 3 days before admission, and remains poor.  Last warfarin dose 1/12 PTA.  PTA Warfarin: 3-6mg , unclear exact dose but likely 3mg  for 3-4 days/wk and 6mg  all other days.  Goal of Therapy:  INR 2-3 Monitor platelets by anticoagulation protocol: Yes   Plan:  Hold Warfarin again today. Daily PT/INR Monitor for bleeding.   Arty Baumgartner, [place] Phone: [phone] 07/12/2019 9:37 AM

## 2019-07-13 LAB — GLUCOSE, CAPILLARY
Glucose-Capillary: 106 mg/dL — ABNORMAL HIGH (ref 70–99)
Glucose-Capillary: 118 mg/dL — ABNORMAL HIGH (ref 70–99)
Glucose-Capillary: 149 mg/dL — ABNORMAL HIGH (ref 70–99)
Glucose-Capillary: 186 mg/dL — ABNORMAL HIGH (ref 70–99)
Glucose-Capillary: 231 mg/dL — ABNORMAL HIGH (ref 70–99)

## 2019-07-13 LAB — CBC
HCT: 41.6 % (ref 36.0–46.0)
Hemoglobin: 14.1 g/dL (ref 12.0–15.0)
MCH: 28 pg (ref 26.0–34.0)
MCHC: 33.9 g/dL (ref 30.0–36.0)
MCV: 82.7 fL (ref 80.0–100.0)
Platelets: 251 10*3/uL (ref 150–400)
RBC: 5.03 MIL/uL (ref 3.87–5.11)
RDW: 15.8 % — ABNORMAL HIGH (ref 11.5–15.5)
WBC: 5 10*3/uL (ref 4.0–10.5)
nRBC: 0 % (ref 0.0–0.2)

## 2019-07-13 LAB — RENAL FUNCTION PANEL
Albumin: 1.5 g/dL — ABNORMAL LOW (ref 3.5–5.0)
Anion gap: 16 — ABNORMAL HIGH (ref 5–15)
BUN: 53 mg/dL — ABNORMAL HIGH (ref 8–23)
CO2: 23 mmol/L (ref 22–32)
Calcium: 7 mg/dL — ABNORMAL LOW (ref 8.9–10.3)
Chloride: 98 mmol/L (ref 98–111)
Creatinine, Ser: 6.33 mg/dL — ABNORMAL HIGH (ref 0.44–1.00)
GFR calc Af Amer: 6 mL/min — ABNORMAL LOW (ref >60)
GFR calc non Af Amer: 5 mL/min — ABNORMAL LOW (ref >60)
Glucose, Bld: 137 mg/dL — ABNORMAL HIGH (ref 70–99)
Phosphorus: 6.9 mg/dL — ABNORMAL HIGH (ref 2.5–4.6)
Potassium: 3.7 mmol/L (ref 3.5–5.1)
Sodium: 137 mmol/L (ref 135–145)

## 2019-07-13 LAB — MAGNESIUM: Magnesium: 1.9 mg/dL (ref 1.7–2.4)

## 2019-07-13 LAB — PROTIME-INR
INR: 4.6 (ref 0.8–1.2)
Prothrombin Time: 43.5 seconds — ABNORMAL HIGH (ref 11.4–15.2)

## 2019-07-13 MED ORDER — ENSURE ENLIVE PO LIQD: 237.0000 mL | Freq: Two times a day (BID) | ORAL | Status: DC

## 2019-07-13 NOTE — Progress Notes (Signed)
PROGRESS NOTE    Patricia Randall  [id number] DOB: 1928-10-05 DOA: 07/04/2019 PCP: Leonard Downing, MD   Brief Narrative:  86 year old female patient with PMH of atrial fibrillation on Warfarin, chronic systolic CHF, LVEF [id number] by TTE 09/27/2017, hyperlipidemia, hypertension, type II DM, stage IV CKD (baseline creatinine ~1.8), presented to the ER due to increasing fatigue, weakness, poor appetite, decreased oral intake, decreased mobility and laying in the bed all the time for the last 3 days.  Transiently in A. fib with RVR in the ED, improved after IV fluids.  Admitted for acute on chronic kidney disease due to COVID-19 infection with associated poor oral intake and dehydration.  Despite IV hydration, acute kidney injury continues to worsen, hyperkalemia, metabolic acidosis, suspect some uremia and volume overloaded. Patient/family open for short-term HD as per Nephrologist discussion with them on the weekend but patient not a long-term HD candidate.  HD initiated 07/10/2019.  Intermittently having a difficult time tolerating hemodialysis and also poor p.o. intake.  Palliative care team consulted to help establish goals of care..   Assessment & Plan:   Principal Problem:   COVID-19 virus infection Active Problems:   Permanent atrial fibrillation (HCC)   Cardiomyopathy ([place])   Essential hypertension, benign   Type II diabetes mellitus ([place])   DM (diabetes mellitus), type 2 with renal complications ([place])   AKI (acute kidney injury) ([place])   ESRD (end stage renal disease) ([place])  ESRD on hemodialysis, new diagnosis History of CKD stage V, progressively worsening Metabolic derangement -HD catheter placed by IR 07/09/2018.  For session dialysis 07/09/2018.  Difficult time tolerating dialysis.  Blood pressure medications to be held prior to HD. -Appears patient is not a good candidate for long-term dialysis -Sodium bicarb twice daily  COVID-19 infection -Completed remdesivir 1/18.   Currently on room air.  Continue supportive care  Paroxysmal atrial fibrillation Intermittent pause, resolved -Digoxin discontinued.  Toprol-XL reduced to 50 mg daily -Coumadin, pharmacy to dose  Chronic congestive heart failure with reduced ejection fraction, 30% -Currently on dialysis.-Difficult time tolerating dialysis.  No acute signs of decompensation at this time.  Essential hypertension -Norvasc 10 mg daily; Toprol-XL 50 mg daily.  Okay to hold prior to dialysis  Hyperlipidemia -Continue statin  Diabetes mellitus type 2 with renal complications -Currently uncontrolled due to being on steroids.  Continue insulin sliding scale and Accu-Chek.  Adjust as necessary  Goals of care discussion -Explained patient's son regarding her overall poor prognosis in the setting of inability to tolerate dialysis, poor nutrition and ongoing multiple comorbidities.  I explained to him that at this point we should focus more on comfort feeding versus any sort of artificial feeding.  He is aware of this.  He is open to discuss goals of care with palliative care.  DVT prophylaxis: Coumadin Code Status: Full code Family Communication: Spoke with [name], patient's son Disposition Plan: Currently having difficult time tolerating dialysis and poor oral intake.  Palliative care service consulted  Consultants:   Nephrology  Palliative care  Procedures:   Dialysis catheter placement 1/19  Antimicrobials:   None   Subjective: Overall denies any complaints.  Still very poor appetite, does not want to eat and drink much.  Denies any dysphagia or other complaints  Review of Systems Otherwise negative except as per HPI, including: General = no fevers, chills, dizziness,  fatigue HEENT/EYES = negative for loss of vision, double vision, blurred vision,  sore throa Cardiovascular= negative for chest pain, palpitation Respiratory/lungs= negative for shortness  of breath, cough, wheezing; hemoptysis,   Gastrointestinal= negative for nausea, vomiting, abdominal pain Genitourinary= negative for Dysuria MSK = Negative for arthralgia, myalgias Neurology= Negative for headache, numbness, tingling  Psychiatry= Negative for suicidal and homocidal ideation Skin= Negative for Rash   Objective: Vitals:   07/12/19 1925 07/13/19 0000 07/13/19 0400 07/13/19 0645  BP: 113/75 130/68 122/87   Pulse:  (!) 53 (!) 54   Resp: 18 11 16    Temp: 98 F (36.7 C) 97.9 F (36.6 C) 98 F (36.7 C)   TempSrc: Oral Oral    SpO2: 96% 95% 97%   Weight:    80.6 kg  Height:        Intake/Output Summary (Last 24 hours) at 07/13/2019 1251 Last data filed at 07/13/2019 0534 Gross per 24 hour  Intake 393 ml  Output --  Net 393 ml   Filed Weights   07/11/19 0100 07/11/19 0414 07/13/19 0645  Weight: 69.3 kg 69.3 kg 80.6 kg    Examination:  Constitutional: Elderly cachectic frail with bilateral temporal wasting. Respiratory: Clear to auscultation bilaterally Cardiovascular: Normal sinus rhythm, no rubs Abdomen: Nontender nondistended good bowel sounds Musculoskeletal: No edema noted Skin: No rashes seen Neurologic: CN 2-12 grossly intact.  And nonfocal Psychiatric: Alert awake oriented X2.  Poor judgment and insight.  Dialysis catheter noted in her right neck area without any evidence of active bleeding.   Data Reviewed:   CBC: Recent Labs  Lab 07/08/19 0532 07/09/19 0328 07/10/19 0645 07/12/19 0352 07/13/19 0359  WBC 4.5 5.5 5.0 5.8 5.0  HGB 15.5* 16.2* 15.8* 14.8 14.1  HCT 47.5* 49.2* 47.5* 44.0 41.6  MCV 86.1 85.6 83.6 83.3 82.7  PLT 258 289 291 304 [id number]   Basic Metabolic Panel: Recent Labs  Lab 07/09/19 0328 07/09/19 0328 07/09/19 1608 07/10/19 0645 07/11/19 0512 07/12/19 0352 07/13/19 0359  NA 141   < > 140 143 138 141 137  K 6.1*   < > 4.6 4.7 3.5 4.3 3.7  CL 109   < > 107 109 101 100 98  CO2 14*   < > 16* 17* 20* 19* 23  GLUCOSE 206*   < > 156* 130* 108* 162* 137*  BUN  114*   < > 121* 130* 60* 84* 53*  CREATININE 6.22*   < > 6.81* 7.73* 5.04* 7.26* 6.33*  CALCIUM 7.5*   < > 7.4* 7.4* 7.3* 7.1* 7.0*  MG  --   --   --   --   --  2.1 1.9  PHOS 8.4*  --   --  7.9* 4.8* 9.2* 6.9*   < > = values in this interval not displayed.   GFR: Estimated Creatinine Clearance: 6 mL/min (A) (by C-G formula based on SCr of 6.33 mg/dL (H)). Liver Function Tests: Recent Labs  Lab 07/07/19 0504 07/07/19 0504 07/09/19 0328 07/10/19 0645 07/11/19 0512 07/12/19 0352 07/13/19 0359  AST 26  --  29  --   --   --   --   ALT 14  --  13  --   --   --   --   ALKPHOS 57  --  62  --   --   --   --   BILITOT 0.9  --  1.2  --   --   --   --   PROT 5.0*  --  5.5*  --   --   --   --   ALBUMIN 1.7*   < >  1.7* 1.6* 1.6* 1.6* 1.5*   < > = values in this interval not displayed.   No results for input(s): LIPASE, AMYLASE in the last 168 hours. No results for input(s): AMMONIA in the last 168 hours. Coagulation Profile: Recent Labs  Lab 07/09/19 0328 07/10/19 0645 07/11/19 0512 07/12/19 0352 07/13/19 0359  INR 4.5* 4.4* 5.3* 5.0* 4.6*   Cardiac Enzymes: No results for input(s): CKTOTAL, CKMB, CKMBINDEX, TROPONINI in the last 168 hours. BNP (last 3 results) No results for input(s): PROBNP in the last 8760 hours. HbA1C: No results for input(s): HGBA1C in the last 72 hours. CBG: Recent Labs  Lab 07/12/19 0759 07/12/19 1634 07/13/19 0012 07/13/19 0743 07/13/19 1131  GLUCAP 155* 129* 118* 106* 186*   Lipid Profile: No results for input(s): CHOL, HDL, LDLCALC, TRIG, CHOLHDL, LDLDIRECT in the last 72 hours. Thyroid Function Tests: No results for input(s): TSH, T4TOTAL, FREET4, T3FREE, THYROIDAB in the last 72 hours. Anemia Panel: No results for input(s): VITAMINB12, FOLATE, FERRITIN, TIBC, IRON, RETICCTPCT in the last 72 hours. Sepsis Labs: No results for input(s): PROCALCITON, LATICACIDVEN in the last 168 hours.  Recent Results (from the past 240 hour(s))  Urine  culture     Status: Abnormal   Collection Time: 07/06/19  8:23 PM   Specimen: Urine, Clean Catch  Result Value Ref Range Status   Specimen Description URINE, CLEAN CATCH  Final   Special Requests   Final    NONE Performed at [facility], [address] [place], [place] [address]    Culture MULTIPLE SPECIES PRESENT, SUGGEST RECOLLECTION (A)  Final   Report Status 07/08/2019 FINAL  Final         Radiology Studies: No results found.      Scheduled Meds: . amLODipine  10 mg Oral Daily  . Chlorhexidine Gluconate Cloth  6 each Topical Q0600  . feeding supplement (NEPRO CARB STEADY)  237 mL Oral TID BM  . insulin aspart  0-9 Units Subcutaneous TID WC  . latanoprost  1 drop Both Eyes QHS  . metoprolol succinate  50 mg Oral Daily  . pravastatin  40 mg Oral q1800  . sodium bicarbonate  650 mg Oral BID  . sodium chloride flush  10-40 mL Intracatheter Q12H  . Warfarin - Pharmacist Dosing Inpatient   Does not apply q1800   Continuous Infusions: . sodium chloride    . sodium chloride       LOS: 8 days   Time spent= 35 mins    Adelyne Marchese Arsenio Loader, MD [facility]  If 7PM-7AM, please contact night-coverage  07/13/2019, 12:51 PM

## 2019-07-13 NOTE — Plan of Care (Signed)
Problem: Education: Goal: Knowledge of General Education information will improve Description: Including pain rating scale, medication(s)/side effects and non-pharmacologic comfort measures Outcome: Progressing   Problem: Health Behavior/Discharge Planning: Goal: Ability to manage health-related needs will improve Outcome: Progressing   Problem: Clinical Measurements: Goal: Ability to maintain clinical measurements within normal limits will improve Outcome: Progressing Goal: Will remain free from infection Outcome: Progressing Goal: Diagnostic test results will improve Outcome: Progressing Goal: Respiratory complications will improve Outcome: Progressing Goal: Cardiovascular complication will be avoided Outcome: Progressing   Problem: Activity: Goal: Risk for activity intolerance will decrease Outcome: Progressing   Problem: Nutrition: Goal: Adequate nutrition will be maintained Outcome: Progressing   Problem: Coping: Goal: Level of anxiety will decrease Outcome: Progressing   Problem: Elimination: Goal: Will not experience complications related to bowel motility Outcome: Progressing Goal: Will not experience complications related to urinary retention Outcome: Progressing   Problem: Pain Managment: Goal: General experience of comfort will improve Outcome: Progressing   Problem: Safety: Goal: Ability to remain free from injury will improve Outcome: Progressing   Problem: Skin Integrity: Goal: Risk for impaired skin integrity will decrease Outcome: Progressing   Problem: Education: Goal: Knowledge of risk factors and measures for prevention of condition will improve Outcome: Progressing   Problem: Coping: Goal: Psychosocial and spiritual needs will be supported Outcome: Progressing   Problem: Respiratory: Goal: Will maintain a patent airway Outcome: Progressing Goal: Complications related to the disease process, condition or treatment will be avoided or  minimized Outcome: Progressing   Problem: Education: Goal: Ability to demonstrate management of disease process will improve Outcome: Progressing Goal: Ability to verbalize understanding of medication therapies will improve Outcome: Progressing Goal: Individualized Educational Video(s) Outcome: Progressing   Problem: Activity: Goal: Capacity to carry out activities will improve Outcome: Progressing   Problem: Cardiac: Goal: Ability to achieve and maintain adequate cardiopulmonary perfusion will improve Outcome: Progressing   Problem: Education: Goal: Knowledge of General Education information will improve Description: Including pain rating scale, medication(s)/side effects and non-pharmacologic comfort measures 07/13/2019 2315 by Brooke Pace, RN Outcome: Progressing 07/13/2019 2314 by Brooke Pace, RN Outcome: Progressing   Problem: Health Behavior/Discharge Planning: Goal: Ability to manage health-related needs will improve 07/13/2019 2315 by Brooke Pace, RN Outcome: Progressing 07/13/2019 2314 by Brooke Pace, RN Outcome: Progressing   Problem: Clinical Measurements: Goal: Ability to maintain clinical measurements within normal limits will improve 07/13/2019 2315 by Brooke Pace, RN Outcome: Progressing 07/13/2019 2314 by Brooke Pace, RN Outcome: Progressing Goal: Will remain free from infection 07/13/2019 2315 by Brooke Pace, RN Outcome: Progressing 07/13/2019 2314 by Brooke Pace, RN Outcome: Progressing Goal: Diagnostic test results will improve 07/13/2019 2315 by Brooke Pace, RN Outcome: Progressing 07/13/2019 2314 by Brooke Pace, RN Outcome: Progressing Goal: Respiratory complications will improve 07/13/2019 2315 by Brooke Pace, RN Outcome: Progressing 07/13/2019 2314 by Brooke Pace, RN Outcome: Progressing Goal: Cardiovascular complication will be avoided 07/13/2019 2315 by  Brooke Pace, RN Outcome: Progressing 07/13/2019 2314 by Brooke Pace, RN Outcome: Progressing   Problem: Activity: Goal: Risk for activity intolerance will decrease 07/13/2019 2315 by Brooke Pace, RN Outcome: Progressing 07/13/2019 2314 by Brooke Pace, RN Outcome: Progressing   Problem: Nutrition: Goal: Adequate nutrition will be maintained 07/13/2019 2315 by Brooke Pace, RN Outcome: Progressing 07/13/2019 2314 by Brooke Pace, RN Outcome: Progressing   Problem: Coping: Goal: Level of anxiety will decrease 07/13/2019 2315 by Brooke Pace, RN  Outcome: Progressing 07/13/2019 2314 by Jarrett Soho T, RN Outcome: Progressing   Problem: Elimination: Goal: Will not experience complications related to bowel motility 07/13/2019 2315 by Brooke Pace, RN Outcome: Progressing 07/13/2019 2314 by Brooke Pace, RN Outcome: Progressing Goal: Will not experience complications related to urinary retention 07/13/2019 2315 by Brooke Pace, RN Outcome: Progressing 07/13/2019 2314 by Brooke Pace, RN Outcome: Progressing   Problem: Pain Managment: Goal: General experience of comfort will improve 07/13/2019 2315 by Brooke Pace, RN Outcome: Progressing 07/13/2019 2314 by Brooke Pace, RN Outcome: Progressing   Problem: Safety: Goal: Ability to remain free from injury will improve 07/13/2019 2315 by Brooke Pace, RN Outcome: Progressing 07/13/2019 2314 by Brooke Pace, RN Outcome: Progressing   Problem: Skin Integrity: Goal: Risk for impaired skin integrity will decrease 07/13/2019 2315 by Brooke Pace, RN Outcome: Progressing 07/13/2019 2314 by Brooke Pace, RN Outcome: Progressing   Problem: Education: Goal: Knowledge of risk factors and measures for prevention of condition will improve 07/13/2019 2315 by Brooke Pace, RN Outcome: Progressing 07/13/2019 2314 by  Brooke Pace, RN Outcome: Progressing   Problem: Coping: Goal: Psychosocial and spiritual needs will be supported 07/13/2019 2315 by Brooke Pace, RN Outcome: Progressing 07/13/2019 2314 by Brooke Pace, RN Outcome: Progressing   Problem: Respiratory: Goal: Will maintain a patent airway 07/13/2019 2315 by Brooke Pace, RN Outcome: Progressing 07/13/2019 2314 by Brooke Pace, RN Outcome: Progressing Goal: Complications related to the disease process, condition or treatment will be avoided or minimized 07/13/2019 2315 by Brooke Pace, RN Outcome: Progressing 07/13/2019 2314 by Brooke Pace, RN Outcome: Progressing   Problem: Education: Goal: Ability to demonstrate management of disease process will improve 07/13/2019 2315 by Brooke Pace, RN Outcome: Progressing 07/13/2019 2314 by Brooke Pace, RN Outcome: Progressing Goal: Ability to verbalize understanding of medication therapies will improve 07/13/2019 2315 by Brooke Pace, RN Outcome: Progressing 07/13/2019 2314 by Brooke Pace, RN Outcome: Progressing Goal: Individualized Educational Video(s) 07/13/2019 2315 by Brooke Pace, RN Outcome: Progressing 07/13/2019 2314 by Brooke Pace, RN Outcome: Progressing   Problem: Activity: Goal: Capacity to carry out activities will improve 07/13/2019 2315 by Brooke Pace, RN Outcome: Progressing 07/13/2019 2314 by Brooke Pace, RN Outcome: Progressing   Problem: Cardiac: Goal: Ability to achieve and maintain adequate cardiopulmonary perfusion will improve 07/13/2019 2315 by Brooke Pace, RN Outcome: Progressing 07/13/2019 2314 by Brooke Pace, RN Outcome: Progressing

## 2019-07-13 NOTE — Progress Notes (Signed)
ANTICOAGULATION CONSULT NOTE - Follow up Pharmacy Consult for Warfarin Indication: atrial fibrillation  No Known Allergies  Patient Measurements: Height: 5\' 2"  (157.5 cm) Weight: 177 lb 11.1 oz (80.6 kg) IBW/kg (Calculated) : 50.1   Vital Signs: Temp: 98 F (36.7 C) (01/22 0400) Temp Source: Oral (01/22 0000) BP: 122/87 (01/22 0400) Pulse Rate: 54 (01/22 0400)  Labs: Recent Labs    07/11/19 0512 07/12/19 0352 07/13/19 0359  HGB  --  14.8 14.1  HCT  --  44.0 41.6  PLT  --  304 251  LABPROT 48.5* 46.8* 43.5*  INR 5.3* 5.0* 4.6*  CREATININE 5.04* 7.26* 6.33*    Estimated Creatinine Clearance: 6 mL/min (A) (by C-G formula based on SCr of 6.33 mg/dL (H)).   Assessment: 86 yo W on warfarin for afib (CHADS2VASc = 7). Followed by Dr. Claris Gower [facility], [phone], who's staff said patient was last seen 06/11/19 and instructed to take 6mg -3mg -3mg -3mg  with follow up on 06/25/19, which the patient missed. Per patient, she's taking warfarin 6mg -6mg -3mg -3mg -3mg -6mg . Unclear from both sources which days of the week are 3mg  vs 6mg . Confirmed she took warfarin on 07/03/19 before admission. D-dimer < 5.   INR 4.6, remains supratherapeutic but now trending down. Poor PO at least 3 days before admission, and remains poor.  Last warfarin dose 1/12 PTA.  PTA Warfarin: 3-6mg , unclear exact dose but likely 3mg  for 3-4 days/wk and 6mg  all other days.  Goal of Therapy:  INR 2-3 Monitor platelets by anticoagulation protocol: Yes   Plan:  Hold Warfarin again today. Daily PT/INR Monitor for bleeding.   Arty Baumgartner, [place] Phone: [phone] 07/13/2019 9:30 AM

## 2019-07-13 NOTE — Progress Notes (Signed)
Subjective:    HD#2 yesterday, does not toleate UF 2/2 symptomatic IDH  No other interval events  No reported UOP  Tried to call son [name], no answer, left message  Objective Vital signs in last 24 hours: Vitals:   07/12/19 1925 07/13/19 0000 07/13/19 0400 07/13/19 0645  BP: 113/75 130/68 122/87   Pulse:  (!) 53 (!) 54   Resp: '18 11 16   ' Temp: 98 F (36.7 C) 97.9 F (36.6 C) 98 F (36.7 C)   TempSrc: Oral Oral    SpO2: 96% 95% 97%   Weight:    80.6 kg  Height:       Weight change:   Intake/Output Summary (Last 24 hours) at 07/13/2019 1059 Last data filed at 07/13/2019 0534 Gross per 24 hour  Intake 393 ml  Output -279 ml  Net 672 ml    Assessment/Plan: 86 year old BF with baseline medical issues including CKD (crt 2 at baseline) who presents with COVID and A on CRF 1.Renal- A on CRF in the setting of COVID.  Subacute onset of Uremia, hyperkalemia requiring Lokelma. Initiated dialysis 1/19 with temporary right IJ catheter for uremia, to see if we can have her stabilize and recover some GFR.  No evidence of recover GFR given no UOP.  Clearance has gone well but she is not tolerant of any ultrafiltration at the current time.  Given her age I do not think that she is a long-term candidate for dialysis, will need to continue exploring with patient and son, [name].  Tentative for HD #3 1/23 2. Hypertension/volume  - BP stable  -  3.  Metabolic acidosis -improved 4. Anemia  - not an issue 5. COVID- remdesivir, per TRH 6. Hyperkalemia, improved stop Lokelma 7. Supertherapeutic INR, per TR[facility],   Rexene Agent    Labs: Basic Metabolic Panel: Recent Labs  Lab 07/11/19 0512 07/12/19 0352 07/13/19 0359  NA 138 141 137  K 3.5 4.3 3.7  CL 101 100 98  CO2 20* 19* 23  GLUCOSE 108* 162* 137*  BUN 60* 84* 53*  CREATININE 5.04* 7.26* 6.33*  CALCIUM 7.3* 7.1* 7.0*  PHOS 4.8* 9.2* 6.9*   Liver Function Tests: Recent Labs  Lab 07/07/19 0504 07/07/19 0504 07/09/19 0328  07/10/19 0645 07/11/19 0512 07/12/19 0352 07/13/19 0359  AST 26  --  29  --   --   --   --   ALT 14  --  13  --   --   --   --   ALKPHOS 57  --  62  --   --   --   --   BILITOT 0.9  --  1.2  --   --   --   --   PROT 5.0*  --  5.5*  --   --   --   --   ALBUMIN 1.7*   < > 1.7*   < > 1.6* 1.6* 1.5*   < > = values in this interval not displayed.   No results for input(s): LIPASE, AMYLASE in the last 168 hours. No results for input(s): AMMONIA in the last 168 hours. CBC: Recent Labs  Lab 07/08/19 0532 07/08/19 0532 07/09/19 0328 07/09/19 0328 07/10/19 0645 07/12/19 0352 07/13/19 0359  WBC 4.5   < > 5.5   < > 5.0 5.8 5.0  HGB 15.5*   < > 16.2*   < > 15.8* 14.8 14.1  HCT 47.5*   < > 49.2*   < >  47.5* 44.0 41.6  MCV 86.1  --  85.6  --  83.6 83.3 82.7  PLT 258   < > 289   < > 291 304 251   < > = values in this interval not displayed.   Cardiac Enzymes: No results for input(s): CKTOTAL, CKMB, CKMBINDEX, TROPONINI in the last 168 hours. CBG: Recent Labs  Lab 07/12/19 0628 07/12/19 0759 07/12/19 1634 07/13/19 0012 07/13/19 0743  GLUCAP 146* 155* 129* 118* 106*    Iron Studies:  No results for input(s): IRON, TIBC, TRANSFERRIN, FERRITIN in the last 72 hours. Studies/Results: No results found. Medications: Infusions: . sodium chloride    . sodium chloride      Scheduled Medications: . amLODipine  10 mg Oral Daily  . Chlorhexidine Gluconate Cloth  6 each Topical Q0600  . feeding supplement (NEPRO CARB STEADY)  237 mL Oral TID BM  . insulin aspart  0-9 Units Subcutaneous TID WC  . latanoprost  1 drop Both Eyes QHS  . metoprolol succinate  50 mg Oral Daily  . pravastatin  40 mg Oral q1800  . sodium bicarbonate  650 mg Oral BID  . sodium chloride flush  10-40 mL Intracatheter Q12H  . Warfarin - Pharmacist Dosing Inpatient   Does not apply q1800    have reviewed scheduled and prn medications.  Physical Exam: Gen-  NAD, more awake/alert/interactive HEENT-   PERRLA Lungs- CBS bilat CV-  RRR abd-  Soft, non tender Ext- min edema  R IJ Temp HD Cath (placed 1/19 IR)  07/13/2019,10:59 AM  LOS: 8 days

## 2019-07-14 LAB — CBC
HCT: 40.4 % (ref 36.0–46.0)
Hemoglobin: 14.1 g/dL (ref 12.0–15.0)
MCH: 28.8 pg (ref 26.0–34.0)
MCHC: 34.9 g/dL (ref 30.0–36.0)
MCV: 82.4 fL (ref 80.0–100.0)
Platelets: 230 10*3/uL (ref 150–400)
RBC: 4.9 MIL/uL (ref 3.87–5.11)
RDW: 15.5 % (ref 11.5–15.5)
WBC: 5.5 10*3/uL (ref 4.0–10.5)
nRBC: 0 % (ref 0.0–0.2)

## 2019-07-14 LAB — RENAL FUNCTION PANEL
Albumin: 1.6 g/dL — ABNORMAL LOW (ref 3.5–5.0)
Anion gap: 14 (ref 5–15)
BUN: 67 mg/dL — ABNORMAL HIGH (ref 8–23)
CO2: 24 mmol/L (ref 22–32)
Calcium: 7.1 mg/dL — ABNORMAL LOW (ref 8.9–10.3)
Chloride: 98 mmol/L (ref 98–111)
Creatinine, Ser: 7.42 mg/dL — ABNORMAL HIGH (ref 0.44–1.00)
GFR calc Af Amer: 5 mL/min — ABNORMAL LOW (ref >60)
GFR calc non Af Amer: 4 mL/min — ABNORMAL LOW (ref >60)
Glucose, Bld: 174 mg/dL — ABNORMAL HIGH (ref 70–99)
Phosphorus: 7 mg/dL — ABNORMAL HIGH (ref 2.5–4.6)
Potassium: 3.5 mmol/L (ref 3.5–5.1)
Sodium: 136 mmol/L (ref 135–145)

## 2019-07-14 LAB — MAGNESIUM: Magnesium: 2 mg/dL (ref 1.7–2.4)

## 2019-07-14 LAB — GLUCOSE, CAPILLARY
Glucose-Capillary: 152 mg/dL — ABNORMAL HIGH (ref 70–99)
Glucose-Capillary: 156 mg/dL — ABNORMAL HIGH (ref 70–99)
Glucose-Capillary: 126 mg/dL — ABNORMAL HIGH (ref 70–99)

## 2019-07-14 LAB — PROTIME-INR
INR: 4.5 (ref 0.8–1.2)
Prothrombin Time: 42.8 seconds — ABNORMAL HIGH (ref 11.4–15.2)

## 2019-07-14 MED ORDER — HEPARIN SODIUM (PORCINE) 1000 UNIT/ML IJ SOLN
INTRAMUSCULAR | Status: AC
  Filled 2019-07-14: qty 4

## 2019-07-14 MED ORDER — AMLODIPINE BESYLATE 5 MG PO TABS
5.0000 mg | ORAL_TABLET | Freq: Every day | ORAL | Status: DC
  Administered 2019-07-14 – 2019-07-17 (×4): 5 mg via ORAL
  Filled 2019-07-14 (×4): qty 1

## 2019-07-14 MED ORDER — METOPROLOL SUCCINATE ER 25 MG PO TB24
25.0000 mg | ORAL_TABLET | Freq: Every day | ORAL | Status: DC
  Administered 2019-07-14 – 2019-07-17 (×4): 25 mg via ORAL
  Filled 2019-07-14 (×4): qty 1

## 2019-07-14 NOTE — Progress Notes (Signed)
CRITICAL VALUE ALERT  Critical Value:  INR 4.5  Date & Time Notified:  07/14/19 0606  Provider Notified:  X. Blount, NP  Orders Received/Actions taken:  No new orders received

## 2019-07-14 NOTE — Progress Notes (Signed)
PROGRESS NOTE    Patricia Randall  [id number] DOB: 02-24-1929 DOA: 07/04/2019 PCP: Leonard Downing, MD   Brief Narrative:  86 year old female patient with PMH of atrial fibrillation on Warfarin, chronic systolic CHF, LVEF [id number] by TTE 09/27/2017, hyperlipidemia, hypertension, type II DM, stage IV CKD (baseline creatinine ~1.8), presented to the ER due to increasing fatigue, weakness, poor appetite, decreased oral intake, decreased mobility and laying in the bed all the time for the last 3 days.  Transiently in A. fib with RVR in the ED, improved after IV fluids.  Admitted for acute on chronic kidney disease due to COVID-19 infection with associated poor oral intake and dehydration.  Despite IV hydration, acute kidney injury continues to worsen, hyperkalemia, metabolic acidosis, suspect some uremia and volume overloaded. Patient/family open for short-term HD as per Nephrologist discussion with them on the weekend but patient not a long-term HD candidate.  HD initiated 07/10/2019.  Intermittently having a difficult time tolerating hemodialysis and also poor p.o. intake.  Palliative care team consulted to help establish goals of care..   Assessment & Plan:   Principal Problem:   COVID-19 virus infection Active Problems:   Permanent atrial fibrillation (HCC)   Cardiomyopathy ([place])   Essential hypertension, benign   Type II diabetes mellitus ([place])   DM (diabetes mellitus), type 2 with renal complications ([place])   AKI (acute kidney injury) ([place])   ESRD (end stage renal disease) ([place])  ESRD on hemodialysis, new diagnosis History of CKD stage V, progressively worsening Metabolic derangement -HD catheter placed by IR 07/09/2018.  For session dialysis 07/09/2018.  Having difficulty tolerating dialysis. -Not a good term HD candidate per nephro -Sodium bicarb twice daily  COVID-19 infection -Completed remdesivir 1/18.  Currently on room air.  Continue supportive care  Paroxysmal atrial  fibrillation Intermittent pause, resolved -Digoxin discontinued.  Toprol-XL 50 mg daily -Coumadin, pharmacy to dose  Chronic congestive heart failure with reduced ejection fraction, 30% -Currently on dialysis.-Difficult time tolerating dialysis.  No acute signs of decompensation at this time.  Essential hypertension -Norvasc 10 mg daily; Toprol-XL 50 mg daily.  Okay to hold prior to dialysis  Hyperlipidemia -Continue statin  Diabetes mellitus type 2 with renal complications -Currently uncontrolled due to being on steroids.  Continue insulin sliding scale and Accu-Chek.  Adjust as necessary  Goals of care discussion -Overall poor prognosis especially if she has difficult time tolerating dialysis.  Palliative care consulted  DVT prophylaxis: Coumadin Code Status: Full code Family Communication: Spoke with the son yesterday.  Awaiting palliative care team to speak with them to establish goals of care. Disposition Plan: Currently having difficulty time tolerating dialysis.  Awaiting palliative care input  Consultants:   Nephrology  Palliative care  Procedures:   Dialysis catheter placement 1/19  Antimicrobials:   None   Subjective: Overall does not have any complaints, continues to have very poor appetite.  Seems to be tolerating today's dialysis session for now.  Review of Systems Otherwise negative except as per HPI, including: General = no fevers, chills, dizziness,  fatigue HEENT/EYES = negative for loss of vision, double vision, blurred vision,  sore throa Cardiovascular= negative for chest pain, palpitation Respiratory/lungs= negative for shortness of breath, cough, wheezing; hemoptysis,  Gastrointestinal= negative for nausea, vomiting, abdominal pain Genitourinary= negative for Dysuria MSK = Negative for arthralgia, myalgias Neurology= Negative for headache, numbness, tingling  Psychiatry= Negative for suicidal and homocidal ideation Skin= Negative for  Rash    Objective: Vitals:   07/13/19 1656 07/13/19 1953  07/13/19 2000 07/14/19 0601  BP: 126/87 (!) 129/104 130/89 (!) 129/96  Pulse: 74 86  73  Resp: 18 18 18 16   Temp: 97.6 F (36.4 C) (!) 97.5 F (36.4 C)  97.7 F (36.5 C)  TempSrc: Oral Axillary  Oral  SpO2: (!) 80% 93%  94%  Weight:      Height:        Intake/Output Summary (Last 24 hours) at 07/14/2019 1153 Last data filed at 07/13/2019 2101 Gross per 24 hour  Intake 130 ml  Output --  Net 130 ml   Filed Weights   07/11/19 0100 07/11/19 0414 07/13/19 0645  Weight: 69.3 kg 69.3 kg 80.6 kg    Examination:  Constitutional: Cachectic elderly frail but bilateral temporal wasting Respiratory: Clear to auscultation bilaterally Cardiovascular: Normal sinus rhythm, no rubs Abdomen: Nontender nondistended good bowel sounds Musculoskeletal: No edema noted Skin: No rashes seen Neurologic: CN 2-12 grossly intact.  And nonfocal Psychiatric: Overall poor judgment and insight.  Awake alert oriented X2-around her baseline   Dialysis catheter noted in her right neck area without any evidence of active bleeding.   Data Reviewed:   CBC: Recent Labs  Lab 07/09/19 0328 07/10/19 0645 07/12/19 0352 07/13/19 0359 07/14/19 0310  WBC 5.5 5.0 5.8 5.0 5.5  HGB 16.2* 15.8* 14.8 14.1 14.1  HCT 49.2* 47.5* 44.0 41.6 40.4  MCV 85.6 83.6 83.3 82.7 82.4  PLT 289 291 304 251 [id number]   Basic Metabolic Panel: Recent Labs  Lab 07/10/19 0645 07/11/19 0512 07/12/19 0352 07/13/19 0359 07/14/19 0310  NA 143 138 141 137 136  K 4.7 3.5 4.3 3.7 3.5  CL 109 101 100 98 98  CO2 17* 20* 19* 23 24  GLUCOSE 130* 108* 162* 137* 174*  BUN 130* 60* 84* 53* 67*  CREATININE 7.73* 5.04* 7.26* 6.33* 7.42*  CALCIUM 7.4* 7.3* 7.1* 7.0* 7.1*  MG  --   --  2.1 1.9 2.0  PHOS 7.9* 4.8* 9.2* 6.9* 7.0*   GFR: Estimated Creatinine Clearance: 5.2 mL/min (A) (by C-G formula based on SCr of 7.42 mg/dL (H)). Liver Function Tests: Recent Labs  Lab  07/09/19 0328 07/09/19 0328 07/10/19 0645 07/11/19 0512 07/12/19 0352 07/13/19 0359 07/14/19 0310  AST 29  --   --   --   --   --   --   ALT 13  --   --   --   --   --   --   ALKPHOS 62  --   --   --   --   --   --   BILITOT 1.2  --   --   --   --   --   --   PROT 5.5*  --   --   --   --   --   --   ALBUMIN 1.7*   < > 1.6* 1.6* 1.6* 1.5* 1.6*   < > = values in this interval not displayed.   No results for input(s): LIPASE, AMYLASE in the last 168 hours. No results for input(s): AMMONIA in the last 168 hours. Coagulation Profile: Recent Labs  Lab 07/10/19 0645 07/11/19 0512 07/12/19 0352 07/13/19 0359 07/14/19 0310  INR 4.4* 5.3* 5.0* 4.6* 4.5*   Cardiac Enzymes: No results for input(s): CKTOTAL, CKMB, CKMBINDEX, TROPONINI in the last 168 hours. BNP (last 3 results) No results for input(s): PROBNP in the last 8760 hours. HbA1C: No results for input(s): HGBA1C in the last 72 hours. CBG: Recent  Labs  Lab 07/13/19 0743 07/13/19 1131 07/13/19 1658 07/13/19 2124 07/14/19 0759  GLUCAP 106* 186* 149* 231* 152*   Lipid Profile: No results for input(s): CHOL, HDL, LDLCALC, TRIG, CHOLHDL, LDLDIRECT in the last 72 hours. Thyroid Function Tests: No results for input(s): TSH, T4TOTAL, FREET4, T3FREE, THYROIDAB in the last 72 hours. Anemia Panel: No results for input(s): VITAMINB12, FOLATE, FERRITIN, TIBC, IRON, RETICCTPCT in the last 72 hours. Sepsis Labs: No results for input(s): PROCALCITON, LATICACIDVEN in the last 168 hours.  Recent Results (from the past 240 hour(s))  Urine culture     Status: Abnormal   Collection Time: 07/06/19  8:23 PM   Specimen: Urine, Clean Catch  Result Value Ref Range Status   Specimen Description URINE, CLEAN CATCH  Final   Special Requests   Final    NONE Performed at [facility], [address], [place], [place] [address]    Culture MULTIPLE SPECIES PRESENT, SUGGEST RECOLLECTION (A)  Final   Report Status 07/08/2019 FINAL   Final         Radiology Studies: No results found.      Scheduled Meds: . amLODipine  5 mg Oral Daily  . Chlorhexidine Gluconate Cloth  6 each Topical Q0600  . feeding supplement (ENSURE ENLIVE)  237 mL Oral BID BM  . feeding supplement (NEPRO CARB STEADY)  237 mL Oral TID BM  . heparin      . insulin aspart  0-9 Units Subcutaneous TID WC  . latanoprost  1 drop Both Eyes QHS  . metoprolol succinate  25 mg Oral Daily  . pravastatin  40 mg Oral q1800  . sodium chloride flush  10-40 mL Intracatheter Q12H  . Warfarin - Pharmacist Dosing Inpatient   Does not apply q1800   Continuous Infusions: . sodium chloride    . sodium chloride       LOS: 9 days   Time spent= 25 mins    Ovadia Lopp Arsenio Loader, MD [facility]  If 7PM-7AM, please contact night-coverage  07/14/2019, 11:53 AM

## 2019-07-14 NOTE — Progress Notes (Signed)
ANTICOAGULATION CONSULT NOTE - Follow up Pharmacy Consult for Warfarin Indication: atrial fibrillation  No Known Allergies  Patient Measurements: Height: 5\' 2"  (157.5 cm) Weight: 177 lb 11.1 oz (80.6 kg) IBW/kg (Calculated) : 50.1   Vital Signs: Temp: 97.7 F (36.5 C) (01/23 0601) Temp Source: Oral (01/23 0601) BP: 129/96 (01/23 0601) Pulse Rate: 73 (01/23 0601)  Labs: Recent Labs    07/12/19 0352 07/12/19 0352 07/13/19 0359 07/14/19 0310  HGB 14.8   < > 14.1 14.1  HCT 44.0  --  41.6 40.4  PLT 304  --  251 230  LABPROT 46.8*  --  43.5* 42.8*  INR 5.0*  --  4.6* 4.5*  CREATININE 7.26*  --  6.33* 7.42*   < > = values in this interval not displayed.    Estimated Creatinine Clearance: 5.2 mL/min (A) (by C-G formula based on SCr of 7.42 mg/dL (H)).   Assessment: 86 yo female on warfarin for afib (CHADS2VASc = 7). Followed by Dr. Claris Gower [facility], [phone], who's staff said patient was last seen 06/11/19 and instructed to take 6mg -3mg -3mg -3mg  with follow up on 06/25/19, which the patient missed. Per patient, she's taking warfarin 6mg -6mg -3mg -3mg -3mg -6mg . Unclear from both sources which days of the week are 3mg  vs 6mg . Confirmed she took warfarin on 07/03/19 before admission. D-dimer < 5.   INR 4.5, remains supratherapeutic but is trending down slowly. Poor PO at least 3 days before admission and continues to have poor intake. CBC stable. No reported bleeding. Last warfarin dose received was 0.5mg  on 1/16.   PTA Warfarin: 3-6mg , unclear exact dose but likely 3mg  for 3-4 days/wk and 6mg  all other days.  Goal of Therapy:  INR 2-3 Monitor platelets by anticoagulation protocol: Yes   Plan:  Hold warfarin tonight  Monitor INR, CBC and S/S of bleeding daily    Cristela Felt, PharmD PGY1 Pharmacy Resident [employer]: [phone]  07/14/2019 8:57 AM

## 2019-07-14 NOTE — Procedures (Signed)
I was present at this dialysis session. I have reviewed the session itself and made appropriate changes.   3K bath No UF. [facility] working well 3h.  Agree with GOC and Palliative involvement.  Is not toelrating HD well overall.    Filed Weights   07/11/19 0100 07/11/19 0414 07/13/19 0645  Weight: 69.3 kg 69.3 kg 80.6 kg    Recent Labs  Lab 07/14/19 0310  NA 136  K 3.5  CL 98  CO2 24  GLUCOSE 174*  BUN 67*  CREATININE 7.42*  CALCIUM 7.1*  PHOS 7.0*    Recent Labs  Lab 07/12/19 0352 07/13/19 0359 07/14/19 0310  WBC 5.8 5.0 5.5  HGB 14.8 14.1 14.1  HCT 44.0 41.6 40.4  MCV 83.3 82.7 82.4  PLT 304 251 230    Scheduled Meds: . amLODipine  5 mg Oral Daily  . Chlorhexidine Gluconate Cloth  6 each Topical Q0600  . feeding supplement (ENSURE ENLIVE)  237 mL Oral BID BM  . feeding supplement (NEPRO CARB STEADY)  237 mL Oral TID BM  . heparin      . insulin aspart  0-9 Units Subcutaneous TID WC  . latanoprost  1 drop Both Eyes QHS  . metoprolol succinate  25 mg Oral Daily  . pravastatin  40 mg Oral q1800  . sodium chloride flush  10-40 mL Intracatheter Q12H  . Warfarin - Pharmacist Dosing Inpatient   Does not apply q1800   Continuous Infusions: . sodium chloride    . sodium chloride     PRN Meds:.sodium chloride, sodium chloride, acetaminophen **OR** acetaminophen, alteplase, heparin, hydrALAZINE, Ipratropium-Albuterol, lidocaine, ondansetron **OR** ondansetron (ZOFRAN) IV, sodium chloride flush   Pearson Grippe  MD 07/14/2019, 10:34 AM

## 2019-07-15 DIAGNOSIS — Z515 Encounter for palliative care: Secondary | ICD-10-CM

## 2019-07-15 DIAGNOSIS — Z66 Do not resuscitate: Secondary | ICD-10-CM

## 2019-07-15 DIAGNOSIS — Z7189 Other specified counseling: Secondary | ICD-10-CM

## 2019-07-15 DIAGNOSIS — M6281 Muscle weakness (generalized): Secondary | ICD-10-CM

## 2019-07-15 DIAGNOSIS — R627 Adult failure to thrive: Secondary | ICD-10-CM

## 2019-07-15 LAB — CBC
HCT: 45.5 % (ref 36.0–46.0)
Hemoglobin: 15.3 g/dL — ABNORMAL HIGH (ref 12.0–15.0)
MCH: 27.7 pg (ref 26.0–34.0)
MCHC: 33.6 g/dL (ref 30.0–36.0)
MCV: 82.4 fL (ref 80.0–100.0)
Platelets: 273 10*3/uL (ref 150–400)
RBC: 5.52 MIL/uL — ABNORMAL HIGH (ref 3.87–5.11)
RDW: 15.4 % (ref 11.5–15.5)
WBC: 7.1 10*3/uL (ref 4.0–10.5)
nRBC: 0 % (ref 0.0–0.2)

## 2019-07-15 LAB — RENAL FUNCTION PANEL
Albumin: 1.7 g/dL — ABNORMAL LOW (ref 3.5–5.0)
Anion gap: 14 (ref 5–15)
BUN: 36 mg/dL — ABNORMAL HIGH (ref 8–23)
CO2: 24 mmol/L (ref 22–32)
Calcium: 7.5 mg/dL — ABNORMAL LOW (ref 8.9–10.3)
Chloride: 100 mmol/L (ref 98–111)
Creatinine, Ser: 5.23 mg/dL — ABNORMAL HIGH (ref 0.44–1.00)
GFR calc Af Amer: 8 mL/min — ABNORMAL LOW (ref >60)
GFR calc non Af Amer: 7 mL/min — ABNORMAL LOW (ref >60)
Glucose, Bld: 134 mg/dL — ABNORMAL HIGH (ref 70–99)
Phosphorus: 4.5 mg/dL (ref 2.5–4.6)
Potassium: 4.7 mmol/L (ref 3.5–5.1)
Sodium: 138 mmol/L (ref 135–145)

## 2019-07-15 LAB — MAGNESIUM: Magnesium: 1.8 mg/dL (ref 1.7–2.4)

## 2019-07-15 LAB — GLUCOSE, CAPILLARY
Glucose-Capillary: 120 mg/dL — ABNORMAL HIGH (ref 70–99)
Glucose-Capillary: 178 mg/dL — ABNORMAL HIGH (ref 70–99)
Glucose-Capillary: 142 mg/dL — ABNORMAL HIGH (ref 70–99)
Glucose-Capillary: 179 mg/dL — ABNORMAL HIGH (ref 70–99)

## 2019-07-15 LAB — PROTIME-INR
INR: 3.3 — ABNORMAL HIGH (ref 0.8–1.2)
Prothrombin Time: 33.7 seconds — ABNORMAL HIGH (ref 11.4–15.2)

## 2019-07-15 NOTE — Progress Notes (Signed)
Subjective:    HD#3 yesterday, no UF attempted; tolerated ok  Met with pt during palliative care visit, updated son/patient that she has improved with dialysis, and cleaning has been successful but we have not been able to do any ultrafiltration.  Explored that she still is not a reasonable candidate for longstanding outpatient dialysis  To date, no evidence of GFR recovery  No other interval events  No reported UOP  Objective Vital signs in last 24 hours: Vitals:   07/15/19 0000 07/15/19 0513 07/15/19 0547 07/15/19 1004  BP: 134/89 (!) 140/96  122/85  Pulse: 76 80  84  Resp: 11 18    Temp:   97.6 F (36.4 C)   TempSrc:   Axillary   SpO2: 96% 100%    Weight:  71.4 kg    Height:       Weight change:   Intake/Output Summary (Last 24 hours) at 07/15/2019 1031 Last data filed at 07/15/2019 0535 Gross per 24 hour  Intake 120 ml  Output --  Net 120 ml    Assessment/Plan: 86 year old BF with baseline medical issues including CKD (crt 2 at baseline) who presents with COVID and A on CRF 1.Renal- A on CRF in the setting of COVID.  Subacute onset of Uremia, hyperkalemia requiring Lokelma. Initiated dialysis 1/19 with temporary right IJ catheter for uremia, to see if we can have her stabilize and likely wait for recovery of some GFR; to date, no evidence of recover GFR given no UOP.  Clearance has gone well but she is not tolerant of any ultrafiltration at the current time.  Palliative following, I do not think she is a candidate for outpatient dialysis.  We will continue into this week and see if we notice any changes, continue daily dialogue.  Son Patricia Randall is understanding, he is a transplant patient followed in our office.  Tentatively on THS schedule 2. Hypertension/volume  - BP stable, as above 3.  Metabolic acidosis -resolved 4. Anemia  - not an issue 5. COVID- s/p remdesivir, per TRH 6. Hyperkalemia, resolved 7. Supertherapeutic INR, per TRH,   Rexene Agent    Labs: Basic  Metabolic Panel: Recent Labs  Lab 07/13/19 0359 07/14/19 0310 07/15/19 0248  NA 137 136 138  K 3.7 3.5 4.7  CL 98 98 100  CO2 _0 GLUCOSE 137* 174* 134*  BUN 53* 67* 36*  CREATININE 6.33* 7.42* 5.23*  CALCIUM 7.0* 7.1* 7.5*  PHOS 6.9* 7.0* 4.5   Liver Function Tests: Recent Labs  Lab 07/09/19 0328 07/10/19 0645 07/13/19 0359 07/14/19 0310 07/15/19 0248  AST 29  --   --   --   --   ALT 13  --   --   --   --   ALKPHOS 62  --   --   --   --   BILITOT 1.2  --   --   --   --   PROT 5.5*  --   --   --   --   ALBUMIN 1.7*   < > 1.5* 1.6* 1.7*   < > = values in this interval not displayed.   No results for input(s): LIPASE, AMYLASE in the last 168 hours. No results for input(s): AMMONIA in the last 168 hours. CBC: Recent Labs  Lab 07/10/19 0645 07/10/19 0645 07/12/19 0352 07/12/19 0352 07/13/19 0359 07/14/19 0310 07/15/19 0248  WBC 5.0   < > 5.8   < > 5.0 5.5 7.1  HGB 15.8*   < > 14.8   < > 14.1 14.1 15.3*  HCT 47.5*   < > 44.0   < > 41.6 40.4 45.5  MCV 83.6  --  83.3  --  82.7 82.4 82.4  PLT 291   < > 304   < > 251 230 273   < > = values in this interval not displayed.   Cardiac Enzymes: No results for input(s): CKTOTAL, CKMB, CKMBINDEX, TROPONINI in the last 168 hours. CBG: Recent Labs  Lab 07/13/19 2124 07/14/19 0759 07/14/19 1656 07/14/19 2207 07/15/19 0747  GLUCAP 231* 152* 156* 126* 120*    Iron Studies:  No results for input(s): IRON, TIBC, TRANSFERRIN, FERRITIN in the last 72 hours. Studies/Results: No results found. Medications: Infusions: . sodium chloride    . sodium chloride      Scheduled Medications: . amLODipine  5 mg Oral Daily  . Chlorhexidine Gluconate Cloth  6 each Topical Q0600  . feeding supplement (ENSURE ENLIVE)  237 mL Oral BID BM  . feeding supplement (NEPRO CARB STEADY)  237 mL Oral TID BM  . insulin aspart  0-9 Units Subcutaneous TID WC  . latanoprost  1 drop Both Eyes QHS  . metoprolol succinate  25 mg Oral  Daily  . pravastatin  40 mg Oral q1800  . sodium chloride flush  10-40 mL Intracatheter Q12H  . Warfarin - Pharmacist Dosing Inpatient   Does not apply q1800    have reviewed scheduled and prn medications.  Physical Exam: Gen-  NAD, more awake/alert/interactive HEENT-  PERRLA Lungs- CBS bilat CV-  RRR abd-  Soft, non tender Ext- min edema  R IJ Temp HD Cath (placed 1/19 IR)  07/15/2019,10:31 AM  LOS: 10 days

## 2019-07-15 NOTE — Consult Note (Addendum)
Consultation Note Date: 07/15/2019   Patient Name: Patricia Randall  DOB: [birth year]  MRN: [id number]  Age / Sex: 86 y.o., female  PCP: Patricia Downing, MD Referring Physician: Damita Lack, MD  Reason for Consultation: Establishing goals of care, not a longer term HD candidate  HPI/Patient Profile:  Per hospitalist note --> 86 year old female patient with PMH of atrial fibrillation on Warfarin, chronic systolic CHF, LVEF 65-99% by TTE 09/27/2017, hyperlipidemia, hypertension, type II DM, stage IV CKD (baseline creatinine ~1.8), presented to the ER due to increasing fatigue, weakness, poor appetite, decreased oral intake, decreased mobility and laying in the bed all the time for the last 3 days. Transiently in A. fib with RVR in the ED, improved after IV fluids. Admitted for acute on chronic kidney disease due to COVID-19 infection with associated poor oral intake and dehydration. Despite IV hydration, acute kidney injury continues to worsen, hyperkalemia, metabolic acidosis, suspect some uremia and volume overloaded. Patient/family open for short-term HD as per Nephrologist discussion with them on the weekend but patient not a long-term HD candidate.  HD initiated 07/10/2019.  Intermittently having a difficult time tolerating hemodialysis and also poor p.o. intake.  Palliative care team consulted to help establish goals of care  Clinical Assessment and Goals of Care: I have reviewed medical records including EPIC notes, labs and imaging, and assessed the patient.    I met with Patricia Randall to further discuss diagnosis prognosis, GOC, EOL wishes, disposition and options.   I introduced Palliative Medicine as specialized medical care for people living with serious illness. It focuses on providing relief from the symptoms and stress of a serious illness. The goal is to improve quality of life for both the patient  and the family.  I asked Patricia Randall what she understood about her hospitalization. She said that she got "the covid". She expressed frustration as she does not understand where she may have gotten it from. We talked about the various places that people can contract covid and how easy it is if a carrier coughs or sneezes near her. She said that she understood. I asked her how she was feeling today and she endorsed that she was feeling well though still had a Randall of appetite. We talked about some foods that she is able to tolerate such as peaches, pinto beans, and jello.   We then with Patricia Randall's son, Patricia Randall on the phone discussed her present situation in regards to her poor kidney function. I shared with her that she is not an outpatient hemodialysis candidate. She said that she really never wanted to be on hemodialysis but is "not ready to go". We talked about her sons hemodialysis and how difficult of a toll it takes on him. They both expressed understanding that she will not be able to get hemodialysis upon discharge. Her goals are to increase her strength so that she may be able to get back home. I told her that this is not unreasonable but I am concerned that she has very limited time  left as her kidneys are likely not going to function well upon discharge leading to more lethargy and inability to participate meaningfully in therapy.  Socially Patricia Randall lives with her son who helps with her basic needs. She said that she would feel anxious about anyone coming into their home as it is in [place]. She said that they have leaks everywhere. They have not had hot water for many months now and use a pot in the kitchen to heat water for bath care. I shared with her that it is important for her to better identify where she would like to be if we are entering her final chapter of life.  We talked about her code status in detail. Explained the trauma that a code could cause in a patient such as herself. Explained that  intubation would be a temporizing measure and it's something that would come with tremendous risk in a patient such as herself. She and he son decided that they would not like either intervention. They are of strong faith and do believe that the Patricia Randall will guide them. I have requested for a chaplain to come and speak with miss Patricia Randall.   Discussed overall trajectory if patients kidneys do not start to function on their own which again is felt to be unlikely. She said that she understands. Explained what hospice is and their principles. Patricia Randall and Patricia Randall are open to speaking with hospice to see how they may be able to better aid them moving forward.   Patricia Randall was clearly very overwhelmed by all of these conversations therefore we ended by talking about Patricia Randall which appeared to have made her smile.   Discussed with patient the importance of continued conversation with family and their  medical providers regarding overall plan of care and treatment options, ensuring decisions are within the context of the patients values and GOCs.  We will continue to follow to help guide patient decisions as able.  Decision Maker: Patient is able to make decisions on her own though relies on input from her son, Patricia Randall.   SUMMARY OF RECOMMENDATIONS   DNAR/DNI Patient realizes that she will not get HD when she discharges TOC --> Hospice consult Social Work --Patricia Randall w/o working water Goal is to gain strength and go home  Code Status/Advance Care Planning:  DNR  Symptom Management:  Goals of Care:                 - As above   Muscular Weakness:                 - PT                 - OT  Failure to Thrive:  - 1:1 Feeding  - Encourage satiable foods for patient (peaches, jello, pinto beans)  - Ensure BID   Xerostomia:                 - Good oral care QShift                 - Encourage liquid intake  Delirium:                 - Delirium precautions                 - Get up during the day                 -  Encourage a familiar face to remain present throughout  the day                 - Keep blinds open and lights on during daylight hours                 - Minimize the use of opioids/benzodiazepines                   Spiritual:                 - Chaplain consult Palliative Prophylaxis:   Aspiration, Bowel Regimen, Delirium Protocol, Eye Care, Frequent Pain Assessment, Oral Care and Turn Reposition  Additional Recommendations (Limitations, Scope, Preferences):  Full Scope Treatment  Psycho-social/Spiritual:   Desire for further Chaplaincy support: Yes  Additional Recommendations: Caregiving  Support/Resources and Education on Hospice  Prognosis:   < 4 weeks, if her kidneys do not improve on their own.  Discharge Planning: [facility] for rehab with Palliative care service follow-up     Primary Diagnoses: Present on Admission: . COVID-19 virus infection . DM (diabetes mellitus), type 2 with renal complications ([place]) . Permanent atrial fibrillation ([place]) . Essential hypertension, benign  I have reviewed the medical record, interviewed the patient and family, and examined the patient. The following aspects are pertinent. Past Medical History:  Diagnosis Date  . Atrial fibrillation ([place])    a. 03/2013 s/p TEE/DCCV;  b. 03/2013 Eliquis initiated.  . Chronic systolic CHF (congestive heart failure) ([place])    a. 03/2013 Echo: EF 20-25%, diff HK, mild to mod MR, sev dil LA, mild RV dysfxn, sev dil RA, mod TR.  . Claudication ([place])   . Glaucoma   . High cholesterol   . Hypertension   . Type II diabetes mellitus ([place])    Social History   Socioeconomic History  . Marital status: Widowed    Spouse name: Not on file  . Number of children: Not on file  . Years of education: Not on file  . Highest education level: Not on file  Occupational History  . Not on file  Tobacco Use  . Smoking status: Never Smoker  . Smokeless tobacco: Never Used  Substance and Sexual  Activity  . Alcohol use: No  . Drug use: No  . Sexual activity: Never  Other Topics Concern  . Not on file  Social History Narrative   Lives in [place] with her son.   Social Determinants of Health   Financial Resource Strain:   . Difficulty of Paying Living Expenses: Not on file  Food Insecurity:   . Worried About Charity fundraiser in the Last Year: Not on file  . Ran Out of Food in the Last Year: Not on file  Transportation Needs:   . Randall of Transportation (Medical): Not on file  . Randall of Transportation (Non-Medical): Not on file  Physical Activity:   . Days of Exercise per Week: Not on file  . Minutes of Exercise per Session: Not on file  Stress:   . Feeling of Stress : Not on file  Social Connections:   . Frequency of Communication with Friends and Family: Not on file  . Frequency of Social Gatherings with Friends and Family: Not on file  . Attends Religious Services: Not on file  . Active Member of Clubs or Organizations: Not on file  . Attends Archivist Meetings: Not on file  . Marital Status: Not on file   Family History  Problem Relation Age of Onset  .  Other Mother        died in her 47's - 'just got sick.'  . Other Father        died @ 2, unknown cause.  . Diabetes Brother   . Other Other        12 siblings + 7 more 1/2 siblings.   Scheduled Meds: . amLODipine  5 mg Oral Daily  . Chlorhexidine Gluconate Cloth  6 each Topical Q0600  . feeding supplement (ENSURE ENLIVE)  237 mL Oral BID BM  . feeding supplement (NEPRO CARB STEADY)  237 mL Oral TID BM  . insulin aspart  0-9 Units Subcutaneous TID WC  . latanoprost  1 drop Both Eyes QHS  . metoprolol succinate  25 mg Oral Daily  . pravastatin  40 mg Oral q1800  . sodium chloride flush  10-40 mL Intracatheter Q12H  . Warfarin - Pharmacist Dosing Inpatient   Does not apply q1800   Continuous Infusions: . sodium chloride    . sodium chloride     PRN Meds:.sodium chloride, sodium chloride,  acetaminophen **OR** acetaminophen, alteplase, heparin, hydrALAZINE, Ipratropium-Albuterol, lidocaine, ondansetron **OR** ondansetron (ZOFRAN) IV, sodium chloride flush Medications Prior to Admission:  Prior to Admission medications   Medication Sig Start Date End Date Taking? Authorizing Provider  digoxin (LANOXIN) 0.125 MG tablet Take 0.0625 mg by mouth daily.    Yes [provider]  glipiZIDE (GLUCOTROL XL) 10 MG 24 hr tablet Take 10 mg by mouth 2 (two) times daily with a meal.    Yes [provider]  ACCU-CHEK AVIVA PLUS test strip TO CHECK BLOOD GLUCOSE ONCE DAILY **DX CODE: E11.9** 01/30/18   [provider]  ACCU-CHEK SOFTCLIX LANCETS lancets USE TO CHECK BLOOD GLUCOSE ONCE DAILY **DX CODE: E11.9** 01/30/18   [provider]  Blood Glucose Monitoring Suppl (ACCU-CHEK AVIVA PLUS) w/Device KIT TO CHECK BLOOD GLUCOSE ONCE DAILY **DX CODE: E11.9** 01/30/18   [provider]  cholecalciferol (VITAMIN D) 1000 units tablet Take 1,000 Units by mouth daily.    [provider]  feeding supplement, GLUCERNA SHAKE, (GLUCERNA SHAKE) LIQD Take 237 mLs by mouth daily.    [provider]  furosemide (LASIX) 40 MG tablet Take 40 mg by mouth daily.    [provider]  latanoprost (XALATAN) 0.005 % ophthalmic solution Place 1 drop into both eyes at bedtime.    [provider]  lovastatin (MEVACOR) 40 MG tablet Take 1 tablet (40 mg total) by mouth at bedtime. 11/15/17   Roxan Hockey, MD  metoprolol succinate (TOPROL-XL) 100 MG 24 hr tablet Take 1 tablet (100 mg total) by mouth daily. Take with or immediately following a meal. 11/25/17   Rai, Ripudeep K, MD  polyethylene glycol (MIRALAX / GLYCOLAX) packet Take 17 g by mouth daily as needed for mild constipation. 09/29/17   Lavina Hamman, MD  warfarin (COUMADIN) 3 MG tablet Take 3-6 mg by mouth as directed.     [provider]   No Known Allergies Review of Systems    Constitutional: Positive for activity change, appetite change and fatigue.   Physical Exam Vitals and nursing note reviewed.  HENT:     Head: Normocephalic.     Nose: Nose normal.  Eyes:     Pupils: Pupils are equal, round, and reactive to light.  Cardiovascular:     Rate and Rhythm: Normal rate. Rhythm irregular.     Pulses: Normal pulses.  Pulmonary:     Effort: Pulmonary effort is  normal.  Abdominal:     Palpations: Abdomen is soft.  Skin:    General: Skin is warm and dry.     Capillary Refill: Capillary refill takes less than 2 seconds.  Neurological:     Mental Status: She is alert and oriented to person, place, and time.  Psychiatric:        Mood and Affect: Mood normal.        Thought Content: Thought content normal.    Vital Signs: BP (!) 140/96   Pulse 80   Temp 97.6 F (36.4 C) (Axillary)   Resp 18   Ht 5' 2" (1.575 m)   Wt 71.4 kg   SpO2 100%   BMI 28.79 kg/m  Pain Scale: 0-10 POSS *See Group Information*: 1-Acceptable,Awake and alert Pain Score: 0-No pain  SpO2: SpO2: 100 % O2 Device:SpO2: 100 % O2 Flow Rate: .O2 Flow Rate (L/min): 2 L/min  IO: Intake/output summary:   Intake/Output Summary (Last 24 hours) at 07/15/2019 0838 Last data filed at 07/15/2019 0535 Gross per 24 hour  Intake 120 ml  Output --  Net 120 ml    LBM: Last BM Date: 07/07/19 Baseline Weight: Weight: 66.3 kg Most recent weight: Weight: 71.4 kg     Palliative Assessment/Data: 40%   Time In: 0800 Time Out:0915 Time Total: 75 Greater than 50%  of this time was spent counseling and coordinating care related to the above assessment and plan.  Signed by: Rosezella Rumpf, NP   Please contact Palliative Medicine Team phone at [phone] for questions and concerns.  For individual provider: See Shea Evans

## 2019-07-15 NOTE — Care Management (Signed)
Could not reach patient on the phone. Spoke to her son Patricia Randall who states that he has COVID too. He said the house is in really bad shape, and he wanted her to go to a SNF. I explained that with her terminal prognosis and not being a candidate for outpatient HD she would be more appropriate for residential hospice services. He is agreeable to [facility] hospice. Referral made to liaison.

## 2019-07-15 NOTE — Progress Notes (Signed)
PROGRESS NOTE    Patricia Randall  [id number] DOB: 02/16/1929 DOA: 07/04/2019 PCP: Leonard Downing, MD   Brief Narrative:  86 year old female patient with PMH of atrial fibrillation on Warfarin, chronic systolic CHF, LVEF [id number] by TTE 09/27/2017, hyperlipidemia, hypertension, type II DM, stage IV CKD (baseline creatinine ~1.8), presented to the ER due to increasing fatigue, weakness, poor appetite, decreased oral intake, decreased mobility and laying in the bed all the time for the last 3 days.  Transiently in A. fib with RVR in the ED, improved after IV fluids.  Admitted for acute on chronic kidney disease due to COVID-19 infection with associated poor oral intake and dehydration.  Despite IV hydration, acute kidney injury continues to worsen, hyperkalemia, metabolic acidosis, suspect some uremia and volume overloaded. Patient/family open for short-term HD as per Nephrologist discussion with them on the weekend but patient not a long-term HD candidate.  HD initiated 07/10/2019.  Intermittently having a difficult time tolerating hemodialysis and also poor p.o. intake.  Palliative care team consulted to help establish goals of care.   Assessment & Plan:   Principal Problem:   COVID-19 virus infection Active Problems:   Permanent atrial fibrillation (HCC)   Cardiomyopathy ([place])   Essential hypertension, benign   Type II diabetes mellitus ([place])   DM (diabetes mellitus), type 2 with renal complications ([place])   AKI (acute kidney injury) ([place])   ESRD (end stage renal disease) ([place])   Palliative care by specialist   DNR (do not resuscitate) discussion   DNR (do not resuscitate)   Goals of care, counseling/discussion   Muscular weakness   Failure to thrive in adult  ESRD on hemodialysis, new diagnosis History of CKD stage V, progressively worsening Metabolic derangement -HD catheter placed by IR 07/09/2018.  For session dialysis 07/09/2018.  Having difficulty tolerating dialysis. -Poor  Longterm HD candidate.  -Sodium bicarb twice daily  COVID-19 infection; stable.  -Completed remdesivir 1/18.  Currently on room air.  Continue supportive care  Paroxysmal atrial fibrillation Intermittent pause, resolved -Digoxin discontinued.  Toprol-XL 50 mg daily -Coumadin, pharmacy to dose  Chronic congestive heart failure with reduced ejection fraction, 30% -Currently on dialysis.-Difficult time tolerating dialysis. No acute signs of Decompensation.   Essential hypertension -Norvasc 10 mg daily; Toprol-XL 50 mg daily.  Okay to hold prior to dialysis  Hyperlipidemia -Continue statin  Diabetes mellitus type 2 with renal complications -Currently uncontrolled due to being on steroids.  Continue insulin sliding scale and Accu-Chek.  Adjust as necessary  Goals of care discussion -Overall poor prognosis especially if she has difficult time tolerating dialysis.  Palliative care consulted- appreciate their input. Hospice care team consulted.   DVT prophylaxis: Coumadin Code Status: Full code Family Communication: None today But son has been updated by the providers daily.  Disposition Plan: Currently getting prn HD while here, but not a good long term candidate. With the help of discussion with Palliative care team, Hospice has been consulted.   Consultants:   Nephrology  Palliative care  Procedures:   Dialysis catheter placement 1/19  Antimicrobials:   None   Subjective: Continues to have poor oral intake. No desire to eat. No other complaints.   Review of Systems Otherwise negative except as per HPI, including: General = no fevers, chills, dizziness,  fatigue HEENT/EYES = negative for loss of vision, double vision, blurred vision,  sore throa Cardiovascular= negative for chest pain, palpitation Respiratory/lungs= negative for shortness of breath, cough, wheezing; hemoptysis,  Gastrointestinal= negative for nausea, vomiting, abdominal  pain Genitourinary= negative  for Dysuria MSK = Negative for arthralgia, myalgias Neurology= Negative for headache, numbness, tingling  Psychiatry= Negative for suicidal and homocidal ideation Skin= Negative for Rash   Objective: Vitals:   07/15/19 0513 07/15/19 0547 07/15/19 0800 07/15/19 1004  BP: (!) 140/96  122/85 122/85  Pulse: 80  67 84  Resp: 18  (!) 23   Temp:  97.6 F (36.4 C)    TempSrc:  Axillary    SpO2: 100%     Weight: 71.4 kg     Height:        Intake/Output Summary (Last 24 hours) at 07/15/2019 1220 Last data filed at 07/15/2019 0535 Gross per 24 hour  Intake 120 ml  Output --  Net 120 ml   Filed Weights   07/11/19 0414 07/13/19 0645 07/15/19 0513  Weight: 69.3 kg 80.6 kg 71.4 kg    Examination:  Constitutional: Cachectic elderly frail. Temporal wasting. Chronically Ill appearing.  Respiratory: Clear to auscultation bilaterally Cardiovascular: Normal sinus rhythm, no rubs Abdomen: Nontender nondistended good bowel sounds Musculoskeletal: No edema noted Skin: No rashes seen Neurologic: CN 2-12 grossly intact.  And nonfocal Psychiatric: Normal judgment and insight. Alert and oriented x 3. Normal mood.   Dialysis catheter noted in her right neck area without any evidence of active bleeding.   Data Reviewed:   CBC: Recent Labs  Lab 07/10/19 0645 07/12/19 0352 07/13/19 0359 07/14/19 0310 07/15/19 0248  WBC 5.0 5.8 5.0 5.5 7.1  HGB 15.8* 14.8 14.1 14.1 15.3*  HCT 47.5* 44.0 41.6 40.4 45.5  MCV 83.6 83.3 82.7 82.4 82.4  PLT 291 304 251 230 [id number]   Basic Metabolic Panel: Recent Labs  Lab 07/11/19 0512 07/12/19 0352 07/13/19 0359 07/14/19 0310 07/15/19 0248  NA 138 141 137 136 138  K 3.5 4.3 3.7 3.5 4.7  CL 101 100 98 98 100  CO2 20* 19* 23 24 24   GLUCOSE 108* 162* 137* 174* 134*  BUN 60* 84* 53* 67* 36*  CREATININE 5.04* 7.26* 6.33* 7.42* 5.23*  CALCIUM 7.3* 7.1* 7.0* 7.1* 7.5*  MG  --  2.1 1.9 2.0 1.8  PHOS 4.8* 9.2* 6.9* 7.0* 4.5   GFR: Estimated Creatinine  Clearance: 6.9 mL/min (A) (by C-G formula based on SCr of 5.23 mg/dL (H)). Liver Function Tests: Recent Labs  Lab 07/09/19 0328 07/10/19 0645 07/11/19 0512 07/12/19 0352 07/13/19 0359 07/14/19 0310 07/15/19 0248  AST 29  --   --   --   --   --   --   ALT 13  --   --   --   --   --   --   ALKPHOS 62  --   --   --   --   --   --   BILITOT 1.2  --   --   --   --   --   --   PROT 5.5*  --   --   --   --   --   --   ALBUMIN 1.7*   < > 1.6* 1.6* 1.5* 1.6* 1.7*   < > = values in this interval not displayed.   No results for input(s): LIPASE, AMYLASE in the last 168 hours. No results for input(s): AMMONIA in the last 168 hours. Coagulation Profile: Recent Labs  Lab 07/11/19 0512 07/12/19 0352 07/13/19 0359 07/14/19 0310 07/15/19 0248  INR 5.3* 5.0* 4.6* 4.5* 3.3*   Cardiac Enzymes: No results for input(s): CKTOTAL, CKMB, CKMBINDEX, TROPONINI  in the last 168 hours. BNP (last 3 results) No results for input(s): PROBNP in the last 8760 hours. HbA1C: No results for input(s): HGBA1C in the last 72 hours. CBG: Recent Labs  Lab 07/14/19 0759 07/14/19 1656 07/14/19 2207 07/15/19 0747 07/15/19 1213  GLUCAP 152* 156* 126* 120* 178*   Lipid Profile: No results for input(s): CHOL, HDL, LDLCALC, TRIG, CHOLHDL, LDLDIRECT in the last 72 hours. Thyroid Function Tests: No results for input(s): TSH, T4TOTAL, FREET4, T3FREE, THYROIDAB in the last 72 hours. Anemia Panel: No results for input(s): VITAMINB12, FOLATE, FERRITIN, TIBC, IRON, RETICCTPCT in the last 72 hours. Sepsis Labs: No results for input(s): PROCALCITON, LATICACIDVEN in the last 168 hours.  Recent Results (from the past 240 hour(s))  Urine culture     Status: Abnormal   Collection Time: 07/06/19  8:23 PM   Specimen: Urine, Clean Catch  Result Value Ref Range Status   Specimen Description URINE, CLEAN CATCH  Final   Special Requests   Final    NONE Performed at [facility], [address], [place], [place]  [id number]    Culture MULTIPLE SPECIES PRESENT, SUGGEST RECOLLECTION (A)  Final   Report Status 07/08/2019 FINAL  Final         Radiology Studies: No results found.      Scheduled Meds: . amLODipine  5 mg Oral Daily  . Chlorhexidine Gluconate Cloth  6 each Topical Q0600  . feeding supplement (ENSURE ENLIVE)  237 mL Oral BID BM  . feeding supplement (NEPRO CARB STEADY)  237 mL Oral TID BM  . insulin aspart  0-9 Units Subcutaneous TID WC  . latanoprost  1 drop Both Eyes QHS  . metoprolol succinate  25 mg Oral Daily  . pravastatin  40 mg Oral q1800  . sodium chloride flush  10-40 mL Intracatheter Q12H  . Warfarin - Pharmacist Dosing Inpatient   Does not apply q1800   Continuous Infusions: . sodium chloride    . sodium chloride       LOS: 10 days   Time spent= 20 mins    Fermin Yan Arsenio Loader, MD [facility]  If 7PM-7AM, please contact night-coverage  07/15/2019, 12:20 PM

## 2019-07-15 NOTE — Progress Notes (Signed)
Geneticist, molecular received referral for pt to transfer to [facility] for EOL care. Pt's chart currently in review by hospice staff to determine eligibility for [facility]. Writer attempted to call pt's hospital room but pt did not answer.   Writer spoke with pt's son to confirm interest. Answered all questions and explained hospice philosophy. Explained that Byron will inform him if pt is approved, as well as hospital staff.   Please do not hesitate to outreach with any questions and thank you for the referral.   Freddie Breech, RN [facility] Liaison [phone]

## 2019-07-15 NOTE — Evaluation (Signed)
Physical Therapy Evaluation Patient Details Name: Patricia Randall MRN: [id number] DOB: January 24, 1929 Today's Date: 07/15/2019   History of Present Illness  86 year old female patient with PMH of atrial fibrillation on Warfarin, chronic systolic CHF, LVEF [id number] by TTE 09/27/2017, hyperlipidemia, hypertension, type II DM, stage IV CKD (baseline creatinine ~1.8), presented to the ER due to increasing fatigue, weakness, poor appetite, decreased oral intake, decreased mobility and laying in the bed all the time for the last 3 days. +COVID, dehydration, AKI; started temporary/short-term HD with understanding that she is not a long-term HD candidate. Palliative consulted and recommending Hospice services upon discharge.   Clinical Impression   Pt admitted with above diagnosis. Noted ongoing discussion with Palliative care with no plans for long-term dialysis. Patient became dizzy sitting at EOB with drop in SBP 147-129. She requested return to supine. Patient at one point acknowledged what was discussed with Palliative Care, but then shortly after stating "I'm going to be OK. I'm going to go home and take care of my family." Pt currently with functional limitations due to the deficits listed below (see PT Problem List). Pt may benefit from PT if her kidney function improves and able to stabilize her BP. Will follow pt for a trial of PT in case she does improve and refuses Hospice care.     Follow Up Recommendations Other (comment)(TBA--if d/c'g with Hospice no further PT; may need SNF if refuses Hospice and showing improvement)    Equipment Recommendations  None recommended by PT    Recommendations for Other Services       Precautions / Restrictions Precautions Precautions: Fall      Mobility  Bed Mobility Overal bed mobility: Needs Assistance Bed Mobility: Supine to Sit;Sit to Supine     Supine to sit: Mod assist;HOB elevated Sit to supine: Min assist   General bed mobility comments: pt able to  raise her torso to sitting, however could not then scoot out to EOB without mod assist; +2 total to scoot to [facility]  Transfers                 General transfer comment: pt reporting feeling worse once sitting and requested return to supine (+drop in BP)  Ambulation/Gait                Stairs            Wheelchair Mobility    Modified Rankin (Stroke Patients Only)       Balance Overall balance assessment: Needs assistance Sitting-balance support: Bilateral upper extremity supported;Feet unsupported Sitting balance-Leahy Scale: Poor                                       Pertinent Vitals/Pain Pain Assessment: No/denies pain    Home Living Family/patient expects to be discharged to:: Private residence Living Arrangements: Children(son (on HD, recent CABG)) Available Help at Discharge: Family;Available PRN/intermittently Type of Home: House Home Access: Stairs to enter Entrance Stairs-Rails: Right Entrance Stairs-Number of Steps: 3 Home Layout: One level Home Equipment: Cane - single point Additional Comments: Pt reports she does not usually use her cane    Prior Function Level of Independence: Independent         Comments: has had recent decline and stayed in the bed for days     Hand Dominance   Dominant Hand: Right    Extremity/Trunk Assessment   Upper Extremity Assessment Upper  Extremity Assessment: Defer to OT evaluation    Lower Extremity Assessment Lower Extremity Assessment: Generalized weakness    Cervical / Trunk Assessment Cervical / Trunk Assessment: Other exceptions Cervical / Trunk Exceptions: obese  Communication   Communication: No difficulties  Cognition Arousal/Alertness: Awake/alert Behavior During Therapy: WFL for tasks assessed/performed Overall Cognitive Status: Difficult to assess                                 General Comments: initially appeared to understand earlier discussion  with Palliative Care, but then stating, "oh I'm going to be just fine. My family doesn't need to worry. I'm going to be fine"       General Comments      Exercises     Assessment/Plan    PT Assessment Patient needs continued PT services  PT Problem List Decreased strength;Decreased activity tolerance;Decreased balance;Decreased mobility;Decreased knowledge of use of DME;Cardiopulmonary status limiting activity       PT Treatment Interventions DME instruction;Gait training;Functional mobility training;Therapeutic activities;Therapeutic exercise;Balance training;Patient/family education    PT Goals (Current goals can be found in the Care Plan section)  Acute Rehab PT Goals Patient Stated Goal: to get better and go home to take care of her family PT Goal Formulation: With patient Time For Goal Achievement: 07/29/19 Potential to Achieve Goals: Poor    Frequency Min 2X/week   Barriers to discharge Decreased caregiver support;Inaccessible home environment son is not in good health per pt    Co-evaluation               AM-PAC PT "6 Clicks" Mobility  Outcome Measure Help needed turning from your back to your side while in a flat bed without using bedrails?: None Help needed moving from lying on your back to sitting on the side of a flat bed without using bedrails?: A Lot Help needed moving to and from a bed to a chair (including a wheelchair)?: A Lot Help needed standing up from a chair using your arms (e.g., wheelchair or bedside chair)?: A Lot Help needed to walk in hospital room?: Total Help needed climbing 3-5 steps with a railing? : Total 6 Click Score: 12    End of Session   Activity Tolerance: Treatment limited secondary to medical complications (Comment) Patient left: in bed;with call bell/phone within reach;with bed alarm set Nurse Communication: Mobility status;Other (comment)(drop in BP at EOB) PT Visit Diagnosis: Muscle weakness (generalized) (M62.81)     Time: [id number] PT Time Calculation (min) (ACUTE ONLY): 34 min   Charges:   PT Evaluation $PT Eval Moderate Complexity: 1 Mod PT Treatments $Therapeutic Activity: 8-22 mins         Arby Barrette, PT Pager [phone]   Rexanne Mano 07/15/2019, 5:53 PM

## 2019-07-15 NOTE — Progress Notes (Signed)
ANTICOAGULATION CONSULT NOTE - Follow up Pharmacy Consult for Warfarin Indication: atrial fibrillation  No Known Allergies  Patient Measurements: Height: 5\' 2"  (157.5 cm) Weight: 157 lb 6.5 oz (71.4 kg) IBW/kg (Calculated) : 50.1   Vital Signs: Temp: 97.6 F (36.4 C) (01/24 0547) Temp Source: Axillary (01/24 0547) BP: 140/96 (01/24 0513) Pulse Rate: 80 (01/24 0513)  Labs: Recent Labs    07/13/19 0359 07/13/19 0359 07/14/19 0310 07/15/19 0248  HGB 14.1   < > 14.1 15.3*  HCT 41.6  --  40.4 45.5  PLT 251  --  230 273  LABPROT 43.5*  --  42.8* 33.7*  INR 4.6*  --  4.5* 3.3*  CREATININE 6.33*  --  7.42* 5.23*   < > = values in this interval not displayed.    Estimated Creatinine Clearance: 6.9 mL/min (A) (by C-G formula based on SCr of 5.23 mg/dL (H)).   Assessment: 86 yo female on warfarin for afib (CHADS2VASc = 7). Followed by Dr. Claris Gower [facility], [phone], who's staff said patient was last seen 06/11/19 and instructed to take 6mg -3mg -3mg -3mg  with follow up on 06/25/19, which the patient missed. Per patient, she's taking warfarin 6mg -6mg -3mg -3mg -3mg -6mg . Unclear from both sources which days of the week are 3mg  vs 6mg . Confirmed she took warfarin on 07/03/19 before admission. D-dimer < 5.   INR 3.3, remains supratherapeutic but is trending down. Poor PO at least 3 days before admission and continues to have poor intake. CBC stable. No reported bleeding. Last warfarin dose received was 0.5mg  on 1/16.   PTA Warfarin: 3-6mg , unclear exact dose but likely 3mg  for 3-4 days/wk and 6mg  all other days.  Goal of Therapy:  INR 2-3 Monitor platelets by anticoagulation protocol: Yes   Plan:  Hold warfarin tonight  Monitor INR, CBC and S/S of bleeding daily    Cristela Felt, PharmD PGY1 Pharmacy Resident [employer]: [phone]  07/15/2019 8:10 AM

## 2019-07-16 LAB — RENAL FUNCTION PANEL
Albumin: 1.5 g/dL — ABNORMAL LOW (ref 3.5–5.0)
Anion gap: 13 (ref 5–15)
BUN: 43 mg/dL — ABNORMAL HIGH (ref 8–23)
CO2: 26 mmol/L (ref 22–32)
Calcium: 7.4 mg/dL — ABNORMAL LOW (ref 8.9–10.3)
Chloride: 97 mmol/L — ABNORMAL LOW (ref 98–111)
Creatinine, Ser: 6.53 mg/dL — ABNORMAL HIGH (ref 0.44–1.00)
GFR calc Af Amer: 6 mL/min — ABNORMAL LOW (ref >60)
GFR calc non Af Amer: 5 mL/min — ABNORMAL LOW (ref >60)
Glucose, Bld: 155 mg/dL — ABNORMAL HIGH (ref 70–99)
Phosphorus: 4.8 mg/dL — ABNORMAL HIGH (ref 2.5–4.6)
Potassium: 3.9 mmol/L (ref 3.5–5.1)
Sodium: 136 mmol/L (ref 135–145)

## 2019-07-16 LAB — CBC
HCT: 41.2 % (ref 36.0–46.0)
Hemoglobin: 13.6 g/dL (ref 12.0–15.0)
MCH: 27.7 pg (ref 26.0–34.0)
MCHC: 33 g/dL (ref 30.0–36.0)
MCV: 83.9 fL (ref 80.0–100.0)
Platelets: 240 10*3/uL (ref 150–400)
RBC: 4.91 MIL/uL (ref 3.87–5.11)
RDW: 15.5 % (ref 11.5–15.5)
WBC: 7.2 10*3/uL (ref 4.0–10.5)
nRBC: 0 % (ref 0.0–0.2)

## 2019-07-16 LAB — GLUCOSE, CAPILLARY
Glucose-Capillary: 139 mg/dL — ABNORMAL HIGH (ref 70–99)
Glucose-Capillary: 246 mg/dL — ABNORMAL HIGH (ref 70–99)
Glucose-Capillary: 148 mg/dL — ABNORMAL HIGH (ref 70–99)
Glucose-Capillary: 142 mg/dL — ABNORMAL HIGH (ref 70–99)

## 2019-07-16 LAB — PROTIME-INR
INR: 2.7 — ABNORMAL HIGH (ref 0.8–1.2)
Prothrombin Time: 28.7 seconds — ABNORMAL HIGH (ref 11.4–15.2)

## 2019-07-16 LAB — MAGNESIUM: Magnesium: 1.8 mg/dL (ref 1.7–2.4)

## 2019-07-16 MED ORDER — WARFARIN SODIUM 4 MG PO TABS
4.0000 mg | ORAL_TABLET | Freq: Once | ORAL | Status: AC
  Administered 2019-07-16: 17:00:00 4 mg via ORAL
  Filled 2019-07-16: qty 1

## 2019-07-16 NOTE — Progress Notes (Signed)
Palliative Medicine Inpatient Follow Up Note   HPI: Per hospitalist note --> 86 year old female patient with PMH of atrial fibrillation on Warfarin, chronic systolic CHF, LVEF 94-70% by TTE 09/27/2017, hyperlipidemia, hypertension, type II DM, stage IV CKD (baseline creatinine ~1.8), presented to the ER due to increasing fatigue, weakness, poor appetite, decreased oral intake, decreased mobility and laying in the bed all the time for the last 3 days. Transiently in A. fib with RVR in the ED, improved after IV fluids. Admitted for acute on chronic kidney disease due to COVID-19 infection with associated poor oral intake and dehydration. Despite IV hydration, acute kidney injury continues to worsen, hyperkalemia, metabolic acidosis, suspect some uremia and volume overloaded. Patient/family open for short-term HD as per Nephrologist discussion with them on the weekend but patient not a long-term HD candidate. HD initiated 07/10/2019. Intermittently having a difficult time tolerating hemodialysis and also poor p.o. intake. Palliative care team consulted to help establish goals of care  Today's Discussion (07/16/2019): Chart reviewed. Met with miss Koslow at bedside. She said that she wants to rehabilitate. I told her that I wish that things were different but the medical team is concerned that she has limited time left once she stops hemodialysis. She said that she didn't know she was that bad off. I shared with her that we are likely only looking a few weeks left of life. We talked about miracles and if something miraculous could occur to make her kidneys start working again. I said that we don't know God's plan, but we can always hope. We discussed what life would look like if she went to a SNF and the likelihood that she would not be able to participate in rehabilitation overtime as toxins build up in her body leading to her becoming more lethargic. We talked about how her family would not be able to  visit her in the SNF setting. Shared concerns that if she went there she would likely be readmitted to the hospital in a short time and place her body through additional trauma.We then talked about transition to [facility] place whereby the staff would focus on symptom relief and enable her to enjoy everyday she has left as best as able. This would include her family visiting, enjoying the foods she likes, and listening to her favorite music. The and I talked for about 45 minutes about these different options. I shared that our goal is to respect her decision. She said that she would be willing to go to [facility] but not today as it would be "too much for her." I told her that I understood and would bring her concerns up to the primary team. She asked me to call her son [name] after to discuss this.  I called [name] after our conversation. Explained the limited prognosis which he now seems to understand. He said that he is sick and just had bypass. He expressed concerns that his mother doesn't have a life Set designer and he "doesn't know where anything is." Offered emotional support for him as he seemed very overwhelmed. He now is also amenable to transfer to hospice home tomorrow.   Discussed with patient the importance of continued conversation with family and their  medical providers regarding overall plan of care and treatment options, ensuring decisions are within the context of the patients values and GOCs.  Questions and concerns addressed   Vital Signs Vitals:   07/16/19 0558 07/16/19 1244  BP: 129/89 126/75  Pulse: 75 80  Resp: 20   Temp: 97.8 F (36.6 C) 97.8 F (36.6 C)  SpO2: 96% 98%    Intake/Output Summary (Last 24 hours) at 07/16/2019 1410 Last data filed at 07/16/2019 0544 Gross per 24 hour  Intake 120 ml  Output --  Net 120 ml   Last Weight  Most recent update: 07/16/2019  6:45 AM   Weight  69.2 kg (152 lb 8.9 oz)           Physical Exam Vitals and nursing note  reviewed.  HENT:     Head: Normocephalic.     Nose: Nose normal.  Eyes:     Pupils: Pupils are equal, round, and reactive to light.  Cardiovascular:     Rate and Rhythm: Normal rate. Rhythm irregular.     Pulses: Normal pulses.  Pulmonary:     Effort: Pulmonary effort is normal.  Abdominal:     Palpations: Abdomen is soft.  Skin:    General: Skin is warm and dry.     Capillary Refill: Capillary refill takes less than 2 seconds.  Neurological:     Mental Status: She is alert and oriented to person, place, and time.  Psychiatric:        Mood and Affect: Mood normal.        Thought Content: Thought content normal.   SUMMARY OF RECOMMENDATIONS   DNAR/DNI Patient realizes that she will not get HD when she discharges and her like expectancy will be days to weeks Hospice consulted, plan for transition to [facility]  Time Spent: 65 Greater than 50% of the time was spent in counseling and coordination of care ______________________________________________________________________________________ [place] Team Team Cell Phone: [phone] Please utilize secure chat with additional questions, if there is no response within 30 minutes please call the above phone number  Palliative Medicine Team providers are available by phone from 7am to 7pm daily and can be reached through the team cell phone.  Should this patient require assistance outside of these hours, please call the patient's attending physician.

## 2019-07-16 NOTE — Progress Notes (Signed)
Is to be transferred to [facility] not a candidate for long-term dialysis.  Will sign off patient at this point and allow follow-up with hospice services appreciate assistance from [facility].

## 2019-07-16 NOTE — Progress Notes (Addendum)
Manufacturing engineer [facility]) Hospital Liaison Note  Spoke to patient son to confirm interest and offer a [facility] bed. After several conversations today, patient son has verbalized that patient's wish and his own are to decline hospice and the [facility] bed for today. Patient son stated " they are not ready to pursue comfort care only now". Patient son declined to talk further about [facility] or involving other family members in conversation.   Made TOC aware of above.  Please call patient and son want to consider [facility] services,  Gar Ponto, RN [facility] Liaison  [phone]   UPDATE at 1700  : After PMT discussion with patient and son this afternoon - Patient son verbalized via telephone that patient and family were agreeable to consider transfer to [facility] if bed is available tomorrow. [place] will reach out to [facility] in morning about bed availability.

## 2019-07-16 NOTE — Evaluation (Signed)
Occupational Therapy Evaluation Patient Details Name: Patricia Randall MRN: [id number] DOB: [birth year] Today's Date: 07/16/2019    History of Present Illness 86 year old female patient with PMH of atrial fibrillation on Warfarin, chronic systolic CHF, LVEF 99991111 by TTE 09/27/2017, hyperlipidemia, hypertension, type II DM, stage IV CKD (baseline creatinine ~1.8), presented to the ER due to increasing fatigue, weakness, poor appetite, decreased oral intake, decreased mobility and laying in the bed all the time for the last 3 days. +COVID, dehydration, AKI; started temporary/short-term HD with understanding that she is not a long-term HD candidate. Palliative consulted and recommending Hospice services upon discharge.    Clinical Impression   This 86 y/o female presents with the above. PTA pt reports she was performing ADL and functional mobility at independent level. Pt now presenting with pain, overall weakness, decreased mobility and functional status. Pt requiring mod-maxA for bed mobility during session, only able to tolerate sitting EOB for short period of time (<5 min) before requesting to lay back down (pt would not specify reason, simply stating "I just need to lay down). Max hr noted 120 with bed mobility and seated activity. She currently requires up to [facility] for UB ADL given increased pain and decreased ROM in LUE (pt reports due to gout), max-totalA for LB ADL. Per chart pt may likely d/c to residential hospice, will continue to follow for acute OT services until dispo plan is confirmed.     Follow Up Recommendations  Other (comment);Supervision/Assistance - 24 hour(per chart likely plans for hospice)    Equipment Recommendations  Other (comment)(defer to next venue)           Precautions / Restrictions Precautions Precautions: Fall Restrictions Weight Bearing Restrictions: No      Mobility Bed Mobility Overal bed mobility: Needs Assistance Bed Mobility: Supine to Sit;Sit to Supine     Supine to sit: Mod assist;HOB elevated Sit to supine: Max assist   General bed mobility comments: requires assist for trunk elevation and to scoot towards EOB, limited with UE assist given painful LUE  Transfers                 General transfer comment: deferred as pt reporting "I need to lie back down" once seated EOB    Balance Overall balance assessment: Needs assistance Sitting-balance support: Bilateral upper extremity supported;Feet unsupported Sitting balance-Leahy Scale: Poor Sitting balance - Comments: minA for sitting balance                                   ADL either performed or assessed with clinical judgement   ADL Overall ADL's : Needs assistance/impaired Eating/Feeding: Set up;Sitting;Bed level Eating/Feeding Details (indicate cue type and reason): setup with drink container end of session Grooming: Minimal assistance;Bed level;Sitting   Upper Body Bathing: Minimal assistance;Moderate assistance;Bed level;Sitting   Lower Body Bathing: Maximal assistance;Sitting/lateral leans   Upper Body Dressing : Minimal assistance;Moderate assistance;Sitting   Lower Body Dressing: Maximal assistance;Sitting/lateral leans       Toileting- Clothing Manipulation and Hygiene: Total assistance         General ADL Comments: pt only able to tolerate sitting EOB for short period of time, reporting "I want to lie back down, I just need to lay down" , presenting with overall weakness, UE deficits, impaired mobility/functional staus, HR up to 120 with bed mobility      Vision         Perception  Praxis      Pertinent Vitals/Pain Pain Assessment: Faces Faces Pain Scale: Hurts even more Pain Location: LUE (shoulder), bil feet intermittently, reports mostly due to gout  Pain Descriptors / Indicators: Discomfort;Grimacing;Sore Pain Intervention(s): Limited activity within patient's tolerance;Monitored during session;Repositioned     Hand  Dominance Right   Extremity/Trunk Assessment Upper Extremity Assessment Upper Extremity Assessment: LUE deficits/detail LUE Deficits / Details: grossly 2-/5, ROM limited due to pain (pt reports gout), most notably in L shoulder, edemous UE LUE: Unable to fully assess due to pain LUE Coordination: decreased fine motor;decreased gross motor   Lower Extremity Assessment Lower Extremity Assessment: Defer to PT evaluation   Cervical / Trunk Assessment Cervical / Trunk Assessment: Other exceptions Cervical / Trunk Exceptions: obese   Communication Communication Communication: No difficulties   Cognition Arousal/Alertness: Awake/alert Behavior During Therapy: WFL for tasks assessed/performed Overall Cognitive Status: No family/caregiver present to determine baseline cognitive functioning                                 General Comments: waxing/waning cognition during session, overall WFL for simple tasks and questions   General Comments  HR noted up to 120 with activity     Exercises Exercises: General Lower Extremity;General Upper Extremity General Exercises - Upper Extremity Elbow Flexion: AROM;AAROM;Both;10 reps Elbow Extension: AROM;AAROM;Both;10 reps Wrist Flexion: AROM;Right;5 reps Wrist Extension: AROM;Right;5 reps Digit Composite Flexion: AROM;Both;10 reps Composite Extension: AROM;Both;10 reps General Exercises - Lower Extremity Straight Leg Raises: AROM;Both;5 reps   Shoulder Instructions      Home Living Family/patient expects to be discharged to:: Private residence Living Arrangements: Children(son (on HD, recent CABG)) Available Help at Discharge: Family;Available PRN/intermittently Type of Home: House Home Access: Stairs to enter [employer] of Steps: 3 Entrance Stairs-Rails: Right Home Layout: One level     Bathroom Shower/Tub: Teacher, early years/pre: Standard     Home Equipment: Cane - single point   Additional  Comments: Pt reports she does not usually use her cane      Prior Functioning/Environment Level of Independence: Independent        Comments: has had recent decline and stayed in the bed for days        OT Problem List: Decreased strength;Decreased range of motion;Decreased activity tolerance;Impaired balance (sitting and/or standing);Decreased safety awareness;Decreased knowledge of use of DME or AE;Cardiopulmonary status limiting activity;Impaired UE functional use;Pain      OT Treatment/Interventions: Self-care/ADL training;Therapeutic exercise;Energy conservation;DME and/or AE instruction;Therapeutic activities;Patient/family education;Balance training    OT Goals(Current goals can be found in the care plan section) Acute Rehab OT Goals Patient Stated Goal: to get better and go home to take care of her family OT Goal Formulation: With patient Time For Goal Achievement: 07/30/19 Potential to Achieve Goals: Fair  OT Frequency: Min 2X/week   Barriers to D/C:            Co-evaluation              AM-PAC OT "6 Clicks" Daily Activity     Outcome Measure Help from another person eating meals?: A Little Help from another person taking care of personal grooming?: A Lot Help from another person toileting, which includes using toliet, bedpan, or urinal?: Total Help from another person bathing (including washing, rinsing, drying)?: A Lot Help from another person to put on and taking off regular upper body clothing?: A Lot Help from another person to put  on and taking off regular lower body clothing?: Total 6 Click Score: 11   End of Session Nurse Communication: Mobility status  Activity Tolerance: Patient tolerated treatment well;Patient limited by fatigue Patient left: in bed;with call bell/phone within reach;with bed alarm set  OT Visit Diagnosis: Muscle weakness (generalized) (M62.81);Other abnormalities of gait and mobility (R26.89);Pain Pain - Right/Left: Left Pain  - part of body: Shoulder;Arm                Time: 1000-1024 OT Time Calculation (min): 24 min Charges:  OT General Charges $OT Visit: 1 Visit OT Evaluation $OT Eval Moderate Complexity: 1 Mod OT Treatments $Self Care/Home Management : 8-22 mins  Lou Cal, OT Supplemental Rehabilitation Services Pager [phone] Office [phone]   Raymondo Band 07/16/2019, 1:16 PM

## 2019-07-16 NOTE — Progress Notes (Signed)
PROGRESS NOTE    Patricia Randall  [id number] DOB: Jan 07, 1929 DOA: 07/04/2019 PCP: Leonard Downing, MD   Brief Narrative:  86 year old female patient with PMH of atrial fibrillation on Warfarin, chronic systolic CHF, LVEF [id number] by TTE 09/27/2017, hyperlipidemia, hypertension, type II DM, stage IV CKD (baseline creatinine ~1.8), presented to the ER due to increasing fatigue, weakness, poor appetite, decreased oral intake, decreased mobility and laying in the bed all the time for the last 3 days.  Transiently in A. fib with RVR in the ED, improved after IV fluids.  Admitted for acute on chronic kidney disease due to COVID-19 infection with associated poor oral intake and dehydration.  Despite IV hydration, acute kidney injury continues to worsen, hyperkalemia, metabolic acidosis, suspect some uremia and volume overloaded. Patient/family open for short-term HD as per Nephrologist discussion with them on the weekend but patient not a long-term HD candidate.  HD initiated 07/10/2019.  Intermittently having a difficult time tolerating hemodialysis and also poor p.o. intake.  Palliative care team consulted to help establish goals of care.   Assessment & Plan:   Principal Problem:   COVID-19 virus infection Active Problems:   Permanent atrial fibrillation (HCC)   Cardiomyopathy ([place])   Essential hypertension, benign   Type II diabetes mellitus ([place])   DM (diabetes mellitus), type 2 with renal complications ([place])   AKI (acute kidney injury) ([place])   ESRD (end stage renal disease) ([place])   Palliative care by specialist   DNR (do not resuscitate) discussion   DNR (do not resuscitate)   Goals of care, counseling/discussion   Muscular weakness   Failure to thrive in adult  ESRD on hemodialysis, new diagnosis History of CKD stage V, progressively worsening Metabolic derangement -HD catheter placed by IR 07/09/2018.  For session dialysis 07/09/2018.  Difficulty tolerating dialysis due to  hypotension. -Poor Longterm HD candidate.  Seen by nephrology -Sodium bicarb twice daily  COVID-19 infection; stable.  -Completed remdesivir 1/18.  Currently on room air.  Continue supportive care  Paroxysmal atrial fibrillation Intermittent pause, resolved -Digoxin discontinued.  Toprol-XL 50 mg daily -Coumadin, pharmacy to dose  Chronic congestive heart failure with reduced ejection fraction, 30% -Currently on dialysis.-Difficult time tolerating dialysis. No acute signs of Decompensation.   Essential hypertension -Norvasc 10 mg daily; Toprol-XL 50 mg daily.  Hold prior to HD every morning  Hyperlipidemia -Continue statin  Diabetes mellitus type 2 with renal complications -Currently uncontrolled due to being on steroids.  Continue insulin sliding scale and Accu-Chek.  Adjust as necessary  Goals of care discussion -Overall poor prognosis, without dialysis life expectancy less than 2 weeks.  Palliative care consulted- appreciate their input. Hospice care team consulted.   DVT prophylaxis: Coumadin Code Status: Full code Family Communication: Spoke with the niece Katharine Look who is an Therapist, sports.  All the questions answered.  Also spoke with [name].  Disposition Plan: Awaiting disposition. Transition to residential hospice once arrangements made.   Consultants:   Nephrology  Palliative care  Procedures:   Dialysis catheter placement 1/19  Antimicrobials:   None   Subjective: No acute events overnight, continues to have poor oral intake.  Review of Systems Otherwise negative except as per HPI, including: General = no fevers, chills, dizziness,  fatigue HEENT/EYES = negative for loss of vision, double vision, blurred vision,  sore throa Cardiovascular= negative for chest pain, palpitation Respiratory/lungs= negative for shortness of breath, cough, wheezing; hemoptysis,  Gastrointestinal= negative for nausea, vomiting, abdominal pain Genitourinary= negative for Dysuria MSK =  Negative for arthralgia, myalgias Neurology= Negative for headache, numbness, tingling  Psychiatry= Negative for suicidal and homocidal ideation Skin= Negative for Rash  Objective: Vitals:   07/15/19 2051 07/15/19 2117 07/16/19 0558 07/16/19 0645  BP:   129/89   Pulse:   75   Resp:   20   Temp: 97.6 F (36.4 C) (!) 97.3 F (36.3 C) 97.8 F (36.6 C)   TempSrc: Oral Oral Oral   SpO2:   96%   Weight:    69.2 kg  Height:        Intake/Output Summary (Last 24 hours) at 07/16/2019 1214 Last data filed at 07/16/2019 0544 Gross per 24 hour  Intake 120 ml  Output --  Net 120 ml   Filed Weights   07/13/19 0645 07/15/19 0513 07/16/19 0645  Weight: 80.6 kg 71.4 kg 69.2 kg    Examination:  Constitutional: Elderly frail. NAD Respiratory: Clear to auscultation bilaterally Cardiovascular: Normal sinus rhythm, no rubs Abdomen: Nontender nondistended good bowel sounds Musculoskeletal: No edema noted Skin: No rashes seen Neurologic: CN 2-12 grossly intact.  And nonfocal Psychiatric: Normal judgment and insight. Alert and oriented x 3. Normal mood.    Dialysis catheter noted in her right neck area without any evidence of active bleeding.   Data Reviewed:   CBC: Recent Labs  Lab 07/12/19 0352 07/13/19 0359 07/14/19 0310 07/15/19 0248 07/16/19 0343  WBC 5.8 5.0 5.5 7.1 7.2  HGB 14.8 14.1 14.1 15.3* 13.6  HCT 44.0 41.6 40.4 45.5 41.2  MCV 83.3 82.7 82.4 82.4 83.9  PLT 304 251 230 273 [id number]   Basic Metabolic Panel: Recent Labs  Lab 07/12/19 0352 07/13/19 0359 07/14/19 0310 07/15/19 0248 07/16/19 0343  NA 141 137 136 138 136  K 4.3 3.7 3.5 4.7 3.9  CL 100 98 98 100 97*  CO2 19* 23 24 24 26   GLUCOSE 162* 137* 174* 134* 155*  BUN 84* 53* 67* 36* 43*  CREATININE 7.26* 6.33* 7.42* 5.23* 6.53*  CALCIUM 7.1* 7.0* 7.1* 7.5* 7.4*  MG 2.1 1.9 2.0 1.8 1.8  PHOS 9.2* 6.9* 7.0* 4.5 4.8*   GFR: Estimated Creatinine Clearance: 5.4 mL/min (A) (by C-G formula based on SCr of  6.53 mg/dL (H)). Liver Function Tests: Recent Labs  Lab 07/12/19 0352 07/13/19 0359 07/14/19 0310 07/15/19 0248 07/16/19 0343  ALBUMIN 1.6* 1.5* 1.6* 1.7* 1.5*   No results for input(s): LIPASE, AMYLASE in the last 168 hours. No results for input(s): AMMONIA in the last 168 hours. Coagulation Profile: Recent Labs  Lab 07/12/19 0352 07/13/19 0359 07/14/19 0310 07/15/19 0248 07/16/19 0343  INR 5.0* 4.6* 4.5* 3.3* 2.7*   Cardiac Enzymes: No results for input(s): CKTOTAL, CKMB, CKMBINDEX, TROPONINI in the last 168 hours. BNP (last 3 results) No results for input(s): PROBNP in the last 8760 hours. HbA1C: No results for input(s): HGBA1C in the last 72 hours. CBG: Recent Labs  Lab 07/15/19 1213 07/15/19 1602 07/15/19 2120 07/16/19 0727 07/16/19 1111  GLUCAP 178* 142* 179* 139* 246*   Lipid Profile: No results for input(s): CHOL, HDL, LDLCALC, TRIG, CHOLHDL, LDLDIRECT in the last 72 hours. Thyroid Function Tests: No results for input(s): TSH, T4TOTAL, FREET4, T3FREE, THYROIDAB in the last 72 hours. Anemia Panel: No results for input(s): VITAMINB12, FOLATE, FERRITIN, TIBC, IRON, RETICCTPCT in the last 72 hours. Sepsis Labs: No results for input(s): PROCALCITON, LATICACIDVEN in the last 168 hours.  Recent Results (from the past 240 hour(s))  Urine culture     Status: Abnormal  Collection Time: 07/06/19  8:23 PM   Specimen: Urine, Clean Catch  Result Value Ref Range Status   Specimen Description URINE, CLEAN CATCH  Final   Special Requests   Final    NONE Performed at [facility], [place] [address], [place], [place] [address]    Culture MULTIPLE SPECIES PRESENT, SUGGEST RECOLLECTION (A)  Final   Report Status 07/08/2019 FINAL  Final         Radiology Studies: No results found.      Scheduled Meds: . amLODipine  5 mg Oral Daily  . Chlorhexidine Gluconate Cloth  6 each Topical Q0600  . feeding supplement (ENSURE ENLIVE)  237 mL Oral BID BM  .  feeding supplement (NEPRO CARB STEADY)  237 mL Oral TID BM  . insulin aspart  0-9 Units Subcutaneous TID WC  . latanoprost  1 drop Both Eyes QHS  . metoprolol succinate  25 mg Oral Daily  . pravastatin  40 mg Oral q1800  . sodium chloride flush  10-40 mL Intracatheter Q12H  . warfarin  4 mg Oral ONCE-1800  . Warfarin - Pharmacist Dosing Inpatient   Does not apply q1800   Continuous Infusions: . sodium chloride    . sodium chloride       LOS: 11 days   Time spent= 20 mins    Rakel Junio Arsenio Loader, MD [facility]  If 7PM-7AM, please contact night-coverage  07/16/2019, 12:14 PM

## 2019-07-16 NOTE — Progress Notes (Signed)
ANTICOAGULATION CONSULT NOTE - Follow Up Consult  Pharmacy Consult for Coumadin Indication: atrial fibrillation  No Known Allergies  Patient Measurements: Height: 5\' 2"  (157.5 cm) Weight: 152 lb 8.9 oz (69.2 kg) IBW/kg (Calculated) : 50.1  Vital Signs: Temp: 97.8 F (36.6 C) (01/25 0558) Temp Source: Oral (01/25 0558) BP: 129/89 (01/25 0558) Pulse Rate: 75 (01/25 0558)  Labs: Recent Labs    07/14/19 0310 07/14/19 0310 07/15/19 0248 07/16/19 0343  HGB 14.1   < > 15.3* 13.6  HCT 40.4  --  45.5 41.2  PLT 230  --  273 240  LABPROT 42.8*  --  33.7* 28.7*  INR 4.5*  --  3.3* 2.7*  CREATININE 7.42*  --  5.23* 6.53*   < > = values in this interval not displayed.    Estimated Creatinine Clearance: 5.4 mL/min (A) (by C-G formula based on SCr of 6.53 mg/dL (H)).   Assessment:  Anticoag: Warfarin pta for afib (CHADS2VASc = 7); INR 3.3>2.7. CBC WNL. No bleeding.  - LD warfarin 0.5mg  on 1/16. - PTA warfarin dose: 3-6 mg (see note, unclear exactly which days).    Goal of Therapy:  INR 2-3 Monitor platelets by anticoagulation protocol: Yes   Plan:  Coumadin 4mg  po x 1 tonight. Daily INR    Tyee Vandevoorde S. Alford Highland, PharmD, BCPS Clinical Staff Pharmacist [email or web address] Alford Highland, [employer] 07/16/2019,9:38 AM

## 2019-07-16 NOTE — Care Management (Signed)
[facility] confirms bed is available for pt at [facility] as pt is now out of the isolation period.  [facility]/Palliative and Attending to all reach out to son/pt regarding bed and confirmation on decision to move forward with plan - son stated earlier he needed to discuss more in detail

## 2019-07-17 LAB — PROTIME-INR
INR: 2.8 — ABNORMAL HIGH (ref 0.8–1.2)
Prothrombin Time: 29 seconds — ABNORMAL HIGH (ref 11.4–15.2)

## 2019-07-17 LAB — RENAL FUNCTION PANEL
Albumin: 1.6 g/dL — ABNORMAL LOW (ref 3.5–5.0)
Anion gap: 16 — ABNORMAL HIGH (ref 5–15)
BUN: 50 mg/dL — ABNORMAL HIGH (ref 8–23)
CO2: 24 mmol/L (ref 22–32)
Calcium: 7.8 mg/dL — ABNORMAL LOW (ref 8.9–10.3)
Chloride: 95 mmol/L — ABNORMAL LOW (ref 98–111)
Creatinine, Ser: 7.35 mg/dL — ABNORMAL HIGH (ref 0.44–1.00)
GFR calc Af Amer: 5 mL/min — ABNORMAL LOW (ref >60)
GFR calc non Af Amer: 5 mL/min — ABNORMAL LOW (ref >60)
Glucose, Bld: 146 mg/dL — ABNORMAL HIGH (ref 70–99)
Phosphorus: 5.5 mg/dL — ABNORMAL HIGH (ref 2.5–4.6)
Potassium: 4.2 mmol/L (ref 3.5–5.1)
Sodium: 135 mmol/L (ref 135–145)

## 2019-07-17 LAB — GLUCOSE, CAPILLARY
Glucose-Capillary: 240 mg/dL — ABNORMAL HIGH (ref 70–99)
Glucose-Capillary: 184 mg/dL — ABNORMAL HIGH (ref 70–99)
Glucose-Capillary: 200 mg/dL — ABNORMAL HIGH (ref 70–99)
Glucose-Capillary: 142 mg/dL — ABNORMAL HIGH (ref 70–99)

## 2019-07-17 MED ORDER — AMLODIPINE BESYLATE 5 MG PO TABS: 5.0000 mg | ORAL_TABLET | Freq: Every day | ORAL | Status: DC

## 2019-07-17 MED ORDER — WARFARIN SODIUM 3 MG PO TABS
3.0000 mg | ORAL_TABLET | Freq: Once | ORAL | Status: AC
  Administered 2019-07-17: 3 mg via ORAL
  Filled 2019-07-17: qty 1

## 2019-07-17 NOTE — Progress Notes (Signed)
Nutrition Brief Note  Chart reviewed. Pt now discharging to [facility] where she will not receive HD. No further nutrition interventions warranted at this time. Please re-consult as needed.    Gaynell Face, MS, RD, LDN Inpatient Clinical Dietitian Pager: [phone] Weekend/After Hours: [phone]

## 2019-07-17 NOTE — Progress Notes (Signed)
PTAR called for patient to transfer to [facility]. Report called to Arbie Cookey, RN. Central line and IVs removed. Patient currently alert and oriented and aware of the plan to transfer to [facility].

## 2019-07-17 NOTE — Progress Notes (Signed)
ANTICOAGULATION CONSULT NOTE - Follow Up Consult  Pharmacy Consult for Coumadin Indication: atrial fibrillation  No Known Allergies  Patient Measurements: Height: 5\' 2"  (157.5 cm) Weight: 156 lb 1.4 oz (70.8 kg) IBW/kg (Calculated) : 50.1  Vital Signs: Temp: 97.8 F (36.6 C) (01/26 0505) Temp Source: Oral (01/26 0505) BP: 131/80 (01/26 0504) Pulse Rate: 98 (01/26 0504)  Labs: Recent Labs    07/15/19 0248 07/16/19 0343 07/17/19 0613 07/17/19 0614  HGB 15.3* 13.6  --   --   HCT 45.5 41.2  --   --   PLT 273 240  --   --   LABPROT 33.7* 28.7* 29.0*  --   INR 3.3* 2.7* 2.8*  --   CREATININE 5.23* 6.53*  --  7.35*    Estimated Creatinine Clearance: 4.9 mL/min (A) (by C-G formula based on SCr of 7.35 mg/dL (H)).   Assessment:  Anticoag: Warfarin pta for afib (CHADS2VASc = 7); INR 2.8. CBC WNL. No bleeding.  - LD warfarin 0.5mg  on 1/16. - PTA warfarin dose: 3-6 mg (see note, unclear exactly which days).    Goal of Therapy:  INR 2-3 Monitor platelets by anticoagulation protocol: Yes   Plan:  Coumadin 3mg  po x 1 tonight. Daily INR    Lynda Wanninger S. Alford Highland, PharmD, BCPS Clinical Staff Pharmacist [email or web address] Alford Highland, [place] 07/17/2019,9:04 AM

## 2019-07-17 NOTE — TOC Transition Note (Addendum)
Transition of Care [facility]) - CM/SW Discharge Note   Patient Details  Name: Patricia Randall MRN: [id number] Date of Birth: 1929-04-15  Transition of Care [facility]) CM/SW Contact:  Patricia Labrador, RN Phone Number: 07/17/2019, 1:22 PM   Clinical Narrative:    0900:  Pt now has discharge orders for residential hospice.  CM confirms that [facility] will have a bed for pt today.  Per Palliative and attending - pt/son is now in agreement for pt to transport to [facility] today.  Per [facility] pts grandchild will sign required paperwork as pts son also has COVID.    Update:  CM notified by both [facility] and CMA that pts son Patricia Randall will appeal discharge.  Appeal filed with Rosalyn Gess.  Attending made aware.  CM contacted pts son Patricia Randall with Patricia Randall RNCM witnessing conversation ; CM read aloud both the Detailed Notice of Discharge and the HINN 12  CM discussed case with Supervisor; pt could only go to SNF with hospice as pt will need HD however renal has determined pt can not tolerate.  Due to restrictions with both [facility] and Hospice coverage - pt/family will need to pay out of pocket for non covered expenses.  CM relayed to pts son Patricia Randall.  Patricia Randall called CM back and informed CM that he and his mom are interested in [facility] again. CM confirmed this with pt.   CM confirmed with [facility] that [facility] still has bed.   1410: Son informed CM that appeal has been cancelled and that he remains in agreement for pt to discharge to [facility] ([place] witness conversation).  However CMA confirmed with Rosalyn Gess that appeal has not yet been cancelled.  CMA to begin process of faxing required documentation to [place]  CM gave pts son resources verbally to assist with getting running water in the home  1500: CM discussed lack of cancellation for Appeal and verbal agreement to discharge to [facility] post appeal by son Patricia Randall with supervisor.  Per supervisor [facility] will move forward with pts discharging to  [facility] today per verbal agreement.  CM will print transport pkg to nurse station.  Bedside nurse to call report to [phone] and call PTAR when pt is ready.      Update 1521:  CM received confirmation that appeal has been cancelled    Final next level of care: [place] Barriers to Discharge: Barriers Resolved   Patient Goals and CMS Choice        Discharge Placement                       Discharge Plan and Services                            [place]: Hospice and [place]     Representative spoke with at [place]: Patricia Randall will be the liason for discharge  Social Determinants of Health (SDOH) Interventions     Readmission Risk Interventions No flowsheet data found.

## 2019-07-17 NOTE — Progress Notes (Signed)
[employer] with son [name] by phone multiple times this morning in attempt to complete paperwork for transfer to [facility] today. He provided phone number and email address for his daughter Garald Balding to complete paperwork. I talked through paperwork with [name] prior to him contacting Desiree to request she complete paperwork via [place]. When I spoke with her she confirmed speaking with her father and completed paperwork for transfer today. Spoke with [name] after paperwork was completed. At that time he requested to hold off while he explored appeal. He called me back and said he was agreeable to transfer today. He acknowledged paperwork is completed and Desiree will provide him with a copy. Spoke with [facility] manager Samantha to make aware room is available and paperwork is complete. TOC manager to confirm with [name] prior to sending patient over. [facility] is prepared to receive patient today.   Please send discharge summary to  [phone] prior to patient leaving the unit.  RN please call report to [facility] prior to patient leaving the unit.   Thank you,  Erling Conte, LCSW [phone]

## 2019-07-17 NOTE — Discharge Instructions (Signed)
Person Under Monitoring Name: Patricia Randall  Location: [address] [place] [address]   Infection Prevention Recommendations for Individuals Confirmed to have, or Being Evaluated for, 2019 Novel Coronavirus (COVID-19) Infection Who Receive Care at Home  Individuals who are confirmed to have, or are being evaluated for, COVID-19 should follow the prevention steps below until a healthcare provider or local or state health department says they can return to normal activities.  Stay home except to get medical care You should restrict activities outside your home, except for getting medical care. Do not go to work, school, or public areas, and do not use public transportation or taxis.  Call ahead before visiting your doctor Before your medical appointment, call the healthcare provider and tell them that you have, or are being evaluated for, COVID-19 infection. This will help the healthcare providers office take steps to keep other people from getting infected. Ask your healthcare provider to call the local or state health department.  Monitor your symptoms Seek prompt medical attention if your illness is worsening (e.g., difficulty breathing). Before going to your medical appointment, call the healthcare provider and tell them that you have, or are being evaluated for, COVID-19 infection. Ask your healthcare provider to call the local or state health department.  Wear a facemask You should wear a facemask that covers your nose and mouth when you are in the same room with other people and when you visit a healthcare provider. People who live with or visit you should also wear a facemask while they are in the same room with you.  Separate yourself from other people in your home As much as possible, you should stay in a different room from other people in your home. Also, you should use a separate bathroom, if available.  Avoid sharing household items You should not share dishes,  drinking glasses, cups, eating utensils, towels, bedding, or other items with other people in your home. After using these items, you should wash them thoroughly with soap and water.  Cover your coughs and sneezes Cover your mouth and nose with a tissue when you cough or sneeze, or you can cough or sneeze into your sleeve. Throw used tissues in a lined trash can, and immediately wash your hands with soap and water for at least 20 seconds or use an alcohol-based hand rub.  Wash your [facility] your hands often and thoroughly with soap and water for at least 20 seconds. You can use an alcohol-based hand sanitizer if soap and water are not available and if your hands are not visibly dirty. Avoid touching your eyes, nose, and mouth with unwashed hands.   Prevention Steps for Caregivers and Household Members of Individuals Confirmed to have, or Being Evaluated for, COVID-19 Infection Being Cared for in the Home  If you live with, or provide care at home for, a person confirmed to have, or being evaluated for, COVID-19 infection please follow these guidelines to prevent infection:  Follow healthcare providers instructions Make sure that you understand and can help the patient follow any healthcare provider instructions for all care.  Provide for the patients basic needs You should help the patient with basic needs in the home and provide support for getting groceries, prescriptions, and other personal needs.  Monitor the patients symptoms If they are getting sicker, call his or her medical provider and tell them that the patient has, or is being evaluated for, COVID-19 infection. This will help the healthcare providers office take  steps to keep other people from getting infected. Ask the healthcare provider to call the local or state health department.  Limit the number of people who have contact with the patient  If possible, have only one caregiver for the patient.  Other  household members should stay in another home or place of residence. If this is not possible, they should stay  in another room, or be separated from the patient as much as possible. Use a separate bathroom, if available.  Restrict visitors who do not have an essential need to be in the home.  Keep older adults, very young children, and other sick people away from the patient Keep older adults, very young children, and those who have compromised immune systems or chronic health conditions away from the patient. This includes people with chronic heart, lung, or kidney conditions, diabetes, and cancer.  Ensure good ventilation Make sure that shared spaces in the home have good air flow, such as from an air conditioner or an opened window, weather permitting.  Wash your hands often  Wash your hands often and thoroughly with soap and water for at least 20 seconds. You can use an alcohol based hand sanitizer if soap and water are not available and if your hands are not visibly dirty.  Avoid touching your eyes, nose, and mouth with unwashed hands.  Use disposable paper towels to dry your hands. If not available, use dedicated cloth towels and replace them when they become wet.  Wear a facemask and gloves  Wear a disposable facemask at all times in the room and gloves when you touch or have contact with the patients blood, body fluids, and/or secretions or excretions, such as sweat, saliva, sputum, nasal mucus, vomit, urine, or feces.  Ensure the mask fits over your nose and mouth tightly, and do not touch it during use.  Throw out disposable facemasks and gloves after using them. Do not reuse.  Wash your hands immediately after removing your facemask and gloves.  If your personal clothing becomes contaminated, carefully remove clothing and launder. Wash your hands after handling contaminated clothing.  Place all used disposable facemasks, gloves, and other waste in a lined container before  disposing them with other household waste.  Remove gloves and wash your hands immediately after handling these items.  Do not share dishes, glasses, or other household items with the patient  Avoid sharing household items. You should not share dishes, drinking glasses, cups, eating utensils, towels, bedding, or other items with a patient who is confirmed to have, or being evaluated for, COVID-19 infection.  After the person uses these items, you should wash them thoroughly with soap and water.  Wash laundry thoroughly  Immediately remove and wash clothes or bedding that have blood, body fluids, and/or secretions or excretions, such as sweat, saliva, sputum, nasal mucus, vomit, urine, or feces, on them.  Wear gloves when handling laundry from the patient.  Read and follow directions on labels of laundry or clothing items and detergent. In general, wash and dry with the warmest temperatures recommended on the label.  Clean all areas the individual has used often  Clean all touchable surfaces, such as counters, tabletops, doorknobs, bathroom fixtures, toilets, phones, keyboards, tablets, and bedside tables, every day. Also, clean any surfaces that may have blood, body fluids, and/or secretions or excretions on them.  Wear gloves when cleaning surfaces the patient has come in contact with.  Use a diluted bleach solution (e.g., dilute bleach with 1 part bleach  and 10 parts water) or a household disinfectant with a label that says EPA-registered for coronaviruses. To make a bleach solution at home, add 1 tablespoon of bleach to 1 quart (4 cups) of water. For a larger supply, add  cup of bleach to 1 gallon (16 cups) of water.  Read labels of cleaning products and follow recommendations provided on product labels. Labels contain instructions for safe and effective use of the cleaning product including precautions you should take when applying the product, such as wearing gloves or eye protection  and making sure you have good ventilation during use of the product.  Remove gloves and wash hands immediately after cleaning.  Monitor yourself for signs and symptoms of illness Caregivers and household members are considered close contacts, should monitor their health, and will be asked to limit movement outside of the home to the extent possible. Follow the monitoring steps for close contacts listed on the symptom monitoring form.   ? If you have additional questions, contact your local health department or call the epidemiologist on call at [phone] (available 24/7). ? This guidance is subject to change. For the most up-to-date guidance from [facility], please refer to their website: [email or web address]

## 2019-07-17 NOTE — Care Management Important Message (Signed)
Important Message  Patient Details  Name: Patricia Randall MRN: [id number] Date of Birth: Aug 23, 1928   Medicare Important Message Given:  Yes - Important Message mailed due to current National Emergency  Verbal consent obtained due to current National Emergency  Relationship to patient: Child Contact Name: Jasira Ostrom Call Date: 07/17/19  Time: 1204 Phone: [phone] Outcome: Spoke with contact Important Message mailed to: Patient address on file    Delorse Lek 07/17/2019, 12:05 PM

## 2019-07-17 NOTE — Discharge Summary (Signed)
Physician Discharge Summary  Patricia Randall [id number] DOB: [birth year] DOA: 07/04/2019  PCP: Leonard Downing, MD  Admit date: 07/04/2019 Discharge date: 07/17/2019  Admitted From: Home Disposition: Residential hospice  Recommendations for Outpatient Follow-up:  1. Follow up with PCP in 1-2 weeks 2. Please obtain BMP/CBC in one week your next doctors visit.   Discharge Condition: Stable CODE STATUS:  Diet recommendation: Comfort  Brief/Interim Summary: 86 year old female patient with PMH of atrial fibrillation on Warfarin, chronic systolic CHF, LVEF 36-64% by TTE 09/27/2017, hyperlipidemia, hypertension, type II DM, stage IV CKD (baseline creatinine ~1.8), presented to the ER due to increasing fatigue, weakness, poor appetite, decreased oral intake, decreased mobility and laying in the bed all the time for the last 3 days. Transiently in A. fib with RVR in the ED, improved after IV fluids. Admitted for acute on chronic kidney disease due to COVID-19 infection with associated poor oral intake and dehydration. Despite IV hydration, acute kidney injury continues to worsen, hyperkalemia, metabolic acidosis, suspect some uremia and volume overloaded. Patient/family open for short-term HD as per Nephrologist discussion with them on the weekend but patient not a long-term HD candidate.  HD initiated 07/10/2019.  Intermittently having a difficult time tolerating hemodialysis and also poor p.o. intake.  Palliative care team consulted to help establish goals of care.  He was determined to transition to hospice.  After having multiple prolonged discussions between transition of care team, palliative, hospice and myself it was finally determined to transition patient to residential hospice.  ESRD on hemodialysis, new diagnosis History of CKD stage V, progressively worsening Metabolic derangement -HD catheter placed by IR 07/09/2018.  For session dialysis 07/09/2018.  Having difficulty tolerating  dialysis. -Poor Longterm HD candidate.   COVID-19 infection; stable.  -Completed remdesivir 1/18.  Currently on room air.  Continue supportive care  Paroxysmal atrial fibrillation Intermittent pause, resolved -Digoxin discontinued.  Toprol-XL 50 mg daily -Coumadin, pharmacy to dose  Chronic congestive heart failure with reduced ejection fraction, 30% -Currently on dialysis.-Difficult time tolerating dialysis. No acute signs of Decompensation.   Essential hypertension -Norvasc 10 mg daily; Toprol-XL 50 mg daily.  Okay to hold prior to dialysis  Hyperlipidemia -Continue statin  Diabetes mellitus type 2 with renal complications -Currently uncontrolled due to being on steroids.  Continue insulin sliding scale and Accu-Chek.  Adjust as necessary  Goals of care discussion -Overall poor prognosis especially if she has difficult time tolerating dialysis.  Palliative care consulted- appreciate their input. Hospice care team consulted.    Discharge Diagnoses:  Principal Problem:   COVID-19 virus infection Active Problems:   Permanent atrial fibrillation (HCC)   Cardiomyopathy ([place])   Essential hypertension, benign   Type II diabetes mellitus ([place])   DM (diabetes mellitus), type 2 with renal complications ([place])   AKI (acute kidney injury) ([place])   ESRD (end stage renal disease) ([place])   Palliative care by specialist   DNR (do not resuscitate) discussion   DNR (do not resuscitate)   Goals of care, counseling/discussion   Muscular weakness   Failure to thrive in adult    Consultations:  Palliative   Nephro  Subjective: Patient tells me this morning that she thinks she is peeing and will recover, I assured her that in case if her renal function recovers on its own that she may do better but at this point there is not enough urine output for [place] to confidently confirm lack.  No other complaints otherwise  Discharge Exam: Vitals:   07/17/19 0505  07/17/19 1419  BP:   99/60  Pulse:  70  Resp:  (!) 21  Temp: 97.8 F (36.6 C) 97.7 F (36.5 C)  SpO2: 93% 95%   Vitals:   07/17/19 0504 07/17/19 0505 07/17/19 0648 07/17/19 1419  BP: 131/80   99/60  Pulse: 98   70  Resp: 18   (!) 21  Temp:  97.8 F (36.6 C)  97.7 F (36.5 C)  TempSrc:  Oral  Oral  SpO2: 92% 93%  95%  Weight:   70.8 kg   Height:        General: Pt is alert, awake, not in acute distress Cardiovascular: RRR, S1/S2 +, no rubs, no gallops Respiratory: CTA bilaterally, no wheezing, no rhonchi Abdominal: Soft, NT, ND, bowel sounds + Extremities: no edema, no cyanosis  Discharge Instructions  Discharge Instructions    MyChart COVID-19 home monitoring program   Complete by: Jul 17, 2019    Is the patient willing to use the MyChart Mobile App for home monitoring?: Yes   Temperature monitoring   Complete by: Jul 17, 2019    After how many days would you like to receive a notification of this patient's flowsheet entries?: 1     Allergies as of 07/17/2019   No Known Allergies     Medication List    TAKE these medications   Accu-Chek Aviva Plus test strip Generic drug: glucose blood TO CHECK BLOOD GLUCOSE ONCE DAILY **DX CODE: E11.9**   Accu-Chek Aviva Plus w/Device Kit TO CHECK BLOOD GLUCOSE ONCE DAILY **DX CODE: E11.9**   Accu-Chek Softclix Lancets lancets USE TO CHECK BLOOD GLUCOSE ONCE DAILY **DX CODE: E11.9**   amLODipine 5 MG tablet Commonly known as: NORVASC Take 1 tablet (5 mg total) by mouth daily.   cholecalciferol 1000 units tablet Commonly known as: VITAMIN D Take 1,000 Units by mouth daily.   digoxin 0.125 MG tablet Commonly known as: LANOXIN Take 0.0625 mg by mouth daily.   feeding supplement (GLUCERNA SHAKE) Liqd Take 237 mLs by mouth daily.   furosemide 40 MG tablet Commonly known as: LASIX Take 40 mg by mouth daily.   glipiZIDE 10 MG 24 hr tablet Commonly known as: GLUCOTROL XL Take 10 mg by mouth 2 (two) times daily with a meal.    latanoprost 0.005 % ophthalmic solution Commonly known as: XALATAN Place 1 drop into both eyes at bedtime.   lovastatin 40 MG tablet Commonly known as: MEVACOR Take 1 tablet (40 mg total) by mouth at bedtime.   metoprolol succinate 100 MG 24 hr tablet Commonly known as: TOPROL-XL Take 1 tablet (100 mg total) by mouth daily. Take with or immediately following a meal.   polyethylene glycol 17 g packet Commonly known as: MIRALAX / GLYCOLAX Take 17 g by mouth daily as needed for mild constipation.   warfarin 3 MG tablet Commonly known as: COUMADIN Take 3-6 mg by mouth as directed.       No Known Allergies  You were cared for by a hospitalist during your hospital stay. If you have any questions about your discharge medications or the care you received while you were in the hospital after you are discharged, you can call the unit and asked to speak with the hospitalist on call if the hospitalist that took care of you is not available. Once you are discharged, your primary care physician will handle any further medical issues. Please note that no refills for any discharge medications will be authorized once you are discharged,  as it is imperative that you return to your primary care physician (or establish a relationship with a primary care physician if you do not have one) for your aftercare needs so that they can reassess your need for medications and monitor your lab values.   Procedures/Studies: US Renal  Result Date: 07/06/2019 CLINICAL DATA:  Acute renal injury. EXAM: RENAL / URINARY TRACT ULTRASOUND COMPLETE COMPARISON:  No prior. FINDINGS: Right Kidney: Renal measurements: 9.8 x 4.5 x 3.9 cm = volume: 91.9 mL. Increased echogenicity. No mass or hydronephrosis visualized. Left Kidney: Renal measurements: 10.0 x 5.3 x 5.7 cm = volume: 158.7 mL. Increased echogenicity. No mass or hydronephrosis visualized. Bladder: Appears normal for degree of bladder distention. Other: None.  IMPRESSION: 1. Increased echogenicity both kidneys consistent with chronic medical renal disease. 2.  No acute abnormality.  No hydronephrosis or bladder distention. Electronically Signed   By: Marcello Moores  Register   On: 07/06/2019 07:58   IR Fluoro Guide CV Line Right  Result Date: 07/11/2019 CLINICAL DATA:  Renal failure. COVID positive. Needs access for hemodialysis EXAM: EXAM RIGHT IJ CATHETER PLACEMENT UNDER ULTRASOUND AND FLUOROSCOPIC GUIDANCE TECHNIQUE: The procedure, risks (including but not limited to bleeding, infection, organ damage, pneumothorax), benefits, and alternatives were explained to the patient. Questions regarding the procedure were encouraged and answered. The patient understands and consents to the procedure. Patency of the right IJ vein was confirmed with ultrasound with image documentation. An appropriate skin site was determined. Skin site was marked. Region was prepped using maximum barrier technique including cap and mask, sterile gown, sterile gloves, large sterile sheet, and Chlorhexidine as cutaneous antisepsis. The region was infiltrated locally with 1% lidocaine. Under real-time ultrasound guidance, the right IJ vein was accessed with a 21 gauge needle; the needle tip within the vein was confirmed with ultrasound image documentation. The needle exchanged over a 018 guidewire for vascular dilator which allowed advancement of a 16 cm Mahurkar catheter. This was positioned with the tip at the cavoatrial junction. Spot chest radiograph shows good positioning and no pneumothorax. Catheter was flushed and sutured externally with 0-Prolene sutures. Patient tolerated the procedure well. FLUOROSCOPY TIME:  0.2 minutes; 12 uGym2 DAP COMPLICATIONS: COMPLICATIONS none IMPRESSION: 1. Technically successful right IJ Mahurkar catheter placement. Electronically Signed   By: Lucrezia Europe M.D.   On: 07/11/2019 07:13   IR US Guide Vasc Access Right  Result Date: 07/11/2019 CLINICAL DATA:  Renal  failure. COVID positive. Needs access for hemodialysis EXAM: EXAM RIGHT IJ CATHETER PLACEMENT UNDER ULTRASOUND AND FLUOROSCOPIC GUIDANCE TECHNIQUE: The procedure, risks (including but not limited to bleeding, infection, organ damage, pneumothorax), benefits, and alternatives were explained to the patient. Questions regarding the procedure were encouraged and answered. The patient understands and consents to the procedure. Patency of the right IJ vein was confirmed with ultrasound with image documentation. An appropriate skin site was determined. Skin site was marked. Region was prepped using maximum barrier technique including cap and mask, sterile gown, sterile gloves, large sterile sheet, and Chlorhexidine as cutaneous antisepsis. The region was infiltrated locally with 1% lidocaine. Under real-time ultrasound guidance, the right IJ vein was accessed with a 21 gauge needle; the needle tip within the vein was confirmed with ultrasound image documentation. The needle exchanged over a 018 guidewire for vascular dilator which allowed advancement of a 16 cm Mahurkar catheter. This was positioned with the tip at the cavoatrial junction. Spot chest radiograph shows good positioning and no pneumothorax. Catheter was flushed and sutured externally with 0-Prolene  sutures. Patient tolerated the procedure well. FLUOROSCOPY TIME:  0.2 minutes; 12 uGym2 DAP COMPLICATIONS: COMPLICATIONS none IMPRESSION: 1. Technically successful right IJ Mahurkar catheter placement. Electronically Signed   By: Lucrezia Europe M.D.   On: 07/11/2019 07:13   DG Chest Portable 1 View  Result Date: 07/05/2019 CLINICAL DATA:  COVID positive and fatigue EXAM: PORTABLE CHEST 1 VIEW COMPARISON:  November 20, 2017 FINDINGS: The heart size and mediastinal contours are unchanged with mild cardiomegaly. Aortic knob calcifications. Both lungs are clear. The visualized skeletal structures are unremarkable. IMPRESSION: Mild cardiomegaly.  No acute cardiopulmonary  process. Electronically Signed   By: Prudencio Pair M.D.   On: 07/05/2019 02:11     The results of significant diagnostics from this hospitalization (including imaging, microbiology, ancillary and laboratory) are listed below for reference.     Microbiology: No results found for this or any previous visit (from the past 240 hour(s)).   Labs: BNP (last 3 results) No results for input(s): BNP in the last 8760 hours. Basic Metabolic Panel: Recent Labs  Lab 07/12/19 0352 07/12/19 0352 07/13/19 0359 07/14/19 0310 07/15/19 0248 07/16/19 0343 07/17/19 0614  NA 141   < > 137 136 138 136 135  K 4.3   < > 3.7 3.5 4.7 3.9 4.2  CL 100   < > 98 98 100 97* 95*  CO2 19*   < > '23 24 24 26 24  ' GLUCOSE 162*   < > 137* 174* 134* 155* 146*  BUN 84*   < > 53* 67* 36* 43* 50*  CREATININE 7.26*   < > 6.33* 7.42* 5.23* 6.53* 7.35*  CALCIUM 7.1*   < > 7.0* 7.1* 7.5* 7.4* 7.8*  MG 2.1  --  1.9 2.0 1.8 1.8  --   PHOS 9.2*   < > 6.9* 7.0* 4.5 4.8* 5.5*   < > = values in this interval not displayed.   Liver Function Tests: Recent Labs  Lab 07/13/19 0359 07/14/19 0310 07/15/19 0248 07/16/19 0343 07/17/19 0614  ALBUMIN 1.5* 1.6* 1.7* 1.5* 1.6*   No results for input(s): LIPASE, AMYLASE in the last 168 hours. No results for input(s): AMMONIA in the last 168 hours. CBC: Recent Labs  Lab 07/12/19 0352 07/13/19 0359 07/14/19 0310 07/15/19 0248 07/16/19 0343  WBC 5.8 5.0 5.5 7.1 7.2  HGB 14.8 14.1 14.1 15.3* 13.6  HCT 44.0 41.6 40.4 45.5 41.2  MCV 83.3 82.7 82.4 82.4 83.9  PLT 304 251 230 273 240   Cardiac Enzymes: No results for input(s): CKTOTAL, CKMB, CKMBINDEX, TROPONINI in the last 168 hours. BNP: Invalid input(s): POCBNP CBG: Recent Labs  Lab 07/16/19 1111 07/16/19 1639 07/16/19 2200 07/17/19 0810 07/17/19 1144  GLUCAP 246* 148* 142* 240* 184*   D-Dimer No results for input(s): DDIMER in the last 72 hours. Hgb A1c No results for input(s): HGBA1C in the last 72  hours. Lipid Profile No results for input(s): CHOL, HDL, LDLCALC, TRIG, CHOLHDL, LDLDIRECT in the last 72 hours. Thyroid function studies No results for input(s): TSH, T4TOTAL, T3FREE, THYROIDAB in the last 72 hours.  Invalid input(s): FREET3 Anemia work up No results for input(s): VITAMINB12, FOLATE, FERRITIN, TIBC, IRON, RETICCTPCT in the last 72 hours. Urinalysis    Component Value Date/Time   COLORURINE YELLOW 07/05/2019 2221   APPEARANCEUR HAZY (A) 07/05/2019 2221   LABSPEC 1.024 07/05/2019 2221   PHURINE 5.0 07/05/2019 2221   GLUCOSEU NEGATIVE 07/05/2019 2221   HGBUR SMALL (A) 07/05/2019 2221   BILIRUBINUR NEGATIVE  07/05/2019 [place] 07/05/2019 2221   PROTEINUR >=300 (A) 07/05/2019 2221   NITRITE NEGATIVE 07/05/2019 2221   LEUKOCYTESUR NEGATIVE 07/05/2019 2221   Sepsis Labs Invalid input(s): PROCALCITONIN,  WBC,  LACTICIDVEN Microbiology No results found for this or any previous visit (from the past 240 hour(s)).   Time coordinating discharge:  I have spent 35 minutes face to face with the patient and on the ward discussing the patients care, assessment, plan and disposition with other care givers. >50% of the time was devoted counseling the patient about the risks and benefits of treatment/Discharge disposition and coordinating care.   SIGNED:   Damita Lack, MD  [facility] 07/17/2019, 2:22 PM   If 7PM-7AM, please contact night-coverage

## 2019-07-18 NOTE — Progress Notes (Signed)
Patient discharged to [facility]. This nurse called facility to ensure patient could be brought in at this hour. Facility confirmed accepting the patient at this time. Patient alert and oriented and in no acute distress at the time of discharge. All patient belongings sent with the patient. Patient transported via [place].Marland Kitchen

## 2019-08-23 IMAGING — DX DG FOOT COMPLETE 3+V*R*
3 series · 3 of 3 positions shown · non-contrast
Comparison: None.

CLINICAL DATA: Cellulitis

EXAM:
RIGHT FOOT COMPLETE - 3+ VIEW

[foot ap]
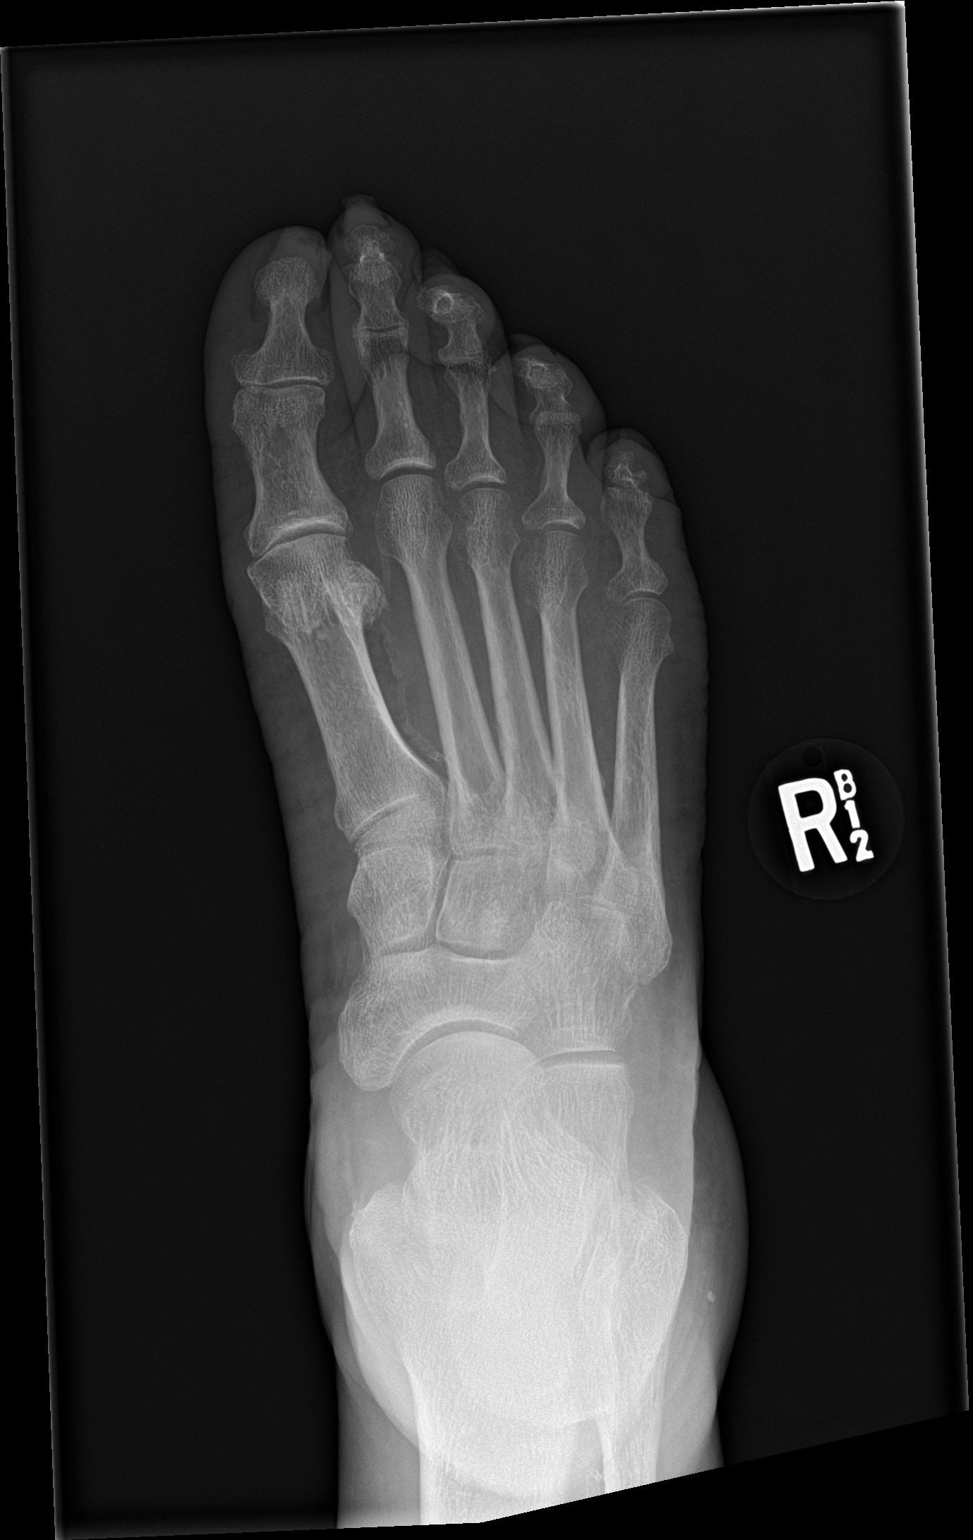

[foot obl]
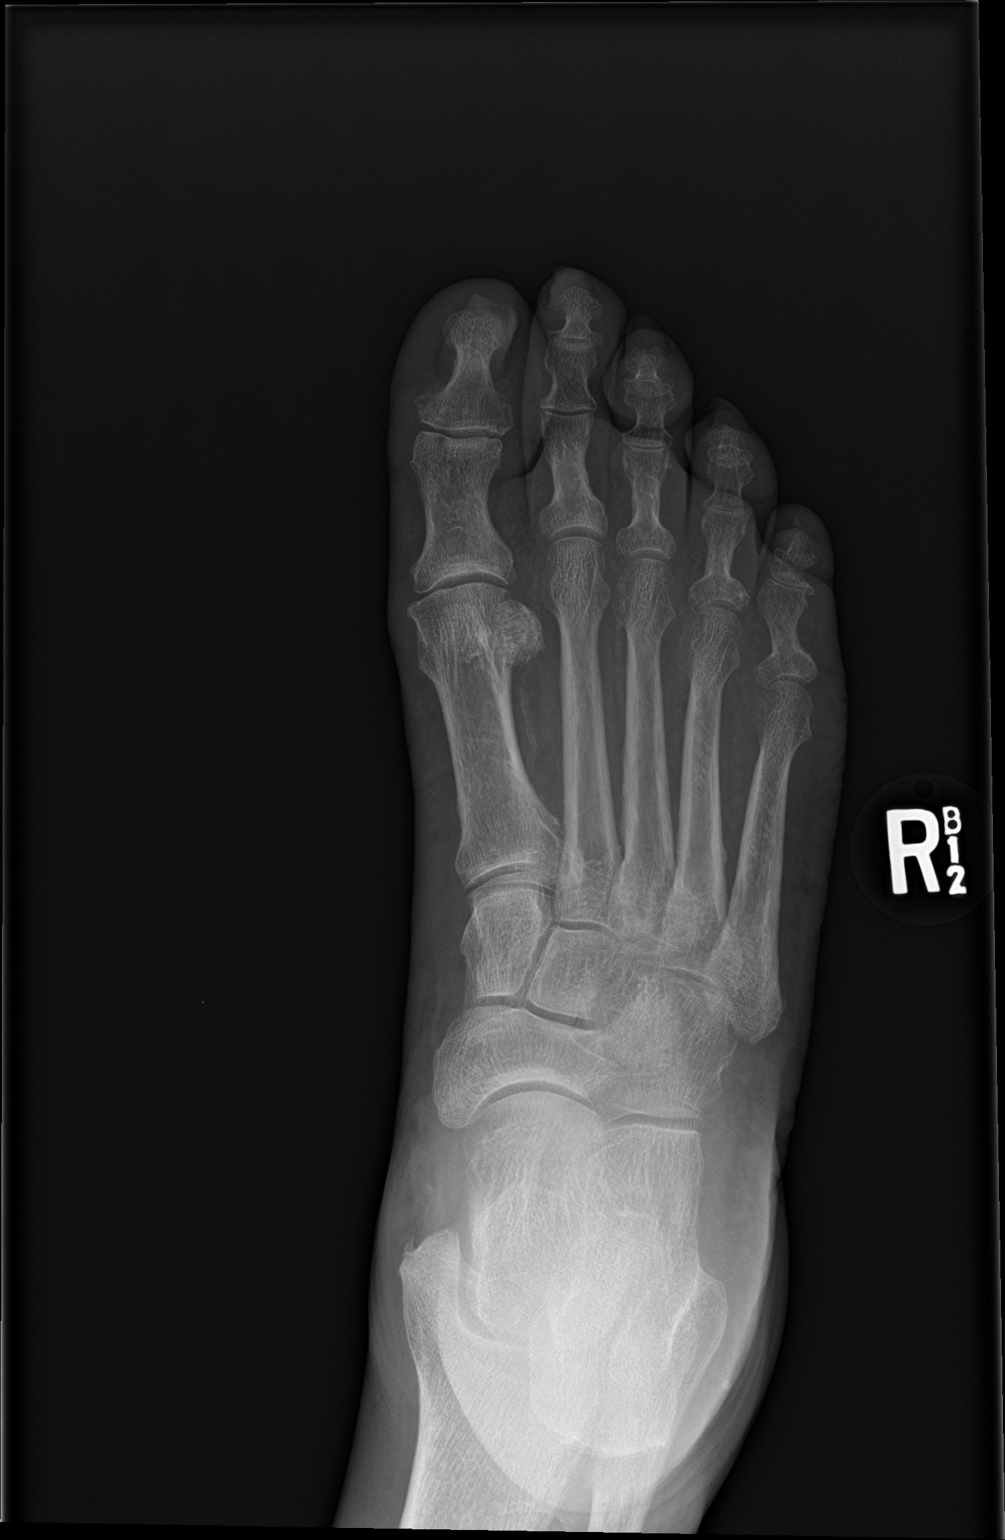

[foot lat]
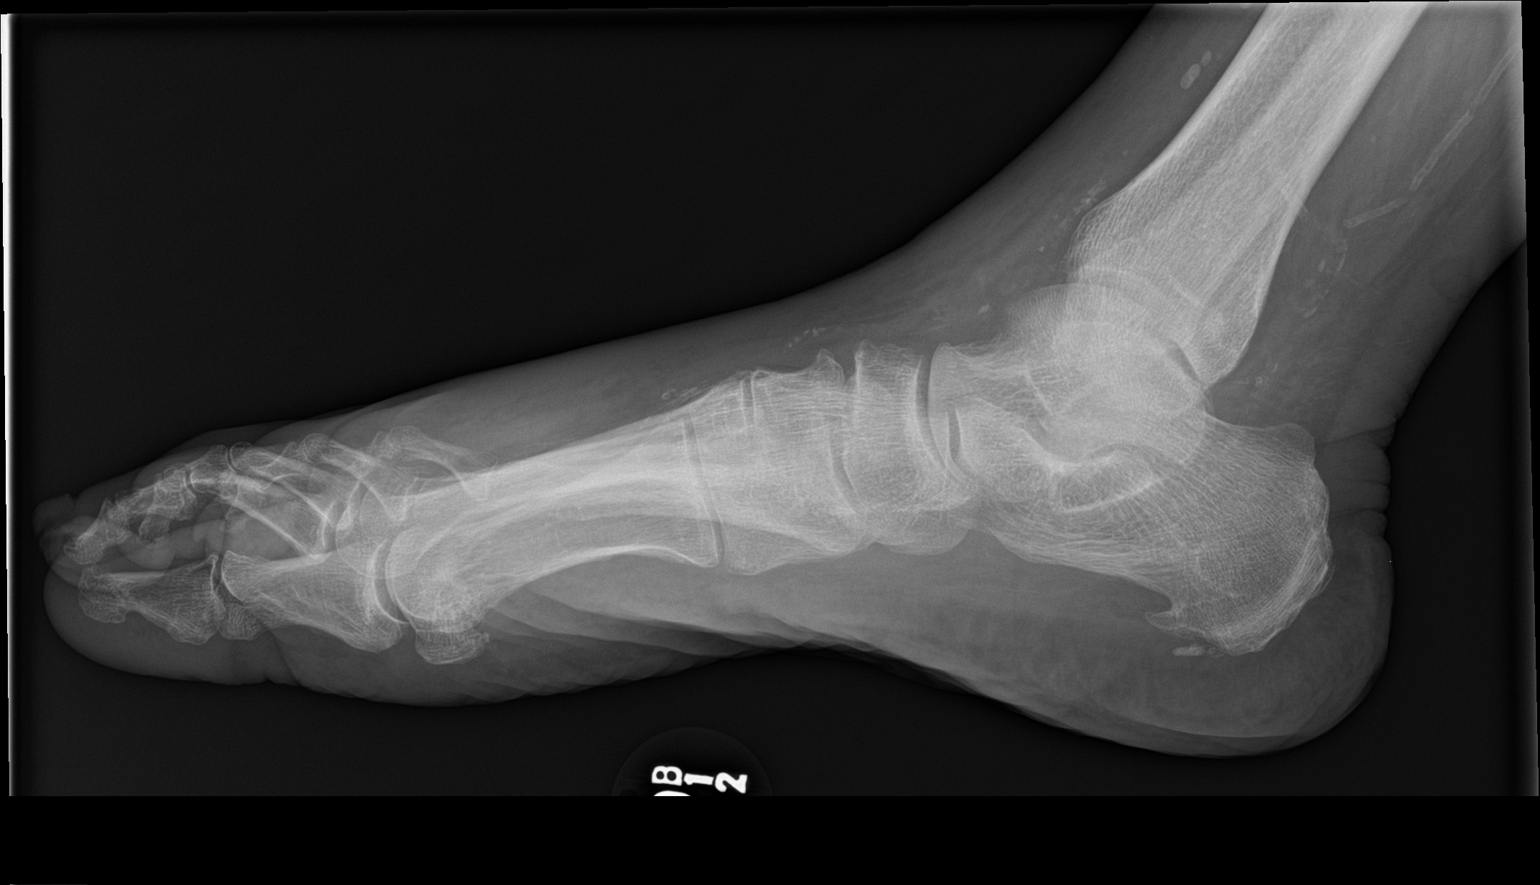

[3 of 3 positions shown; findings below may reference images not displayed]

FINDINGS: No fracture or malalignment. Degenerative changes at the first MTP
joint. Vascular calcifications. Moderate plantar calcaneal spur.
Soft tissue edema without soft tissue gas or radiopaque foreign
body.
IMPRESSION: No acute osseous abnormality

## 2019-08-28 ENCOUNTER — Non-Acute Institutional Stay: Payer: Medicare Other | Admitting: Hospice

## 2019-08-28 ENCOUNTER — Other Ambulatory Visit: Payer: Self-pay

## 2019-08-28 DIAGNOSIS — I4821 Permanent atrial fibrillation: Secondary | ICD-10-CM

## 2019-08-28 DIAGNOSIS — Z515 Encounter for palliative care: Secondary | ICD-10-CM

## 2019-08-28 NOTE — Progress Notes (Addendum)
[facility] Consult Note Telephone: [phone]  Fax: [phone]  PATIENT NAME: Patricia Randall DOB: 08/21/1928 MRN: [id number]  PRIMARY CARE PROVIDER:   Leonard Downing, MD Referring Provider: Dr. Jules Husbands Responsible Party: Son - Derrill Memo RECOMMENDATIONS/PLAN:   Advance Care Planning/Goals of Care:Visit consisted of building trust and discussions on Palliative Medicine as specialized medical care for people living with serious illness, aimed at facilitating better quality of life through symptoms relief, assisting with advance care plan and establishing goals of care.  Patricia Randall and another relation were present during visit in the recreation room; he consented to palliative care services.  Patricia Randall affirmed patient is a DNR. After extensive discussions on goals of care, MOST form was signed today; his selections include Do not resuscitate, limited additional interventions, antibiotics/IV fluids as indicated and Feeding tube for a defined trial period. DNR and MOST forms in facility chart and uploaded in [place]. Goals of care include to maximize quality of life and symptom management. Symptom management: Patient denied palpitations, no bleeding. She continues on Coumadin for Afib as ordered. PT is ongoing for bed mobility and transfer training. Per nursing, patient sometimes complains of low back pain and this is effectively managed with Tylenol and Biofreeze. No edema to BLE; she denied pain/discomfort/SOB. Nursing with no concerns. Encouraged ongoing care.  Follow up: Palliative care will continue to follow patient for goals of care clarification and symptom management. I spent 76  minutes providing this consultation; time includes chart review and documentation. More than 50% of the time in this consultation was spent on coordinating communication  HISTORY OF PRESENT ILLNESS:  Patricia Randall is a 86 y.o. year old female with multiple medical  problems including Afib, CHF, stage IV CKD. Palliative Care was asked to help address goals of care.   CODE STATUS: DNR  PPS: 40% can self propel wheelchair sometimes HOSPICE ELIGIBILITY/DIAGNOSIS: TBD  PAST MEDICAL HISTORY:  Past Medical History:  Diagnosis Date  . Atrial fibrillation ([place])    a. 03/2013 s/p TEE/DCCV;  b. 03/2013 Eliquis initiated.  . Chronic systolic CHF (congestive heart failure) ([place])    a. 03/2013 Echo: EF 20-25%, diff HK, mild to mod MR, sev dil LA, mild RV dysfxn, sev dil RA, mod TR.  . Claudication ([place])   . Glaucoma   . High cholesterol   . Hypertension   . Type II diabetes mellitus ([place])     SOCIAL HX:  Social History   Tobacco Use  . Smoking status: Never Smoker  . Smokeless tobacco: Never Used  Substance Use Topics  . Alcohol use: No    ALLERGIES: No Known Allergies   PERTINENT MEDICATIONS:  Outpatient Encounter Medications as of 08/28/2019  Medication Sig  . ACCU-CHEK AVIVA PLUS test strip TO CHECK BLOOD GLUCOSE ONCE DAILY **DX CODE: E11.9**  . ACCU-CHEK SOFTCLIX LANCETS lancets USE TO CHECK BLOOD GLUCOSE ONCE DAILY **DX CODE: E11.9**  . amLODipine (NORVASC) 5 MG tablet Take 1 tablet (5 mg total) by mouth daily.  . Blood Glucose Monitoring Suppl (ACCU-CHEK AVIVA PLUS) w/Device KIT TO CHECK BLOOD GLUCOSE ONCE DAILY **DX CODE: E11.9**  . cholecalciferol (VITAMIN D) 1000 units tablet Take 1,000 Units by mouth daily.  . digoxin (LANOXIN) 0.125 MG tablet Take 0.0625 mg by mouth daily.   . feeding supplement, GLUCERNA SHAKE, (GLUCERNA SHAKE) LIQD Take 237 mLs by mouth daily.  . furosemide (LASIX) 40 MG tablet Take 40 mg by mouth daily.  Marland Kitchen glipiZIDE (GLUCOTROL XL)  10 MG 24 hr tablet Take 10 mg by mouth 2 (two) times daily with a meal.   . latanoprost (XALATAN) 0.005 % ophthalmic solution Place 1 drop into both eyes at bedtime.  . lovastatin (MEVACOR) 40 MG tablet Take 1 tablet (40 mg total) by mouth at bedtime.  . metoprolol succinate (TOPROL-XL) 100  MG 24 hr tablet Take 1 tablet (100 mg total) by mouth daily. Take with or immediately following a meal.  . polyethylene glycol (MIRALAX / GLYCOLAX) packet Take 17 g by mouth daily as needed for mild constipation.  Marland Kitchen warfarin (COUMADIN) 3 MG tablet Take 3-6 mg by mouth as directed.    No facility-administered encounter medications on file as of 08/28/2019.    PHYSICAL EXAM/ROS:   General: NAD Cardiovascular: regular rate and rhythm; denies chest pain Pulmonary: clear ant/post fields, no SOB, no coughing. Normal respiratory effort Abdomen: soft, nontender, + bowel sounds GU: no suprapubic tenderness Extremities: no edema to BLE Skin: no rashes to exposed skin Neurological: Weakness but otherwise nonfocal  Teodoro Spray, NP

## 2019-08-31 ENCOUNTER — Encounter (HOSPITAL_COMMUNITY): Payer: Self-pay | Admitting: Emergency Medicine

## 2019-08-31 ENCOUNTER — Inpatient Hospital Stay (HOSPITAL_COMMUNITY)
Admission: EM | Admit: 2019-08-31 | Discharge: 2019-09-04 | DRG: 682 | Disposition: A | Discharge status: Hospice | Payer: Medicare Other | Source: Skilled Nursing Facility | Attending: Internal Medicine | Admitting: Internal Medicine

## 2019-08-31 ENCOUNTER — Other Ambulatory Visit: Payer: Self-pay

## 2019-08-31 ENCOUNTER — Emergency Department (HOSPITAL_COMMUNITY): Payer: Medicare Other

## 2019-08-31 DIAGNOSIS — E878 Other disorders of electrolyte and fluid balance, not elsewhere classified: Secondary | ICD-10-CM | POA: Diagnosis present

## 2019-08-31 DIAGNOSIS — I132 Hypertensive heart and chronic kidney disease with heart failure and with stage 5 chronic kidney disease, or end stage renal disease: Secondary | ICD-10-CM | POA: Diagnosis present

## 2019-08-31 DIAGNOSIS — I4821 Permanent atrial fibrillation: Secondary | ICD-10-CM | POA: Diagnosis present

## 2019-08-31 DIAGNOSIS — D689 Coagulation defect, unspecified: Secondary | ICD-10-CM | POA: Diagnosis present

## 2019-08-31 DIAGNOSIS — Z79891 Long term (current) use of opiate analgesic: Secondary | ICD-10-CM | POA: Diagnosis not present

## 2019-08-31 DIAGNOSIS — J9621 Acute and chronic respiratory failure with hypoxia: Secondary | ICD-10-CM | POA: Diagnosis not present

## 2019-08-31 DIAGNOSIS — Z66 Do not resuscitate: Secondary | ICD-10-CM | POA: Diagnosis present

## 2019-08-31 DIAGNOSIS — H409 Unspecified glaucoma: Secondary | ICD-10-CM | POA: Diagnosis present

## 2019-08-31 DIAGNOSIS — I10 Essential (primary) hypertension: Secondary | ICD-10-CM | POA: Diagnosis present

## 2019-08-31 DIAGNOSIS — I5022 Chronic systolic (congestive) heart failure: Secondary | ICD-10-CM | POA: Diagnosis present

## 2019-08-31 DIAGNOSIS — Z833 Family history of diabetes mellitus: Secondary | ICD-10-CM

## 2019-08-31 DIAGNOSIS — E875 Hyperkalemia: Secondary | ICD-10-CM | POA: Diagnosis present

## 2019-08-31 DIAGNOSIS — Z79899 Other long term (current) drug therapy: Secondary | ICD-10-CM

## 2019-08-31 DIAGNOSIS — L89312 Pressure ulcer of right buttock, stage 2: Secondary | ICD-10-CM | POA: Diagnosis present

## 2019-08-31 DIAGNOSIS — Z515 Encounter for palliative care: Secondary | ICD-10-CM | POA: Diagnosis not present

## 2019-08-31 DIAGNOSIS — E860 Dehydration: Secondary | ICD-10-CM | POA: Diagnosis present

## 2019-08-31 DIAGNOSIS — N189 Chronic kidney disease, unspecified: Secondary | ICD-10-CM | POA: Diagnosis present

## 2019-08-31 DIAGNOSIS — L8990 Pressure ulcer of unspecified site, unspecified stage: Secondary | ICD-10-CM | POA: Insufficient documentation

## 2019-08-31 DIAGNOSIS — N184 Chronic kidney disease, stage 4 (severe): Secondary | ICD-10-CM | POA: Diagnosis not present

## 2019-08-31 DIAGNOSIS — E7800 Pure hypercholesterolemia, unspecified: Secondary | ICD-10-CM | POA: Diagnosis present

## 2019-08-31 DIAGNOSIS — N185 Chronic kidney disease, stage 5: Secondary | ICD-10-CM | POA: Diagnosis not present

## 2019-08-31 DIAGNOSIS — R627 Adult failure to thrive: Secondary | ICD-10-CM | POA: Diagnosis present

## 2019-08-31 DIAGNOSIS — Z7189 Other specified counseling: Secondary | ICD-10-CM

## 2019-08-31 DIAGNOSIS — N186 End stage renal disease: Secondary | ICD-10-CM | POA: Diagnosis present

## 2019-08-31 DIAGNOSIS — N179 Acute kidney failure, unspecified: Secondary | ICD-10-CM | POA: Diagnosis present

## 2019-08-31 DIAGNOSIS — E1129 Type 2 diabetes mellitus with other diabetic kidney complication: Secondary | ICD-10-CM | POA: Diagnosis present

## 2019-08-31 DIAGNOSIS — N171 Acute kidney failure with acute cortical necrosis: Secondary | ICD-10-CM | POA: Diagnosis not present

## 2019-08-31 DIAGNOSIS — Z8616 Personal history of COVID-19: Secondary | ICD-10-CM | POA: Diagnosis not present

## 2019-08-31 DIAGNOSIS — E119 Type 2 diabetes mellitus without complications: Secondary | ICD-10-CM

## 2019-08-31 DIAGNOSIS — E870 Hyperosmolality and hypernatremia: Secondary | ICD-10-CM | POA: Diagnosis present

## 2019-08-31 DIAGNOSIS — E1122 Type 2 diabetes mellitus with diabetic chronic kidney disease: Secondary | ICD-10-CM | POA: Diagnosis present

## 2019-08-31 DIAGNOSIS — D631 Anemia in chronic kidney disease: Secondary | ICD-10-CM | POA: Diagnosis present

## 2019-08-31 DIAGNOSIS — Z7901 Long term (current) use of anticoagulants: Secondary | ICD-10-CM | POA: Diagnosis not present

## 2019-08-31 DIAGNOSIS — Z6828 Body mass index (BMI) 28.0-28.9, adult: Secondary | ICD-10-CM

## 2019-08-31 DIAGNOSIS — N289 Disorder of kidney and ureter, unspecified: Secondary | ICD-10-CM

## 2019-08-31 LAB — CBC
HCT: 32.1 % — ABNORMAL LOW (ref 36.0–46.0)
Hemoglobin: 10.7 g/dL — ABNORMAL LOW (ref 12.0–15.0)
MCH: 29.2 pg (ref 26.0–34.0)
MCHC: 33.3 g/dL (ref 30.0–36.0)
MCV: 87.7 fL (ref 80.0–100.0)
Platelets: 150 10*3/uL (ref 150–400)
RBC: 3.66 MIL/uL — ABNORMAL LOW (ref 3.87–5.11)
RDW: 20.4 % — ABNORMAL HIGH (ref 11.5–15.5)
WBC: 8.8 10*3/uL (ref 4.0–10.5)
nRBC: 2.2 % — ABNORMAL HIGH (ref 0.0–0.2)

## 2019-08-31 LAB — COMPREHENSIVE METABOLIC PANEL
ALT: 35 U/L (ref 0–44)
AST: 34 U/L (ref 15–41)
Albumin: 2.6 g/dL — ABNORMAL LOW (ref 3.5–5.0)
Alkaline Phosphatase: 97 U/L (ref 38–126)
Anion gap: 16 — ABNORMAL HIGH (ref 5–15)
BUN: 68 mg/dL — ABNORMAL HIGH (ref 8–23)
CO2: 18 mmol/L — ABNORMAL LOW (ref 22–32)
Calcium: 9 mg/dL (ref 8.9–10.3)
Chloride: 112 mmol/L — ABNORMAL HIGH (ref 98–111)
Creatinine, Ser: 4.16 mg/dL — ABNORMAL HIGH (ref 0.44–1.00)
GFR calc Af Amer: 10 mL/min — ABNORMAL LOW (ref >60)
GFR calc non Af Amer: 9 mL/min — ABNORMAL LOW (ref >60)
Glucose, Bld: 149 mg/dL — ABNORMAL HIGH (ref 70–99)
Potassium: 5.2 mmol/L — ABNORMAL HIGH (ref 3.5–5.1)
Sodium: 146 mmol/L — ABNORMAL HIGH (ref 135–145)
Total Bilirubin: 1.1 mg/dL (ref 0.3–1.2)
Total Protein: 6.5 g/dL (ref 6.5–8.1)

## 2019-08-31 LAB — PROTIME-INR
INR: 8.4 (ref 0.8–1.2)
Prothrombin Time: 70.2 seconds — ABNORMAL HIGH (ref 11.4–15.2)

## 2019-08-31 LAB — CBG MONITORING, ED: Glucose-Capillary: 128 mg/dL — ABNORMAL HIGH (ref 70–99)

## 2019-08-31 MED ORDER — SODIUM CHLORIDE 0.9 % IV SOLN: INTRAVENOUS | Status: DC

## 2019-08-31 NOTE — ED Notes (Signed)
Date and time results received: 08/31/19 1130p  Test: INR Critical Value: 8.4  Name of Provider Notified: Eulis Foster, MD  Orders Received? Or Actions Taken?: Orders Received - See Orders for details

## 2019-08-31 NOTE — ED Triage Notes (Signed)
Pt arrived from nursing facility. Pt was on hospice care, but "took too long", so she was moved to a skilled nursing facility per EMS. Pt is a DNR. EMS reports that pt has had AMS for the past 3 days, and that her potassium level was 6.2 and her BUN was 61. Pt had a fib on the ride to the hospital.

## 2019-08-31 NOTE — H&P (Signed)
History and Physical   [name] LFY:[id number] DOB: [birth year] DOA: 08/31/2019  Referring MD/NP/PA: Dr. Eulis Foster  PCP: Leonard Downing, MD   Outpatient Specialists: None  Patient coming from: Skilled nursing facility  Chief Complaint: Generalized weakness  HPI: Patricia Randall is a 86 y.o. female with medical history significant of end-stage renal disease not currently on hemodialysis due to intolerance, atrial fibrillation, diabetes, hypertension, who was on hospice care residential after admission to the hospital in January was COVID-19.  Patient apparently got better from hospice and was discharged to skilled facility.  She was deemed not actively dying.  She has been in the facility with worsening renal function since her admission in January.  Patient brought in today due to weakness lethargy and also worsening renal function.  She has got severe systolic CHF with EF of 30 to 35%.  Patient has been on Lasix.  She has poor oral intake at the moment.  Also appears to have failed to thrive.  She was brought into the ER for further evaluation and treatment..  ED Course: Temperature 97.7 blood pressure 129/111 respiratory of 30 pulse 110 and sats 98% on room air.  White count 8.8 hemoglobin 10.7 and platelet count 150.  Sodium 146 potassium 5.2 chloride 112 CO2 of 18 BUN 2068 and creatinine 4.16.  Calcium 9.0.  INR is 8.4 and glucose 149.  Chest x-ray shows stable cardiomegaly with possible small bilateral pleural effusions.  No consolidation  Review of Systems: As per HPI otherwise 10 point review of systems negative.    Past Medical History:  Diagnosis Date  . Atrial fibrillation ([place])    a. 03/2013 s/p TEE/DCCV;  b. 03/2013 Eliquis initiated.  . Chronic systolic CHF (congestive heart failure) ([place])    a. 03/2013 Echo: EF 20-25%, diff HK, mild to mod MR, sev dil LA, mild RV dysfxn, sev dil RA, mod TR.  . Claudication ([place])   . Glaucoma   . High cholesterol   . Hypertension   . Type II  diabetes mellitus ([place])     Past Surgical History:  Procedure Laterality Date  . CARDIOVERSION N/A 04/02/2013   Procedure: CARDIOVERSION;  Surgeon: Thayer Headings, MD;  Location: [facility];  Service: Cardiovascular;  Laterality: N/A;  Spoke with Tom   . ESOPHAGOGASTRODUODENOSCOPY N/A 05/26/2016   Procedure: ESOPHAGOGASTRODUODENOSCOPY (EGD);  Surgeon: Ladene Artist, MD;  Location[facility] ENDOSCOPY;  Service: Endoscopy;  Laterality: N/A;  . EYE SURGERY Right    "had blood in it"   . IR FLUORO GUIDE CV LINE RIGHT  07/10/2019  . IR US GUIDE VASC ACCESS RIGHT  07/10/2019  . TEE WITHOUT CARDIOVERSION N/A 04/02/2013   Procedure: TRANSESOPHAGEAL ECHOCARDIOGRAM (TEE);  Surgeon: Thayer Headings, MD;  Location: [facility];  Service: Cardiovascular;  Laterality: N/A;     reports that she has never smoked. She has never used smokeless tobacco. She reports that she does not drink alcohol or use drugs.  No Known Allergies  Family History  Problem Relation Age of Onset  . Other Mother        died in her 53's - 'just got sick.'  . Other Father        died @ 68, unknown cause.  . Diabetes Brother   . Other Other        12 siblings + 7 more 1/2 siblings.     Prior to Admission medications   Medication Sig Start Date End Date Taking? Authorizing Provider  acetaminophen (TYLENOL) 500  MG tablet Take 500 mg by mouth every 6 (six) hours as needed for mild pain.   Yes [provider]  feeding supplement, GLUCERNA SHAKE, (GLUCERNA SHAKE) LIQD Take 237 mLs by mouth daily.   Yes [provider]  hydrOXYzine (ATARAX/VISTARIL) 10 MG tablet Take 10 mg by mouth in the morning and at bedtime. x3days started 3.11.21   Yes [provider]  LORazepam (ATIVAN) 1 MG tablet Take 1 mg by mouth every 4 (four) hours as needed for anxiety.   Yes [provider]  Menthol, Topical Analgesic, (BIOFREEZE EX) Apply 1 application topically See admin instructions. apply to entire back qam    Yes [provider]  metoprolol succinate (TOPROL-XL) 100 MG 24 hr tablet Take 1 tablet (100 mg total) by mouth daily. Take with or immediately following a meal. 11/25/17  Yes Rai, Ripudeep K, MD  ondansetron (ZOFRAN) 4 MG tablet Take 4 mg by mouth every 8 (eight) hours as needed for nausea or vomiting.   Yes [provider]  oxyCODONE (ROXICODONE INTENSOL) 20 MG/ML concentrated solution Take 5 mg by mouth every 4 (four) hours as needed for severe pain.   Yes [provider]  polyethylene glycol (MIRALAX / GLYCOLAX) packet Take 17 g by mouth daily as needed for mild constipation. 09/29/17  Yes Lavina Hamman, MD  sennosides-docusate sodium (SENOKOT-S) 8.6-50 MG tablet Take 2 tablets by mouth daily as needed for constipation.   Yes [provider]  warfarin (COUMADIN) 4 MG tablet Take 4 mg by mouth daily.    Yes [provider]  ACCU-CHEK AVIVA PLUS test strip TO CHECK BLOOD GLUCOSE ONCE DAILY **DX CODE: E11.9** 01/30/18   [provider]  ACCU-CHEK SOFTCLIX LANCETS lancets USE TO CHECK BLOOD GLUCOSE ONCE DAILY **DX CODE: E11.9** 01/30/18   [provider]  amLODipine (NORVASC) 5 MG tablet Take 1 tablet (5 mg total) by mouth daily. Patient not taking: Reported on 08/31/2019 07/17/19   Damita Lack, MD  Blood Glucose Monitoring Suppl ([place]) w/Device KIT TO CHECK BLOOD GLUCOSE ONCE DAILY **DX CODE: E11.9** 01/30/18   [provider]  furosemide (LASIX) 10 MG/ML solution Take 10 mg by mouth once. Took 3.12.21    [provider]  furosemide (LASIX) 20 MG tablet Take 20 mg by mouth daily. x2 days start 3.13.21    [provider]  lovastatin (MEVACOR) 40 MG tablet Take 1 tablet (40 mg total) by mouth at bedtime. Patient not taking: Reported on 08/31/2019 11/15/17   Roxan Hockey, MD    Physical Exam: Vitals:   08/31/19 2130 08/31/19 2200 08/31/19 2230 08/31/19 2300  BP: 128/87 (!) 126/92 128/76  122/72  Pulse:   (!) 104 97  Resp: 19 (!) 21 (!) 30 17  Temp:      SpO2:   100% 100%  Weight:      Height:          Constitutional: Chronically ill looking, very drowsy, Vitals:   08/31/19 2130 08/31/19 2200 08/31/19 2230 08/31/19 2300  BP: 128/87 (!) 126/92 128/76 122/72  Pulse:   (!) 104 97  Resp: 19 (!) 21 (!) 30 17  Temp:      SpO2:   100% 100%  Weight:      Height:       Eyes: PERRL, lids and conjunctivae normal ENMT: Mucous membranes are dry. Posterior pharynx clear of any exudate or lesions.Normal dentition.  Neck: normal, supple, no masses, no thyromegaly Respiratory: clear  to auscultation bilaterally, no wheezing, no crackles. Normal respiratory effort. No accessory muscle use.  Cardiovascular: Regular rate and rhythm, systolic murmur/ rubs / gallops. 1+ extremity edema. 2+ pedal pulses. No carotid bruits.  Abdomen: no tenderness, no masses palpated. No hepatosplenomegaly. Bowel sounds positive.  Musculoskeletal: no clubbing / cyanosis. No joint deformity upper and lower extremities. Good ROM, no contractures. Normal muscle tone.  Skin: no rashes, lesions, ulcers. No induration Neurologic: CN 2-12 grossly intact. Sensation intact, DTR normal. Strength 5/5 in all 4.  Psychiatric: Normal judgment and insight. Alert and oriented x 3. Normal mood.     Labs on Admission: I have personally reviewed following labs and imaging studies  CBC: Recent Labs  Lab 08/31/19 2136  WBC 8.8  HGB 10.7*  HCT 32.1*  MCV 87.7  PLT 361   Basic Metabolic Panel: Recent Labs  Lab 08/31/19 2136  NA 146*  K 5.2*  CL 112*  CO2 18*  GLUCOSE 149*  BUN 68*  CREATININE 4.16*  CALCIUM 9.0   GFR: Estimated Creatinine Clearance: 8.6 mL/min (A) (by C-G formula based on SCr of 4.16 mg/dL (H)). Liver Function Tests: Recent Labs  Lab 08/31/19 2136  AST 34  ALT 35  ALKPHOS 97  BILITOT 1.1  PROT 6.5  ALBUMIN 2.6*   No results for input(s): LIPASE, AMYLASE in the last 168 hours.  No results for input(s): AMMONIA in the last 168 hours. Coagulation Profile: Recent Labs  Lab 08/31/19 2213  INR 8.4*   Cardiac Enzymes: No results for input(s): CKTOTAL, CKMB, CKMBINDEX, TROPONINI in the last 168 hours. BNP (last 3 results) No results for input(s): PROBNP in the last 8760 hours. HbA1C: No results for input(s): HGBA1C in the last 72 hours. CBG: Recent Labs  Lab 08/31/19 2108  GLUCAP 128*   Lipid Profile: No results for input(s): CHOL, HDL, LDLCALC, TRIG, CHOLHDL, LDLDIRECT in the last 72 hours. Thyroid Function Tests: No results for input(s): TSH, T4TOTAL, FREET4, T3FREE, THYROIDAB in the last 72 hours. Anemia Panel: No results for input(s): VITAMINB12, FOLATE, FERRITIN, TIBC, IRON, RETICCTPCT in the last 72 hours. Urine analysis:    Component Value Date/Time   COLORURINE YELLOW 07/05/2019 2221   APPEARANCEUR HAZY (A) 07/05/2019 2221   LABSPEC 1.024 07/05/2019 2221   PHURINE 5.0 07/05/2019 2221   GLUCOSEU NEGATIVE 07/05/2019 2221   HGBUR SMALL (A) 07/05/2019 2221   [facility] 07/05/2019 [place] 07/05/2019 2221   PROTEINUR >=300 (A) 07/05/2019 2221   NITRITE NEGATIVE 07/05/2019 2221   LEUKOCYTESUR NEGATIVE 07/05/2019 2221   Sepsis Labs: _0 (procalcitonin:4,lacticidven:4) )No results found for this or any previous visit (from the past 240 hour(s)).   Radiological Exams on Admission: DG Chest Port 1 View  Result Date: 08/31/2019 CLINICAL DATA:  86 year old female with weakness. EXAM: PORTABLE CHEST 1 VIEW COMPARISON:  Chest radiograph dated 07/05/2019. FINDINGS: There is stable cardiomegaly. Bibasilar hazy densities may represent atelectasis or small pleural effusions. No vascular congestion or edema. No focal consolidation or pneumothorax. Atherosclerotic calcification of the aorta. No acute osseous pathology. IMPRESSION: Stable cardiomegaly with possible small bilateral pleural effusions. No focal consolidation.  Electronically Signed   By: Anner Crete M.D.   On: 08/31/2019 21:51      Assessment/Plan Principal Problem:   Acute on chronic renal failure (HCC) Active Problems:   Permanent atrial fibrillation (HCC)   Essential hypertension, benign   Type II diabetes mellitus ([place])   DM (diabetes mellitus), type 2 with renal complications ([place])  ESRD (end stage renal disease) ([place])   DNR (do not resuscitate) discussion   Failure to thrive in adult   Hyperkalemia   Hypernatremia   Coagulopathy ([place])     #1 acute on chronic renal failure: Patient has had end-stage renal disease which seems to be worsening now.  She is unable to function well and thrive.  Will admit patient mainly for palliative care.  Hold Lasix.  Gentle bicarb drip.  Consult hospice palliative care in the morning.  #2 hyperkalemia: Secondary to renal failure.  Treated with insulin and glucose in the ER.  Already on bicarb.  #3 end-stage renal disease: Not a candidate for hemodialysis from previous admission.  May involve nephrology if family desire  #4 diabetes: Sliding scale insulin  #5 hypernatremia: Most likely due to dehydration.  Continue gentle hydration and monitor sodium level  #6 failure to thrive: Patient will be involved with palliative care.  #7 atrial fibrillation: Rate appears controlled.  She is hypercoagulable on her warfarin.  Hold warfarin and resume when INR is low.    DVT prophylaxis: Warfarin but on hold Code Status: DNR Family Communication: Family not reachable tonight Disposition Plan: To be determined Consults called: None Admission status: Inpatient  Severity of Illness: The appropriate patient status for this patient is INPATIENT. Inpatient status is judged to be reasonable and necessary in order to provide the required intensity of service to ensure the patient's safety. The patient's presenting symptoms, physical exam findings, and initial radiographic and laboratory data in the  context of their chronic comorbidities is felt to place them at high risk for further clinical deterioration. Furthermore, it is not anticipated that the patient will be medically stable for discharge from the hospital within 2 midnights of admission. The following factors support the patient status of inpatient.   " The patient's presenting symptoms include weakness and altered mental status. " The worrisome physical exam findings include confusion. " The initial radiographic and laboratory data are worrisome because of normal electrolytes. " The chronic co-morbidities include end-stage renal disease with diabetes.   * I certify that at the point of admission it is my clinical judgment that the patient will require inpatient hospital care spanning beyond 2 midnights from the point of admission due to high intensity of service, high risk for further deterioration and high frequency of surveillance required.Barbette Merino MD [facility] Pager [phone]  If 7PM-7AM, please contact night-coverage [email or web address] Password [facility]  08/31/2019, 11:34 PM

## 2019-08-31 NOTE — ED Provider Notes (Signed)
[facility] DEPT Provider Note   CSN: [id number] Arrival date & time: 08/31/19  2032     History Chief Complaint  Patient presents with  . Altered Mental Status  . Irregular Heart Beat    Patricia Randall is a 86 y.o. female.  HPI Patient presenting for evaluation of hyperkalemia and failure to thrive.  She recently had a consultation with palliative care who confirmed DNR, and signed a MOST form.  Patient is unable to give additional history.  Level 5 caveat-altered mental status  Information with the patient as follows: Chest x-ray done today, indicated mild congestive heart failure.  With this finding she was treated with IM Lasix 10 mg.  Laboratory testing done, 2-day-potassium 6.2, CO2 13.2, anion gap 22, BUN 61, creatinine 3.8, total protein 5.5, albumin 2.6, GFR 11.7, hemoglobin 9.3, platelets 135.  Remainder of the CBC, and c-Met, were normal.     Past Medical History:  Diagnosis Date  . Atrial fibrillation ([place])    a. 03/2013 s/p TEE/DCCV;  b. 03/2013 Eliquis initiated.  . Chronic systolic CHF (congestive heart failure) ([place])    a. 03/2013 Echo: EF 20-25%, diff HK, mild to mod MR, sev dil LA, mild RV dysfxn, sev dil RA, mod TR.  . Claudication ([place])   . Glaucoma   . High cholesterol   . Hypertension   . Type II diabetes mellitus [facility])     Patient Active Problem List   Diagnosis Date Noted  . Palliative care by specialist   . DNR (do not resuscitate) discussion   . DNR (do not resuscitate)   . Goals of care, counseling/discussion   . Muscular weakness   . Failure to thrive in adult   . ESRD (end stage renal disease) ([place]) 07/11/2019  . COVID-19 virus infection 07/05/2019  . DM (diabetes mellitus), type 2 with renal complications ([place]) 69/62/9528  . AKI (acute kidney injury) ([place]) 07/05/2019  . Limb ischemia   . CHF exacerbation ([place]) 11/20/2017  . Type II diabetes mellitus ([place]) 11/13/2017  . Ulcer of right lower extremity ([place])  11/13/2017  . Acute on chronic systolic CHF (congestive heart failure) ([place]) 09/26/2017  . Protein-calorie malnutrition, severe 07/05/2015  . CKD (chronic kidney disease), stage III ([place])   . Essential hypertension, benign 03/13/2014  . Cardiomyopathy ([place]) 04/01/2013  . Permanent atrial fibrillation ([place]) 03/31/2013    Past Surgical History:  Procedure Laterality Date  . CARDIOVERSION N/A 04/02/2013   Procedure: CARDIOVERSION;  Surgeon: Thayer Headings, MD;  Location: [facility];  Service: Cardiovascular;  Laterality: N/A;  Spoke with Tom   . ESOPHAGOGASTRODUODENOSCOPY N/A 05/26/2016   Procedure: ESOPHAGOGASTRODUODENOSCOPY (EGD);  Surgeon: Ladene Artist, MD;  Location: [facility] ENDOSCOPY;  Service: Endoscopy;  Laterality: N/A;  . EYE SURGERY Right    "had blood in it"   . IR FLUORO GUIDE CV LINE RIGHT  07/10/2019  . IR US GUIDE VASC ACCESS RIGHT  07/10/2019  . TEE WITHOUT CARDIOVERSION N/A 04/02/2013   Procedure: TRANSESOPHAGEAL ECHOCARDIOGRAM (TEE);  Surgeon: Thayer Headings, MD;  [facility]: [facility] ENDOSCOPY;  Service: Cardiovascular;  Laterality: N/A;     OB History   No obstetric history on file.     Family History  Problem Relation Age of Onset  . Other Mother        died in her 22's - 'just got sick.'  . Other Father        died @ 47, unknown cause.  . Diabetes Brother   .  Other Other        12 siblings + 7 more 1/2 siblings.    Social History   Tobacco Use  . Smoking status: Never Smoker  . Smokeless tobacco: Never Used  Substance Use Topics  . Alcohol use: No  . Drug use: No    Home Medications Prior to Admission medications   Medication Sig Start Date End Date Taking? Authorizing Provider  acetaminophen (TYLENOL) 500 MG tablet Take 500 mg by mouth every 6 (six) hours as needed for mild pain.   Yes [provider]  feeding supplement, GLUCERNA SHAKE, (GLUCERNA SHAKE) LIQD Take 237 mLs by mouth daily.   Yes [provider]  hydrOXYzine  (ATARAX/VISTARIL) 10 MG tablet Take 10 mg by mouth in the morning and at bedtime. x3days started 3.11.21   Yes [provider]  LORazepam (ATIVAN) 1 MG tablet Take 1 mg by mouth every 4 (four) hours as needed for anxiety.   Yes [provider]  Menthol, Topical Analgesic, (BIOFREEZE EX) Apply 1 application topically See admin instructions. apply to entire back qam   Yes [provider]  metoprolol succinate (TOPROL-XL) 100 MG 24 hr tablet Take 1 tablet (100 mg total) by mouth daily. Take with or immediately following a meal. 11/25/17  Yes Rai, Ripudeep K, MD  ondansetron (ZOFRAN) 4 MG tablet Take 4 mg by mouth every 8 (eight) hours as needed for nausea or vomiting.   Yes [provider]  oxyCODONE (ROXICODONE INTENSOL) 20 MG/ML concentrated solution Take 5 mg by mouth every 4 (four) hours as needed for severe pain.   Yes [provider]  polyethylene glycol (MIRALAX / GLYCOLAX) packet Take 17 g by mouth daily as needed for mild constipation. 09/29/17  Yes Lavina Hamman, MD  sennosides-docusate sodium (SENOKOT-S) 8.6-50 MG tablet Take 2 tablets by mouth daily as needed for constipation.   Yes [provider]  warfarin (COUMADIN) 4 MG tablet Take 4 mg by mouth daily.    Yes [provider]  ACCU-CHEK AVIVA PLUS test strip TO CHECK BLOOD GLUCOSE ONCE DAILY **DX CODE: E11.9** 01/30/18   [provider]  ACCU-CHEK SOFTCLIX LANCETS lancets USE TO CHECK BLOOD GLUCOSE ONCE DAILY **DX CODE: E11.9** 01/30/18   [provider]  amLODipine (NORVASC) 5 MG tablet Take 1 tablet (5 mg total) by mouth daily. Patient not taking: Reported on 08/31/2019 07/17/19   Damita Lack, MD  Blood Glucose Monitoring Suppl ([place]) w/Device KIT TO CHECK BLOOD GLUCOSE ONCE DAILY **DX CODE: E11.9** 01/30/18   [provider]  furosemide (LASIX) 10 MG/ML solution Take 10 mg by mouth once. Took 3.12.21    [provider]    furosemide (LASIX) 20 MG tablet Take 20 mg by mouth daily. x2 days start 3.13.21    [provider]  lovastatin (MEVACOR) 40 MG tablet Take 1 tablet (40 mg total) by mouth at bedtime. Patient not taking: Reported on 08/31/2019 11/15/17   Roxan Hockey, MD    Allergies    Patient has no known allergies.  Review of Systems   Review of Systems  Unable to perform ROS: Mental status change    Physical Exam Updated Vital Signs BP 122/72   Pulse 97   Temp 97.7 F (36.5 C)   Resp 17   Ht '5\' 2"'  (1.575 m)   Wt 70 kg   SpO2 100%   BMI 28.23 kg/m   Physical Exam Vitals and nursing note reviewed.  Constitutional:  General: She is not in acute distress.    Appearance: She is well-developed. She is ill-appearing. She is not toxic-appearing or diaphoretic.  HENT:     Head: Normocephalic and atraumatic.     Right Ear: External ear normal.     Left Ear: External ear normal.  Eyes:     Conjunctiva/sclera: Conjunctivae normal.     Pupils: Pupils are equal, round, and reactive to light.  Neck:     Trachea: Phonation normal.  Cardiovascular:     Rate and Rhythm: Regular rhythm. Tachycardia present.     Heart sounds: Normal heart sounds.     Comments: No JVD Pulmonary:     Effort: Pulmonary effort is normal. No respiratory distress.     Breath sounds: No stridor. Rhonchi present.  Abdominal:     General: There is no distension.     Palpations: Abdomen is soft.     Tenderness: There is no abdominal tenderness.  Musculoskeletal:        General: Normal range of motion.     Cervical back: Normal range of motion and neck supple.     Right lower leg: No edema.     Left lower leg: No edema.  Skin:    General: Skin is warm and dry.     Coloration: Skin is not jaundiced or pale.  Neurological:     Mental Status: She is alert.     Cranial Nerves: No cranial nerve deficit.     Motor: No abnormal muscle tone.     Coordination: Coordination normal.     Comments: No  dysarthria or aphasia  Psychiatric:        Mood and Affect: Mood normal.        Behavior: Behavior normal.     ED Results / Procedures / Treatments   Labs (all labs ordered are listed, but only abnormal results are displayed) Labs Reviewed  COMPREHENSIVE METABOLIC PANEL - Abnormal; Notable for the following components:      Result Value   Sodium 146 (*)    Potassium 5.2 (*)    Chloride 112 (*)    CO2 18 (*)    Glucose, Bld 149 (*)    BUN 68 (*)    Creatinine, Ser 4.16 (*)    Albumin 2.6 (*)    GFR calc non Af Amer 9 (*)    GFR calc Af Amer 10 (*)    Anion gap 16 (*)    All other components within normal limits  CBC - Abnormal; Notable for the following components:   RBC 3.66 (*)    Hemoglobin 10.7 (*)    HCT 32.1 (*)    RDW 20.4 (*)    nRBC 2.2 (*)    All other components within normal limits  CBG MONITORING, ED - Abnormal; Notable for the following components:   Glucose-Capillary 128 (*)    All other components within normal limits  SARS CORONAVIRUS 2 (TAT 6-24 HRS)  PROTIME-INR    EKG EKG Interpretation  Date/Time:  Friday August 31 2019 20:54:29 EST Ventricular Rate:  114 PR Interval:    QRS Duration: 107 QT Interval:  327 QTC Calculation: 451 R Axis:   -71 Text Interpretation: Atrial fibrillation Left anterior fascicular block LVH with secondary repolarization abnormality Probable anterior infarct, age indeterminate Since last tracing rate faster Confirmed by Daleen Bo [id number]) on 08/31/2019 9:09:41 PM   Radiology DG Chest Port 1 View  Result Date: 08/31/2019 CLINICAL DATA:  86 year old female with weakness.  EXAM: PORTABLE CHEST 1 VIEW COMPARISON:  Chest radiograph dated 07/05/2019. FINDINGS: There is stable cardiomegaly. Bibasilar hazy densities may represent atelectasis or small pleural effusions. No vascular congestion or edema. No focal consolidation or pneumothorax. Atherosclerotic calcification of the aorta. No acute osseous pathology. IMPRESSION:  Stable cardiomegaly with possible small bilateral pleural effusions. No focal consolidation. Electronically Signed   By: Anner Crete M.D.   On: 08/31/2019 21:51    Procedures Procedures (including critical care time)  Medications Ordered in ED Medications  0.9 %  sodium chloride infusion ( Intravenous New Bag/Given 08/31/19 2205)    ED Course  I have reviewed the triage vital signs and the nursing notes.  Pertinent labs & imaging results that were available during my care of the patient were reviewed by me and considered in my medical decision making (see chart for details).  Clinical Course as of Aug 30 2308  Fri Aug 31, 2019  2300 Normal except sodium high, potassium high, chloride high, CO2 low, glucose high, BUN high, creatinine high, albumin low, GFR low, anion gap high  Comprehensive metabolic panel(!) [EW]  2505 Normal except hemoglobin low  CBC(!) [EW]  2302 Mild elevation  CBG monitoring, ED(!) [EW]  2307 Per radiologist, unchanged possible small bilateral pleural effusions  DG Chest Port 1 View [EW]    Clinical Course User Index [EW] Daleen Bo, MD   MDM Rules/Calculators/A&P                       Patient Vitals for the past 24 hrs:  BP Temp Pulse Resp SpO2 Height Weight  08/31/19 2300 122/72 -- 97 17 100 % -- --  08/31/19 2230 128/76 -- (!) 104 (!) 30 100 % -- --  08/31/19 2200 (!) 126/92 -- -- (!) 21 -- -- --  08/31/19 2130 128/87 -- -- 19 -- -- --  08/31/19 2053 -- -- -- 19 100 % -- --  08/31/19 2048 -- -- -- -- -- '5\' 2"'  (1.575 m) 70 kg  08/31/19 2043 (!) 129/111 97.7 F (36.5 C) (!) 110 -- -- -- --  08/31/19 2042 -- -- -- -- 98 % -- --    11:10 PM Reevaluation with update and discussion. After initial assessment and treatment, an updated evaluation reveals no change in status.  Attempted to contact family member, no answer. Daleen Bo   Medical Decision Making: Patient presenting for evaluation of hyperkalemia, on repeat testing the potassium  is elevated but much lower than when checked as an outpatient.  Also a significant hypernatremia and hyperchloremia.  Creatinine elevated however it is lower than 1 month ago.  Chest x-ray, unchanged from prior when she had suspected small bilateral pleural effusions.  Patient will be management difficult, secondary to pulmonary fluid status and hypernatremia and hyperchloremia.  Creatinine improved from 2 month ago.  At that time she was receiving dialysis.  Hospitalization and treatment with fluids is within the scope of her palliative care plan.  CRITICAL CARE- no Performed by: Daleen Bo   Nursing Notes Reviewed/ Care Coordinated Applicable Imaging Reviewed Interpretation of Laboratory Data incorporated into ED treatment  11:11 PM-Consult complete with hospitalist. Patient case explained and discussed. He agrees to admit patient for further evaluation and treatment. Call ended at 11:25 PM  Plan: Admit   Final Clinical Impression(s) / ED Diagnoses Final diagnoses:  Hypernatremia  Hyperchloremia  Renal insufficiency    Rx / DC Orders ED Discharge Orders    None  Daleen Bo, MD 08/31/19 2326

## 2019-09-01 ENCOUNTER — Other Ambulatory Visit: Payer: Self-pay

## 2019-09-01 ENCOUNTER — Encounter (HOSPITAL_COMMUNITY): Payer: Self-pay | Admitting: Internal Medicine

## 2019-09-01 LAB — COMPREHENSIVE METABOLIC PANEL
ALT: 26 U/L (ref 0–44)
AST: 26 U/L (ref 15–41)
Albumin: 1.9 g/dL — ABNORMAL LOW (ref 3.5–5.0)
Alkaline Phosphatase: 74 U/L (ref 38–126)
Anion gap: 13 (ref 5–15)
BUN: 59 mg/dL — ABNORMAL HIGH (ref 8–23)
CO2: 17 mmol/L — ABNORMAL LOW (ref 22–32)
Calcium: 7.4 mg/dL — ABNORMAL LOW (ref 8.9–10.3)
Chloride: 116 mmol/L — ABNORMAL HIGH (ref 98–111)
Creatinine, Ser: 3.49 mg/dL — ABNORMAL HIGH (ref 0.44–1.00)
GFR calc Af Amer: 13 mL/min — ABNORMAL LOW (ref >60)
GFR calc non Af Amer: 11 mL/min — ABNORMAL LOW (ref >60)
Glucose, Bld: 126 mg/dL — ABNORMAL HIGH (ref 70–99)
Potassium: 4.3 mmol/L (ref 3.5–5.1)
Sodium: 146 mmol/L — ABNORMAL HIGH (ref 135–145)
Total Bilirubin: 1 mg/dL (ref 0.3–1.2)
Total Protein: 5.1 g/dL — ABNORMAL LOW (ref 6.5–8.1)

## 2019-09-01 LAB — PROTIME-INR
INR: 7.4 (ref 0.8–1.2)
Prothrombin Time: 63.2 seconds — ABNORMAL HIGH (ref 11.4–15.2)

## 2019-09-01 LAB — SARS CORONAVIRUS 2 (TAT 6-24 HRS): SARS Coronavirus 2: POSITIVE — AB

## 2019-09-01 LAB — CBC
HCT: 28.3 % — ABNORMAL LOW (ref 36.0–46.0)
Hemoglobin: 9.2 g/dL — ABNORMAL LOW (ref 12.0–15.0)
MCH: 28.7 pg (ref 26.0–34.0)
MCHC: 32.5 g/dL (ref 30.0–36.0)
MCV: 88.2 fL (ref 80.0–100.0)
Platelets: 122 10*3/uL — ABNORMAL LOW (ref 150–400)
RBC: 3.21 MIL/uL — ABNORMAL LOW (ref 3.87–5.11)
RDW: 20.7 % — ABNORMAL HIGH (ref 11.5–15.5)
WBC: 7.3 10*3/uL (ref 4.0–10.5)
nRBC: 2 % — ABNORMAL HIGH (ref 0.0–0.2)

## 2019-09-01 LAB — GLUCOSE, CAPILLARY
Glucose-Capillary: 100 mg/dL — ABNORMAL HIGH (ref 70–99)
Glucose-Capillary: 130 mg/dL — ABNORMAL HIGH (ref 70–99)
Glucose-Capillary: 182 mg/dL — ABNORMAL HIGH (ref 70–99)
Glucose-Capillary: 146 mg/dL — ABNORMAL HIGH (ref 70–99)

## 2019-09-01 LAB — CBG MONITORING, ED: Glucose-Capillary: 137 mg/dL — ABNORMAL HIGH (ref 70–99)

## 2019-09-01 LAB — HEMOGLOBIN A1C
Hgb A1c MFr Bld: 7.9 % — ABNORMAL HIGH (ref 4.8–5.6)
Mean Plasma Glucose: 180.03 mg/dL

## 2019-09-01 LAB — MRSA PCR SCREENING: MRSA by PCR: NEGATIVE

## 2019-09-01 MED ORDER — HYDROXYZINE HCL 10 MG PO TABS
10.0000 mg | ORAL_TABLET | Freq: Three times a day (TID) | ORAL | Status: DC
  Administered 2019-09-01 – 2019-09-03 (×7): 10 mg via ORAL
  Filled 2019-09-01 (×8): qty 1

## 2019-09-01 MED ORDER — METOPROLOL SUCCINATE ER 50 MG PO TB24
100.0000 mg | ORAL_TABLET | Freq: Every day | ORAL | Status: DC
  Administered 2019-09-01 – 2019-09-03 (×3): 100 mg via ORAL
  Filled 2019-09-01 (×4): qty 2

## 2019-09-01 MED ORDER — CAMPHOR-MENTHOL 0.5-0.5 % EX LOTN
1.0000 "application " | TOPICAL_LOTION | Freq: Three times a day (TID) | CUTANEOUS | Status: DC | PRN
  Filled 2019-09-01: qty 222

## 2019-09-01 MED ORDER — POLYETHYLENE GLYCOL 3350 17 G PO PACK: 17.0000 g | PACK | Freq: Every day | ORAL | Status: DC | PRN

## 2019-09-01 MED ORDER — AMLODIPINE BESYLATE 5 MG PO TABS
2.5000 mg | ORAL_TABLET | Freq: Every day | ORAL | Status: DC
  Administered 2019-09-01 – 2019-09-02 (×2): 2.5 mg via ORAL
  Filled 2019-09-01 (×2): qty 1

## 2019-09-01 MED ORDER — HYDROXYZINE HCL 25 MG PO TABS: 25.0000 mg | ORAL_TABLET | Freq: Three times a day (TID) | ORAL | Status: DC | PRN

## 2019-09-01 MED ORDER — DOCUSATE SODIUM 283 MG RE ENEM
1.0000 | ENEMA | RECTAL | Status: DC | PRN
  Filled 2019-09-01: qty 1

## 2019-09-01 MED ORDER — ACETAMINOPHEN 650 MG RE SUPP: 650.0000 mg | Freq: Four times a day (QID) | RECTAL | Status: DC | PRN

## 2019-09-01 MED ORDER — ACETAMINOPHEN 500 MG PO TABS: 500.0000 mg | ORAL_TABLET | Freq: Four times a day (QID) | ORAL | Status: DC | PRN

## 2019-09-01 MED ORDER — ONDANSETRON HCL 4 MG PO TABS: 4.0000 mg | ORAL_TABLET | Freq: Four times a day (QID) | ORAL | Status: DC | PRN

## 2019-09-01 MED ORDER — SORBITOL 70 % SOLN
30.0000 mL | Freq: Once | Status: AC
  Administered 2019-09-01: 30 mL via ORAL
  Filled 2019-09-01: qty 30

## 2019-09-01 MED ORDER — GLUCERNA SHAKE PO LIQD
237.0000 mL | ORAL | Status: DC
  Administered 2019-09-02 – 2019-09-04 (×3): 237 mL via ORAL
  Filled 2019-09-01 (×4): qty 237

## 2019-09-01 MED ORDER — ZOLPIDEM TARTRATE 5 MG PO TABS: 5.0000 mg | ORAL_TABLET | Freq: Every evening | ORAL | Status: DC | PRN

## 2019-09-01 MED ORDER — ONDANSETRON HCL 4 MG PO TABS: 4.0000 mg | ORAL_TABLET | Freq: Three times a day (TID) | ORAL | Status: DC | PRN

## 2019-09-01 MED ORDER — INSULIN ASPART 100 UNIT/ML ~~LOC~~ SOLN
0.0000 [IU] | Freq: Three times a day (TID) | SUBCUTANEOUS | Status: DC
  Administered 2019-09-01 – 2019-09-03 (×2): 1 [IU] via SUBCUTANEOUS
  Administered 2019-09-03: 13:00:00 2 [IU] via SUBCUTANEOUS
  Filled 2019-09-01: qty 0.06

## 2019-09-01 MED ORDER — ACETAMINOPHEN 325 MG PO TABS: 650.0000 mg | ORAL_TABLET | Freq: Four times a day (QID) | ORAL | Status: DC | PRN

## 2019-09-01 MED ORDER — PHYTONADIONE 5 MG PO TABS
10.0000 mg | ORAL_TABLET | Freq: Once | ORAL | Status: AC
  Administered 2019-09-01: 10 mg via ORAL
  Filled 2019-09-01: qty 2

## 2019-09-01 MED ORDER — OXYCODONE HCL 5 MG PO TABS
5.0000 mg | ORAL_TABLET | ORAL | Status: DC | PRN
  Administered 2019-09-01: 5 mg via ORAL
  Filled 2019-09-01: qty 1

## 2019-09-01 MED ORDER — SORBITOL 70 % SOLN
30.0000 mL | Status: DC | PRN
  Filled 2019-09-01: qty 30

## 2019-09-01 MED ORDER — ONDANSETRON HCL 4 MG/2ML IJ SOLN: 4.0000 mg | Freq: Four times a day (QID) | INTRAMUSCULAR | Status: DC | PRN

## 2019-09-01 MED ORDER — INSULIN ASPART 100 UNIT/ML ~~LOC~~ SOLN
0.0000 [IU] | Freq: Every day | SUBCUTANEOUS | Status: DC
  Filled 2019-09-01: qty 0.05

## 2019-09-01 MED ORDER — NEPRO/CARBSTEADY PO LIQD
237.0000 mL | Freq: Three times a day (TID) | ORAL | Status: DC | PRN
  Filled 2019-09-01: qty 237

## 2019-09-01 MED ORDER — STERILE WATER FOR INJECTION IV SOLN
INTRAVENOUS | Status: DC
  Filled 2019-09-01: qty 850

## 2019-09-01 MED ORDER — DEXTROSE 5 % IV SOLN: INTRAVENOUS | Status: DC

## 2019-09-01 MED ORDER — CALCIUM CARBONATE ANTACID 1250 MG/5ML PO SUSP
500.0000 mg | Freq: Four times a day (QID) | ORAL | Status: DC | PRN
  Filled 2019-09-01: qty 5

## 2019-09-01 MED ORDER — SENNOSIDES-DOCUSATE SODIUM 8.6-50 MG PO TABS: 2.0000 | ORAL_TABLET | Freq: Every day | ORAL | Status: DC | PRN

## 2019-09-01 MED ORDER — LORAZEPAM 1 MG PO TABS: 1.0000 mg | ORAL_TABLET | ORAL | Status: DC | PRN

## 2019-09-01 NOTE — Evaluation (Signed)
Clinical/Bedside Swallow Evaluation Patient Details  Name: Patricia Randall MRN: [id number] Date of Birth: Oct 11, 1928  Today's Date: 09/01/2019 Time: SLP Start Time (ACUTE ONLY): 1602 SLP Stop Time (ACUTE ONLY): 1619 SLP Time Calculation (min) (ACUTE ONLY): 17 min  Past Medical History:  Past Medical History:  Diagnosis Date  . Atrial fibrillation ([place])    a. 03/2013 s/p TEE/DCCV;  b. 03/2013 Eliquis initiated.  . Chronic systolic CHF (congestive heart failure) ([place])    a. 03/2013 Echo: EF 20-25%, diff HK, mild to mod MR, sev dil LA, mild RV dysfxn, sev dil RA, mod TR.  . Claudication ([place])   . Glaucoma   . High cholesterol   . Hypertension   . Type II diabetes mellitus ([place])    Past Surgical History:  Past Surgical History:  Procedure Laterality Date  . CARDIOVERSION N/A 04/02/2013   Procedure: CARDIOVERSION;  Surgeon: Thayer Headings, MD;  Location: [facility];  Service: Cardiovascular;  Laterality: N/A;  Spoke with Tom   . ESOPHAGOGASTRODUODENOSCOPY N/A 05/26/2016   Procedure: ESOPHAGOGASTRODUODENOSCOPY (EGD);  Surgeon: Ladene Artist, MD;  Location: [facility] ENDOSCOPY;  Service: Endoscopy;  Laterality: N/A;  . EYE SURGERY Right    "had blood in it"   . IR FLUORO GUIDE CV LINE RIGHT  07/10/2019  . IR US GUIDE VASC ACCESS RIGHT  07/10/2019  . TEE WITHOUT CARDIOVERSION N/A 04/02/2013   Procedure: TRANSESOPHAGEAL ECHOCARDIOGRAM (TEE);  Surgeon: Thayer Headings, MD;  Location: [facility];  Service: Cardiovascular;  Laterality: N/A;   HPI:  Patricia Randall is a 86 y.o. female with medical history significant of end-stage renal disease not currently on hemodialysis due to intolerance, atrial fibrillation, diabetes, hypertension, who was on hospice care residential after admission to the hospital in January was COVID-19.    Assessment / Plan / Recommendation Clinical Impression  Pt was seen for a bedside swallow evaluation and she presents with oral dysphagia and suspected functional pharyngeal  phase of the swallow.  Pt was encountered asleep in bed and she roused to moderate verbal stimulation.  Pt consumed trials of ice chips, thin liquid, puree, and regular solids.  She exhibited prolonged mastication of ice chip and regular solid trials secondary to edentulism.  Pt reported that her dentures are at home.  AP transport and swallow initiation appeared timely with all trials and no overt s/sx of aspiration were observed with any trials.  Recommend initiation of Dysphagia 2 (fine chop) solids and thin liquid with meds crushed in puree and full supervision to assist with self feeding and to cue for the following compensatory strategies: 1) Small bites/sips 2) Slow rate of intake 3) Sit upright as possible.  SLP will f/u to monitor diet tolerance.   SLP Visit Diagnosis: Dysphagia, oral phase (R13.11)    Aspiration Risk  Mild aspiration risk    Diet Recommendation Dysphagia 2 (Fine chop);Thin liquid   Liquid Administration via: Cup;Straw Medication Administration: Crushed with puree Supervision: Full supervision/cueing for compensatory strategies;Staff to assist with self feeding Compensations: Slow rate;Small sips/bites Postural Changes: Seated upright at 90 degrees    Other  Recommendations Oral Care Recommendations: Oral care BID;Staff/trained caregiver to provide oral care   Follow up Recommendations Skilled Nursing facility      Frequency and Duration min 2x/week  2 weeks       Prognosis Prognosis for Safe Diet Advancement: [place] Date of Onset: 09/01/19 HPI: Patricia Randall is a 86 y.o. female with  medical history significant of end-stage renal disease not currently on hemodialysis due to intolerance, atrial fibrillation, diabetes, hypertension, who was on hospice care residential after admission to the hospital in January was COVID-19.  Type of Study: Bedside Swallow Evaluation Previous Swallow Assessment: None  Diet Prior to this Study:  NPO Temperature Spikes Noted: No Respiratory Status: Nasal cannula History of Recent Intubation: No Behavior/Cognition: Alert;Cooperative Oral Cavity Assessment: Within Functional Limits Oral Care Completed by SLP: No Oral Cavity - Dentition: Edentulous(reported that dentures are at home ) Self-Feeding Abilities: Needs assist Patient Positioning: Upright in bed Baseline Vocal Quality: Low vocal intensity Volitional Swallow: Unable to elicit    Oral/Motor/Sensory Function Overall Oral Motor/Sensory Function: Other (comment)(Unable to evaluate secondary to pt not following commands )   Ice Chips Ice chips: Within functional limits Presentation: Spoon   Thin Liquid Thin Liquid: Within functional limits Presentation: Spoon;Straw    Nectar Thick Nectar Thick Liquid: Not tested   Honey Thick Honey Thick Liquid: Not tested   Puree Puree: Within functional limits Presentation: Spoon   Solid     Solid: Impaired Presentation: Spoon Oral Phase Impairments: Impaired mastication Oral Phase Functional Implications: Impaired mastication;Oral residue     Colin Mulders., M.S., CCC-SLP Acute Rehabilitation Services Office: [phone]  [place] 09/01/2019,4:29 PM

## 2019-09-01 NOTE — Progress Notes (Signed)
PROGRESS NOTE    Patricia Randall  DGL:[id number] DOB: 1929-05-16 DOA: 08/31/2019 PCP: Leonard Downing, MD   Brief Narrative: 86 year old with past medical history significant for end-stage renal disease not on hemodialysis due to intolerance, A. fib, diabetes, hypertension who was on hospice care residential after admission to the hospital in January.  Patricia Randall apparently got better and was discharged from hospice to skilled nursing facility.  She has been in the facility with worsening renal function since her admission in January.  Patricia Randall was brought today ED today due to weakness and lethargy and worsening renal function.  She has severe systolic heart failure ejection fraction 30 to 35%.  She has been on Lasix.  She has poor oral intake. Evaluation in the ED respiration 30 pulse 110, white count 8.8, hemoglobin 10, sodium 146 potassium 5.2, BUN 68, creatinine 4.1.  INR 8.  Chest x-ray stable cardiomegaly with possible small bilateral pleural effusion.   Assessment & Plan:   Principal Problem:   Acute on chronic renal failure (HCC) Active Problems:   Permanent atrial fibrillation (HCC)   Essential hypertension, benign   Type II diabetes mellitus ([place])   DM (diabetes mellitus), type 2 with renal complications ([place])   ESRD (end stage renal disease) ([place])   DNR (do not resuscitate) discussion   Failure to thrive in adult   Hyperkalemia   Hypernatremia   Coagulopathy ([place])   1-Acute on chronic end-stage renal disease She was not able to tolerate hemodialysis, no candidate for outpatient hemodialysis per prior admissions She was a started on IV bicarb drip, Lasix on hold. Fattening has decreased to 3.4 Palliative care consulted  2-Hyperatremia: Change IV fluid to D5  3-Failure to Thrive: Continue with IV fluids Palliative care consulted  4-chronic systolic heart failure: Compensated IV fluids to avoid overload  5-hyperkalemia: Resolved Received bicarb drip  Diabetes:  Sliding scale insulin.  A. fib: Hold warfarin due to supratherapeutic INR. Will give one-time dose of vitamin K.  Hypertension continue with Toprol and resume Norvasc Covid pending.   Anemia of chronic diseases  related to renal failure.  Monitor hb.   Estimated body mass index is 28.23 kg/m as calculated from the following:   Height as of this encounter: 5\' 2"  (1.575 m).   Weight as of this encounter: 70 kg.   DVT prophylaxis: Supratherapeutic INR hold anticoagulation Code Status: DNR Family Communication: Son over phone, discussed with family Patricia Randall might be in cycle of recurrent dehydration, confusion in setting of her ESRD and Heart failuree and FFT. Palliative care consulted.  Disposition Plan:  Patricia Randall is from: Nursing facility Anticipated d/c date: 2 to 3 days, goals of care discussion, monitor renal function. Barriers to d/c or necessity for inpatient status: Patricia Randall requiring IV fluids, speech evaluation  Consultants:   Palliative care  Procedures:   none  Antimicrobials:    Subjective: Sleepy, wake up, talking about her sister.   Objective: Vitals:   09/01/19 0500 09/01/19 0530 09/01/19 0600 09/01/19 0630  BP: (!) 128/107 130/86 (!) 125/113 (!) 126/91  Pulse: 65  (!) 111 (!) 118  Resp: (!) 29 (!) 23 13 17   Temp:      SpO2: 100%  97% 99%  Weight:      Height:        Intake/Output Summary (Last 24 hours) at 09/01/2019 0706 Last data filed at 09/01/2019 0618 Gross per 24 hour  Intake 1000 ml  Output --  Net 1000 ml   [employer]  08/31/19 2048  Weight: 70 kg    Examination:  General exam: Appears calm and comfortable  Respiratory system: Clear to auscultation. Respiratory effort normal. Cardiovascular system: S1 & S2 heard, RRR. No JVD, murmurs, rubs, gallops or clicks. No pedal edema. Gastrointestinal system: Abdomen is nondistended, soft and nontender. No organomegaly or masses felt. Normal bowel sounds heard. Central nervous system:  Alert, not oriented.  Extremities: Symmetric 5 x 5 power. Skin: No rashes, lesions or ulcers   Data Reviewed: I have personally reviewed following labs and imaging studies  CBC: Recent Labs  Lab 08/31/19 2136 09/01/19 0500  WBC 8.8 7.3  HGB 10.7* 9.2*  HCT 32.1* 28.3*  MCV 87.7 88.2  PLT 150 536*   Basic Metabolic Panel: Recent Labs  Lab 08/31/19 2136 09/01/19 0500  NA 146* 146*  K 5.2* 4.3  CL 112* 116*  CO2 18* 17*  GLUCOSE 149* 126*  BUN 68* 59*  CREATININE 4.16* 3.49*  CALCIUM 9.0 7.4*   GFR: Estimated Creatinine Clearance: 10.2 mL/min (A) (by C-G formula based on SCr of 3.49 mg/dL (H)). Liver Function Tests: Recent Labs  Lab 08/31/19 2136 09/01/19 0500  AST 34 26  ALT 35 26  ALKPHOS 97 74  BILITOT 1.1 1.0  PROT 6.5 5.1*  ALBUMIN 2.6* 1.9*   No results for input(s): LIPASE, AMYLASE in the last 168 hours. No results for input(s): AMMONIA in the last 168 hours. Coagulation Profile: Recent Labs  Lab 08/31/19 2213  INR 8.4*   Cardiac Enzymes: No results for input(s): CKTOTAL, CKMB, CKMBINDEX, TROPONINI in the last 168 hours. BNP (last 3 results) No results for input(s): PROBNP in the last 8760 hours. HbA1C: Recent Labs    09/01/19 0018  HGBA1C 7.9*   CBG: Recent Labs  Lab 08/31/19 2108 09/01/19 0026  GLUCAP 128* 137*   Lipid Profile: No results for input(s): CHOL, HDL, LDLCALC, TRIG, CHOLHDL, LDLDIRECT in the last 72 hours. Thyroid Function Tests: No results for input(s): TSH, T4TOTAL, FREET4, T3FREE, THYROIDAB in the last 72 hours. Anemia Panel: No results for input(s): VITAMINB12, FOLATE, FERRITIN, TIBC, IRON, RETICCTPCT in the last 72 hours. Sepsis Labs: No results for input(s): PROCALCITON, LATICACIDVEN in the last 168 hours.  No results found for this or any previous visit (from the past 240 hour(s)).       Radiology Studies: DG Chest Port 1 View  Result Date: 08/31/2019 CLINICAL DATA:  86 year old female with weakness.  EXAM: PORTABLE CHEST 1 VIEW COMPARISON:  Chest radiograph dated 07/05/2019. FINDINGS: There is stable cardiomegaly. Bibasilar hazy densities may represent atelectasis or small pleural effusions. No vascular congestion or edema. No focal consolidation or pneumothorax. Atherosclerotic calcification of the aorta. No acute osseous pathology. IMPRESSION: Stable cardiomegaly with possible small bilateral pleural effusions. No focal consolidation. Electronically Signed   By: Anner Crete M.D.   On: 08/31/2019 21:51        Scheduled Meds: . feeding supplement (GLUCERNA SHAKE)  237 mL Oral Q24H  . hydrOXYzine  10 mg Oral TID  . insulin aspart  0-5 Units Subcutaneous QHS  . insulin aspart  0-6 Units Subcutaneous TID WC  . metoprolol succinate  100 mg Oral Daily   Continuous Infusions: . dextrose    .  sodium bicarbonate (isotonic) infusion in sterile water 50 mL/hr at 09/01/19 0047     LOS: 1 day    Time spent: 35 minutes    Elayjah Chaney A Ilijah Doucet, MD [facility]   If 7PM-7AM, please contact night-coverage [email or web address]  09/01/2019, 7:06 AM

## 2019-09-02 DIAGNOSIS — Z515 Encounter for palliative care: Secondary | ICD-10-CM

## 2019-09-02 DIAGNOSIS — L8990 Pressure ulcer of unspecified site, unspecified stage: Secondary | ICD-10-CM | POA: Insufficient documentation

## 2019-09-02 LAB — PROTIME-INR
INR: 5.5 (ref 0.8–1.2)
Prothrombin Time: 49.9 seconds — ABNORMAL HIGH (ref 11.4–15.2)

## 2019-09-02 LAB — BASIC METABOLIC PANEL
Anion gap: 12 (ref 5–15)
BUN: 65 mg/dL — ABNORMAL HIGH (ref 8–23)
CO2: 19 mmol/L — ABNORMAL LOW (ref 22–32)
Calcium: 8.3 mg/dL — ABNORMAL LOW (ref 8.9–10.3)
Chloride: 112 mmol/L — ABNORMAL HIGH (ref 98–111)
Creatinine, Ser: 3.94 mg/dL — ABNORMAL HIGH (ref 0.44–1.00)
GFR calc Af Amer: 11 mL/min — ABNORMAL LOW (ref >60)
GFR calc non Af Amer: 10 mL/min — ABNORMAL LOW (ref >60)
Glucose, Bld: 176 mg/dL — ABNORMAL HIGH (ref 70–99)
Potassium: 4.1 mmol/L (ref 3.5–5.1)
Sodium: 143 mmol/L (ref 135–145)

## 2019-09-02 LAB — CBC
HCT: 31.3 % — ABNORMAL LOW (ref 36.0–46.0)
Hemoglobin: 10 g/dL — ABNORMAL LOW (ref 12.0–15.0)
MCH: 28.2 pg (ref 26.0–34.0)
MCHC: 31.9 g/dL (ref 30.0–36.0)
MCV: 88.2 fL (ref 80.0–100.0)
Platelets: 128 10*3/uL — ABNORMAL LOW (ref 150–400)
RBC: 3.55 MIL/uL — ABNORMAL LOW (ref 3.87–5.11)
RDW: 20.6 % — ABNORMAL HIGH (ref 11.5–15.5)
WBC: 8.1 10*3/uL (ref 4.0–10.5)
nRBC: 2.1 % — ABNORMAL HIGH (ref 0.0–0.2)

## 2019-09-02 LAB — GLUCOSE, CAPILLARY
Glucose-Capillary: 152 mg/dL — ABNORMAL HIGH (ref 70–99)
Glucose-Capillary: 163 mg/dL — ABNORMAL HIGH (ref 70–99)
Glucose-Capillary: 109 mg/dL — ABNORMAL HIGH (ref 70–99)
Glucose-Capillary: 135 mg/dL — ABNORMAL HIGH (ref 70–99)

## 2019-09-02 MED ORDER — SODIUM BICARBONATE 650 MG PO TABS
650.0000 mg | ORAL_TABLET | Freq: Two times a day (BID) | ORAL | Status: DC
  Administered 2019-09-02 – 2019-09-04 (×5): 650 mg via ORAL
  Filled 2019-09-02 (×5): qty 1

## 2019-09-02 NOTE — Progress Notes (Signed)
CRITICAL VALUE ALERT  Critical Value: INR: 5.5  Date & Time Notied: Notified from lab at [facility]  Provider Notified: Dr. Bunnie Pion, DO at [phone]  Orders Received/Actions taken: Received call back from Dr. Humphrey Rolls at [phone]. Dr. Humphrey Rolls asked what the INR number was yesterday. Yesterday it was 7.4. Dr. Humphrey Rolls said the INR is improving. No new orders at this time. Dr. Humphrey Rolls stated for [place] to continue monitoring it.

## 2019-09-02 NOTE — Progress Notes (Signed)
PROGRESS NOTE    Patricia Randall  XNT:[id number] DOB: 03/03/1929 DOA: 08/31/2019 PCP: Leonard Downing, MD   Brief Narrative: 86 year old with past medical history significant for end-stage renal disease not on hemodialysis due to intolerance, A. fib, diabetes, hypertension who was on hospice care residential after admission to the hospital in January.  Patient apparently got better and was discharged from hospice to skilled nursing facility.  She has been in the facility with worsening renal function since her admission in January.  Patient was brought today ED today due to weakness and lethargy and worsening renal function.  She has severe systolic heart failure ejection fraction 30 to 35%.  She has been on Lasix.  She has poor oral intake. Evaluation in the ED respiration 30 pulse 110, white count 8.8, hemoglobin 10, sodium 146 potassium 5.2, BUN 68, creatinine 4.1.  INR 8.  Chest x-ray stable cardiomegaly with possible small bilateral pleural effusion.   Assessment & Plan:   Principal Problem:   Acute on chronic renal failure (HCC) Active Problems:   Permanent atrial fibrillation (HCC)   Essential hypertension, benign   Type II diabetes mellitus ([place])   DM (diabetes mellitus), type 2 with renal complications ([place])   ESRD (end stage renal disease) ([place])   DNR (do not resuscitate) discussion   Failure to thrive in adult   Hyperkalemia   Hypernatremia   Coagulopathy (HCC)   Pressure injury of skin   1-Acute on chronic end-stage renal disease She was not able to tolerate hemodialysis, no candidate for outpatient hemodialysis per prior admissions She was a started on IV bicarb drip, Lasix on hold. Cr has decreased to 3.4--3.9 Palliative care consulted Stop fluids to avoid volume overload.  Start sodium Bicarb.   2-Hyperatremia: Change IV fluid to D5. Resolved. Stop fluids to avoid overload.    3-Failure to Thrive: Treated with IV fluids.  Dysphagia diet., monitor oral intake.   Palliative care consulted  4-Chronic systolic heart failure: Compensated Stop Fluids.  Monitor.   5-Hyperkalemia: Resolved Received bicarb drip  Diabetes: Sliding scale insulin.  A. fib: Hold warfarin due to supratherapeutic INR. Received one-time dose of vitamin K. INR trending down today 5.  Hypertension; Continue with Toprol and resume Norvasc.  Recent Diagnosis of Covid January 2021. Repeated covid positive. No ned for isolation.   Anemia of chronic diseases  related to renal failure.  Monitor hb.   Estimated body mass index is 24.8 kg/m as calculated from the following:   Height as of this encounter: 5\' 2"  (1.575 m).   Weight as of this encounter: 61.5 kg.   DVT prophylaxis: Supratherapeutic INR hold anticoagulation Code Status: DNR Family Communication: Son over phone, discussed with family patient might be in cycle of recurrent dehydration, confusion in setting of her ESRD and Heart failuree and FFT. Palliative care consulted. 3-13.  Disposition Plan:  Patient is from: Nursing facility Anticipated d/c date: 2 to 3 days, goals of care discussion, monitor renal function. Barriers to d/c or necessity for inpatient status: Monitor oral intake. Palliative care consult.   Consultants:   Palliative care  Procedures:   none  Antimicrobials:    Subjective: Alert, trying to eat breakfast. Denies dyspnea.   Objective: Vitals:   09/01/19 2228 09/02/19 0001 09/02/19 0500 09/02/19 0746  BP: (!) 154/76  110/71   Pulse: 80  86   Resp: 17  20   Temp: 98.2 F (36.8 C)  97.8 F (36.6 C)   TempSrc: Oral  Oral  SpO2: 97%  95%   Weight:  59.3 kg  61.5 kg  Height:  5\' 2"  (1.575 m)      Intake/Output Summary (Last 24 hours) at 09/02/2019 0857 Last data filed at 09/02/2019 0600 Gross per 24 hour  Intake 999.04 ml  Output 100 ml  Net 899.04 ml   Filed Weights   08/31/19 2048 09/02/19 0001 09/02/19 0746  Weight: 70 kg 59.3 kg 61.5 kg     Examination:  General exam: NAD Respiratory system: CTA Cardiovascular system: S 1, S 2 RRR. Gastrointestinal system: BS present, soft Central nervous system: Alert, answer yes and no questions Extremities: no edema    Data Reviewed: I have personally reviewed following labs and imaging studies  CBC: Recent Labs  Lab 08/31/19 2136 09/01/19 0500  WBC 8.8 7.3  HGB 10.7* 9.2*  HCT 32.1* 28.3*  MCV 87.7 88.2  PLT 150 235*   Basic Metabolic Panel: Recent Labs  Lab 08/31/19 2136 09/01/19 0500  NA 146* 146*  K 5.2* 4.3  CL 112* 116*  CO2 18* 17*  GLUCOSE 149* 126*  BUN 68* 59*  CREATININE 4.16* 3.49*  CALCIUM 9.0 7.4*   GFR: Estimated Creatinine Clearance: 9.6 mL/min (A) (by C-G formula based on SCr of 3.49 mg/dL (H)). Liver Function Tests: Recent Labs  Lab 08/31/19 2136 09/01/19 0500  AST 34 26  ALT 35 26  ALKPHOS 97 74  BILITOT 1.1 1.0  PROT 6.5 5.1*  ALBUMIN 2.6* 1.9*   No results for input(s): LIPASE, AMYLASE in the last 168 hours. No results for input(s): AMMONIA in the last 168 hours. Coagulation Profile: Recent Labs  Lab 08/31/19 2213 09/01/19 1040 09/02/19 0401  INR 8.4* 7.4* 5.5*   Cardiac Enzymes: No results for input(s): CKTOTAL, CKMB, CKMBINDEX, TROPONINI in the last 168 hours. BNP (last 3 results) No results for input(s): PROBNP in the last 8760 hours. HbA1C: Recent Labs    09/01/19 0018  HGBA1C 7.9*   CBG: Recent Labs  Lab 09/01/19 0903 09/01/19 1147 09/01/19 1711 09/01/19 2144 09/02/19 0833  GLUCAP 100* 130* 182* 146* 152*   Lipid Profile: No results for input(s): CHOL, HDL, LDLCALC, TRIG, CHOLHDL, LDLDIRECT in the last 72 hours. Thyroid Function Tests: No results for input(s): TSH, T4TOTAL, FREET4, T3FREE, THYROIDAB in the last 72 hours. Anemia Panel: No results for input(s): VITAMINB12, FOLATE, FERRITIN, TIBC, IRON, RETICCTPCT in the last 72 hours. Sepsis Labs: No results for input(s): PROCALCITON, LATICACIDVEN  in the last 168 hours.  Recent Results (from the past 240 hour(s))  MRSA PCR Screening     Status: None   Collection Time: 09/01/19  9:09 AM   Specimen: Nasopharyngeal Swab  Result Value Ref Range Status   MRSA by PCR NEGATIVE NEGATIVE Final    Comment:        The GeneXpert MRSA Assay (FDA approved for NASAL specimens only), is one component of a comprehensive MRSA colonization surveillance program. It is not intended to diagnose MRSA infection nor to guide or monitor treatment for MRSA infections. Performed at [facility], [place] [address] [place], [place] [address]   SARS CORONAVIRUS 2 (TAT 6-24 HRS) Nasopharyngeal Nasopharyngeal Swab     Status: Abnormal   Collection Time: 09/01/19  9:51 AM   Specimen: Nasopharyngeal Swab  Result Value Ref Range Status   SARS Coronavirus 2 POSITIVE (A) NEGATIVE Final    Comment: RESULT CALLED TO, READ BACK BY AND VERIFIED WITH: Margarita Grizzle, RN AT 2113 ON 08/22/2019 BY Epifanio Lesches  S (NOTE) SARS-CoV-2 target nucleic acids are DETECTED. The SARS-CoV-2 RNA is generally detectable in upper and lower respiratory specimens during the acute phase of infection. Positive results are indicative of the presence of SARS-CoV-2 RNA. Clinical correlation with patient history and other diagnostic information is  necessary to determine patient infection status. Positive results do not rule out bacterial infection or co-infection with other viruses.  The expected result is Negative. Fact Sheet for Patients: SugarRoll.be Fact Sheet for Healthcare Providers: https://www.woods-mathews.com/ This test is not yet approved or cleared by the [place] FDA and  has been authorized for detection and/or diagnosis of SARS-CoV-2 by FDA under an Emergency Use Authorization (EUA). This EUA will remain  in effect (meaning this test can be  used) for the duration of the COVID-19 declaration under Section 564(b)(1)  of the Act, 21 U.S.C. section 360bbb-3(b)(1), unless the authorization is terminated or revoked sooner. Performed at [facility], [place] [address], [place], [place] [address]          Radiology Studies: DG Chest [place] 1 View  Result Date: 08/31/2019 CLINICAL DATA:  86 year old female with weakness. EXAM: PORTABLE CHEST 1 VIEW COMPARISON:  Chest radiograph dated 07/05/2019. FINDINGS: There is stable cardiomegaly. Bibasilar hazy densities may represent atelectasis or small pleural effusions. No vascular congestion or edema. No focal consolidation or pneumothorax. Atherosclerotic calcification of the aorta. No acute osseous pathology. IMPRESSION: Stable cardiomegaly with possible small bilateral pleural effusions. No focal consolidation. Electronically Signed   By: Anner Crete M.D.   On: 08/31/2019 21:51        Scheduled Meds: . amLODipine  2.5 mg Oral Daily  . feeding supplement (GLUCERNA SHAKE)  237 mL Oral Q24H  . hydrOXYzine  10 mg Oral TID  . insulin aspart  0-5 Units Subcutaneous QHS  . insulin aspart  0-6 Units Subcutaneous TID WC  . metoprolol succinate  100 mg Oral Daily   Continuous Infusions: . dextrose 45 mL/hr at 09/02/19 0747     LOS: 2 days    Time spent: 35 minutes    Kodie Kishi A Rashanna Christiana, MD [facility]   If 7PM-7AM, please contact night-coverage [email or web address]  09/02/2019, 8:57 AM

## 2019-09-02 NOTE — Progress Notes (Signed)
Pt remains on 2L O2. No SOB noted a this time. Pt received lunch tray. Repositioned and boosted up in bed for lunch. Fresh ice water brought to bedside. Pt currently clean, dry and resting in bed with call light within reach. Educated pt to use call light for assistance if needed. Voiced understanding.

## 2019-09-02 NOTE — Consult Note (Signed)
Consultation Note Date: 09/02/2019   Patient Name: Patricia Randall  DOB: 03/09/1929  MRN: [id number]  Age / Sex: 86 y.o., female  PCP: Leonard Downing, MD Referring Physician: Elmarie Shiley, MD  Reason for Consultation: Establishing goals of care and Psychosocial/spiritual support  HPI/Patient Profile: 86 y.o. female  with past medical history of DM2, HTN, CHF, CKD V unable to tolerate hemodialysis, atrial fibrillation, and COVID 53 in January and discharged to [facility].  She improved and was subsequently discharged to SNF. She was admitted on 08/31/2019 with acute renal failure secondary to failure to thrive.  The patient had stopped eating and drinking at SNF.  Her creatinine was 4.1 with a BUN of 68.  In the hospital she has improved somewhat.  She is eating and drinking small amounts.   Clinical Assessment and Goals of Care:  I have reviewed medical records including EPIC notes, labs and imaging, received report from Dr. Tyrell Antonio, examined the patient and spoke on the phone with her son Layana Konkel  to discuss diagnosis prognosis, [place], EOL wishes, disposition and options.  Mr. Fazzino is the patient's son and HCPOA.  He is familiar with Palliative Medicine Team from our consultation 07/15/2019 with Tacey Ruiz, NP, PMT.  We discussed her current illness and what it means in the larger context of her on-going co-morbidities.  Natural disease trajectory and expectations at EOL were discussed.  [name] understands that his mother is failing at SNF as she is not eating or drinking there.  "She just doesn't like their food.  I'd bring her food every day if they would let me."    Hospice and Palliative Care services outpatient were explained and offered.  [name] explains that the nursing home will be "taking her social security check" when she goes back as she is unable to rehab.  As this is  the case  - if [name] is not doing rehab she is eligible for Hospice services at [facility].   [name] was encouraged and wanted his mother to receive Hospice services at the nursing home.  Questions and concerns were addressed.  The family was encouraged to call with questions or concerns.   Afterward I spoke with Freddie Breech of Hospice.  She agreed that if the facility was taking [name]'s social security check then Hospice could be filed under Medicare A.  [facility] will take the necessary steps on their end to put hospice in place at the facility.   Primary Decision Maker:  HCPOA Derrill Memo, son    SUMMARY OF RECOMMENDATIONS    Please write in DC summary "[place] to follow at facility"  Code Status/Advance Care Planning:  DNR   Symptom Management:   Liberalize diet to improve taste and encourage patient to eat.  Feeding assistance at facility.    Palliative Prophylaxis:   Frequent Pain Assessment  Psycho-social/Spiritual:   Desire for further Chaplaincy support: Not discussed.  Prognosis: less than 6 months - likely significantly less as patient is not  eating or drinking well.    Discharge Planning: [place] with Hospice      Primary Diagnoses: Present on Admission: . Permanent atrial fibrillation ([place]) . Essential hypertension, benign . DM (diabetes mellitus), type 2 with renal complications ([place]) . ESRD (end stage renal disease) ([place]) . Failure to thrive in adult . Acute on chronic renal failure ([place]) . Hyperkalemia . Hypernatremia . Coagulopathy ([place])   I have reviewed the medical record, interviewed the patient and family, and examined the patient. The following aspects are pertinent.  Past Medical History:  Diagnosis Date  . Atrial fibrillation ([place])    a. 03/2013 s/p TEE/DCCV;  b. 03/2013 Eliquis initiated.  . Chronic systolic CHF (congestive heart failure) ([place])    a. 03/2013 Echo: EF 20-25%, diff HK, mild to  mod MR, sev dil LA, mild RV dysfxn, sev dil RA, mod TR.  . Claudication ([place])   . Glaucoma   . High cholesterol   . Hypertension   . Type II diabetes mellitus ([place])    Social History   Socioeconomic History  . Marital status: Widowed    Spouse name: Not on file  . Number of children: Not on file  . Years of education: Not on file  . Highest education level: Not on file  Occupational History  . Not on file  Tobacco Use  . Smoking status: Never Smoker  . Smokeless tobacco: Never Used  Substance and Sexual Activity  . Alcohol use: No  . Drug use: No  . Sexual activity: Never  Other Topics Concern  . Not on file  Social History Narrative   Lives in [place] with her son.   Social Determinants of Health   Financial Resource Strain:   . Difficulty of Paying Living Expenses:   Food Insecurity:   . Worried About Charity fundraiser in the Last Year:   . Arboriculturist in the Last Year:   Transportation Needs:   . Film/video editor (Medical):   Marland Kitchen Lack of Transportation (Non-Medical):   Physical Activity:   . Days of Exercise per Week:   . Minutes of Exercise per Session:   Stress:   . Feeling of Stress :   Social Connections:   . Frequency of Communication with Friends and Family:   . Frequency of Social Gatherings with Friends and Family:   . Attends Religious Services:   . Active Member of Clubs or Organizations:   . Attends Archivist Meetings:   Marland Kitchen Marital Status:    Family History  Problem Relation Age of Onset  . Other Mother        died in her 85's - 'just got sick.'  . Other Father        died @ 39, unknown cause.  . Diabetes Brother   . Other Other        12 siblings + 7 more 1/2 siblings.   Scheduled Meds: . amLODipine  2.5 mg Oral Daily  . feeding supplement (GLUCERNA SHAKE)  237 mL Oral Q24H  . hydrOXYzine  10 mg Oral TID  . insulin aspart  0-5 Units Subcutaneous QHS  . insulin aspart  0-6 Units Subcutaneous TID WC  . metoprolol  succinate  100 mg Oral Daily  . sodium bicarbonate  650 mg Oral BID   Continuous Infusions: PRN Meds:.acetaminophen **OR** acetaminophen, calcium carbonate (dosed in mg elemental calcium), camphor-menthol **AND** hydrOXYzine, docusate sodium, feeding supplement (NEPRO CARB STEADY), LORazepam, ondansetron **OR** ondansetron (  ZOFRAN) IV, oxyCODONE, polyethylene glycol, senna-docusate, sorbitol, zolpidem No Known Allergies  Review of Systems  Denies pain, SOB, constipation  Physical Exam  Well developed elderly frail female, awake, alert, pleasant NAD  Vital Signs: BP 121/88 (BP Location: Right Arm)   Pulse 90   Temp 97.8 F (36.6 C) (Oral)   Resp 18   Ht 5\' 2"  (1.575 m)   Wt 61.5 kg   SpO2 95%   BMI 24.80 kg/m  Pain Scale: 0-10   Pain Score: 0-No pain   SpO2: SpO2: 95 % O2 Device:SpO2: 95 % O2 Flow Rate: .O2 Flow Rate (L/min): 2.5 L/min  IO: Intake/output summary:   Intake/Output Summary (Last 24 hours) at 09/02/2019 1457 Last data filed at 09/02/2019 0600 Gross per 24 hour  Intake 999.04 ml  Output 100 ml  Net 899.04 ml    LBM: Last BM Date: (Unknown) Baseline Weight: Weight: 70 kg Most recent weight: Weight: 61.5 kg     Palliative Assessment/Data: 20%     Time In: 2:00 Time Out: 3:00 Time Total: 60 min. Visit consisted of counseling and education dealing with the complex and emotionally intense issues surrounding the need for palliative care and symptom management in the setting of serious and potentially life-threatening illness. Greater than 50%  of this time was spent counseling and coordinating care related to the above assessment and plan.  Signed by: Florentina Jenny, PA-C Palliative Medicine  Please contact Palliative Medicine Team phone at [phone] for questions and concerns.  For individual provider: See Shea Evans

## 2019-09-02 NOTE — TOC Progression Note (Signed)
Transition of Care [facility]) - Progression Note    Patient Details  Name: Patricia Randall MRN: [id number] Date of Birth: 02-Jun-1929  Transition of Care [facility]) CM/SW [place], [place] Phone Number: [phone] 09/02/2019, 3:50 PM  Clinical Narrative:    Received call from Freddie Breech at [facility] [phone] in reference to patient's Medicare part A.  Ms. Erling Cruz stated she spoke with patient's son and he said the facility where the patient currently resides, is not currently filing Medicare Part A for rehab but still receives patient's SS payments.  Ms. Erling Cruz stated she is going to contact facility and verify this,  if the facility is no longer filing patient's Medicare part A, the patient can go to hospice. Patient currently has a CTI which qualifies her for hospice.        Expected Discharge Plan and Services                                                 Social Determinants of Health (SDOH) Interventions    Readmission Risk Interventions No flowsheet data found.

## 2019-09-02 NOTE — Progress Notes (Signed)
[facility] Documentation    Pt is a current pt in [facility] Palliative program. Liaison to follow pt through course of hospital stay to ensure follow up with Palliative NP upon discharge.     Please outreach with any questions.     Thank you,   Jennifer Love, RN  [facility] Hospital Liaison  [phone]   

## 2019-09-02 NOTE — Progress Notes (Signed)
Spoke with granddaughter, York Cerise, over the phone. Updated on pt's current status. Granddaughter requested to speak with the pt. Transferred phone call into the patients room and got the pt set up to speak with [place]. Pt resting in bed with call light within reach. VSS. No complaints of pain at this time. Voiced no concerns. Will cont to mx.

## 2019-09-02 NOTE — Progress Notes (Signed)
Manufacturing engineer Documentation      Liaison received referral for pt to return to [facility] s/p discharge with hospice services.     Writer contacted son, [name], to confirm interest. Explained hospice services and philosophy and answered all questions. He agrees to hospice services.    Pt is eligible for hospice pending [facility] is no longer filing Med A for services. [facility] billing specialist to outreach [facility] Monday.    Please do not hesitate to outreach with any questions and thank you for the referral.     Freddie Breech, RN  [facility] Liaison  [phone]

## 2019-09-02 NOTE — Progress Notes (Signed)
Pt told granddaughter over the phone she had a BM. Pt's granddaughter called this Therapist, sports. When answering the phone, Desiree immediately yelled, "You need to do your F---ing job. My grandma is lying in s---. This is unacceptable. You and everyone up there need to do their jobs right now!"  This RN apologized to the granddaughter and educated her, "Myself and the PCT, Shajana, were just in her room to ensure she was clean, dry and repositioned for her to enjoy her lunch, but I have no problem with rechecking the patient."  Granddaughter continued to yell at this RN over the phone.  This RN and PCT, Shajana, ensured the pt was clean, dry and comfortable. Repositioned in bed and provided peri-care. Re-educated the pt to use the call light for assistance. Pt voiced understanding. Call light and phone remains within reach. VSS. No SOB noted. No pain voiced at this time. Pt resting comfortably in bed. Will cont to mx.

## 2019-09-03 DIAGNOSIS — N185 Chronic kidney disease, stage 5: Secondary | ICD-10-CM

## 2019-09-03 DIAGNOSIS — N171 Acute kidney failure with acute cortical necrosis: Secondary | ICD-10-CM

## 2019-09-03 LAB — GLUCOSE, CAPILLARY
Glucose-Capillary: 128 mg/dL — ABNORMAL HIGH (ref 70–99)
Glucose-Capillary: 145 mg/dL — ABNORMAL HIGH (ref 70–99)
Glucose-Capillary: 230 mg/dL — ABNORMAL HIGH (ref 70–99)
Glucose-Capillary: 45 mg/dL — ABNORMAL LOW (ref 70–99)
Glucose-Capillary: 61 mg/dL — ABNORMAL LOW (ref 70–99)

## 2019-09-03 LAB — BASIC METABOLIC PANEL
Anion gap: 13 (ref 5–15)
BUN: 68 mg/dL — ABNORMAL HIGH (ref 8–23)
CO2: 18 mmol/L — ABNORMAL LOW (ref 22–32)
Calcium: 7.9 mg/dL — ABNORMAL LOW (ref 8.9–10.3)
Chloride: 109 mmol/L (ref 98–111)
Creatinine, Ser: 4 mg/dL — ABNORMAL HIGH (ref 0.44–1.00)
GFR calc Af Amer: 11 mL/min — ABNORMAL LOW (ref >60)
GFR calc non Af Amer: 9 mL/min — ABNORMAL LOW (ref >60)
Glucose, Bld: 128 mg/dL — ABNORMAL HIGH (ref 70–99)
Potassium: 4.9 mmol/L (ref 3.5–5.1)
Sodium: 140 mmol/L (ref 135–145)

## 2019-09-03 LAB — PROTIME-INR
INR: 5.2 (ref 0.8–1.2)
Prothrombin Time: 48.2 seconds — ABNORMAL HIGH (ref 11.4–15.2)

## 2019-09-03 MED ORDER — SODIUM CHLORIDE 0.9 % IV BOLUS
250.0000 mL | Freq: Once | INTRAVENOUS | Status: AC
  Administered 2019-09-03: 250 mL via INTRAVENOUS

## 2019-09-03 NOTE — Plan of Care (Signed)
  Problem: Activity: Goal: Risk for activity intolerance will decrease Outcome: Progressing   Problem: Skin Integrity: Goal: Risk for impaired skin integrity will decrease Outcome: Progressing   

## 2019-09-03 NOTE — Care Management Important Message (Signed)
Important Message  Patient Details IM Letter given to Patricia Randall SW Case Manager to present to the Patient Name: Patricia Randall MRN: [id number] Date of Birth: 1928-07-18   Medicare Important Message Given:  Yes     Kerin Salen 09/03/2019, 12:06 PM

## 2019-09-03 NOTE — Progress Notes (Addendum)
PROGRESS NOTE    Patricia Randall  IEP:[id number] DOB: March 16, 1929 DOA: 08/31/2019 PCP: Leonard Downing, MD   Brief Narrative: 86 year old with past medical history significant for end-stage renal disease not on hemodialysis due to intolerance, A. fib, diabetes, hypertension who was on hospice care residential after admission to the hospital in January.  Patient apparently got better and was discharged from hospice to skilled nursing facility.  She has been in the facility with worsening renal function since her admission in January.  Patient was brought today ED today due to weakness and lethargy and worsening renal function.  She has severe systolic heart failure ejection fraction 30 to 35%.  She has been on Lasix.  She has poor oral intake. Evaluation in the ED respiration 30 pulse 110, white count 8.8, hemoglobin 10, sodium 146 potassium 5.2, BUN 68, creatinine 4.1.  INR 8.  Chest x-ray stable cardiomegaly with possible small bilateral pleural effusion.   Assessment & Plan:   Principal Problem:   Acute on chronic renal failure (HCC) Active Problems:   Permanent atrial fibrillation (HCC)   Essential hypertension, benign   Type II diabetes mellitus ([place])   DM (diabetes mellitus), type 2 with renal complications ([place])   ESRD (end stage renal disease) ([place])   DNR (do not resuscitate) discussion   Failure to thrive in adult   Hyperkalemia   Hypernatremia   Coagulopathy (HCC)   Pressure injury of skin   Palliative care encounter   1-Acute on chronic end-stage renal disease She was not able to tolerate hemodialysis, no candidate for outpatient hemodialysis per prior admissions She was a started on IV bicarb drip, Lasix on hold. Cr has decreased to 3.4--3.9--4 Palliative care consulted Stop fluids to avoid volume overload.  Started sodium Bicarb.  Continue to hold lasix.   2-Hyperatremia: Change IV fluid to D5. Resolved. Stop fluids to avoid overload.    3-Failure to  Thrive: Treated with IV fluids.  Dysphagia diet., monitor oral intake.  Palliative care consulted. Plan to go back to Nursing home with hospice care.   4-Chronic systolic heart failure: Compensated Stop Fluids.  Monitor.   5-Hyperkalemia: Resolved. Received bicarb drip  Acute on chronic hypoxic respiratory failure;  Continue with Oxygen supplementation increase to 3 L.   Diabetes: Sliding scale insulin.  A. fib: Hold warfarin due to supratherapeutic INR. Received one-time dose of vitamin K. INR trending down today 5.  Hypertension; Continue with Toprol and resume Norvasc.  Recent Diagnosis of Covid January 2021. Repeated covid positive. No ned for isolation.   Anemia of chronic diseases  related to renal failure.  Monitor hb.  Stage 2 buttock pressure injury prior to admission; local care.   Estimated body mass index is 25.16 kg/m as calculated from the following:   Height as of this encounter: 5\' 2"  (1.575 m).   Weight as of this encounter: 62.4 kg.   DVT prophylaxis: Supratherapeutic INR hold anticoagulation Code Status: DNR Family Communication: Son over phone 3-13.  Disposition Plan:  Patient is from: Nursing facility Anticipated d/c date: 2 to 3 days, goals of care discussion, monitor renal function. Barriers to d/c or necessity for inpatient status: SNF in 24 hours monitor respiratory status.   Consultants:   Palliative care  Procedures:   none  Antimicrobials:    Subjective: She is alert, trying to eat breakfast denies dyspnea.  Oxygen 100 % on 3 L.   Objective: Vitals:   09/03/19 0600 09/03/19 0956 09/03/19 1030 09/03/19 1153  BP:  Marland Kitchen)  92/59  98/66  Pulse:  90  99  Resp:  18  20  Temp:  (!) 97.3 F (36.3 C)  (!) 97.2 F (36.2 C)  TempSrc:  Oral  Oral  SpO2:  (!) 75% 100% 100%  Weight: 62.4 kg     Height:        Intake/Output Summary (Last 24 hours) at 09/03/2019 1549 Last data filed at 09/03/2019 1059 Gross per 24 hour  Intake 240 ml   Output --  Net 240 ml   Filed Weights   09/02/19 0001 09/02/19 0746 09/03/19 0600  Weight: 59.3 kg 61.5 kg 62.4 kg    Examination:  General exam: NAD Respiratory system: CTA Cardiovascular system: S 1, S 2 RRR Gastrointestinal system: BS present, soft, nt Central nervous system: alert.  Extremities: no edema    Data Reviewed: I have personally reviewed following labs and imaging studies  CBC: Recent Labs  Lab 08/31/19 2136 09/01/19 0500 09/02/19 0903  WBC 8.8 7.3 8.1  HGB 10.7* 9.2* 10.0*  HCT 32.1* 28.3* 31.3*  MCV 87.7 88.2 88.2  PLT 150 122* 263*   Basic Metabolic Panel: Recent Labs  Lab 08/31/19 2136 09/01/19 0500 09/02/19 0903 09/03/19 0901  NA 146* 146* 143 140  K 5.2* 4.3 4.1 4.9  CL 112* 116* 112* 109  CO2 18* 17* 19* 18*  GLUCOSE 149* 126* 176* 128*  BUN 68* 59* 65* 68*  CREATININE 4.16* 3.49* 3.94* 4.00*  CALCIUM 9.0 7.4* 8.3* 7.9*   GFR: Estimated Creatinine Clearance: 8.4 mL/min (A) (by C-G formula based on SCr of 4 mg/dL (H)). Liver Function Tests: Recent Labs  Lab 08/31/19 2136 09/01/19 0500  AST 34 26  ALT 35 26  ALKPHOS 97 74  BILITOT 1.1 1.0  PROT 6.5 5.1*  ALBUMIN 2.6* 1.9*   No results for input(s): LIPASE, AMYLASE in the last 168 hours. No results for input(s): AMMONIA in the last 168 hours. Coagulation Profile: Recent Labs  Lab 08/31/19 2213 09/01/19 1040 09/02/19 0401 09/03/19 0404  INR 8.4* 7.4* 5.5* 5.2*   Cardiac Enzymes: No results for input(s): CKTOTAL, CKMB, CKMBINDEX, TROPONINI in the last 168 hours. BNP (last 3 results) No results for input(s): PROBNP in the last 8760 hours. HbA1C: Recent Labs    09/01/19 0018  HGBA1C 7.9*   CBG: Recent Labs  Lab 09/02/19 1141 09/02/19 1646 09/02/19 2042 09/03/19 0755 09/03/19 1149  GLUCAP 163* 109* 135* 128* 230*   Lipid Profile: No results for input(s): CHOL, HDL, LDLCALC, TRIG, CHOLHDL, LDLDIRECT in the last 72 hours. Thyroid Function Tests: No results  for input(s): TSH, T4TOTAL, FREET4, T3FREE, THYROIDAB in the last 72 hours. Anemia Panel: No results for input(s): VITAMINB12, FOLATE, FERRITIN, TIBC, IRON, RETICCTPCT in the last 72 hours. Sepsis Labs: No results for input(s): PROCALCITON, LATICACIDVEN in the last 168 hours.  Recent Results (from the past 240 hour(s))  MRSA PCR Screening     Status: None   Collection Time: 09/01/19  9:09 AM   Specimen: Nasopharyngeal Swab  Result Value Ref Range Status   MRSA by PCR NEGATIVE NEGATIVE Final    Comment:        The GeneXpert MRSA Assay (FDA approved for NASAL specimens only), is one component of a comprehensive MRSA colonization surveillance program. It is not intended to diagnose MRSA infection nor to guide or monitor treatment for MRSA infections. Performed at [facility], [place] [address] [place], [place] [address]   SARS CORONAVIRUS 2 (TAT 6-24 HRS)  Nasopharyngeal Nasopharyngeal Swab     Status: Abnormal   Collection Time: 09/01/19  9:51 AM   Specimen: Nasopharyngeal Swab  Result Value Ref Range Status   SARS Coronavirus 2 POSITIVE (A) NEGATIVE Final    Comment: RESULT CALLED TO, READ BACK BY AND VERIFIED WITH: Margarita Grizzle, RN AT 2113 ON 08/22/2019 BY SAINVILUS S (NOTE) SARS-CoV-2 target nucleic acids are DETECTED. The SARS-CoV-2 RNA is generally detectable in upper and lower respiratory specimens during the acute phase of infection. Positive results are indicative of the presence of SARS-CoV-2 RNA. Clinical correlation with patient history and other diagnostic information is  necessary to determine patient infection status. Positive results do not rule out bacterial infection or co-infection with other viruses.  The expected result is Negative. Fact Sheet for Patients: SugarRoll.be Fact Sheet for Healthcare Providers: https://www.woods-mathews.com/ This test is not yet approved or cleared by the [place] FDA  and  has been authorized for detection and/or diagnosis of SARS-CoV-2 by FDA under an Emergency Use Authorization (EUA). This EUA will remain  in effect (meaning this test can be  used) for the duration of the COVID-19 declaration under Section 564(b)(1) of the Act, 21 U.S.C. section 360bbb-3(b)(1), unless the authorization is terminated or revoked sooner. Performed at [facility], [place] [address], [place], [place] [address]          Radiology Studies: No results found.      Scheduled Meds: . amLODipine  2.5 mg Oral Daily  . feeding supplement (GLUCERNA SHAKE)  237 mL Oral Q24H  . hydrOXYzine  10 mg Oral TID  . insulin aspart  0-5 Units Subcutaneous QHS  . insulin aspart  0-6 Units Subcutaneous TID WC  . metoprolol succinate  100 mg Oral Daily  . sodium bicarbonate  650 mg Oral BID   Continuous Infusions:    LOS: 3 days    Time spent: 35 minutes    Nygel Prokop A Kaylah Chiasson, MD [facility]   If 7PM-7AM, please contact night-coverage [email or web address]  09/03/2019, 3:49 PM

## 2019-09-04 LAB — GLUCOSE, CAPILLARY
Glucose-Capillary: 134 mg/dL — ABNORMAL HIGH (ref 70–99)
Glucose-Capillary: 138 mg/dL — ABNORMAL HIGH (ref 70–99)
Glucose-Capillary: 150 mg/dL — ABNORMAL HIGH (ref 70–99)
Glucose-Capillary: 78 mg/dL (ref 70–99)

## 2019-09-04 MED ORDER — SODIUM BICARBONATE 650 MG PO TABS: 650.0000 mg | ORAL_TABLET | Freq: Two times a day (BID) | ORAL | 0 refills | Status: AC

## 2019-09-04 MED ORDER — FUROSEMIDE 20 MG PO TABS: 20.0000 mg | ORAL_TABLET | Freq: Every day | ORAL | 0 refills | Status: AC | PRN

## 2019-09-04 MED ORDER — OXYCODONE HCL 20 MG/ML PO CONC: 5.0000 mg | ORAL | 0 refills | Status: AC | PRN

## 2019-09-04 NOTE — Discharge Summary (Signed)
Physician Discharge Summary  Vernadette Stutsman GEX:[id number] DOB: Nov 14, 1928 DOA: 08/31/2019  PCP: Leonard Downing, MD  Admit date: 08/31/2019 Discharge date: 09/04/2019  Admitted From: SNF Disposition:  SNF with hospice.   Recommendations for Outpatient Follow-up:  Hospice follow up.  Encourage oral intake.  PRN lasix for SOB or increase edema.  Check INR, resume coumadin when INR decrease to therapeutic range.     Discharge Condition: Stable.  CODE STATUS: DNR Diet recommendation: Dysphagia 2 diet   Brief/Interim Summary: 86 year old with past medical history significant for end-stage renal disease not on hemodialysis due to intolerance, A. fib, diabetes, hypertension who was on hospice care residential after admission to the hospital in January.  Patient apparently got better and was discharged from hospice to skilled nursing facility.  She has been in the facility with worsening renal function since her admission in January.  Patient was brought today ED today due to weakness and lethargy and worsening renal function.  She has severe systolic heart failure ejection fraction 30 to 35%.  She has been on Lasix.  She has poor oral intake. Evaluation in the ED respiration 30 pulse 110, white count 8.8, hemoglobin 10, sodium 146 potassium 5.2, BUN 68, creatinine 4.1.  INR 8.  Chest x-ray stable cardiomegaly with possible small bilateral pleural effusion.   1-Acute on chronic end-stage renal disease She was not able to tolerate hemodialysis, no candidate for outpatient hemodialysis per prior admissions She was a started on IV bicarb drip, Lasix on hold. Cr has decreased to 3.4--3.9--4 Palliative care consulted, plan is for SNF with hospice follow up Stop fluids to avoid volume overload.  Started sodium Bicarb.  Continue to hold lasix.  Poor prognosis.   2-Hyperatremia: Change IV fluid to D5. Resolved. Stop fluids to avoid overload.    3-Failure to Thrive: Treated with IV fluids.   Dysphagia diet., monitor oral intake.  Palliative care consulted. Plan to go back to Nursing home with hospice care.   4-Chronic systolic heart failure: Compensated Stop Fluids.  Monitor.  lasix PRN due to poor oral intake and soft BP.   5-Hyperkalemia: Resolved. Received bicarb drip  Acute on chronic hypoxic respiratory failure;  Continue with Oxygen supplementation increase to 2.5 L.  Difficulty measuring oxygen sat, due to poor finger perfusion. Oxygen sat 100 % on 2.5 L.  Lasix PRN , hold on lasix today due to soft BP.   Diabetes: Sliding scale insulin. Had low blood sugar. Avoid insulin. Encourage oral intake.   A. fib: Hold warfarin due to supratherapeutic INR. Received one-time dose of vitamin K. INR trending down today 5.--4 Repeat INR in 24 hours.   Hypertension;BP soft,. Discontinue Norvasc and metoprolol.   Recent Diagnosis of Covid January 2021. Repeated covid positive. No ned for isolation.   Anemia of chronic diseases  related to renal failure.  Monitor hb.  Stage 2 buttock pressure injury prior to admission; local care.   Discharge Diagnoses:  Principal Problem:   Acute on chronic renal failure (HCC) Active Problems:   Permanent atrial fibrillation (HCC)   Essential hypertension, benign   Type II diabetes mellitus ([place])   DM (diabetes mellitus), type 2 with renal complications ([place])   ESRD (end stage renal disease) ([place])   DNR (do not resuscitate) discussion   Failure to thrive in adult   Hyperkalemia   Hypernatremia   Coagulopathy (HCC)   Pressure injury of skin   Palliative care encounter    Discharge Instructions  Discharge Instructions  Diet - low sodium heart healthy   Complete by: As directed    Increase activity slowly   Complete by: As directed      Allergies as of 09/04/2019   No Known Allergies     Medication List    STOP taking these medications   amLODipine 5 MG tablet Commonly known as: NORVASC   hydrOXYzine  10 MG tablet Commonly known as: ATARAX/VISTARIL   metoprolol succinate 100 MG 24 hr tablet Commonly known as: TOPROL-XL   warfarin 4 MG tablet Commonly known as: COUMADIN     TAKE these medications   Accu-Chek Aviva Plus test strip Generic drug: glucose blood TO CHECK BLOOD GLUCOSE ONCE DAILY **DX CODE: E11.9**   Accu-Chek Aviva Plus w/Device Kit TO CHECK BLOOD GLUCOSE ONCE DAILY **DX CODE: E11.9**   Accu-Chek Softclix Lancets lancets USE TO CHECK BLOOD GLUCOSE ONCE DAILY **DX CODE: E11.9**   acetaminophen 500 MG tablet Commonly known as: TYLENOL Take 500 mg by mouth every 6 (six) hours as needed for mild pain.   BIOFREEZE EX Apply 1 application topically See admin instructions. apply to entire back qam   feeding supplement (GLUCERNA SHAKE) Liqd Take 237 mLs by mouth daily.   furosemide 20 MG tablet Commonly known as: LASIX Take 1 tablet (20 mg total) by mouth daily as needed. PRN for worsening edema. What changed:   when to take this  reasons to take this  additional instructions  Another medication with the same name was removed. Continue taking this medication, and follow the directions you see here.   LORazepam 1 MG tablet Commonly known as: ATIVAN Take 1 mg by mouth every 4 (four) hours as needed for anxiety.   lovastatin 40 MG tablet Commonly known as: MEVACOR Take 1 tablet (40 mg total) by mouth at bedtime.   ondansetron 4 MG tablet Commonly known as: ZOFRAN Take 4 mg by mouth every 8 (eight) hours as needed for nausea or vomiting.   oxyCODONE 20 MG/ML concentrated solution Commonly known as: ROXICODONE INTENSOL Take 0.3 mLs (6 mg total) by mouth every 4 (four) hours as needed for up to 3 days for severe pain. What changed: how much to take   polyethylene glycol 17 g packet Commonly known as: MIRALAX / GLYCOLAX Take 17 g by mouth daily as needed for mild constipation.   sennosides-docusate sodium 8.6-50 MG tablet Commonly known as:  SENOKOT-S Take 2 tablets by mouth daily as needed for constipation.   sodium bicarbonate 650 MG tablet Take 1 tablet (650 mg total) by mouth 2 (two) times daily.       No Known Allergies  Consultations: Palliative   Procedures/Studies: DG Chest Port 1 View  Result Date: 08/31/2019 CLINICAL DATA:  85 year old female with weakness. EXAM: PORTABLE CHEST 1 VIEW COMPARISON:  Chest radiograph dated 07/05/2019. FINDINGS: There is stable cardiomegaly. Bibasilar hazy densities may represent atelectasis or small pleural effusions. No vascular congestion or edema. No focal consolidation or pneumothorax. Atherosclerotic calcification of the aorta. No acute osseous pathology. IMPRESSION: Stable cardiomegaly with possible small bilateral pleural effusions. No focal consolidation. Electronically Signed   By: Anner Crete M.D.   On: 08/31/2019 21:51     Subjective: Alert, denies dyspnea. Asking for her glasses.   Oxygen sat difficult to pick up due to poor finger perfusion. After warming finger sat 100 % on 2 L.   Discharge Exam: Vitals:   09/04/19 0938 09/04/19 1047  BP: 98/68   Pulse: 86 85  Resp: 16  Temp: 97.7 F (36.5 C)   SpO2:  100%     General: Pt is alert, awake, not in acute distress Cardiovascular: RRR, S1/S2 +, no rubs, no gallops Respiratory: CTA bilaterally, no wheezing, no rhonchi Abdominal: Soft, NT, ND, bowel sounds + Extremities: no edema, no cyanosis    The results of significant diagnostics from this hospitalization (including imaging, microbiology, ancillary and laboratory) are listed below for reference.     Microbiology: Recent Results (from the past 240 hour(s))  MRSA PCR Screening     Status: None   Collection Time: 09/01/19  9:09 AM   Specimen: Nasopharyngeal Swab  Result Value Ref Range Status   MRSA by PCR NEGATIVE NEGATIVE Final    Comment:        The GeneXpert MRSA Assay (FDA approved for NASAL specimens only), is one component of  a comprehensive MRSA colonization surveillance program. It is not intended to diagnose MRSA infection nor to guide or monitor treatment for MRSA infections. Performed at [facility], [place] [address] [place], [place] [address]   SARS CORONAVIRUS 2 (TAT 6-24 HRS) Nasopharyngeal Nasopharyngeal Swab     Status: Abnormal   Collection Time: 09/01/19  9:51 AM   Specimen: Nasopharyngeal Swab  Result Value Ref Range Status   SARS Coronavirus 2 POSITIVE (A) NEGATIVE Final    Comment: RESULT CALLED TO, READ BACK BY AND VERIFIED WITH: Margarita Grizzle, RN AT 2113 ON 08/22/2019 BY SAINVILUS S (NOTE) SARS-CoV-2 target nucleic acids are DETECTED. The SARS-CoV-2 RNA is generally detectable in upper and lower respiratory specimens during the acute phase of infection. Positive results are indicative of the presence of SARS-CoV-2 RNA. Clinical correlation with patient history and other diagnostic information is  necessary to determine patient infection status. Positive results do not rule out bacterial infection or co-infection with other viruses.  The expected result is Negative. Fact Sheet for Patients: SugarRoll.be Fact Sheet for Healthcare Providers: https://www.woods-mathews.com/ This test is not yet approved or cleared by the [place] FDA and  has been authorized for detection and/or diagnosis of SARS-CoV-2 by FDA under an Emergency Use Authorization (EUA). This EUA will remain  in effect (meaning this test can be  used) for the duration of the COVID-19 declaration under Section 564(b)(1) of the Act, 21 U.S.C. section 360bbb-3(b)(1), unless the authorization is terminated or revoked sooner. Performed at [facility], [place] [address], [place], [place] [address]      Labs: BNP (last 3 results) No results for input(s): BNP in the last 8760 hours. Basic Metabolic Panel: Recent Labs  Lab 08/31/19 2136 09/01/19 0500  09/02/19 0903 09/03/19 0901  NA 146* 146* 143 140  K 5.2* 4.3 4.1 4.9  CL 112* 116* 112* 109  CO2 18* 17* 19* 18*  GLUCOSE 149* 126* 176* 128*  BUN 68* 59* 65* 68*  CREATININE 4.16* 3.49* 3.94* 4.00*  CALCIUM 9.0 7.4* 8.3* 7.9*   Liver Function Tests: Recent Labs  Lab 08/31/19 2136 09/01/19 0500  AST 34 26  ALT 35 26  ALKPHOS 97 74  BILITOT 1.1 1.0  PROT 6.5 5.1*  ALBUMIN 2.6* 1.9*   No results for input(s): LIPASE, AMYLASE in the last 168 hours. No results for input(s): AMMONIA in the last 168 hours. CBC: Recent Labs  Lab 08/31/19 2136 09/01/19 0500 09/02/19 0903  WBC 8.8 7.3 8.1  HGB 10.7* 9.2* 10.0*  HCT 32.1* 28.3* 31.3*  MCV 87.7 88.2 88.2  PLT 150 122* 128*   Cardiac  Enzymes: No results for input(s): CKTOTAL, CKMB, CKMBINDEX, TROPONINI in the last 168 hours. BNP: Invalid input(s): POCBNP CBG: Recent Labs  Lab 09/03/19 1629 09/03/19 2309 09/03/19 2341 09/04/19 0003 09/04/19 0740  GLUCAP 145* 45* 61* 78 134*   D-Dimer No results for input(s): DDIMER in the last 72 hours. Hgb A1c No results for input(s): HGBA1C in the last 72 hours. Lipid Profile No results for input(s): CHOL, HDL, LDLCALC, TRIG, CHOLHDL, LDLDIRECT in the last 72 hours. Thyroid function studies No results for input(s): TSH, T4TOTAL, T3FREE, THYROIDAB in the last 72 hours.  Invalid input(s): FREET3 Anemia work up No results for input(s): VITAMINB12, FOLATE, FERRITIN, TIBC, IRON, RETICCTPCT in the last 72 hours. Urinalysis    Component Value Date/Time   COLORURINE YELLOW 07/05/2019 2221   APPEARANCEUR HAZY (A) 07/05/2019 2221   LABSPEC 1.024 07/05/2019 2221   PHURINE 5.0 07/05/2019 2221   GLUCOSEU NEGATIVE 07/05/2019 2221   HGBUR SMALL (A) 07/05/2019 2221   BILIRUBINUR NEGATIVE 07/05/2019 2221   KETONESUR NEGATIVE 07/05/2019 2221   PROTEINUR >=300 (A) 07/05/2019 2221   NITRITE NEGATIVE 07/05/2019 2221   LEUKOCYTESUR NEGATIVE 07/05/2019 2221   Sepsis Labs Invalid  input(s): PROCALCITONIN,  WBC,  LACTICIDVEN Microbiology Recent Results (from the past 240 hour(s))  MRSA PCR Screening     Status: None   Collection Time: 09/01/19  9:09 AM   Specimen: Nasopharyngeal Swab  Result Value Ref Range Status   MRSA by PCR NEGATIVE NEGATIVE Final    Comment:        The GeneXpert MRSA Assay (FDA approved for NASAL specimens only), is one component of a comprehensive MRSA colonization surveillance program. It is not intended to diagnose MRSA infection nor to guide or monitor treatment for MRSA infections. Performed at [facility], [place] [address], [place], [place] [address]   SARS CORONAVIRUS 2 (TAT 6-24 HRS) Nasopharyngeal Nasopharyngeal Swab     Status: Abnormal   Collection Time: 09/01/19  9:51 AM   Specimen: Nasopharyngeal Swab  Result Value Ref Range Status   SARS Coronavirus 2 POSITIVE (A) NEGATIVE Final    Comment: RESULT CALLED TO, READ BACK BY AND VERIFIED WITH: Margarita Grizzle, RN AT 2113 ON 08/22/2019 BY SAINVILUS S (NOTE) SARS-CoV-2 target nucleic acids are DETECTED. The SARS-CoV-2 RNA is generally detectable in upper and lower respiratory specimens during the acute phase of infection. Positive results are indicative of the presence of SARS-CoV-2 RNA. Clinical correlation with patient history and other diagnostic information is  necessary to determine patient infection status. Positive results do not rule out bacterial infection or co-infection with other viruses.  The expected result is Negative. Fact Sheet for Patients: SugarRoll.be Fact Sheet for Healthcare Providers: https://www.woods-mathews.com/ This test is not yet approved or cleared by the [place] FDA and  has been authorized for detection and/or diagnosis of SARS-CoV-2 by FDA under an Emergency Use Authorization (EUA). This EUA will remain  in effect (meaning this test can be  used) for the duration of the COVID-19  declaration under Section 564(b)(1) of the Act, 21 U.S.C. section 360bbb-3(b)(1), unless the authorization is terminated or revoked sooner. Performed at [facility], [place] [address], [place], [place] [address]      Time coordinating discharge: 40 minutes  SIGNED:   Elmarie Shiley, MD  [facility]

## 2019-09-04 NOTE — Progress Notes (Signed)
Blood sugar was 45 @ 2309. 8oz orange juice with 2 sugar packets consumed. CBG was then 61. Another 8 oz orange juice with sugar packets was consumed. CBG up to 78 @ 0003. Pt now sleeping, will continue to monitor.

## 2019-09-04 NOTE — Progress Notes (Signed)
Report called to [facility], Therapist, sports at [place].  Pt stable awaiting transport.

## 2019-09-04 NOTE — TOC Transition Note (Signed)
Transition of Care [facility]) - CM/SW Discharge Note   Patient Details  Name: Patricia Randall MRN: [id number] Date of Birth: Nov 17, 1928  [facility] CM/SW Contact:  Ross Ludwig, LCSW Phone Number: 09/04/2019, 4:56 PM   Clinical Narrative:     Patient to be d/c'ed today to [facility] room 207.  Patient and family agreeable to plans will transport via ems RN to call report [phone].  Patient's son is aware that patient is discharging today.     Final next level of care: [place] Barriers to Discharge: Barriers Resolved   Patient Goals and CMS Choice Patient states their goals for this hospitalization and ongoing recovery are:: To return back to [facility].gov Compare Post Acute Care list provided to:: Patient Choice offered to / list presented to : Adult Children  Discharge Placement   Existing PASRR number confirmed : 09/04/19          Patient chooses bed at: [facility] Patient to be transferred to facility by: [facility] Name of family member notified: Patient's son [name] [phone] Patient and family notified of of transfer: 09/04/19  Discharge Plan and Services                                     Social Determinants of Health (SDOH) Interventions     Readmission Risk Interventions No flowsheet data found.

## 2019-09-04 NOTE — NC FL2 (Signed)
[place] LEVEL OF CARE SCREENING TOOL     IDENTIFICATION  Patient Name: Patricia Randall Birthdate: Nov 29, 1928 Sex: female Admission Date (Current Location): 08/31/2019  [facility] and [place] Number:  Herbalist and Address:  [facility],  [place], [place]      Provider Number: [id number]  Attending Physician Name and Address:  Elmarie Shiley, MD  Relative Name and Phone Number:  Tarah, Buboltz [phone]  [phone]    Current Level of Care: Hospital Recommended Level of Care: [place] Prior Approval Number:    Date Approved/Denied:   PASRR Number: [id number]  Discharge Plan: SNF    Current Diagnoses: Patient Active Problem List   Diagnosis Date Noted  . Pressure injury of skin 09/02/2019  . Palliative care encounter   . Acute on chronic renal failure ([place]) 08/31/2019  . Hyperkalemia 08/31/2019  . Hypernatremia 08/31/2019  . Coagulopathy ([place]) 08/31/2019  . Palliative care by specialist   . DNR (do not resuscitate) discussion   . DNR (do not resuscitate)   . Goals of care, counseling/discussion   . Muscular weakness   . Failure to thrive in adult   . ESRD (end stage renal disease) ([place]) 07/11/2019  . COVID-19 virus infection 07/05/2019  . DM (diabetes mellitus), type 2 with renal complications ([place]) 58/02/9832  . AKI (acute kidney injury) ([place]) 07/05/2019  . Limb ischemia   . CHF exacerbation ([place]) 11/20/2017  . Type II diabetes mellitus ([place]) 11/13/2017  . Ulcer of right lower extremity ([place]) 11/13/2017  . Acute on chronic systolic CHF (congestive heart failure) ([place]) 09/26/2017  . Protein-calorie malnutrition, severe 07/05/2015  . CKD (chronic kidney disease), stage III ([place])   . Essential hypertension, benign 03/13/2014  . Cardiomyopathy ([place]) 04/01/2013  . Permanent atrial fibrillation ([place]) 03/31/2013    Orientation RESPIRATION BLADDER Height & Weight     Self, Place  O2(2.5  L) Continent Weight: 139 lb 12.4 oz (63.4 kg) Height:  5\' 2"  (157.5 cm)  BEHAVIORAL SYMPTOMS/MOOD NEUROLOGICAL BOWEL NUTRITION STATUS      Continent Diet(Low Sodium Heart Healthy)  AMBULATORY STATUS COMMUNICATION OF NEEDS Skin   Limited Assist Verbally PU Stage and Appropriate Care   PU Stage 2 Dressing: (PRN dressing changes)                   Personal Care Assistance Level of Assistance  Bathing, Feeding, Dressing Bathing Assistance: Limited assistance Feeding assistance: Limited assistance Dressing Assistance: Limited assistance     Functional Limitations Info  Sight, Speech, Hearing Sight Info: Adequate Hearing Info: Adequate Speech Info: Adequate    SPECIAL CARE FACTORS FREQUENCY                       Contractures Contractures Info: Not present    Additional Factors Info  Code Status, Allergies, Insulin Sliding Scale Code Status Info: DNR Allergies Info: NKA   Insulin Sliding Scale Info: insulin aspart (novoLOG) injection 0-6 Units 3x a day with meals       Current Medications (09/04/2019):  This is the current hospital active medication list Current Facility-Administered Medications  Medication Dose Route Frequency Provider Last Rate Last Admin  . acetaminophen (TYLENOL) tablet 650 mg  650 mg Oral Q6H PRN Elwyn Reach, MD       Or  . acetaminophen (TYLENOL) suppository 650 mg  650 mg Rectal Q6H PRN Elwyn Reach, MD      . calcium  carbonate (dosed in mg elemental calcium) suspension 500 mg of elemental calcium  500 mg of elemental calcium Oral Q6H PRN Elwyn Reach, MD      . camphor-menthol (SARNA) lotion 1 application  1 application Topical C6C PRN Elwyn Reach, MD       And  . hydrOXYzine (ATARAX/VISTARIL) tablet 25 mg  25 mg Oral Q8H PRN Gala Romney L, MD      . docusate sodium (ENEMEEZ) enema 283 mg  1 enema Rectal PRN Elwyn Reach, MD      . feeding supplement (GLUCERNA SHAKE) (GLUCERNA SHAKE) liquid 237 mL  237 mL Oral  Q24H Garba, Mohammad L, MD   237 mL at 09/04/19 1045  . feeding supplement (NEPRO CARB STEADY) liquid 237 mL  237 mL Oral TID PRN Gala Romney L, MD      . insulin aspart (novoLOG) injection 0-5 Units  0-5 Units Subcutaneous QHS Garba, Mohammad L, MD      . insulin aspart (novoLOG) injection 0-6 Units  0-6 Units Subcutaneous TID WC Elwyn Reach, MD   1 Units at 09/03/19 1715  . LORazepam (ATIVAN) tablet 1 mg  1 mg Oral Q4H PRN Gala Romney L, MD      . ondansetron (ZOFRAN) tablet 4 mg  4 mg Oral Q6H PRN Elwyn Reach, MD       Or  . ondansetron (ZOFRAN) injection 4 mg  4 mg Intravenous Q6H PRN Gala Romney L, MD      . oxyCODONE (Oxy IR/ROXICODONE) immediate release tablet 5 mg  5 mg Oral Q4H PRN Elwyn Reach, MD   5 mg at 09/01/19 2041  . polyethylene glycol (MIRALAX / GLYCOLAX) packet 17 g  17 g Oral Daily PRN Gala Romney L, MD      . senna-docusate (Senokot-S) tablet 2 tablet  2 tablet Oral Daily PRN Gala Romney L, MD      . sodium bicarbonate tablet 650 mg  650 mg Oral BID Regalado, Belkys A, MD   650 mg at 09/04/19 1045  . sorbitol 70 % solution 30 mL  30 mL Oral PRN Gala Romney L, MD      . zolpidem (AMBIEN) tablet 5 mg  5 mg Oral QHS PRN Elwyn Reach, MD         Discharge Medications: Please see discharge summary for a list of discharge medications.  Relevant Imaging Results:  Relevant Lab Results:   Additional Information SSN [id number]  Ross Ludwig, LCSW

## 2019-10-10 IMAGING — CR DG CHEST 2V
2 series · 2 of 2 positions shown · non-contrast
Comparison: 09/26/2017

CLINICAL DATA: Shortness of breath for 2-3 weeks.

EXAM:
CHEST - 2 VIEW

[chest pa]
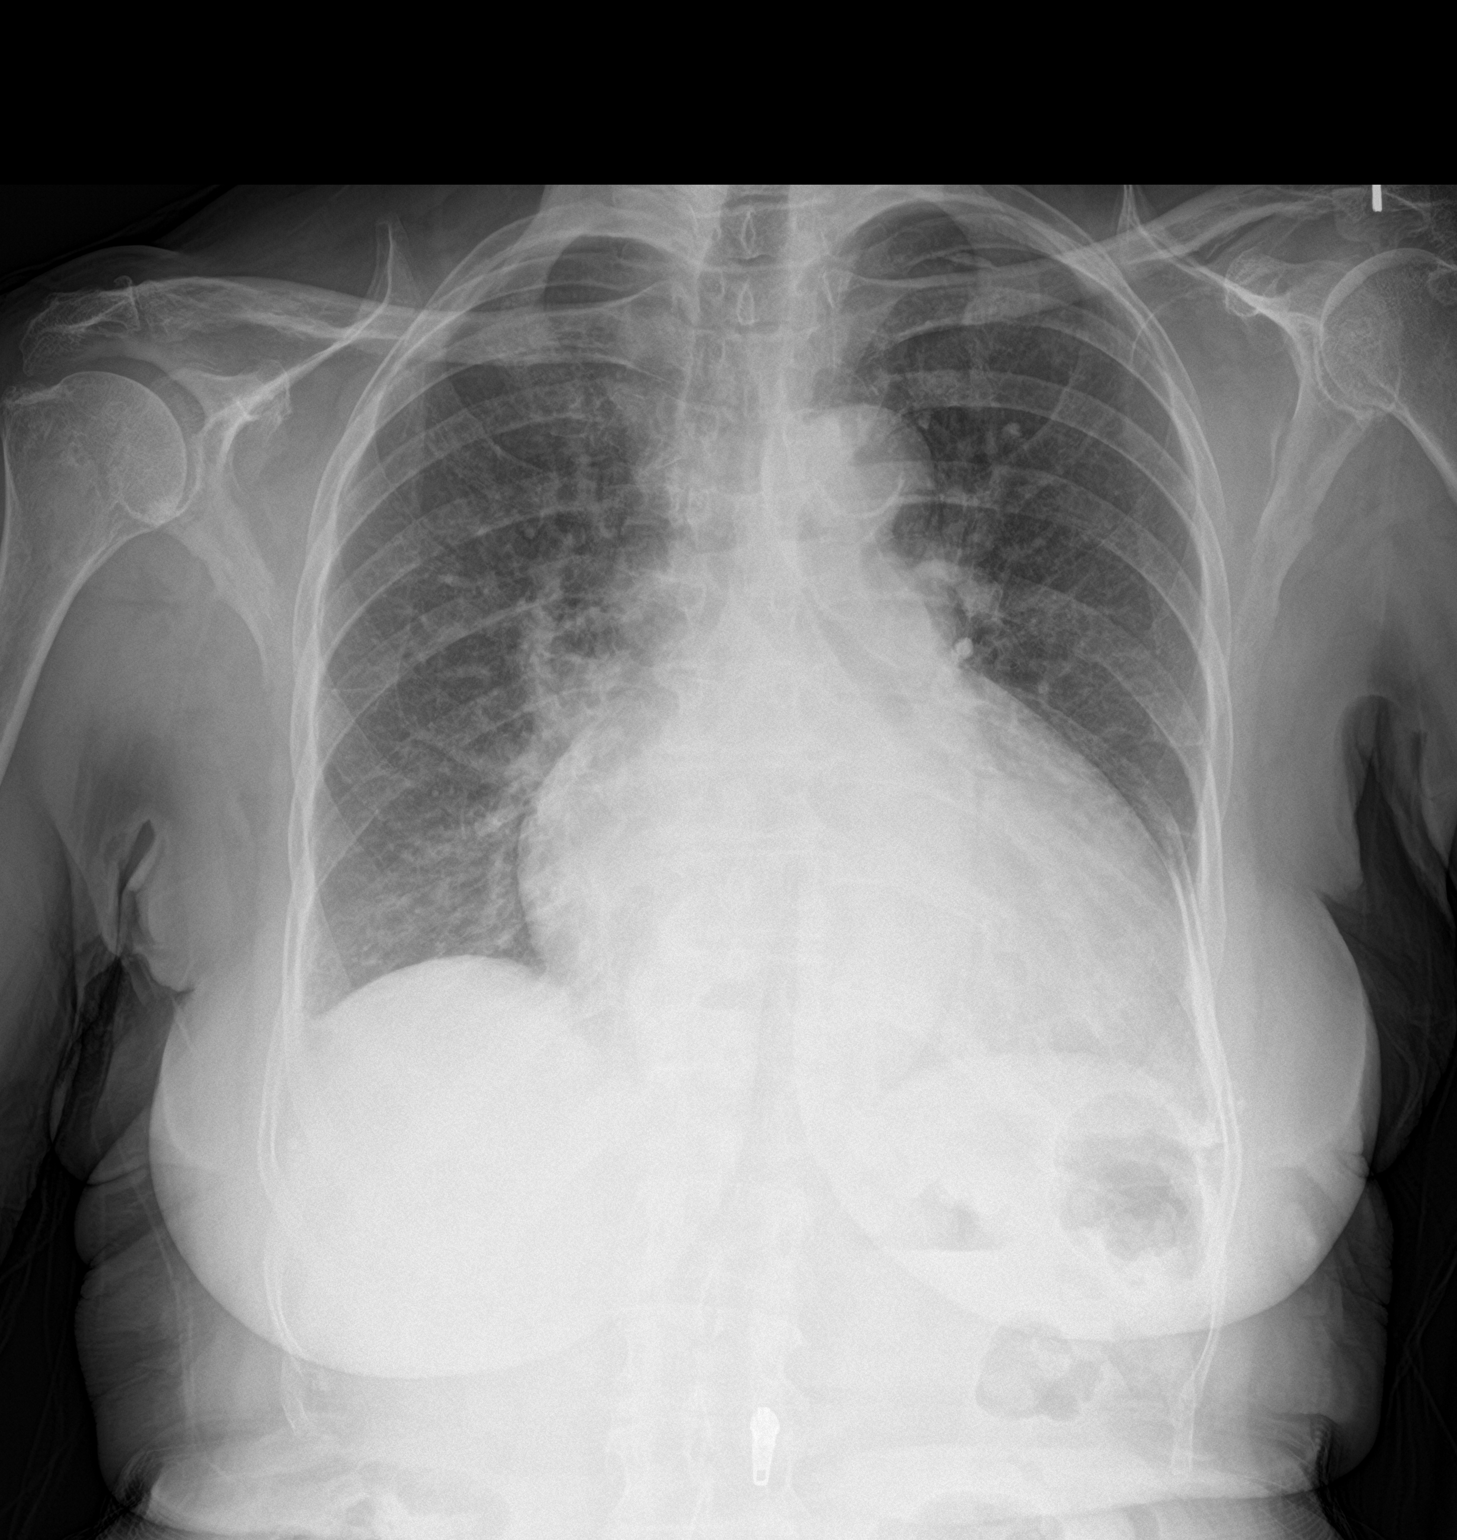

[chest lat]
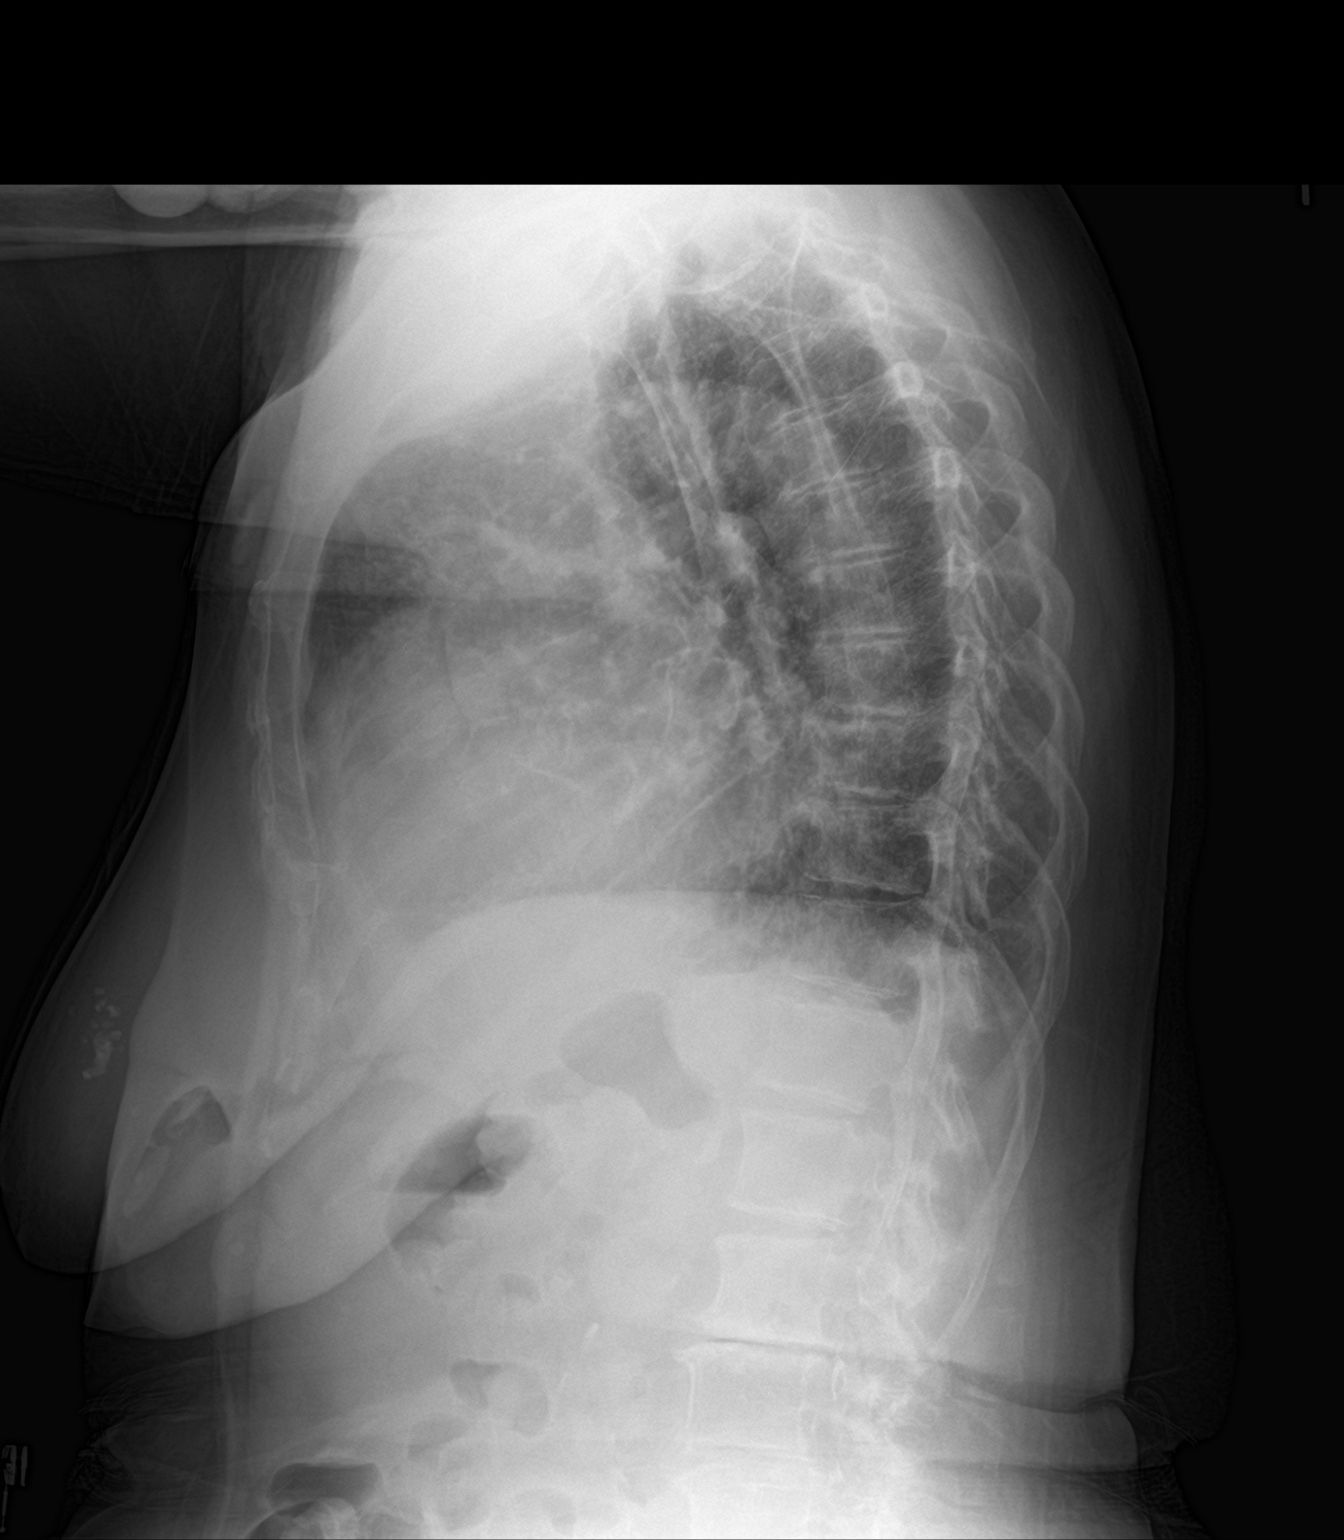

[2 of 2 positions shown; findings below may reference images not displayed]

FINDINGS: Enlarged cardiac silhouette, not significantly changed. Mediastinal
contours appear intact. Calcific atherosclerotic disease of the
aorta.

There is no evidence of focal airspace consolidation, pleural
effusion or pneumothorax. Interstitial pulmonary edema.

Osseous structures are without acute abnormality. Soft tissues are
grossly normal.
IMPRESSION: Enlarged cardiac silhouette which may be due to congestive heart
failure or pericardial effusion.

Interstitial pulmonary edema.

Calcific atherosclerotic disease and tortuosity of the aorta.

## 2019-10-19 ENCOUNTER — Ambulatory Visit: Payer: Medicare Other | Admitting: Cardiology

## 2019-10-20 DEATH — deceased

## 2022-01-18 DIAGNOSIS — H34811 Central retinal vein occlusion, right eye, with macular edema: Secondary | ICD-10-CM | POA: Insufficient documentation

## 2022-09-10 ENCOUNTER — Encounter (INDEPENDENT_AMBULATORY_CARE_PROVIDER_SITE_OTHER): Payer: Self-pay

## 2022-09-12 ENCOUNTER — Other Ambulatory Visit (INDEPENDENT_AMBULATORY_CARE_PROVIDER_SITE_OTHER): Payer: Medicare Other

## 2022-09-12 ENCOUNTER — Ambulatory Visit (INDEPENDENT_AMBULATORY_CARE_PROVIDER_SITE_OTHER): Payer: Medicare Other | Admitting: Family Medicine

## 2022-09-12 VITALS — BP 165/92 | HR 73 | Temp 97.0°F | Resp 16

## 2022-09-12 DIAGNOSIS — R059 Cough, unspecified: Secondary | ICD-10-CM

## 2022-09-12 DIAGNOSIS — R051 Acute cough: Secondary | ICD-10-CM

## 2022-09-12 MED ORDER — ESCITALOPRAM OXALATE 20 MG OR TABS
1.00 | ORAL_TABLET | Freq: Every day | ORAL | Status: AC
Start: 2022-08-03 — End: ?

## 2022-09-12 MED ORDER — ALBUTEROL SULFATE 108 (90 BASE) MCG/ACT IN AERS
2.0000 | INHALATION_SPRAY | RESPIRATORY_TRACT | 0 refills | Status: DC | PRN
Start: 2022-09-12 — End: 2022-11-11

## 2022-09-12 MED ORDER — VENLAFAXINE HCL 37.5 MG OR TABS: 37.50 mg | ORAL_TABLET | Freq: Every day | ORAL | Status: AC

## 2022-09-12 MED ORDER — MULTIVITAMINS OR CAPS: 1.00 | ORAL_CAPSULE | Freq: Every day | ORAL | Status: AC

## 2022-09-12 MED ORDER — POLYETHYLENE GLYCOL 3350 OR POWD: 17.00 g | Freq: Every day | ORAL | Status: AC

## 2022-09-12 MED ORDER — ATORVASTATIN CALCIUM 20 MG OR TABS
1.00 | ORAL_TABLET | Freq: Every day | ORAL | Status: DC
Start: 2022-04-02 — End: 2023-02-11

## 2022-09-12 MED ORDER — PREDNISONE 20 MG OR TABS
40.0000 mg | ORAL_TABLET | Freq: Every day | ORAL | 0 refills | Status: AC
Start: 2022-09-12 — End: 2022-09-17

## 2022-09-12 MED ORDER — VALSARTAN 80 MG OR TABS
1.00 | ORAL_TABLET | Freq: Every day | ORAL | Status: DC
Start: 2022-09-10 — End: 2023-02-11

## 2022-09-12 MED ORDER — BENZONATATE 100 MG OR CAPS
100.0000 mg | ORAL_CAPSULE | Freq: Three times a day (TID) | ORAL | 0 refills | Status: AC | PRN
Start: 2022-09-12 — End: 2022-09-19

## 2022-09-12 MED ORDER — MEMANTINE HCL ER 14 MG PO CP24
1.00 | ORAL_CAPSULE | Freq: Every day | ORAL | Status: DC
Start: 2022-08-03 — End: 2023-01-20

## 2022-09-12 MED ORDER — SPIRONOLACTONE 25 MG OR TABS
1.00 | ORAL_TABLET | Freq: Every day | ORAL | Status: DC
Start: 2022-09-10 — End: 2022-11-11

## 2022-09-12 MED ORDER — ALBUTEROL SULFATE 108 (90 BASE) MCG/ACT IN AERS
1.00 | INHALATION_SPRAY | RESPIRATORY_TRACT | Status: DC
Start: 2021-11-18 — End: 2022-09-12

## 2022-09-12 NOTE — Progress Notes (Signed)
HPI- cough for 4 days. Associated symptoms: rhinitis, mild dyspnea, wheeze, sputum production. Treatments:  OTC cold meds. Sick contacts:  Daughter with similar (resolving). Relevant PMH (e.g. pulmonary disease):  Asthma/bronchitis, dementia. Relevant FH (e.g. pulmonary disease):  Daughter with asthma.  Daughter is present and assisting with the history.     VITALS- BP (!) 165/92   Pulse 73   Temp 97 F (36.1 C) (Temporal)   Resp 16   SpO2 97%      PHYSICAL EXAM-  General: alert, no distress, mild ill appearance  HEENT: no conjunctivitis, normal TMs, mild clear/white rhinitis, trachea midline, mild oropharyngeal erythema/cobblestoning, MMM, no lymphadenopathy  Chest: regular rhythm, no increased WOB, cough, diffuse bilateral expiratory wheeze  Extremities: dry, well-perfused, no edema     RESULTS-   Viral testing:  Declines  Chest X-rays:  No acute cardiopulmonary process     ASSESSMENT/PLAN- most likely viral upper respiratory infection and acute bronchitis/asthma  - albuterol 2 puffs every 4 hours for 1-2 days then every 4 hours as needed  - start prednisone 40 mg daily if asthma symptoms not improving with above  - pain/fever options: acetaminophen.  - congestion options: nasal fluticasone, guaifenesin, antihistamines, nasal irrigation  - cough options: benzonatate Rx, dextromethorphan, honey, lozenges, smoke avoidance  - primary care provider or urgent care if not improving, new fever/pain/concern  - emergency room if acutely worse with respiratory distress, dehydration, fainting, or other acute concerns    Guillermina [place], MD

## 2022-09-12 NOTE — Patient Instructions (Addendum)
Chest X-rays:  No acute cardiopulmonary process     ASSESSMENT/PLAN- most likely viral upper respiratory infection and acute bronchitis/asthma  - albuterol 2 puffs every 4 hours for 1-2 days then every 4 hours as needed  - start prednisone 40 mg daily if asthma symptoms not improving with above  - pain/fever options: acetaminophen.  - congestion options: nasal fluticasone, guaifenesin, antihistamines, nasal irrigation  - cough options: benzonatate Rx, dextromethorphan, honey, lozenges, smoke avoidance  - primary care provider or urgent care if not improving, new fever/pain/concern  - emergency room if acutely worse with respiratory distress, dehydration, fainting, or other acute concerns     Guillermina [place], MD

## 2022-11-10 ENCOUNTER — Ambulatory Visit (INDEPENDENT_AMBULATORY_CARE_PROVIDER_SITE_OTHER): Payer: PPO | Admitting: Student in an Organized Health Care Education/Training Program

## 2022-11-10 ENCOUNTER — Encounter (INDEPENDENT_AMBULATORY_CARE_PROVIDER_SITE_OTHER): Payer: Self-pay | Admitting: Student in an Organized Health Care Education/Training Program

## 2022-11-10 ENCOUNTER — Encounter (HOSPITAL_BASED_OUTPATIENT_CLINIC_OR_DEPARTMENT_OTHER): Payer: Self-pay | Admitting: Student in an Organized Health Care Education/Training Program

## 2022-11-10 VITALS — BP 125/80 | HR 78 | Temp 97.1°F | Resp 18 | Ht 63.0 in | Wt 197.2 lb

## 2022-11-10 DIAGNOSIS — H348112 Central retinal vein occlusion, right eye, stable: Secondary | ICD-10-CM

## 2022-11-10 DIAGNOSIS — H1133 Conjunctival hemorrhage, bilateral: Secondary | ICD-10-CM

## 2022-11-10 DIAGNOSIS — M810 Age-related osteoporosis without current pathological fracture: Secondary | ICD-10-CM

## 2022-11-10 DIAGNOSIS — F0390 Unspecified dementia without behavioral disturbance: Secondary | ICD-10-CM

## 2022-11-10 MED ORDER — ZOLEDRONIC ACID 5 MG/100ML IV SOLN
5.00 mg | INTRAVENOUS | Status: AC
Start: 2022-03-17 — End: 2023-03-15

## 2022-11-10 MED ORDER — SENNA 8.6 MG OR TABS
1.00 | ORAL_TABLET | Freq: Every day | ORAL | Status: DC
Start: ? — End: 2022-11-11

## 2022-11-10 MED ORDER — QVAR REDIHALER 80 MCG/ACT IN AERB
1.00 | INHALATION_SPRAY | RESPIRATORY_TRACT | Status: DC
Start: 2022-07-08 — End: 2022-11-11

## 2022-11-10 MED ORDER — NORMAL SALINE FLUSH 0.9 % IJ SOLN
2.00 mL | INTRAVENOUS | Status: DC
Start: 2021-01-20 — End: 2022-11-11

## 2022-11-10 MED ORDER — LEVOFLOXACIN 500 MG OR TABS
ORAL_TABLET | ORAL | Status: DC
Start: 2022-09-17 — End: 2022-11-11

## 2022-11-10 MED ORDER — SODIUM CHLORIDE 0.9 % IV SOLN
100.00 mL | INTRAVENOUS | Status: DC
Start: 2021-01-20 — End: 2022-11-11

## 2022-11-10 NOTE — Patient Instructions (Addendum)
Vaccines you need:  2nd dose of 2023-2024 COVID vaccine  Shingrix (two doses)  Tdap (tetanus booster)    Please call to schedule Reclast and with Ophthalmology

## 2022-11-10 NOTE — Progress Notes (Signed)
GERIATRIC MEDICINE HISTORY AND PHYSICAL      CC: Establish Care (New Patient, Establish Care)      HPI: Angela Warner is a 89 year old female who establishes care from [facility]. The prior PCP was a geriatrician, Dr. Rhett Bannister. Two daughters, Angela Warner and Angela Warner, accompany the patient today. Angela Warner lives with Angela Warner in [place]. She had lived with Angela Warner and Angela Warner's husband in [place] for 5 years.    Daughters provided most history because of dementia. The patient started prednisone 40 mg following an [facility] visit on 09/12/2022 for bronchitis. Prednisone worsened hallucinations, sleep, and mood. She presented to the [facility] ED on 09/17/2022 after being found down. She was treated for pneumonia with levofloxacin. Hallucinations are subsiding gradually. She has hallucinations at baseline of seeing others in the room and bugs in food.     Spironolactone was stopped by family out of concern for DDI.    Angela Prothro has a bed alarm and chair lift at her residence.    She is overdue for zoledronic acid. Prior doses were administered on 04/15/2021 and 02/14/2020.    She has poor vision OD due to central retinal vein occlusion. Vision did not recover with Avastin. She tends to rub the eyes in the morning. She denies ocular pain.      Review of Systems   Unable to perform ROS: Dementia   Eyes:  Positive for blurred vision and redness. Negative for pain.           11/10/2022     2:54 PM   Liborio Negron Torres PHQ9 DEPRESSION QUESTIONNAIRE   Interest 0   Depressed 0       Function and geriatric syndromes  Myrtis Ser ADLs: dependent    [place] IADLs: dependent      Fall risk: elevated    Hearing impairment: present    Cognitive impairment: present    PAST MEDICAL HISTORY:  Patient Active Problem List    Diagnosis Date Noted    Major neurocognitive disorder (CMS-HCC) 11/11/2022    Osteoporosis, unspecified osteoporosis type, unspecified pathological fracture presence 11/10/2022    Central retinal vein occlusion with macular edema of right eye  01/18/2022     No past surgical history on file.  No family history on file.  Social History     Tobacco Use    Smoking status: Never     Passive exposure: Never    Smokeless tobacco: Never   Substance Use Topics    Alcohol use: Not Currently       ALLERGIES: is allergic to cephalosporins, gluten meal, iodine, lactose, lac bovis, and shellfish allergy.    MEDICATIONS:  Current Outpatient Medications   Medication Sig    atorvastatin (LIPITOR) 20 MG tablet Take 1 tablet (20 mg) by mouth daily.    escitalopram (LEXAPRO) 20 MG tablet Take 1 tablet (20 mg) by mouth daily.    memantine (NAMENDA XR) 14 MG ER capsule Take 1 capsule by mouth daily.    Multiple Vitamin (MULTIVITAMIN) capsule Take 1 capsule by mouth daily.    polyethylene glycol (GLYCOLAX) 17 GM/SCOOP powder Take 17 g by mouth.    valsartan (DIOVAN) 80 MG tablet Take 1 tablet (80 mg) by mouth daily.    venlafaxine (EFFEXOR) 37.5 MG tablet Take 1 tablet (37.5 mg) by mouth daily.    zoledronic acid (RECLAST) 5 MG/100ML injection Inject 100 mL (5 mg) into vein.     No current facility-administered medications for this visit.  PHYSICAL EXAMINATION:   height is 5\' 3"  (1.6 m) and weight is 89.4 kg (197 lb 3.2 oz). Her temporal temperature is 97.1 F (36.2 C). Her blood pressure is 125/80 and her pulse is 78. Her respiration is 18 and oxygen saturation is 96%.       11/10/2022     2:43 PM   Date Weight Recorded   Metric 89.449 kg   Pounds/Ounces 197 lb 3.2 oz      Physical Exam  Constitutional:       General: She is not in acute distress.  HENT:      Head: Normocephalic and atraumatic.   Eyes:      Conjunctiva/sclera:      Right eye: Hemorrhage present.      Left eye: Hemorrhage present.   Cardiovascular:      Rate and Rhythm: Normal rate and regular rhythm.      Heart sounds: No murmur heard.     No friction rub. No gallop.   Pulmonary:      Effort: Pulmonary effort is normal. No respiratory distress.      Breath sounds: Normal breath sounds.   Abdominal:       Tenderness: There is no abdominal tenderness.   Musculoskeletal:      Cervical back: Normal range of motion.   Skin:     General: Skin is warm and dry.         LABORATORY AND IMAGING STUDIES:  09/17/2022  WBC 9.9  Hgb 13.1  Plt 216    Na 140  K 3.8  BUN 24  Cr 0.89    09/25/2021  HDL-c 43  LDL-c 100  TG 93    ASSESSMENT AND PLAN:  Laaibah was seen today for establish care.    Diagnoses and all orders for this visit:    Osteoporosis, unspecified osteoporosis type, unspecified pathological fracture presence  Refer for zoledronic acid. Repeat DXA after next dose.  -     Infusion Center Service Request; Future  -     Creatinine, Blood Green Plasma Separator Tube; Future  -     Calcium, Blood Green Plasma Separator Tube; Future    Subconjunctival bleed, bilateral  Artificial tears b.i.d. at home to empirically treat discomfort and minimize rubbing    Central retinal vein occlusion of right eye, unspecified complication status  -     Consult/Referral to Ophthalmology    Major neurocognitive disorder (CMS-HCC)  BPSD are well managed by family. Continue memantine ER 14 daily, escitalopram 20 mg daily, and venlafaxine 37.5 mg daily.  We may be observing delirium from recent illness that is resolving    Other orders  -     sodium chloride 0.9 % bolus 250 mL  -     zoledronic acid (RECLAST) 5 MG/100ML IVPB 5 mg        Advance Care Planning   Advance Care Plan and/or Health Care Agent: already on file and current.       I spent a total of 70 minutes in face-to-face and non-face-to-face activities related to the patient's visit, excluding separately reportable services and procedures.    Return in about 3 months (around 02/10/2023).      Electronically signed by:  Gwendlyn Deutscher, MD, MPH  Geriatric Medicine Attending        ** PREVENTIVE CARE **  Immunization History   Administered Date(s) Administered    (Shingles) Herpes Zoster Vaccine (ZOSTAVAX) 10/26/2006    COVID-19 (Pfizer) [employer]  Vaccine >= 12 Years 06/04/2021     COVID-19 Proofreader) Wallace Cullens Cap/Gray Label Vaccine, >=12 YEARS 2023-24 07/08/2022    COVID-19 [employer]) Purple Cap >= 12 Years 08/10/2019, 08/31/2019, 04/23/2020    Influenza Vaccine (High Dose) >=65 Years 04/29/2016, 03/28/2017, 05/08/2018    Influenza Vaccine (High Dose) Quadrivalent >=65 Years 05/25/2008, 04/08/2009, 04/03/2012, 03/13/2019, 05/30/2020, 06/04/2021, 05/04/2022    Influenza Vaccine (Unspecified) 05/25/2008, 04/08/2009, 04/03/2012    Pneumococcal 13 Vaccine (PREVNAR-13) 03/28/2017    Pneumococcal 23 Vaccine (PNEUMOVAX-23) 07/16/1996, 07/03/2010    TST, unspecified formulation 04/13/2015, 04/20/2015    Td 07/16/1996    Tdap 02/04/2011    influenza, split (incl. purified surface antigen) 06/03/2003, 05/12/2005, 04/19/2006     Immunizations:   COVID-19: Due for 2nd 2023-4 dose   Influenza: Up-to-date   RSV: Due   Zoster: Due for Shingrix   Pneumococcal: Complete   TDaP: Due for Td/Tdap booster     Bone density: 06/08/2018 DXA--LS +0.3, Right femoral neck -3.2, Left distal radius -5.3 (GE Lunar)    Cancer screening:   Colorectal - Discontinue screening   Breast - Discontinue screening      Health Maintenance   Topic Date Due    Osteoporosis Screening  Never done    Shingles Vaccine (2 of 3) 12/21/2006    Tetanus (2 - Td or Tdap) 02/03/2021    Medicare Annual Wellness Visit  Never done    COVID-19 Vaccine (6 - 2023-24 season) 11/06/2022    Advance Care Planning  11/10/2023    PHQ2 depression screen  11/10/2023    CC Annual Fall Risk Screen  11/10/2023    Influenza  Completed    Pneumococcal Vaccine  Completed    IMM_Hep A Vaccine Series  Aged Out    HPV Vaccine <= 26 Yrs  Aged Out    Meningococcal MCV4 Vaccine  Aged Out

## 2022-11-11 ENCOUNTER — Telehealth (HOSPITAL_BASED_OUTPATIENT_CLINIC_OR_DEPARTMENT_OTHER): Payer: Self-pay

## 2022-11-11 DIAGNOSIS — F0390 Unspecified dementia without behavioral disturbance: Secondary | ICD-10-CM | POA: Insufficient documentation

## 2022-11-11 NOTE — Telephone Encounter (Signed)
Returned [employer], was requesting to schedule RECLAST for our ECC office. Scheduled infusion for 6/28 and pre labs for 6/24.

## 2022-11-11 NOTE — Telephone Encounter (Signed)
Spoke to patients daughter [name], she will call us back to schedule Reclast.    Ref# [id number]

## 2022-11-11 NOTE — Telephone Encounter (Signed)
5/23 1:47PM    Patient's daughter called the scheduling line and l/m stating they would like to schedule RECLAST. Please call and follow up with the patient.

## 2022-12-13 ENCOUNTER — Other Ambulatory Visit: Payer: PPO

## 2022-12-13 NOTE — Telephone Encounter (Signed)
VOICE MESSAGE RECEIVED ON 6/24 @ 0840:    Message received from pt's daughter requesting to cancel pt's RECLAST infusion on 6/28.    Thanks,  [employer]

## 2022-12-13 NOTE — Telephone Encounter (Addendum)
Returned Lisa's call and she stated that pt is not doing very well and they will call to reschedule the appt.     Thanks,  [employer]

## 2022-12-17 ENCOUNTER — Ambulatory Visit: Payer: PPO

## 2023-01-20 ENCOUNTER — Encounter (INDEPENDENT_AMBULATORY_CARE_PROVIDER_SITE_OTHER): Payer: Self-pay | Admitting: Student in an Organized Health Care Education/Training Program

## 2023-01-20 ENCOUNTER — Telehealth (INDEPENDENT_AMBULATORY_CARE_PROVIDER_SITE_OTHER): Payer: PPO | Admitting: Student in an Organized Health Care Education/Training Program

## 2023-01-20 DIAGNOSIS — F0392 Unspecified dementia, unspecified severity, with psychotic disturbance (CMS-HCC): Secondary | ICD-10-CM

## 2023-01-20 MED ORDER — QUETIAPINE FUMARATE 25 MG OR TABS
12.5000 mg | ORAL_TABLET | Freq: Every morning | ORAL | 0 refills | Status: DC
Start: 2023-01-20 — End: 2023-02-11

## 2023-01-20 NOTE — Telephone Encounter (Signed)
From: Angela Warner  To: Gwendlyn Deutscher, MD, MPH  Sent: 01/20/2023 10:41 AM PDT  Subject: Blood Draw for Angela Warner    Hi Dr Shayne Alken, [facility] has a mobile blood draw option that I can afford for Mom. Their address is [address] [place], [place] [address]. is it possible to send her orders to them?

## 2023-01-20 NOTE — Progress Notes (Signed)
[facility] Telemedicine MyChart Video Visit      ---------------------(data below generated by Gwendlyn Deutscher, MD, MPH)--------------------    Patient Verification & Telemedicine Consent:    I am proceeding with this evaluation at the direct request of the patient.  I have verified this is the correct patient and have obtained verbal consent and written consent from the patient/ surrogate to perform this voluntary telemedicine evaluation (including obtaining history, performing examination and reviewing data provided by the patient).   The patient/ surrogate has the right to refuse this evaluation.  I have explained risks (including potential loss of confidentiality), benefits, alternatives, and the potential need for subsequent face to face care. Patient/ surrogate understands that there is a risk of medical inaccuracies given that our recommendations will be made based on reported data (and we must therefore assume this information is accurate).  Knowing that there is a risk that this information is not reported accurately, and that the telemedicine video, audio, or data feed may be incomplete, the patient agrees to proceed with evaluation and holds [place] harmless knowing these risks. In this evaluation, we will be providing recommendations only. The patient/ surrogate has been notified that other healthcare professionals (including students, residents and Engineer, maintenance) may be involved in this audio-video evaluation.   All laws concerning confidentiality and patient access to medical records and copies of medical records apply to telemedicine.  The patient/ surrogate has received the [place] Notice of Privacy Practices.  I have reviewed this above verification and consent paragraph with the patient/ surrogate.  If the patient is not capacitated to understand the above, and no surrogate is available, since this is not an emergency evaluation, the visit will be rescheduled until such time that the patient can consent, or the  surrogate is available to consent.    Demographics:   Medical Record #: [id number]   Date: January 20, 2023   Patient Name: Angela Warner   DOB: 1928/09/17  Age: 89 year old  Sex: female  Location: Home address on file    Evaluator(s):   Angela Warner was evaluated by me today.    Clinic Location: [facility] [address]   [facility] [place] [facility]  [address]  [place] [address]      Telemedicine Video Visit Telemedicine Appointment:    Angela Warner is a 89 year old female who is attending a virtual based visit.    Patient consent was acquired.    Patient has had more hallucinations and delusions. She sleeps a lot. Bedtime is 9 pm. She wakes at 4 am.  She sees bugs in the food and interacts with children. Hallucinations are more prominent in the day. Family feel that psychosis is worse than before. Sometimes she is distressed by hallucinations.  Family will try 1 month of respite in [place] in [place].  Patient does not complain of physical symptoms.  Memantine was stopped one month ago.      Review of Systems   Unable to perform ROS: Dementia         Past Medical History:  Patient Active Problem List   Diagnosis    Osteoporosis, unspecified osteoporosis type, unspecified pathological fracture presence    Central retinal vein occlusion with macular edema of right eye    Major neurocognitive disorder (CMS-HCC)       Medications were reviewed and updated on the Active Medication List     Psychosocial:  Social History  Socioeconomic History    Marital status: Divorced   Tobacco Use    Smoking status: Never     Passive exposure: Never    Smokeless tobacco: Never   Substance and Sexual Activity    Alcohol use: Not Currently    Drug use: Never    Sexual activity: Not Currently     Social Determinants of Health     Food Insecurity: No Food Insecurity (01/18/2023)    Hunger Vital Sign     Worried About Running Out of Food in the Last Year: Never true     Ran Out  of Food in the Last Year: Never true   Transportation Needs: No Transportation Needs (01/18/2023)    PRAPARE - Therapist, art (Medical): No     Lack of Transportation (Non-Medical): No    Received from [employer]    Social Connections   Housing Stability: Low Risk  (01/18/2023)    Housing Stability Vital Sign     Unable to Pay for Housing in the Last Year: No     Number of Places Lived in the Last Year: 2     Unstable Housing in the Last Year: No        Allergies   Allergen Reactions    Cephalosporins Hives    Gluten Meal Unspecified    Iodine Hives    Lactose Unspecified    Lac Bovis Unspecified    Shellfish Allergy Unspecified, Rash and Hives     Hives and swelling       The Family History:  No family history on file.    Physical Exam:  GENERAL: alert, no distress    Studies  09/17/2022  Hgb 13.1  Plt 216  WBC 9.9    Na 140, K 3.8, BUN 24, Cr 0.89    Imaging: None new    ASSESSMENT AND PLAN  Angela Warner is a 89 year old female who was provided virtual visit with video visit care delivery.   Angela Warner was seen today for hallucinations, delusions, medication review and other.    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 Watch for sedation.  Labs to rule out acute illness and to screen for TB in anticipation of RCFE entry.  Agree with DC memantine because of diminished benefit and potential neuropsychiatric activation.  -     QUEtiapine (SEROQUEL) 25 MG tablet; Take 0.5 tablets (12.5 mg) by mouth every morning.  -     CBC w/ Diff Lavender; Future  -     Comprehensive Metabolic Panel (CMP); Future  -     Urinalysis with Culture Reflex, when indicated; Future  -     Quantiferon-TB, Blood Quantiferon Tube Set; Future    The plan was carefully reviewed verbally with the patient and also affirmed that the patient understood next steps and follow up plan.    Orders this encounter:  Lab   Orders Placed  This Encounter   Procedures    CBC w/ Diff Lavender    Comprehensive Metabolic Panel (CMP)    Urinalysis with Culture Reflex, when indicated    Quantiferon-TB, Blood Quantiferon Tube Set     Imaging No orders of the defined types were placed in this encounter.    Procedures No orders of the defined types were placed in this encounter.    Other No orders of the defined types  were placed in this encounter.      RETURN TO CLINIC INSTRUCTIONS  Future Appointments   Date Time Provider Department Center   02/15/2023  4:00 PM Angela Warner, Janell Quiet, MD, MPH [facility]     I personally spent total 35 minutes in face-to-face and non-face-to-face activities  related to the patient's visit today, excluding any separately reportable  services/procedures    Gwendlyn Deutscher, MD, MPH  Geriatric Medicine Attending

## 2023-01-20 NOTE — Telephone Encounter (Signed)
Lab order information sent to Pt via [facility]. Closing encounter.

## 2023-01-20 NOTE — Addendum Note (Signed)
Addended by: Ura Yingling P on: 01/20/2023 11:18 AM     Modules accepted: Orders

## 2023-01-25 ENCOUNTER — Encounter (INDEPENDENT_AMBULATORY_CARE_PROVIDER_SITE_OTHER): Payer: Self-pay | Admitting: Student in an Organized Health Care Education/Training Program

## 2023-01-26 ENCOUNTER — Encounter (INDEPENDENT_AMBULATORY_CARE_PROVIDER_SITE_OTHER): Payer: Self-pay | Admitting: Student in an Organized Health Care Education/Training Program

## 2023-01-26 NOTE — Telephone Encounter (Signed)
From: Romie Jumper  To: Gwendlyn Deutscher, MD, MPH  Sent: 01/26/2023 11:11 AM PDT  Subject: Paz's new medication    Hi Dr. Gearldine Bienenstock and I are wondering when the new medication will kick in. Is it in a few weeks, like other anti-depressants, or is it supposed to be working now? Since she started, we've had numerous times where she's tried to get out of bed by herself because of hallucinations and delusions. She can not take a short walk without assistance, but now, since starting the medication, she also is not able to move her legs normally to walk. She has been losing this ability for a while, but it is now full blown. She is able to take a few steps, but lumbers like Frankenstein, no knee movement. She is deteriorating physically very quickly now and at this rate she will be wheelchair bound in a matter of days. Could this be due to the medication?   Should we take her off of it? Should we increase her dose? We are in a tough place right now.

## 2023-01-26 NOTE — Telephone Encounter (Signed)
MyChart message forwarded to PCP for review/advice.

## 2023-01-31 ENCOUNTER — Encounter (INDEPENDENT_AMBULATORY_CARE_PROVIDER_SITE_OTHER): Payer: Self-pay | Admitting: Student in an Organized Health Care Education/Training Program

## 2023-02-01 ENCOUNTER — Encounter (INDEPENDENT_AMBULATORY_CARE_PROVIDER_SITE_OTHER): Payer: Self-pay | Admitting: Student in an Organized Health Care Education/Training Program

## 2023-02-01 NOTE — Telephone Encounter (Signed)
From: Angela Warner  To: Gwendlyn Deutscher, MD, MPH  Sent: 01/31/2023 11:05 AM PDT  Subject: Documents to be filled out by Dr. Shayne Alken for Angela Warner    Hi Dr. Shayne Alken, I've attached the POLST and Physicians Report that need to be filled out for mom's admittance to [facility]. I have partially filled in these forms for you. This is for a 5 week respite stay, but we are 99% sure she will be there for the duration. Thanks, Donalee Citrin & Dillard Cannon

## 2023-02-03 NOTE — Telephone Encounter (Signed)
Signed forms faxed to Pt's [facility] and mailed to Pt's home address at Lisa's request. Closing encounter.

## 2023-02-10 ENCOUNTER — Encounter (INDEPENDENT_AMBULATORY_CARE_PROVIDER_SITE_OTHER): Payer: Self-pay | Admitting: Student in an Organized Health Care Education/Training Program

## 2023-02-10 DIAGNOSIS — F0392 Unspecified dementia, unspecified severity, with psychotic disturbance (CMS-HCC): Secondary | ICD-10-CM

## 2023-02-10 NOTE — Telephone Encounter (Signed)
I printed out Medication is and into Dr. Mellody Life folder to sign.

## 2023-02-10 NOTE — Telephone Encounter (Signed)
From: Romie Jumper  To: Gwendlyn Deutscher, MD, MPH  Sent: 02/10/2023 2:49 PM PDT  Subject: Medication list needed for [facility] admission    Hi Dr. [facility] also needs a medication list. I have attached a list of her current prescription and over the counter medications. Her new admission date is this coming Tuesday, August 27th.  Thank you!!!  Donalee Citrin (daughter)

## 2023-02-11 MED ORDER — REFRESH 1.4-0.6 % OP SOLN: 2.00 [drp] | Freq: Two times a day (BID) | OPHTHALMIC | Status: AC

## 2023-02-11 MED ORDER — LUTEIN 20 MG PO TABS: 20.00 mg | ORAL_TABLET | Freq: Every day | ORAL | Status: AC

## 2023-02-11 MED ORDER — QUETIAPINE FUMARATE 25 MG OR TABS
25.0000 mg | ORAL_TABLET | Freq: Every morning | ORAL | 0 refills | Status: AC
Start: 2023-02-11 — End: ?

## 2023-02-14 ENCOUNTER — Encounter (INDEPENDENT_AMBULATORY_CARE_PROVIDER_SITE_OTHER): Payer: Self-pay | Admitting: Student in an Organized Health Care Education/Training Program

## 2023-02-15 ENCOUNTER — Encounter (INDEPENDENT_AMBULATORY_CARE_PROVIDER_SITE_OTHER): Payer: PPO | Admitting: Student in an Organized Health Care Education/Training Program

## 2023-02-16 ENCOUNTER — Encounter (INDEPENDENT_AMBULATORY_CARE_PROVIDER_SITE_OTHER): Payer: Self-pay | Admitting: Student in an Organized Health Care Education/Training Program

## 2023-02-16 DIAGNOSIS — F0392 Unspecified dementia, unspecified severity, with psychotic disturbance (CMS-HCC): Secondary | ICD-10-CM

## 2023-02-16 DIAGNOSIS — F0390 Unspecified dementia without behavioral disturbance: Secondary | ICD-10-CM

## 2023-02-16 DIAGNOSIS — H34811 Central retinal vein occlusion, right eye, with macular edema: Secondary | ICD-10-CM

## 2023-02-17 NOTE — Telephone Encounter (Signed)
Who is calling: Ancillary: Maralyn Sago from [facility]  is calling on behalf of patient  Insurance Coverage Verified: Active- in network  Reason for this call: calling in today to follow up on the pt request for orders and H&P for hospice for this pt. States the family is calling them as well to follow up on if there were sent yet. Please contact Sarah with any questions or concerns. Please fax information to fax # [phone]. She is asking to please fax it today if possible.     Action required by office: Please process request     Duplicate encounter? No previous documentation found on this issue.     Best way to contact: [phone]      Inquiry has been read verbatim to this caller. Verbalizes satisfaction and confirms the above is accurate: yes      Has been advised this message will be transmitted to office and can expect a response within the next 24-72 hours.

## 2023-02-18 NOTE — Telephone Encounter (Signed)
GENERAL INQUIRY:    Caller Information:  Who is calling? Ancillary: sara from [facility] is calling on behalf of patient.  Best way to contact: [phone]  What is the reason for the call? [place] Dec calling to check status on hospice order. Pended order.  Encounter Details:  Is this a duplicate encounter?: No previous documentation found on this issue.  Action required by the office: Please process request  Patient Information:  Is MyChart active?: Yes.  Verification:  Has the inquiry been read verbatim to the caller and verbalizes satisfaction and confirms the above is accurate?: Yes  Caller has been advised this message will be transmitted to the office and can expect a response within the next 24-72 hours during working business days.  Encounter created by Vladimir Crofts from the Care Assist Team.  Signature generated from controlled access password on February 18, 2023 at 9:31 AM.

## 2023-02-22 NOTE — Telephone Encounter (Signed)
Hospice referral and Pt information sent to [facility] via [phone]. Confirmation on file. Family notified via [facility] Closing encounter.

## 2023-02-24 NOTE — Telephone Encounter (Signed)
Faxed Hospice referral. 02/24/23 at 2:48pm. Closing encounter

## 2023-03-01 ENCOUNTER — Encounter (INDEPENDENT_AMBULATORY_CARE_PROVIDER_SITE_OTHER): Payer: Self-pay | Admitting: Internal Medicine

## 2023-03-01 DIAGNOSIS — Z0000 Encounter for general adult medical examination without abnormal findings: Secondary | ICD-10-CM
